# Patient Record
Sex: Female | Born: 1975 | Hispanic: Yes | Smoking: Never smoker
Health system: Southern US, Community
[De-identification: ages and names within clinical notes are randomized; demographics above are authoritative.]

## PROBLEM LIST (undated history)

## (undated) DIAGNOSIS — K219 Gastro-esophageal reflux disease without esophagitis: Secondary | ICD-10-CM

## (undated) DIAGNOSIS — J189 Pneumonia, unspecified organism: Secondary | ICD-10-CM

## (undated) DIAGNOSIS — G4730 Sleep apnea, unspecified: Secondary | ICD-10-CM

## (undated) DIAGNOSIS — J45909 Unspecified asthma, uncomplicated: Secondary | ICD-10-CM

## (undated) DIAGNOSIS — N289 Disorder of kidney and ureter, unspecified: Secondary | ICD-10-CM

## (undated) DIAGNOSIS — S060XAA Concussion with loss of consciousness status unknown, initial encounter: Secondary | ICD-10-CM

## (undated) DIAGNOSIS — Z8489 Family history of other specified conditions: Secondary | ICD-10-CM

## (undated) DIAGNOSIS — S060X9A Concussion with loss of consciousness of unspecified duration, initial encounter: Secondary | ICD-10-CM

## (undated) DIAGNOSIS — F32A Depression, unspecified: Secondary | ICD-10-CM

## (undated) DIAGNOSIS — Z992 Dependence on renal dialysis: Secondary | ICD-10-CM

## (undated) DIAGNOSIS — J449 Chronic obstructive pulmonary disease, unspecified: Secondary | ICD-10-CM

## (undated) DIAGNOSIS — R002 Palpitations: Secondary | ICD-10-CM

## (undated) DIAGNOSIS — T8859XA Other complications of anesthesia, initial encounter: Secondary | ICD-10-CM

## (undated) DIAGNOSIS — I10 Essential (primary) hypertension: Secondary | ICD-10-CM

## (undated) DIAGNOSIS — R519 Headache, unspecified: Secondary | ICD-10-CM

## (undated) DIAGNOSIS — E119 Type 2 diabetes mellitus without complications: Secondary | ICD-10-CM

## (undated) DIAGNOSIS — D649 Anemia, unspecified: Secondary | ICD-10-CM

## (undated) HISTORY — PX: CHOLECYSTECTOMY: SHX55

---

## 2012-01-15 DIAGNOSIS — G43909 Migraine, unspecified, not intractable, without status migrainosus: Secondary | ICD-10-CM | POA: Insufficient documentation

## 2012-02-15 DIAGNOSIS — N200 Calculus of kidney: Secondary | ICD-10-CM | POA: Diagnosis present

## 2012-02-16 DIAGNOSIS — G4730 Sleep apnea, unspecified: Secondary | ICD-10-CM | POA: Insufficient documentation

## 2012-12-26 DIAGNOSIS — N390 Urinary tract infection, site not specified: Secondary | ICD-10-CM | POA: Insufficient documentation

## 2016-02-16 DIAGNOSIS — F331 Major depressive disorder, recurrent, moderate: Secondary | ICD-10-CM | POA: Insufficient documentation

## 2016-09-25 DIAGNOSIS — T7411XA Adult physical abuse, confirmed, initial encounter: Secondary | ICD-10-CM | POA: Insufficient documentation

## 2017-03-15 DIAGNOSIS — E1122 Type 2 diabetes mellitus with diabetic chronic kidney disease: Secondary | ICD-10-CM

## 2017-03-15 DIAGNOSIS — E781 Pure hyperglyceridemia: Secondary | ICD-10-CM | POA: Insufficient documentation

## 2019-12-08 ENCOUNTER — Other Ambulatory Visit: Payer: Self-pay

## 2019-12-08 ENCOUNTER — Emergency Department: Payer: Self-pay

## 2019-12-08 DIAGNOSIS — E1165 Type 2 diabetes mellitus with hyperglycemia: Secondary | ICD-10-CM | POA: Diagnosis present

## 2019-12-08 DIAGNOSIS — G44209 Tension-type headache, unspecified, not intractable: Secondary | ICD-10-CM | POA: Diagnosis present

## 2019-12-08 DIAGNOSIS — R600 Localized edema: Secondary | ICD-10-CM | POA: Diagnosis present

## 2019-12-08 DIAGNOSIS — E1122 Type 2 diabetes mellitus with diabetic chronic kidney disease: Secondary | ICD-10-CM | POA: Diagnosis present

## 2019-12-08 DIAGNOSIS — N179 Acute kidney failure, unspecified: Principal | ICD-10-CM | POA: Diagnosis present

## 2019-12-08 DIAGNOSIS — E872 Acidosis: Secondary | ICD-10-CM | POA: Diagnosis not present

## 2019-12-08 DIAGNOSIS — N1832 Chronic kidney disease, stage 3b: Secondary | ICD-10-CM | POA: Diagnosis present

## 2019-12-08 DIAGNOSIS — Z87442 Personal history of urinary calculi: Secondary | ICD-10-CM

## 2019-12-08 DIAGNOSIS — Z794 Long term (current) use of insulin: Secondary | ICD-10-CM

## 2019-12-08 DIAGNOSIS — E875 Hyperkalemia: Secondary | ICD-10-CM | POA: Diagnosis not present

## 2019-12-08 DIAGNOSIS — J45909 Unspecified asthma, uncomplicated: Secondary | ICD-10-CM | POA: Diagnosis present

## 2019-12-08 DIAGNOSIS — K219 Gastro-esophageal reflux disease without esophagitis: Secondary | ICD-10-CM | POA: Diagnosis present

## 2019-12-08 DIAGNOSIS — Z8249 Family history of ischemic heart disease and other diseases of the circulatory system: Secondary | ICD-10-CM

## 2019-12-08 DIAGNOSIS — Z9114 Patient's other noncompliance with medication regimen: Secondary | ICD-10-CM

## 2019-12-08 DIAGNOSIS — Z79899 Other long term (current) drug therapy: Secondary | ICD-10-CM

## 2019-12-08 DIAGNOSIS — Z9049 Acquired absence of other specified parts of digestive tract: Secondary | ICD-10-CM

## 2019-12-08 DIAGNOSIS — Z888 Allergy status to other drugs, medicaments and biological substances status: Secondary | ICD-10-CM

## 2019-12-08 DIAGNOSIS — I129 Hypertensive chronic kidney disease with stage 1 through stage 4 chronic kidney disease, or unspecified chronic kidney disease: Secondary | ICD-10-CM | POA: Diagnosis present

## 2019-12-08 DIAGNOSIS — Z841 Family history of disorders of kidney and ureter: Secondary | ICD-10-CM

## 2019-12-08 DIAGNOSIS — Z20822 Contact with and (suspected) exposure to covid-19: Secondary | ICD-10-CM | POA: Diagnosis present

## 2019-12-08 DIAGNOSIS — I160 Hypertensive urgency: Secondary | ICD-10-CM | POA: Diagnosis present

## 2019-12-08 DIAGNOSIS — Z6841 Body Mass Index (BMI) 40.0 and over, adult: Secondary | ICD-10-CM

## 2019-12-08 LAB — COMPREHENSIVE METABOLIC PANEL
ALT: 10 U/L (ref 0–44)
AST: 13 U/L — ABNORMAL LOW (ref 15–41)
Albumin: 2.5 g/dL — ABNORMAL LOW (ref 3.5–5.0)
Alkaline Phosphatase: 102 U/L (ref 38–126)
Anion gap: 6 (ref 5–15)
BUN: 22 mg/dL — ABNORMAL HIGH (ref 6–20)
CO2: 17 mmol/L — ABNORMAL LOW (ref 22–32)
Calcium: 7.7 mg/dL — ABNORMAL LOW (ref 8.9–10.3)
Chloride: 111 mmol/L (ref 98–111)
Creatinine, Ser: 3.26 mg/dL — ABNORMAL HIGH (ref 0.44–1.00)
GFR calc Af Amer: 19 mL/min — ABNORMAL LOW (ref >60)
GFR calc non Af Amer: 16 mL/min — ABNORMAL LOW (ref >60)
Glucose, Bld: 251 mg/dL — ABNORMAL HIGH (ref 70–99)
Potassium: 5 mmol/L (ref 3.5–5.1)
Sodium: 134 mmol/L — ABNORMAL LOW (ref 135–145)
Total Bilirubin: 0.7 mg/dL (ref 0.3–1.2)
Total Protein: 5.5 g/dL — ABNORMAL LOW (ref 6.5–8.1)

## 2019-12-08 LAB — CBC
HCT: 35.1 % — ABNORMAL LOW (ref 36.0–46.0)
Hemoglobin: 11.5 g/dL — ABNORMAL LOW (ref 12.0–15.0)
MCH: 28.3 pg (ref 26.0–34.0)
MCHC: 32.8 g/dL (ref 30.0–36.0)
MCV: 86.2 fL (ref 80.0–100.0)
Platelets: 320 10*3/uL (ref 150–400)
RBC: 4.07 MIL/uL (ref 3.87–5.11)
RDW: 12.9 % (ref 11.5–15.5)
WBC: 10.2 10*3/uL (ref 4.0–10.5)
nRBC: 0 % (ref 0.0–0.2)

## 2019-12-08 LAB — TROPONIN I (HIGH SENSITIVITY): Troponin I (High Sensitivity): 8 ng/L (ref <18)

## 2019-12-08 MED ORDER — ACETAMINOPHEN 325 MG PO TABS
ORAL_TABLET | ORAL | Status: AC
  Filled 2019-12-08: qty 2

## 2019-12-08 MED ORDER — ACETAMINOPHEN 325 MG PO TABS
650.0000 mg | ORAL_TABLET | Freq: Once | ORAL | Status: AC
  Administered 2019-12-08: 650 mg via ORAL

## 2019-12-08 NOTE — ED Triage Notes (Signed)
Pt states that she has been out of her medicine for the past 2-3 week, headache and vomiting, reports that the vomiting has gotten worse in the past 4 days due to her father dying

## 2019-12-09 ENCOUNTER — Inpatient Hospital Stay
Admission: EM | Admit: 2019-12-09 | Discharge: 2019-12-13 | DRG: 683 | Disposition: A | Discharge status: Home Health | Payer: Self-pay | Attending: Internal Medicine | Admitting: Internal Medicine

## 2019-12-09 ENCOUNTER — Encounter: Payer: Self-pay | Admitting: Emergency Medicine

## 2019-12-09 DIAGNOSIS — R079 Chest pain, unspecified: Secondary | ICD-10-CM | POA: Diagnosis present

## 2019-12-09 DIAGNOSIS — E66813 Obesity, class 3: Secondary | ICD-10-CM | POA: Diagnosis present

## 2019-12-09 DIAGNOSIS — N200 Calculus of kidney: Secondary | ICD-10-CM | POA: Diagnosis present

## 2019-12-09 DIAGNOSIS — I160 Hypertensive urgency: Secondary | ICD-10-CM | POA: Diagnosis present

## 2019-12-09 DIAGNOSIS — N179 Acute kidney failure, unspecified: Secondary | ICD-10-CM

## 2019-12-09 DIAGNOSIS — E1169 Type 2 diabetes mellitus with other specified complication: Secondary | ICD-10-CM

## 2019-12-09 DIAGNOSIS — E1122 Type 2 diabetes mellitus with diabetic chronic kidney disease: Secondary | ICD-10-CM

## 2019-12-09 DIAGNOSIS — R1110 Vomiting, unspecified: Secondary | ICD-10-CM

## 2019-12-09 HISTORY — DX: Unspecified asthma, uncomplicated: J45.909

## 2019-12-09 HISTORY — DX: Essential (primary) hypertension: I10

## 2019-12-09 HISTORY — DX: Type 2 diabetes mellitus without complications: E11.9

## 2019-12-09 LAB — SARS CORONAVIRUS 2 BY RT PCR (HOSPITAL ORDER, PERFORMED IN ~~LOC~~ HOSPITAL LAB): SARS Coronavirus 2: NEGATIVE

## 2019-12-09 LAB — GLUCOSE, CAPILLARY: Glucose-Capillary: 181 mg/dL — ABNORMAL HIGH (ref 70–99)

## 2019-12-09 LAB — TROPONIN I (HIGH SENSITIVITY): Troponin I (High Sensitivity): 7 ng/L (ref <18)

## 2019-12-09 MED ORDER — INSULIN ASPART 100 UNIT/ML ~~LOC~~ SOLN
0.0000 [IU] | Freq: Three times a day (TID) | SUBCUTANEOUS | Status: DC
  Administered 2019-12-10 (×3): 4 [IU] via SUBCUTANEOUS
  Administered 2019-12-11: 11 [IU] via SUBCUTANEOUS
  Administered 2019-12-11 (×2): 7 [IU] via SUBCUTANEOUS
  Administered 2019-12-12: 4 [IU] via SUBCUTANEOUS
  Administered 2019-12-12: 11 [IU] via SUBCUTANEOUS
  Administered 2019-12-12: 7 [IU] via SUBCUTANEOUS
  Filled 2019-12-09 (×9): qty 1

## 2019-12-09 MED ORDER — HYDRALAZINE HCL 20 MG/ML IJ SOLN
10.0000 mg | Freq: Four times a day (QID) | INTRAMUSCULAR | Status: DC | PRN
  Administered 2019-12-09 – 2019-12-11 (×4): 10 mg via INTRAVENOUS
  Filled 2019-12-09 (×3): qty 0.5
  Filled 2019-12-09: qty 1
  Filled 2019-12-09 (×2): qty 0.5

## 2019-12-09 MED ORDER — SODIUM CHLORIDE 0.9% FLUSH: 3.0000 mL | INTRAVENOUS | Status: DC | PRN

## 2019-12-09 MED ORDER — ONDANSETRON HCL 4 MG/2ML IJ SOLN
4.0000 mg | Freq: Four times a day (QID) | INTRAMUSCULAR | Status: DC | PRN
  Administered 2019-12-10 (×2): 4 mg via INTRAVENOUS
  Filled 2019-12-09 (×2): qty 2

## 2019-12-09 MED ORDER — SODIUM CHLORIDE 0.9 % IV SOLN: 250.0000 mL | INTRAVENOUS | Status: DC | PRN

## 2019-12-09 MED ORDER — LORAZEPAM 2 MG/ML IJ SOLN
1.0000 mg | Freq: Once | INTRAMUSCULAR | Status: AC
  Administered 2019-12-09: 1 mg via INTRAVENOUS
  Filled 2019-12-09: qty 1

## 2019-12-09 MED ORDER — ACETAMINOPHEN 500 MG PO TABS
1000.0000 mg | ORAL_TABLET | ORAL | Status: DC | PRN
  Administered 2019-12-09 – 2019-12-11 (×7): 1000 mg via ORAL
  Filled 2019-12-09 (×8): qty 2

## 2019-12-09 MED ORDER — LACTATED RINGERS IV BOLUS
1000.0000 mL | Freq: Once | INTRAVENOUS | Status: AC
  Administered 2019-12-09: 1000 mL via INTRAVENOUS

## 2019-12-09 MED ORDER — SODIUM CHLORIDE 0.9% FLUSH
3.0000 mL | Freq: Two times a day (BID) | INTRAVENOUS | Status: DC
  Administered 2019-12-10 – 2019-12-12 (×5): 3 mL via INTRAVENOUS

## 2019-12-09 MED ORDER — AMLODIPINE BESYLATE 10 MG PO TABS
10.0000 mg | ORAL_TABLET | Freq: Every day | ORAL | Status: DC
  Administered 2019-12-09 – 2019-12-13 (×5): 10 mg via ORAL
  Filled 2019-12-09: qty 1
  Filled 2019-12-09: qty 2
  Filled 2019-12-09 (×3): qty 1

## 2019-12-09 MED ORDER — ALBUTEROL SULFATE (2.5 MG/3ML) 0.083% IN NEBU
2.5000 mg | INHALATION_SOLUTION | Freq: Once | RESPIRATORY_TRACT | Status: AC
  Administered 2019-12-09: 2.5 mg via RESPIRATORY_TRACT
  Filled 2019-12-09: qty 3

## 2019-12-09 MED ORDER — ONDANSETRON HCL 4 MG PO TABS: 4.0000 mg | ORAL_TABLET | Freq: Four times a day (QID) | ORAL | Status: DC | PRN

## 2019-12-09 MED ORDER — ENOXAPARIN SODIUM 30 MG/0.3ML ~~LOC~~ SOLN
30.0000 mg | SUBCUTANEOUS | Status: DC
  Filled 2019-12-09 (×2): qty 0.3

## 2019-12-09 MED ORDER — PANTOPRAZOLE SODIUM 40 MG IV SOLR
40.0000 mg | INTRAVENOUS | Status: DC
  Administered 2019-12-09 – 2019-12-12 (×4): 40 mg via INTRAVENOUS
  Filled 2019-12-09 (×4): qty 40

## 2019-12-09 MED ORDER — SODIUM CHLORIDE 0.45 % IV SOLN: INTRAVENOUS | Status: DC

## 2019-12-09 NOTE — ED Notes (Signed)
Patient given apple juice

## 2019-12-09 NOTE — Plan of Care (Signed)

## 2019-12-09 NOTE — H&P (Signed)
History and Physical    [name] OZH:[id number] DOB: December 12, 1975 DOA: 12/09/2019  PCP: Patient, No Pcp Per   Patient coming from: Home  I have personally briefly reviewed patient's old medical records in [place]  Chief Complaint: Headache/chest pain/nausea/vomiting  HPI: [name] is a 44 y.o. female with medical history significant for diabetes mellitus with chronic kidney disease, morbid obesity, diabetes mellitus, hypertension and asthma who presents to the emergency room for evaluation of headache and vomiting which she has had according to her for months.  She describes the headache as an intense pain over the temporal and frontal part of her skull and radiating to the eyes which she rates a 6 x 10 in intensity at its worst. She denies having a history of migraine headaches and states that the pain has improved since she came into the emergency room..  She has not had her vision checked.  Patient admits to a lot of stress at home and has not been taking any of her medications.  She also complains of burning sensation in her chest associated with nausea and multiple episodes of vomiting.  She denies having any abdominal pain or any changes in her bowel habits. She has no fever, no chills, no shortness of breath, no cough, no urinary symptoms. Upon arrival to the ER she was noted to have significantly elevated blood pressure 168/107. Labs reveal a serum creatinine of 3.26 compared to baseline of 1.9 in January 2021, serum glucose of 251, bicarbonate level of 17. CT scan of the head done without contrast shows no acute findings Twelve-lead EKG read by me shows normal sinus rhythm, low voltage QRS and left axis deviation Chest x-ray read by me shows no infiltrate or effusion.    ED Course: Patient is a 44 year old Hispanic female who presents to the emergency room with multiple complaints but will be admitted to the hospitalist service for acute kidney injury.  Patient at  baseline has a serum creatinine of 1.9 but today on admission it is 3.26. She will be admitted to the hospital for further evaluation.  Review of Systems: As per HPI otherwise 10 point review of systems negative.    Past Medical History:  Diagnosis Date  . Asthma   . Diabetes ([place])   . Hypertension     History reviewed. No pertinent surgical history.   has no history on file for tobacco use, alcohol use, and drug use.  Allergies  Allergen Reactions  . Cholesterol Rash    No family history on file.   Prior to Admission medications   Not on File    Physical Exam: Vitals:   12/08/19 2213 12/09/19 0359 12/09/19 0626 12/09/19 0949  BP: (!) 184/122 (!) 209/103 113/73 (!) 165/106  Pulse: 86 77 64 85  Resp: 18 18 18 16   Temp: 98.6 F (37 C) 98.6 F (37 C) 98.5 F (36.9 C)   TempSrc: Oral Oral Oral   SpO2: 100% 100% 100% 100%  Weight:      Height:         Vitals:   12/08/19 2213 12/09/19 0359 12/09/19 0626 12/09/19 0949  BP: (!) 184/122 (!) 209/103 113/73 (!) 165/106  Pulse: 86 77 64 85  Resp: 18 18 18 16   Temp: 98.6 F (37 C) 98.6 F (37 C) 98.5 F (36.9 C)   TempSrc: Oral Oral Oral   SpO2: 100% 100% 100% 100%  Weight:      Height:  Constitutional: NAD, alert and oriented x 3.  Morbidly obese  Eyes: PERRL, lids and conjunctivae normal ENMT: Mucous membranes are moist.  Neck: normal, supple, no masses, no thyromegaly Respiratory: clear to auscultation bilaterally, no wheezing, no crackles. Normal respiratory effort. No accessory muscle use.  Cardiovascular: Regular rate and rhythm, no murmurs / rubs / gallops. No extremity edema. 2+ pedal pulses. No carotid bruits.  Abdomen: no tenderness, no masses palpated. No hepatosplenomegaly. Bowel sounds positive. Central adiposity Musculoskeletal: no clubbing / cyanosis. No joint deformity upper and lower extremities.  Skin: no rashes, lesions, ulcers.  Neurologic: No gross focal neurologic  deficit. Psychiatric: Normal mood and affect.   Labs on Admission: I have personally reviewed following labs and imaging studies  CBC: Recent Labs  Lab 12/08/19 1905  WBC 10.2  HGB 11.5*  HCT 35.1*  MCV 86.2  PLT 130   Basic Metabolic Panel: Recent Labs  Lab 12/08/19 1905  NA 134*  K 5.0  CL 111  CO2 17*  GLUCOSE 251*  BUN 22*  CREATININE 3.26*  CALCIUM 7.7*   GFR: Estimated Creatinine Clearance: 28.8 mL/min (A) (by C-G formula based on SCr of 3.26 mg/dL (H)). Liver Function Tests: Recent Labs  Lab 12/08/19 1905  AST 13*  ALT 10  ALKPHOS 102  BILITOT 0.7  PROT 5.5*  ALBUMIN 2.5*   No results for input(s): LIPASE, AMYLASE in the last 168 hours. No results for input(s): AMMONIA in the last 168 hours. Coagulation Profile: No results for input(s): INR, PROTIME in the last 168 hours. Cardiac Enzymes: No results for input(s): CKTOTAL, CKMB, CKMBINDEX, TROPONINI in the last 168 hours. BNP (last 3 results) No results for input(s): PROBNP in the last 8760 hours. HbA1C: No results for input(s): HGBA1C in the last 72 hours. CBG: No results for input(s): GLUCAP in the last 168 hours. Lipid Profile: No results for input(s): CHOL, HDL, LDLCALC, TRIG, CHOLHDL, LDLDIRECT in the last 72 hours. Thyroid Function Tests: No results for input(s): TSH, T4TOTAL, FREET4, T3FREE, THYROIDAB in the last 72 hours. Anemia Panel: No results for input(s): VITAMINB12, FOLATE, FERRITIN, TIBC, IRON, RETICCTPCT in the last 72 hours. Urine analysis: No results found for: COLORURINE, APPEARANCEUR, LABSPEC, [place], GLUCOSEU, HGBUR, BILIRUBINUR, KETONESUR, PROTEINUR, UROBILINOGEN, NITRITE, LEUKOCYTESUR  Radiological Exams on Admission: DG Chest 2 View  Result Date: 12/08/2019 CLINICAL DATA:  44 year old female with hypertension and headaches. EXAM: CHEST - 2 VIEW COMPARISON:  None. FINDINGS: No focal consolidation, pleural effusion, or pneumothorax. The cardiac silhouette is within limits.  No acute osseous pathology. IMPRESSION: No active cardiopulmonary disease. Electronically Signed   By: Anner Crete M.D.   On: 12/08/2019 23:53   CT Head Wo Contrast  Result Date: 12/08/2019 CLINICAL DATA:  44 year old female with dizziness. EXAM: CT HEAD WITHOUT CONTRAST TECHNIQUE: Contiguous axial images were obtained from the base of the skull through the vertex without intravenous contrast. COMPARISON:  None. FINDINGS: Brain: The ventricles and sulci appropriate size for patient's age. The gray-white matter discrimination is preserved. There is no acute intracranial hemorrhage. No mass effect or midline shift. No extra-axial fluid collection. Vascular: No hyperdense vessel or unexpected calcification. Skull: Normal. Negative for fracture or focal lesion. Sinuses/Orbits: No acute finding. Other: None IMPRESSION: Unremarkable noncontrast CT of the brain. Electronically Signed   By: Anner Crete M.D.   On: 12/08/2019 23:38    EKG: Independently reviewed.  Normal sinus rhythm Low voltage QRS Left axis deviation  Assessment/Plan Principal Problem:   AKI (acute kidney injury) ([place]) Active  Problems:   Obesity, Class III, BMI 40-49.9 (morbid obesity) ([place])   Diabetes ([place])   Hypertensive urgency   Chest pain     Acute kidney injury Patient with a history of diabetes mellitus and chronic kidney disease, baseline serum creatinine of 1.9 presents to the ER for evaluation of nausea, vomiting and chest pain and is noted to have a serum creatinine of 3.26 Worsening renal function appears to be prerenal and may be secondary to volume depletion We will hydrate patient Repeat renal parameters in a.m.   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 Patient has a baseline  serum creatinine of 1.9 but has been noncompliant with medication as well as her diet Place patient on a consistent carbohydrate diet Glycemic control with sliding scale insulin   Morbid obesity (BMI 56) Complicates overall prognosis and care   Chest pain Most likely atypical and could be related to GERD Obtain serial cardiac enzymes to rule out an acute coronary syndrome Place patient on IV Protonix Start patient on a clear liquid diet and advance as tolerated  DVT prophylaxis:  Lovenox Code Status: Full Family Communication: Greater than 50% of time was spent discussing plan of care with patient at the bedside.  All questions and concerns have been addressed.  She verbalizes understanding and agrees with the plan. Disposition Plan: Back to previous home environment Consults called: None    Glennon Kopko MD [facility]     12/09/2019, 10:24 AM

## 2019-12-09 NOTE — ED Notes (Signed)
MD notified about continued HTN, no new orders at this time.

## 2019-12-09 NOTE — ED Notes (Signed)
Pt up to check-in desk asking about medicine. This Probation officer explained that she would have to wait to go back to a treatment room and for a doctors order.

## 2019-12-09 NOTE — ED Provider Notes (Signed)
[facility] Emergency Department Provider Note   ____________________________________________   First MD Initiated Contact with Patient 12/09/19 0700     (approximate)  I have reviewed the triage vital signs and the nursing notes.   HISTORY  Chief Complaint Headache, Emesis, and Hypertension    HPI [name] is a 44 y.o. female with past medical history of hypertension, diabetes, and asthma who presents to the ED complaining of headache and vomiting.  Patient reports she has been dealing with an intermittent headache for the past couple of weeks which has been severe yesterday into today.  She describes it as diffuse and dull, not exacerbated or alleviated by anything in particular.  It has been gradually worsening in severity and she denies any vision changes, speech changes, numbness, or weakness.  She has not had any fevers or neck stiffness.  She does state that she developed sharp pain in the left side of her chest over the past 3 to 4 days with some increasing difficulty breathing.  She has been using her inhaler but this is not alleviated her symptoms and she denies any associated cough.  She has been nauseous with multiple episodes of vomiting, but denies abdominal pain or diarrhea.  She has not had any dysuria, hematuria, vaginal bleeding, or vaginal discharge.        Past Medical History:  Diagnosis Date  . Asthma   . Diabetes ([place])   . Hypertension     Patient Active Problem List   Diagnosis Date Noted  . AKI (acute kidney injury) ([place]) 12/09/2019    History reviewed. No pertinent surgical history.  Prior to Admission medications   Not on File    Allergies Cholesterol  No family history on file.  Social History Social History   Tobacco Use  . Smoking status: Not on file  Substance Use Topics  . Alcohol use: Not on file  . Drug use: Not on file    Review of Systems  Constitutional: No fever/chills Eyes: No visual  changes. ENT: No sore throat. Cardiovascular: Positive for chest pain. Respiratory: Positive for shortness of breath. Gastrointestinal: No abdominal pain.  Positive for nausea and vomiting.  No diarrhea.  No constipation. Genitourinary: Negative for dysuria. Musculoskeletal: Negative for back pain. Skin: Negative for rash. Neurological: Negative for headaches, focal weakness or numbness.  ____________________________________________   PHYSICAL EXAM:  VITAL SIGNS: ED Triage Vitals  Enc Vitals Group     BP 12/08/19 1846 (!) 168/107     Pulse Rate 12/08/19 1846 88     Resp 12/08/19 1846 18     Temp 12/08/19 1846 98.7 F (37.1 C)     Temp Source 12/08/19 1846 Oral     SpO2 12/08/19 1846 99 %     Weight 12/08/19 1900 (!) 298 lb (135.2 kg)     Height 12/08/19 1900 5\' 1"  (1.549 m)     Head Circumference --      Peak Flow --      Pain Score 12/08/19 1900 9     Pain Loc --      Pain Edu? --      Excl. in [place]? --     Constitutional: Alert and oriented. Eyes: Conjunctivae are normal. Head: Atraumatic. Nose: No congestion/rhinnorhea. Mouth/Throat: Mucous membranes are moist. Neck: Normal ROM Cardiovascular: Normal rate, regular rhythm. Grossly normal heart sounds. Respiratory: Normal respiratory effort.  No retractions. Lungs CTAB. Gastrointestinal: Soft and nontender. No distention. Genitourinary: deferred Musculoskeletal: No lower extremity  tenderness, 1+ pitting edema to bilateral lower extremities. Neurologic:  Normal speech and language. No gross focal neurologic deficits are appreciated. Skin:  Skin is warm, dry and intact. No rash noted. Psychiatric: Mood and affect are normal. Speech and behavior are normal.  ____________________________________________   LABS (all labs ordered are listed, but only abnormal results are displayed)  Labs Reviewed  CBC - Abnormal; Notable for the following components:      Result Value   Hemoglobin 11.5 (*)    HCT 35.1 (*)    All  other components within normal limits  COMPREHENSIVE METABOLIC PANEL - Abnormal; Notable for the following components:   Sodium 134 (*)    CO2 17 (*)    Glucose, Bld 251 (*)    BUN 22 (*)    Creatinine, Ser 3.26 (*)    Calcium 7.7 (*)    Total Protein 5.5 (*)    Albumin 2.5 (*)    AST 13 (*)    GFR calc non Af Amer 16 (*)    GFR calc Af Amer 19 (*)    All other components within normal limits  SARS CORONAVIRUS 2 BY RT PCR ([facility])  URINALYSIS, COMPLETE (UACMP) WITH MICROSCOPIC  POC URINE PREG, ED  TROPONIN I (HIGH SENSITIVITY)   ____________________________________________  EKG  ED ECG REPORT I, Blake Divine, the attending physician, personally viewed and interpreted this ECG.   Date: 12/09/2019  EKG Time: 18:47  Rate: 80  Rhythm: normal sinus rhythm  Axis: LAD  Intervals:none  ST&T Change: None   PROCEDURES  Procedure(s) performed (including Critical Care):  Procedures   ____________________________________________   INITIAL IMPRESSION / ASSESSMENT AND PLAN / ED COURSE       44 year old female with possible history of hypertension, diabetes, and asthma who presents to the ED complaining of a couple weeks of intermittent headache that has become more severe recently along with 3 to 4 days of chest pain, shortness of breath, and vomiting.  Headache is gradual in onset and CT head is negative for acute process, I do not suspect SAH or meningitis.  Chest pain seems unlikely to be related to ACS, EKG without arrhythmia or acute ischemic changes, troponin negative.  Chest x-ray is also unremarkable, no audible wheezing to suggest significant asthma exacerbation.  She has not had any fevers and denies any sick contacts, however she is not vaccinated against COVID-19 and we will need to test for this.  Lab work is remarkable for elevated creatinine and mild acidosis with unclear baseline renal function.  We will attempt to  obtain records from [facility], hydrate with IV fluids for now.  She is mildly hyperglycemic but no increase in anion gap to suggest DKA.  Patient hydrated with IV fluids, point-of-care urine pregnancy is negative and UA pending.  We will able to obtain records from [facility] that shows creatinine of 1.9 in January.  Patient's current AKI is likely multifactorial due to dehydration from vomiting, noncompliance with her antihypertensives, and poor diabetic control.  She was hydrated with IV fluids and case discussed with hospitalist for admission.      ____________________________________________   FINAL CLINICAL IMPRESSION(S) / ED DIAGNOSES  Final diagnoses:  AKI (acute kidney injury) [facility])     ED Discharge Orders    None       Note:  This document was prepared using Dragon voice recognition software and may include unintentional dictation errors.  Blake Divine, MD 12/09/19 [phone]

## 2019-12-09 NOTE — ED Notes (Signed)
MD notified of pts continued HTN despite medications, awaiting new orders, will continue to monitor.

## 2019-12-10 ENCOUNTER — Inpatient Hospital Stay: Payer: Self-pay

## 2019-12-10 ENCOUNTER — Encounter: Payer: Self-pay | Admitting: Internal Medicine

## 2019-12-10 ENCOUNTER — Other Ambulatory Visit: Payer: Self-pay

## 2019-12-10 DIAGNOSIS — E872 Acidosis: Secondary | ICD-10-CM

## 2019-12-10 DIAGNOSIS — N179 Acute kidney failure, unspecified: Principal | ICD-10-CM

## 2019-12-10 LAB — BASIC METABOLIC PANEL
Anion gap: 7 (ref 5–15)
BUN: 17 mg/dL (ref 6–20)
CO2: 16 mmol/L — ABNORMAL LOW (ref 22–32)
Calcium: 7.9 mg/dL — ABNORMAL LOW (ref 8.9–10.3)
Chloride: 113 mmol/L — ABNORMAL HIGH (ref 98–111)
Creatinine, Ser: 3.03 mg/dL — ABNORMAL HIGH (ref 0.44–1.00)
GFR calc Af Amer: 21 mL/min — ABNORMAL LOW (ref >60)
GFR calc non Af Amer: 18 mL/min — ABNORMAL LOW (ref >60)
Glucose, Bld: 172 mg/dL — ABNORMAL HIGH (ref 70–99)
Potassium: 5 mmol/L (ref 3.5–5.1)
Sodium: 136 mmol/L (ref 135–145)

## 2019-12-10 LAB — GLUCOSE, CAPILLARY
Glucose-Capillary: 168 mg/dL — ABNORMAL HIGH (ref 70–99)
Glucose-Capillary: 166 mg/dL — ABNORMAL HIGH (ref 70–99)
Glucose-Capillary: 181 mg/dL — ABNORMAL HIGH (ref 70–99)
Glucose-Capillary: 190 mg/dL — ABNORMAL HIGH (ref 70–99)
Glucose-Capillary: 285 mg/dL — ABNORMAL HIGH (ref 70–99)

## 2019-12-10 LAB — CBC
HCT: 35 % — ABNORMAL LOW (ref 36.0–46.0)
Hemoglobin: 11.4 g/dL — ABNORMAL LOW (ref 12.0–15.0)
MCH: 28.4 pg (ref 26.0–34.0)
MCHC: 32.6 g/dL (ref 30.0–36.0)
MCV: 87.3 fL (ref 80.0–100.0)
Platelets: 362 10*3/uL (ref 150–400)
RBC: 4.01 MIL/uL (ref 3.87–5.11)
RDW: 13 % (ref 11.5–15.5)
WBC: 9.1 10*3/uL (ref 4.0–10.5)
nRBC: 0 % (ref 0.0–0.2)

## 2019-12-10 LAB — HEMOGLOBIN A1C
Hgb A1c MFr Bld: 10.1 % — ABNORMAL HIGH (ref 4.8–5.6)
Mean Plasma Glucose: 243 mg/dL

## 2019-12-10 LAB — TROPONIN I (HIGH SENSITIVITY): Troponin I (High Sensitivity): 7 ng/L (ref <18)

## 2019-12-10 LAB — HIV ANTIBODY (ROUTINE TESTING W REFLEX): HIV Screen 4th Generation wRfx: NONREACTIVE

## 2019-12-10 MED ORDER — DEXAMETHASONE SODIUM PHOSPHATE 10 MG/ML IJ SOLN
6.0000 mg | Freq: Once | INTRAMUSCULAR | Status: AC
  Administered 2019-12-10: 6 mg via INTRAVENOUS
  Filled 2019-12-10: qty 0.6

## 2019-12-10 MED ORDER — ENOXAPARIN SODIUM 40 MG/0.4ML ~~LOC~~ SOLN
40.0000 mg | Freq: Two times a day (BID) | SUBCUTANEOUS | Status: DC
  Administered 2019-12-10 – 2019-12-12 (×5): 40 mg via SUBCUTANEOUS
  Filled 2019-12-10 (×5): qty 0.4

## 2019-12-10 MED ORDER — MORPHINE SULFATE (PF) 2 MG/ML IV SOLN
1.0000 mg | INTRAVENOUS | Status: DC | PRN
  Administered 2019-12-10 (×2): 1 mg via INTRAVENOUS
  Filled 2019-12-10 (×2): qty 1

## 2019-12-10 MED ORDER — OXYCODONE HCL 5 MG PO TABS
5.0000 mg | ORAL_TABLET | Freq: Four times a day (QID) | ORAL | Status: DC | PRN
  Administered 2019-12-11 – 2019-12-13 (×4): 5 mg via ORAL
  Filled 2019-12-10 (×4): qty 1

## 2019-12-10 MED ORDER — MAGNESIUM SULFATE 2 GM/50ML IV SOLN
2.0000 g | Freq: Once | INTRAVENOUS | Status: DC
  Filled 2019-12-10: qty 50

## 2019-12-10 MED ORDER — LORAZEPAM 2 MG/ML IJ SOLN: 1.0000 mg | Freq: Once | INTRAMUSCULAR | Status: AC

## 2019-12-10 MED ORDER — LIVING WELL WITH DIABETES BOOK
Freq: Once | Status: AC
  Filled 2019-12-10: qty 1

## 2019-12-10 MED ORDER — TOPIRAMATE 25 MG PO TABS
25.0000 mg | ORAL_TABLET | Freq: Every day | ORAL | Status: DC
  Administered 2019-12-10: 25 mg via ORAL
  Filled 2019-12-10: qty 1

## 2019-12-10 MED ORDER — SODIUM CHLORIDE 0.9 % IV SOLN
2.0000 g | Freq: Once | INTRAVENOUS | Status: AC
  Administered 2019-12-10: 2 g via INTRAVENOUS
  Filled 2019-12-10: qty 4

## 2019-12-10 MED ORDER — MORPHINE SULFATE (PF) 2 MG/ML IV SOLN
2.0000 mg | INTRAVENOUS | Status: DC | PRN
  Administered 2019-12-10: 2 mg via INTRAVENOUS
  Filled 2019-12-10: qty 1

## 2019-12-10 MED ORDER — HYDRALAZINE HCL 25 MG PO TABS
25.0000 mg | ORAL_TABLET | Freq: Three times a day (TID) | ORAL | Status: DC
  Administered 2019-12-10 – 2019-12-13 (×9): 25 mg via ORAL
  Filled 2019-12-10 (×9): qty 1

## 2019-12-10 MED ORDER — LORAZEPAM 2 MG/ML IJ SOLN
INTRAMUSCULAR | Status: AC
  Administered 2019-12-11: 1 mg via INTRAVENOUS
  Filled 2019-12-10: qty 1

## 2019-12-10 NOTE — Progress Notes (Signed)
PROGRESS NOTE    [name]  TKZ:[id number] DOB: [birth year] DOA: 12/09/2019 PCP: Patient, No Pcp Per    Brief Narrative:  [name] is a 44 y.o. female with medical history significant for diabetes mellitus with chronic kidney disease, morbid obesity, diabetes mellitus, hypertension and asthma who presents to the emergency room for evaluation of headache and vomiting which she has had according to her for months.  She describes the headache as an intense pain over the temporal and frontal part of her skull and radiating to the eyes which she rates a 6 x 10 in intensity at its worst. She denies having a history of migraine headaches and states that the pain has improved since she came into the emergency room..  She has not had her vision checked.  Patient admits to a lot of stress at home and has not been taking any of her medications.  She also complains of burning sensation in her chest associated with nausea and multiple episodes of vomiting.  She denies having any abdominal pain or any changes in her bowel habits. She has no fever, no chills, no shortness of breath, no cough, no urinary symptoms. Upon arrival to the ER she was noted to have significantly elevated blood pressure 168/107. Labs reveal a serum creatinine of 3.26 compared to baseline of 1.9 in January 2021, serum glucose of 251, bicarbonate level of 17. CT scan of the head done without contrast shows no acute findings   Consultants:   neurology  Procedures:   Antimicrobials:       Subjective: C/o HA.. states its like migraine. Given topomax pt vomited . No abd pain or nausea  Objective: Vitals:   12/10/19 0041 12/10/19 0631 12/10/19 0639 12/10/19 0818  BP: (!) 153/92 (!) 188/104 (!) 191/118 (!) 137/73  Pulse: 94 88 87 98  Resp: 18 18  18   Temp: 98.2 F (36.8 C) 98.2 F (36.8 C)  98.5 F (36.9 C)  TempSrc: Oral Oral  Oral  SpO2: 99% 100% 100% 100%  Weight:      Height:        Intake/Output Summary  (Last 24 hours) at 12/10/2019 1429 Last data filed at 12/10/2019 1010 Gross per 24 hour  Intake 1723.61 ml  Output 1150 ml  Net 573.61 ml   Filed Weights   12/08/19 1900 12/09/19 2141  Weight: (!) 135.2 kg (!) 140 kg    Examination:  General exam: Appears calm , holding forehead. nad Respiratory system: Clear to auscultation. Respiratory effort normal. Cardiovascular system: S1 & S2 heard, RRR. No JVD, murmurs, rubs, gallops or clicks.  Gastrointestinal system: Abdomen is nondistended, soft and nontender.  Normal bowel sounds heard. Central nervous system: Alert and oriented. Grossly intact Extremities: no edema Skin: Warm dry  Psychiatry: Judgement and insight appear normal. Mood & affect appropriate.     Data Reviewed: I have personally reviewed following labs and imaging studies  CBC: Recent Labs  Lab 12/08/19 1905 12/10/19 0505  WBC 10.2 9.1  HGB 11.5* 11.4*  HCT 35.1* 35.0*  MCV 86.2 87.3  PLT 320 573   Basic Metabolic Panel: Recent Labs  Lab 12/08/19 1905 12/10/19 0505  NA 134* 136  K 5.0 5.0  CL 111 113*  CO2 17* 16*  GLUCOSE 251* 172*  BUN 22* 17  CREATININE 3.26* 3.03*  CALCIUM 7.7* 7.9*   GFR: Estimated Creatinine Clearance: 31.7 mL/min (A) (by C-G formula based on SCr of 3.03 mg/dL (H)). Liver Function Tests: Recent Labs  Lab 12/08/19 1905  AST 13*  ALT 10  ALKPHOS 102  BILITOT 0.7  PROT 5.5*  ALBUMIN 2.5*   No results for input(s): LIPASE, AMYLASE in the last 168 hours. No results for input(s): AMMONIA in the last 168 hours. Coagulation Profile: No results for input(s): INR, PROTIME in the last 168 hours. Cardiac Enzymes: No results for input(s): CKTOTAL, CKMB, CKMBINDEX, TROPONINI in the last 168 hours. BNP (last 3 results) No results for input(s): PROBNP in the last 8760 hours. HbA1C: Recent Labs    12/09/19 1153  HGBA1C 10.1*   CBG: Recent Labs  Lab 12/09/19 2012 12/09/19 2203 12/10/19 0816 12/10/19 1158  GLUCAP 181*  168* 166* 181*   Lipid Profile: No results for input(s): CHOL, HDL, LDLCALC, TRIG, CHOLHDL, LDLDIRECT in the last 72 hours. Thyroid Function Tests: No results for input(s): TSH, T4TOTAL, FREET4, T3FREE, THYROIDAB in the last 72 hours. Anemia Panel: No results for input(s): VITAMINB12, FOLATE, FERRITIN, TIBC, IRON, RETICCTPCT in the last 72 hours. Sepsis Labs: No results for input(s): PROCALCITON, LATICACIDVEN in the last 168 hours.  Recent Results (from the past 240 hour(s))  SARS Coronavirus 2 by RT PCR (hospital order, performed in [facility] hospital lab) Nasopharyngeal Nasopharyngeal Swab     Status: None   Collection Time: 12/09/19  7:54 AM   Specimen: Nasopharyngeal Swab  Result Value Ref Range Status   SARS Coronavirus 2 NEGATIVE NEGATIVE Final    Comment: (NOTE) SARS-CoV-2 target nucleic acids are NOT DETECTED.  The SARS-CoV-2 RNA is generally detectable in upper and lower respiratory specimens during the acute phase of infection. The lowest concentration of SARS-CoV-2 viral copies this assay can detect is 250 copies / mL. A negative result does not preclude SARS-CoV-2 infection and should not be used as the sole basis for treatment or other patient management decisions.  A negative result may occur with improper specimen collection / handling, submission of specimen other than nasopharyngeal swab, presence of viral mutation(s) within the areas targeted by this assay, and inadequate number of viral copies (<250 copies / mL). A negative result must be combined with clinical observations, patient history, and epidemiological information.  Fact Sheet for Patients:   StrictlyIdeas.no  Fact Sheet for Healthcare Providers: BankingDealers.co.za  This test is not yet approved or  cleared by the [place] FDA and has been authorized for detection and/or diagnosis of SARS-CoV-2 by FDA under an Emergency Use Authorization (EUA).   This EUA will remain in effect (meaning this test can be used) for the duration of the COVID-19 declaration under Section 564(b)(1) of the Act, 21 U.S.C. section 360bbb-3(b)(1), unless the authorization is terminated or revoked sooner.  Performed at [facility], [address], [place], [place] [address]          Radiology Studies: DG Chest 2 View  Result Date: 12/08/2019 CLINICAL DATA:  44 year old female with hypertension and headaches. EXAM: CHEST - 2 VIEW COMPARISON:  None. FINDINGS: No focal consolidation, pleural effusion, or pneumothorax. The cardiac silhouette is within limits. No acute osseous pathology. IMPRESSION: No active cardiopulmonary disease. Electronically Signed   By: Anner Crete M.D.   On: 12/08/2019 23:53   CT Head Wo Contrast  Result Date: 12/08/2019 CLINICAL DATA:  44 year old female with dizziness. EXAM: CT HEAD WITHOUT CONTRAST TECHNIQUE: Contiguous axial images were obtained from the base of the skull through the vertex without intravenous contrast. COMPARISON:  None. FINDINGS: Brain: The ventricles and sulci appropriate size for patient's age. The gray-white matter discrimination  is preserved. There is no acute intracranial hemorrhage. No mass effect or midline shift. No extra-axial fluid collection. Vascular: No hyperdense vessel or unexpected calcification. Skull: Normal. Negative for fracture or focal lesion. Sinuses/Orbits: No acute finding. Other: None IMPRESSION: Unremarkable noncontrast CT of the brain. Electronically Signed   By: Anner Crete M.D.   On: 12/08/2019 23:38   US RENAL  Result Date: 12/10/2019 CLINICAL DATA:  Acute renal insufficiency. EXAM: RENAL / URINARY TRACT ULTRASOUND COMPLETE COMPARISON:  None. FINDINGS: Right Kidney: Renal measurements: 10.4 x 4.8 x 5.6 cm = volume: 145.9 mL . Normal renal cortical thickness and echogenicity without focal lesions or hydronephrosis. Left Kidney: Renal measurements: 11.7 x 5.1 x 5.3 cm  = volume: 166.5 mL. Normal renal cortical thickness and echogenicity without focal lesions or hydronephrosis. Bladder: Appears normal for degree of bladder distention. Other: The exam is somewhat limited by body habitus. IMPRESSION: 1. Unremarkable renal ultrasound examination. No renal lesions or hydronephrosis. 2. Poorly distended bladder but no abnormality. Electronically Signed   By: Marijo Sanes M.D.   On: 12/10/2019 12:24        Scheduled Meds: . amLODipine  10 mg Oral Daily  . dexamethasone (DECADRON) injection  6 mg Intravenous Once  . enoxaparin (LOVENOX) injection  40 mg Subcutaneous Q12H  . insulin aspart  0-20 Units Subcutaneous TID WC  . living well with diabetes book   Does not apply Once  . pantoprazole (PROTONIX) IV  40 mg Intravenous Q24H  . sodium chloride flush  3 mL Intravenous Q12H  . topiramate  25 mg Oral Daily   Continuous Infusions: . sodium chloride 150 mL/hr at 12/10/19 0956  . sodium chloride    . magnesium sulfate bolus IVPB      Assessment & Plan:   Principal Problem:   AKI (acute kidney injury) ([place]) Active Problems:   Obesity, Class III, BMI 40-49.9 (morbid obesity) ([place])   Diabetes ([place])   Hypertensive urgency   Chest pain   Acute/CKI- on admission with creatinine 3.26 Patient with a history of diabetes mellitus and chronic kidney disease, baseline serum creatinine of 1.9  Worsening renal function appears to be prerenal and may be secondary to volume depletion Creatinine minimally improved. Will increase IV fluid-rate 250 mils per hour Check renal ultrasound Avoid nephrotoxic meds   Hypertensive urgency Secondary to medication noncompliance and a component of pain from her headache on IV hydralazine for systolic blood pressure greater than 130mmHg Continue amlodipine 10 mg daily  If BP still elevated will start p.o. hydralazine 25 3 times daily but this may be an issue for someone who is noncompliant    Diabetes mellitus with  complications of chronic kidney disease stage III Patient has a baseline serum creatinine of 1.9 but has been noncompliant with medication as well as her diet Carb controlled diet R ISS Check fingersticks   Morbid obesity (BMI 56) Complicates overall prognosis and care   Chest pain Most likely atypical and could be related to GERD Serial troponins were negative Try IV Protonix   HA-likely tension rather than migraine Neurologist input was appreciated Started on low-dose Decadron and 2 g of magnesium sulfate If symptoms worsen and does not improve will consider MRI of brain and MR venogram No neurologic deficits Under a lot of stress at home.   Vomiting- 2/2 severe HA v.s. hypertensive urgency v.s. combo of both Will obtain abd xr to make sure nothing GI related. Pain control for HA Control Bp  DVT prophylaxis:  Lovenox Code Status: Full Family Communication:  None at bedside  disposition Plan: Back to previous home environmentStatus is: Inpatient  Remains inpatient appropriate because:Ongoing active pain requiring inpatient pain management   Dispo: The patient is from: Home              Anticipated d/c is to: Home              Anticipated d/c date is: 1 day              Patient currently is not medically stable to d/c.  Needs pain management and IV steroids.  Has ongoing severe pain            LOS: 1 day   Time spent: 35 minutes with more than 50% on Grace, MD [facility] Pager [phone]  If 7PM-7AM, please contact night-coverage [email or web address] Password [facility] 12/10/2019, 2:29 PM

## 2019-12-10 NOTE — Progress Notes (Signed)
Patients BP 185/111 . Given PRN hydralazine , Morphine for headache and zofran for n/v . Notified Dr. Kurtis Bushman , awaiting response at this tiem

## 2019-12-10 NOTE — Progress Notes (Signed)
   12/10/19 1658  Vitals  Temp 98.6 F (37 C)  BP (!) 168/92  MAP (mmHg) 112  BP Location Left Arm  BP Method Automatic  Patient Position (if appropriate) Lying  Pulse Rate 101  Pulse Rate Source Monitor  Resp 18  MEWS COLOR  MEWS Score Color Green  Oxygen Therapy  SpO2 99 %  O2 Device Room Air  MEWS Score  MEWS Temp 0  MEWS Systolic 0  MEWS Pulse 1  MEWS RR 0  MEWS LOC 0  MEWS Score 1

## 2019-12-10 NOTE — Plan of Care (Signed)

## 2019-12-10 NOTE — Progress Notes (Signed)
   12/10/19 1621  Vitals  Temp 98.4 F (36.9 C)  BP (!) 185/111  MAP (mmHg) 132  BP Location Left Arm  BP Method Automatic  Patient Position (if appropriate) Lying  Pulse Rate 97  Pulse Rate Source Monitor  Resp 20  MEWS COLOR  MEWS Score Color Green  Oxygen Therapy  SpO2 100 %  MEWS Score  MEWS Temp 0  MEWS Systolic 0  MEWS Pulse 0  MEWS RR 0  MEWS LOC 0  MEWS Score 0
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
## 2019-12-10 NOTE — Progress Notes (Signed)
Ch visited with Pt and Pt's daughter Lenor Coffin in response to OR for prayer. When Ch asked if Pt was in pain, Julianna reported that there is more emotional pain than physical pain, because Pt's father passed away last week, and Pt was not able to attend funeral. Pt shared being upset about her dad not being taken care of by siblings, and he died of medical negligence. Ch prayed with them. Ch spoke at length with Julianna afterwards, when Pt was taken to get X-ray done. Ch provided comfort and support. Ch will follow-up with visit tomorrow.

## 2019-12-10 NOTE — Progress Notes (Signed)
Provider visits with patient. Encourages [place] to take patient's c/o pain more seriously. Provider communicates concerns of the potential for more serious pathologies as it relates to patient c/o headache pain and neuro eval. I communicated to provider my concerns of manipulative behavior by patient since being admitting to our unit, when patient expresses concerns of sadness and anxiety due to her father's recent passing. Discussed with provider the patient's continued c/o anxiety and persistence in requesting IV pain med either for sleep and/or anxiety and/or requesting strong iv pain med for moderate pain score of 5-6 out of 10 headache pain.   Orders placed for MRI and MR and pain medications added and modified and 1x dose of ativan ordered.

## 2019-12-10 NOTE — Consult Note (Signed)
Reason for Consult: headache Requesting Physician: Dr. Kurtis Bushman   CC: headache    HPI: [name] is an 44 y.o. female with medical history significant for diabetes mellitus with chronic kidney disease, morbid obesity,  hypertension and asthma who presents to the emergency room for evaluation of headache and vomiting which she has had according to her for months.  She describes the headache as an intense pain over the temporal and frontal part of her skull and radiating to the eyes which she rates a 6 x 10 in intensity at its worst. She denies having a history of migraine headaches and states that the pain has improved since she came into the emergency room. She also complains of burning sensation in her chest associated with nausea and multiple episodes of vomiting.  She denies having any abdominal pain or any changes in her bowel habits.  CT scan of the head done without contrast shows no acute findings   Past Medical History:  Diagnosis Date  . Asthma   . Diabetes ([place])   . Hypertension     Past Surgical History:  Procedure Laterality Date  . CESAREAN SECTION N/A   . CHOLECYSTECTOMY      Family History  Problem Relation Age of Onset  . Heart attack Father        died at ~77 years of age    Social History:  reports that she has never smoked. She has never used smokeless tobacco. She reports that she does not drink alcohol. No history on file for drug use.  Allergies  Allergen Reactions  . Cholesterol Rash    Cholesterol medicine    Medications:  Prior to Admission:  Medications Prior to Admission  Medication Sig Dispense Refill Last Dose  . amLODipine (NORVASC) 5 MG tablet Take 5 mg by mouth daily.     Marland Kitchen LEVEMIR FLEXTOUCH 100 UNIT/ML FlexPen Inject 7 Units into the skin at bedtime.   12/08/2019 at Unknown time  . benzonatate (TESSALON) 100 MG capsule Take 100 mg by mouth 3 (three) times daily.   prn at prn  . cetirizine (ZYRTEC) 10 MG tablet Take 10 mg by mouth daily.    prn at prn  . NOVOLIN R 100 UNIT/ML injection Inject 8 Units into the skin with breakfast, with lunch, and with evening meal.   12/07/2019 at 1800  . PROAIR HFA 108 (90 Base) MCG/ACT inhaler Inhale 1-2 puffs into the lungs every 6 (six) hours as needed for wheezing or shortness of breath.   prn at prn    ROS: History obtained from the patient  General ROS: negative for - chills, fatigue, fever, night sweats, weight gain or weight loss Psychological ROS: negative for - behavioral disorder, hallucinations, memory difficulties, mood swings or suicidal ideation Ophthalmic ROS: negative for - blurry vision, double vision, eye pain or loss of vision ENT ROS: negative for - epistaxis, nasal discharge, oral lesions, sore throat, tinnitus or vertigo Allergy and Immunology ROS: negative for - hives or itchy/watery eyes Hematological and Lymphatic ROS: negative for - bleeding problems, bruising or swollen lymph nodes Endocrine ROS: negative for - galactorrhea, hair pattern changes, polydipsia/polyuria or temperature intolerance Respiratory ROS: negative for - cough, hemoptysis, shortness of breath or wheezing Cardiovascular ROS: negative for - chest pain, dyspnea on exertion, edema or irregular heartbeat Gastrointestinal ROS: negative for - abdominal pain, diarrhea, hematemesis, nausea/vomiting or stool incontinence Genito-Urinary ROS: negative for - dysuria, hematuria, incontinence or urinary frequency/urgency Musculoskeletal ROS: negative for - joint swelling  or muscular weakness Neurological ROS: as noted in HPI Dermatological ROS: negative for rash and skin lesion changes  Physical Examination: Blood pressure (!) 137/73, pulse 98, temperature 98.5 F (36.9 C), temperature source Oral, resp. rate 18, height 5\' 1"  (1.549 m), weight (!) 140 kg, last menstrual period 12/08/2019, SpO2 100 %.   Neurological Examination   Mental Status: Alert, oriented, thought content appropriate.  Speech fluent  without evidence of aphasia.  Able to follow 3 step commands without difficulty. Cranial Nerves: II: Discs flat bilaterally; Visual fields grossly normal, pupils equal, round, reactive to light and accommodation III,IV, VI: ptosis not present, extra-ocular motions intact bilaterally V,VII: smile symmetric, facial light touch sensation normal bilaterally VIII: hearing normal bilaterally IX,X: gag reflex present XI: bilateral shoulder shrug XII: midline tongue extension Motor: Right : Upper extremity   5/5    Left:     Upper extremity   5/5  Lower extremity   5/5     Lower extremity   5/5 Tone and bulk:normal tone throughout; no atrophy noted Sensory: Pinprick and light touch intact throughout, bilaterally Deep Tendon Reflexes: 2+ and symmetric throughout Plantars: Right: downgoing   Left: downgoing Cerebellar: normal finger-to-nose Gait: not tested       Laboratory Studies:   Basic Metabolic Panel: Recent Labs  Lab 12/08/19 1905 12/10/19 0505  NA 134* 136  K 5.0 5.0  CL 111 113*  CO2 17* 16*  GLUCOSE 251* 172*  BUN 22* 17  CREATININE 3.26* 3.03*  CALCIUM 7.7* 7.9*    Liver Function Tests: Recent Labs  Lab 12/08/19 1905  AST 13*  ALT 10  ALKPHOS 102  BILITOT 0.7  PROT 5.5*  ALBUMIN 2.5*   No results for input(s): LIPASE, AMYLASE in the last 168 hours. No results for input(s): AMMONIA in the last 168 hours.  CBC: Recent Labs  Lab 12/08/19 1905 12/10/19 0505  WBC 10.2 9.1  HGB 11.5* 11.4*  HCT 35.1* 35.0*  MCV 86.2 87.3  PLT 320 362    Cardiac Enzymes: No results for input(s): CKTOTAL, CKMB, CKMBINDEX, TROPONINI in the last 168 hours.  BNP: Invalid input(s): POCBNP  CBG: Recent Labs  Lab 12/09/19 2012 12/09/19 2203 12/10/19 0816 12/10/19 1158  GLUCAP 181* 168* 166* 181*    Microbiology: Results for orders placed or performed during the hospital encounter of 12/09/19  SARS Coronavirus 2 by RT PCR (hospital order, performed in [facility]  hospital lab) Nasopharyngeal Nasopharyngeal Swab     Status: None   Collection Time: 12/09/19  7:54 AM   Specimen: Nasopharyngeal Swab  Result Value Ref Range Status   SARS Coronavirus 2 NEGATIVE NEGATIVE Final    Comment: (NOTE) SARS-CoV-2 target nucleic acids are NOT DETECTED.  The SARS-CoV-2 RNA is generally detectable in upper and lower respiratory specimens during the acute phase of infection. The lowest concentration of SARS-CoV-2 viral copies this assay can detect is 250 copies / mL. A negative result does not preclude SARS-CoV-2 infection and should not be used as the sole basis for treatment or other patient management decisions.  A negative result may occur with improper specimen collection / handling, submission of specimen other than nasopharyngeal swab, presence of viral mutation(s) within the areas targeted by this assay, and inadequate number of viral copies (<250 copies / mL). A negative result must be combined with clinical observations, patient history, and epidemiological information.  Fact Sheet for Patients:   StrictlyIdeas.no  Fact Sheet for Healthcare Providers: BankingDealers.co.za  This test is not  yet approved or  cleared by the [place] and has been authorized for detection and/or diagnosis of SARS-CoV-2 by FDA under an Emergency Use Authorization (EUA).  This EUA will remain in effect (meaning this test can be used) for the duration of the COVID-19 declaration under Section 564(b)(1) of the Act, 21 U.S.C. section 360bbb-3(b)(1), unless the authorization is terminated or revoked sooner.  Performed at [facility], [address] [place], [place] [address]     Coagulation Studies: No results for input(s): LABPROT, INR in the last 72 hours.  Urinalysis: No results for input(s): COLORURINE, LABSPEC, PHURINE, GLUCOSEU, HGBUR, BILIRUBINUR, KETONESUR, PROTEINUR, UROBILINOGEN, NITRITE,  LEUKOCYTESUR in the last 168 hours.  Invalid input(s): APPERANCEUR  Lipid Panel:  No results found for: CHOL, TRIG, HDL, CHOLHDL, VLDL, LDLCALC  HgbA1C:  Lab Results  Component Value Date   HGBA1C 10.1 (H) 12/09/2019    Urine Drug Screen:  No results found for: LABOPIA, COCAINSCRNUR, LABBENZ, AMPHETMU, THCU, LABBARB  Alcohol Level: No results for input(s): ETH in the last 168 hours.  Other results: EKG: normal EKG, normal sinus rhythm, unchanged from previous tracings.  Imaging: DG Chest 2 View  Result Date: 12/08/2019 CLINICAL DATA:  44 year old female with hypertension and headaches. EXAM: CHEST - 2 VIEW COMPARISON:  None. FINDINGS: No focal consolidation, pleural effusion, or pneumothorax. The cardiac silhouette is within limits. No acute osseous pathology. IMPRESSION: No active cardiopulmonary disease. Electronically Signed   By: Anner Crete M.D.   On: 12/08/2019 23:53   CT Head Wo Contrast  Result Date: 12/08/2019 CLINICAL DATA:  44 year old female with dizziness. EXAM: CT HEAD WITHOUT CONTRAST TECHNIQUE: Contiguous axial images were obtained from the base of the skull through the vertex without intravenous contrast. COMPARISON:  None. FINDINGS: Brain: The ventricles and sulci appropriate size for patient's age. The gray-white matter discrimination is preserved. There is no acute intracranial hemorrhage. No mass effect or midline shift. No extra-axial fluid collection. Vascular: No hyperdense vessel or unexpected calcification. Skull: Normal. Negative for fracture or focal lesion. Sinuses/Orbits: No acute finding. Other: None IMPRESSION: Unremarkable noncontrast CT of the brain. Electronically Signed   By: Anner Crete M.D.   On: 12/08/2019 23:38   US RENAL  Result Date: 12/10/2019 CLINICAL DATA:  Acute renal insufficiency. EXAM: RENAL / URINARY TRACT ULTRASOUND COMPLETE COMPARISON:  None. FINDINGS: Right Kidney: Renal measurements: 10.4 x 4.8 x 5.6 cm = volume: 145.9 mL .  Normal renal cortical thickness and echogenicity without focal lesions or hydronephrosis. Left Kidney: Renal measurements: 11.7 x 5.1 x 5.3 cm = volume: 166.5 mL. Normal renal cortical thickness and echogenicity without focal lesions or hydronephrosis. Bladder: Appears normal for degree of bladder distention. Other: The exam is somewhat limited by body habitus. IMPRESSION: 1. Unremarkable renal ultrasound examination. No renal lesions or hydronephrosis. 2. Poorly distended bladder but no abnormality. Electronically Signed   By: Marijo Sanes M.D.   On: 12/10/2019 12:24     Assessment/Plan:  44 y.o. female with medical history significant for diabetes mellitus with chronic kidney disease, morbid obesity,  hypertension and asthma who presents to the emergency room for evaluation of headache and vomiting which she has had according to her for months.  She describes the headache as an intense pain over the temporal and frontal part of her skull and radiating to the eyes which she rates a 6 x 10 in intensity at its worst. She denies having a history of migraine headaches and states that the pain has improved  since she came into the emergency room. She also complains of burning sensation in her chest associated with nausea and multiple episodes of vomiting.  She denies having any abdominal pain or any changes in her   - Current headache is  diffused pressure like bilateral temporal lobe - Headache is more so tension type of headache rather then migraine - glucose is 172.  Has chronic DM.   - Will give smaller dose of decadron then I usually do due to DM - decadron 6mg  IV now and 2 g Mg sulfate - If worsened and doesn't improve will consider MRI brain and MR venogram - no clear focality on examination  - pt does state increased stress  12/10/2019, 1:22 PM

## 2019-12-10 NOTE — Progress Notes (Signed)
Inpatient Diabetes Program Recommendations  AACE/ADA: New Consensus Statement on Inpatient Glycemic Control (2015)  Target Ranges:  Prepandial:   less than 140 mg/dL      Peak postprandial:   less than 180 mg/dL (1-2 hours)      Critically ill patients:  140 - 180 mg/dL   Lab Results  Component Value Date   GLUCAP 181 (H) 12/10/2019   HGBA1C 10.1 (H) 12/09/2019    Review of Glycemic Control Results for [name], [name] (MRN [id number]) as of 12/10/2019 12:48  Ref. Range 12/10/2019 11:58  Glucose-Capillary Latest Ref Range: 70 - 99 mg/dL 181 (H)   Diabetes history: DM2 Outpatient Diabetes medications: Levemir 7 units qd + Novoling R 8 units tid meal coverage Current orders for Inpatient glycemic control: Novolog resistant correction tid   Inpatient Diabetes Program Recommendations:   Attempted to speak with patient but limited due to just received Morphine for discomfort. Patient states briefly that she has insulin but patient unable to answer how often she is taking as prescribed. A1c 10.1 (average blood glucose 243 over the past 2-3 months)  Patient requests to followup tomorrow.Ordered Living Well With Diabetes book.  Thank you, Nani Gasser. Nayellie Sanseverino, RN, MSN, CDE  Diabetes Coordinator Inpatient Glycemic Control Team Team Pager [phone] (8am-5pm) 12/10/2019 1:00 PM

## 2019-12-10 NOTE — Progress Notes (Signed)
Patient appears to be exhibiting drug-seeking behavior. Patient walks to the door of her room and points to her head. Patient reports c/o of 5-6 h/a pain, but appears to be avoiding direct eye-contact.  Patient asks when can she get something for pain, I communicated to patient that I would bring something back as soon as II can. While administering tylenol, patient asks "when can I get my morphine." I communicated to the patient that it depends on how much the tylenol will help her pain. Then proceeded to explain to patient the potential causes of headache and her tx plan.  Patient said thank you for the explanation.  Patient then proceeds to request something for sleep and communicates that this as why she wants the morphine because it will help her sleep. I believe that a psychiatric consult is order for this patient. Will request sleep aid and something else for pain, besides morphine for patient.

## 2019-12-11 ENCOUNTER — Inpatient Hospital Stay: Payer: Self-pay

## 2019-12-11 DIAGNOSIS — E875 Hyperkalemia: Secondary | ICD-10-CM

## 2019-12-11 LAB — BASIC METABOLIC PANEL
Anion gap: 7 (ref 5–15)
BUN: 19 mg/dL (ref 6–20)
CO2: 16 mmol/L — ABNORMAL LOW (ref 22–32)
Calcium: 8.2 mg/dL — ABNORMAL LOW (ref 8.9–10.3)
Chloride: 109 mmol/L (ref 98–111)
Creatinine, Ser: 3.1 mg/dL — ABNORMAL HIGH (ref 0.44–1.00)
GFR calc Af Amer: 20 mL/min — ABNORMAL LOW (ref >60)
GFR calc non Af Amer: 17 mL/min — ABNORMAL LOW (ref >60)
Glucose, Bld: 277 mg/dL — ABNORMAL HIGH (ref 70–99)
Potassium: 5.5 mmol/L — ABNORMAL HIGH (ref 3.5–5.1)
Sodium: 132 mmol/L — ABNORMAL LOW (ref 135–145)

## 2019-12-11 LAB — GLUCOSE, CAPILLARY
Glucose-Capillary: 229 mg/dL — ABNORMAL HIGH (ref 70–99)
Glucose-Capillary: 260 mg/dL — ABNORMAL HIGH (ref 70–99)
Glucose-Capillary: 220 mg/dL — ABNORMAL HIGH (ref 70–99)
Glucose-Capillary: 248 mg/dL — ABNORMAL HIGH (ref 70–99)

## 2019-12-11 LAB — SEDIMENTATION RATE: Sed Rate: 63 mm/hr — ABNORMAL HIGH (ref 0–20)

## 2019-12-11 LAB — POTASSIUM: Potassium: 5 mmol/L (ref 3.5–5.1)

## 2019-12-11 MED ORDER — SODIUM BICARBONATE 650 MG PO TABS
650.0000 mg | ORAL_TABLET | Freq: Two times a day (BID) | ORAL | Status: DC
  Administered 2019-12-11 – 2019-12-13 (×4): 650 mg via ORAL
  Filled 2019-12-11 (×6): qty 1

## 2019-12-11 MED ORDER — GADOBUTROL 1 MMOL/ML IV SOLN
10.0000 mL | Freq: Once | INTRAVENOUS | Status: AC | PRN
  Administered 2019-12-11: 10 mL via INTRAVENOUS

## 2019-12-11 NOTE — Progress Notes (Signed)
Subjective: Headache improved. S/p MRI and MRV   Past Medical History:  Diagnosis Date  . Asthma   . Diabetes ([place])   . Hypertension     Past Surgical History:  Procedure Laterality Date  . CESAREAN SECTION N/A   . CHOLECYSTECTOMY      Family History  Problem Relation Age of Onset  . Heart attack Father        died at ~45 years of age    Social History:  reports that she has never smoked. She has never used smokeless tobacco. She reports that she does not drink alcohol. No history on file for drug use.  Allergies  Allergen Reactions  . Cholesterol Rash    Cholesterol medicine    Medications:  Prior to Admission:  Medications Prior to Admission  Medication Sig Dispense Refill Last Dose  . amLODipine (NORVASC) 5 MG tablet Take 5 mg by mouth daily.     Marland Kitchen LEVEMIR FLEXTOUCH 100 UNIT/ML FlexPen Inject 7 Units into the skin at bedtime.   12/08/2019 at Unknown time  . benzonatate (TESSALON) 100 MG capsule Take 100 mg by mouth 3 (three) times daily.   prn at prn  . cetirizine (ZYRTEC) 10 MG tablet Take 10 mg by mouth daily.   prn at prn  . NOVOLIN R 100 UNIT/ML injection Inject 8 Units into the skin with breakfast, with lunch, and with evening meal.   12/07/2019 at 1800  . PROAIR HFA 108 (90 Base) MCG/ACT inhaler Inhale 1-2 puffs into the lungs every 6 (six) hours as needed for wheezing or shortness of breath.   prn at prn     Physical Examination: Blood pressure (!) 177/105, pulse 95, temperature 98 F (36.7 C), temperature source Oral, resp. rate 18, height 5\' 1"  (1.549 m), weight (!) 140 kg, last menstrual period 12/08/2019, SpO2 99 %.   Neurological Examination   Mental Status: Alert, oriented, thought content appropriate.  Speech fluent without evidence of aphasia.  Able to follow 3 step commands without difficulty. Cranial Nerves: II: Discs flat bilaterally; Visual fields grossly normal, pupils equal, round, reactive to light and accommodation III,IV, VI: ptosis not  present, extra-ocular motions intact bilaterally V,VII: smile symmetric, facial light touch sensation normal bilaterally VIII: hearing normal bilaterally IX,X: gag reflex present XI: bilateral shoulder shrug XII: midline tongue extension Motor: Right : Upper extremity   5/5    Left:     Upper extremity   5/5  Lower extremity   5/5     Lower extremity   5/5 Tone and bulk:normal tone throughout; no atrophy noted Sensory: Pinprick and light touch intact throughout, bilaterally Deep Tendon Reflexes: 1+ and symmetric throughout Plantars: Right: downgoing   Left: downgoing Cerebellar: normal finger-to-nose Gait: not tested       Laboratory Studies:   Basic Metabolic Panel: Recent Labs  Lab 12/08/19 1905 12/10/19 0505 12/11/19 0513  NA 134* 136 132*  K 5.0 5.0 5.5*  CL 111 113* 109  CO2 17* 16* 16*  GLUCOSE 251* 172* 277*  BUN 22* 17 19  CREATININE 3.26* 3.03* 3.10*  CALCIUM 7.7* 7.9* 8.2*    Liver Function Tests: Recent Labs  Lab 12/08/19 1905  AST 13*  ALT 10  ALKPHOS 102  BILITOT 0.7  PROT 5.5*  ALBUMIN 2.5*   No results for input(s): LIPASE, AMYLASE in the last 168 hours. No results for input(s): AMMONIA in the last 168 hours.  CBC: Recent Labs  Lab 12/08/19 1905 12/10/19 0505  WBC  10.2 9.1  HGB 11.5* 11.4*  HCT 35.1* 35.0*  MCV 86.2 87.3  PLT 320 362    Cardiac Enzymes: No results for input(s): CKTOTAL, CKMB, CKMBINDEX, TROPONINI in the last 168 hours.  BNP: Invalid input(s): POCBNP  CBG: Recent Labs  Lab 12/10/19 0816 12/10/19 1158 12/10/19 1632 12/10/19 2124 12/11/19 0734  GLUCAP 166* 181* 190* 285* 229*    Microbiology: Results for orders placed or performed during the hospital encounter of 12/09/19  SARS Coronavirus 2 by RT PCR (hospital order, performed in [facility] hospital lab) Nasopharyngeal Nasopharyngeal Swab     Status: None   Collection Time: 12/09/19  7:54 AM   Specimen: Nasopharyngeal Swab  Result Value Ref Range  Status   SARS Coronavirus 2 NEGATIVE NEGATIVE Final    Comment: (NOTE) SARS-CoV-2 target nucleic acids are NOT DETECTED.  The SARS-CoV-2 RNA is generally detectable in upper and lower respiratory specimens during the acute phase of infection. The lowest concentration of SARS-CoV-2 viral copies this assay can detect is 250 copies / mL. A negative result does not preclude SARS-CoV-2 infection and should not be used as the sole basis for treatment or other patient management decisions.  A negative result may occur with improper specimen collection / handling, submission of specimen other than nasopharyngeal swab, presence of viral mutation(s) within the areas targeted by this assay, and inadequate number of viral copies (<250 copies / mL). A negative result must be combined with clinical observations, patient history, and epidemiological information.  Fact Sheet for Patients:   StrictlyIdeas.no  Fact Sheet for Healthcare Providers: BankingDealers.co.za  This test is not yet approved or  cleared by the [place] FDA and has been authorized for detection and/or diagnosis of SARS-CoV-2 by FDA under an Emergency Use Authorization (EUA).  This EUA will remain in effect (meaning this test can be used) for the duration of the COVID-19 declaration under Section 564(b)(1) of the Act, 21 U.S.C. section 360bbb-3(b)(1), unless the authorization is terminated or revoked sooner.  Performed at [facility], [address] [place], [place] [address]     Coagulation Studies: No results for input(s): LABPROT, INR in the last 72 hours.  Urinalysis: No results for input(s): COLORURINE, LABSPEC, PHURINE, GLUCOSEU, HGBUR, BILIRUBINUR, KETONESUR, PROTEINUR, UROBILINOGEN, NITRITE, LEUKOCYTESUR in the last 168 hours.  Invalid input(s): APPERANCEUR  Lipid Panel:  No results found for: CHOL, TRIG, HDL, CHOLHDL, VLDL, LDLCALC  HgbA1C:  Lab  Results  Component Value Date   HGBA1C 10.1 (H) 12/09/2019    Urine Drug Screen:  No results found for: LABOPIA, COCAINSCRNUR, LABBENZ, AMPHETMU, THCU, LABBARB  Alcohol Level: No results for input(s): ETH in the last 168 hours.  Other results: EKG: normal EKG, normal sinus rhythm, unchanged from previous tracings.  Imaging: DG Abd 1 View  Result Date: 12/10/2019 CLINICAL DATA:  Nausea and vomiting EXAM: ABDOMEN - 1 VIEW COMPARISON:  None. FINDINGS: Bowel gas pattern is unremarkable. No significant stool burden. No acute osseous abnormality. Cholecystectomy clips. IMPRESSION: Negative. Electronically Signed   By: Macy Mis M.D.   On: 12/10/2019 17:11   MR BRAIN WO CONTRAST  Result Date: 12/11/2019 CLINICAL DATA:  Initial evaluation for acute headache and vomiting. EXAM: MRI HEAD WITHOUT CONTRAST MRV HEAD WITHOUT AND WITH CONTRAST TECHNIQUE: Multiplanar, multiecho pulse sequences of the brain and surrounding structures were obtained without and with intravenous contrast. Angiographic images of the intracranial venous structures were obtained using MRV technique without intravenous contrast. CONTRAST:  19mL GADAVIST GADOBUTROL 1 MMOL/ML IV  SOLN COMPARISON:  Prior head CT from 12/08/2019. FINDINGS: MRI HEAD FINDINGS: Brain: Cerebral volume within normal limits for patient age. Mild scattered T2/FLAIR hyperintensity noted within the periventricular white matter, nonspecific, but felt to be within normal limits for age. No abnormal foci of restricted diffusion to suggest acute or subacute ischemia. Gray-white matter differentiation well maintained. No encephalomalacia to suggest chronic infarction. No foci of susceptibility artifact to suggest acute or chronic intracranial hemorrhage. No mass lesion, midline shift or mass effect. No hydrocephalus. No extra-axial fluid collection. Major dural sinuses are grossly patent. Pituitary gland and suprasellar region are normal. Midline structures intact and  normal. Vascular: Major intracranial vascular flow voids well maintained and normal in appearance. Skull and upper cervical spine: Craniocervical junction normal. Visualized upper cervical spine within normal limits. Bone marrow signal intensity normal. No scalp soft tissue abnormality. Sinuses/Orbits: Globes and orbital soft tissues within normal limits. Mild scattered mucosal thickening noted within the ethmoidal air cells and maxillary sinuses. Paranasal sinuses are otherwise clear. Trace bilateral mastoid effusions noted, of doubtful significance. Inner ear structures grossly normal. Other: None. MRV HEAD FINDINGS: Normal flow related signal and enhancement seen throughout the superior sagittal sinus to the level of the torcula. Torcula itself is patent. Right transverse and sigmoid sinuses are patent as is the visualized proximal right internal jugular vein. Left transverse sinus diffusely hypoplastic but grossly patent as well. Left sigmoid sinus patent as is the visualized proximal left internal jugular vein. Straight sinus and vein of Galen are patent. Internal cerebral veins grossly patent as well. No findings to suggest dural sinus thrombosis. IMPRESSION: 1. Negative brain MRI. No acute intracranial abnormality identified. 2. Negative intracranial MRV. No evidence for dural sinus thrombosis. Electronically Signed   By: Jeannine Boga M.D.   On: 12/11/2019 02:25   US RENAL  Result Date: 12/10/2019 CLINICAL DATA:  Acute renal insufficiency. EXAM: RENAL / URINARY TRACT ULTRASOUND COMPLETE COMPARISON:  None. FINDINGS: Right Kidney: Renal measurements: 10.4 x 4.8 x 5.6 cm = volume: 145.9 mL . Normal renal cortical thickness and echogenicity without focal lesions or hydronephrosis. Left Kidney: Renal measurements: 11.7 x 5.1 x 5.3 cm = volume: 166.5 mL. Normal renal cortical thickness and echogenicity without focal lesions or hydronephrosis. Bladder: Appears normal for degree of bladder distention.  Other: The exam is somewhat limited by body habitus. IMPRESSION: 1. Unremarkable renal ultrasound examination. No renal lesions or hydronephrosis. 2. Poorly distended bladder but no abnormality. Electronically Signed   By: Marijo Sanes M.D.   On: 12/10/2019 12:24   MR MRV HEAD W WO CONTRAST  Result Date: 12/11/2019 CLINICAL DATA:  Initial evaluation for acute headache and vomiting. EXAM: MRI HEAD WITHOUT CONTRAST MRV HEAD WITHOUT AND WITH CONTRAST TECHNIQUE: Multiplanar, multiecho pulse sequences of the brain and surrounding structures were obtained without and with intravenous contrast. Angiographic images of the intracranial venous structures were obtained using MRV technique without intravenous contrast. CONTRAST:  91mL GADAVIST GADOBUTROL 1 MMOL/ML IV SOLN COMPARISON:  Prior head CT from 12/08/2019. FINDINGS: MRI HEAD FINDINGS: Brain: Cerebral volume within normal limits for patient age. Mild scattered T2/FLAIR hyperintensity noted within the periventricular white matter, nonspecific, but felt to be within normal limits for age. No abnormal foci of restricted diffusion to suggest acute or subacute ischemia. Gray-white matter differentiation well maintained. No encephalomalacia to suggest chronic infarction. No foci of susceptibility artifact to suggest acute or chronic intracranial hemorrhage. No mass lesion, midline shift or mass effect. No hydrocephalus. No extra-axial fluid collection. Major dural  sinuses are grossly patent. Pituitary gland and suprasellar region are normal. Midline structures intact and normal. Vascular: Major intracranial vascular flow voids well maintained and normal in appearance. Skull and upper cervical spine: Craniocervical junction normal. Visualized upper cervical spine within normal limits. Bone marrow signal intensity normal. No scalp soft tissue abnormality. Sinuses/Orbits: Globes and orbital soft tissues within normal limits. Mild scattered mucosal thickening noted within  the ethmoidal air cells and maxillary sinuses. Paranasal sinuses are otherwise clear. Trace bilateral mastoid effusions noted, of doubtful significance. Inner ear structures grossly normal. Other: None. MRV HEAD FINDINGS: Normal flow related signal and enhancement seen throughout the superior sagittal sinus to the level of the torcula. Torcula itself is patent. Right transverse and sigmoid sinuses are patent as is the visualized proximal right internal jugular vein. Left transverse sinus diffusely hypoplastic but grossly patent as well. Left sigmoid sinus patent as is the visualized proximal left internal jugular vein. Straight sinus and vein of Galen are patent. Internal cerebral veins grossly patent as well. No findings to suggest dural sinus thrombosis. IMPRESSION: 1. Negative brain MRI. No acute intracranial abnormality identified. 2. Negative intracranial MRV. No evidence for dural sinus thrombosis. Electronically Signed   By: Jeannine Boga M.D.   On: 12/11/2019 02:25     Assessment/Plan:  44 y.o. female with medical history significant for diabetes mellitus with chronic kidney disease, morbid obesity,  hypertension and asthma who presents to the emergency room for evaluation of headache and vomiting which she has had according to her for months.  She describes the headache as an intense pain over the temporal and frontal part of her skull and radiating to the eyes which she rates a 6 x 10 in intensity at its worst. She denies having a history of migraine headaches and states that the pain has improved since she came into the emergency room. She also complains of burning sensation in her chest associated with nausea and multiple episodes of vomiting.  She denies having any abdominal pain or any changes in her   - Current headache has improved.  - s/p MRI and MRV without any acute abnormalities.  - This is likely tension type of headache - likely d/c planning from Neurological stand point.      12/11/2019, 10:56 AM

## 2019-12-11 NOTE — Progress Notes (Addendum)
    BRIEF OVERNIGHT PROGRESS REPORT   SUBJECTIVE: Patient c/o headache rating 8/10 that is progressively increased in frequency, intensity and severity. The headache is intermittent, moderate to severe in intensity, located predominantly in her bitemporal region and bilateral supraorbital. She describes pain as throbbing, pressure and dull aching. There is associated vision disturbance which she describes as blurry vision. Denies associated altered sensorium, speech abnormality,focal motor or sensory deficits, nausea or vomiting. She verbalized concerns that staff is not taking her headache seriously and under treating her headache.  OBJECTIVE: On bedside assessment, she was afebrile with blood pressure 184/114 mm Hg and pulse rate 96 beats/min. There were no focal neurological deficits; she was alert and oriented x4 but c/o worsening headache.   ASSESSMENT: 44 y.o female with PMH of DM, HTN, CKD.Obesit and Asthma admitted with  AKI, Hypertensive urgency and chest pain now with acute onset bitemporal headaches associated with blurry vision. Concerns for pseudotumor cerebri given vision involvement, temporal arteritis and migraine or hypertensive headaches in the setting of persistent elevated BP.  PLAN: -CT head negative for acute intracranial abnormality -Obtain MRI/MRV Brain for further evaluation will give Lorazepam for claustrophobia -Check ESR  -PRN IV and po pain management -Blood pressure control -Neurology following     Rufina Falco, BSN, MSN, DNP, CCRN,FNP-C  [facility] Nurse Practitioner  [facility]

## 2019-12-11 NOTE — Progress Notes (Signed)
Pt BP 192/104, RN rechecked BP. BP 174/105. PRN Hydralazine given and scheduled amlodipine. BP will be rechecked. Pt requesting regular diet tray, states she is tired of all liquids. Pt also states she hasn't had N/V in 2 days, but pt chart says otherwise. RN notified Dr. Jimmye Norman about BP and diet request.

## 2019-12-11 NOTE — Plan of Care (Signed)

## 2019-12-11 NOTE — Progress Notes (Addendum)
PROGRESS NOTE    [name]  TFT:[id number] DOB: 01-21-1976 DOA: 12/09/2019 PCP: Patient, No Pcp Per   Assessment & Plan:   Principal Problem:   AKI (acute kidney injury) ([place]) Active Problems:   Obesity, Class III, BMI 40-49.9 (morbid obesity) ([place])   Diabetes ([place])   Hypertensive urgency   Chest pain   AKI on CKDIIIb: baseline Cr 1.9. Cr is trending up today and if continues to trend will consult nephro. Avoid nephrotoxic meds. Unremarkable renal US. Possibly secondary to home NSAIDs use.   Hyperkalemia: repeat K WNL. No intervention. Likely secondary to AKI on CKD. Will continue to monitor   Metabolic acidosis: will start bicarb   Hypertensive urgency: secondary to medication noncompliance. Continue on amlodipine. IV hydralazine prn   DM2:  poorly controlled. Continue on SSI w/ accuchecks. Carb modified diet  Morbid obesity: BMI 56.3. Would benefit from significant weight loss.  Complicates overall prognosis and care  Chest pain: most likely atypical and could be related to GERD. Serial troponins were negative. Continue on PPI   Headache: likely tension. S/p low-dose decadron and 2 g of magnesium sulfate. Previously took NSAIDs at home for headaches. If symptoms worsen and does not improve will consider MRI of brain and MR venogram. No neurologic deficits. Significant stress at home    Vomiting: secondary severe HA v.s. hypertensive urgency. XR abd shows no acute findings. Zofran prn    DVT prophylaxis: lovenox Code Status: full  Family Communication:  Disposition Plan: likely d/c back home   Status is: Inpatient  Remains inpatient appropriate because:Ongoing diagnostic testing needed not appropriate for outpatient work up   Dispo: The patient is from: Home              Anticipated d/c is to: Home              Anticipated d/c date is: 2 days              Patient currently is not medically stable to d/c.      Consultants:   neuro   Procedures:      Antimicrobials:   Subjective: Pt c/o headache  Objective: Vitals:   12/11/19 0403 12/11/19 0756 12/11/19 0836 12/11/19 1100  BP: (!) 159/89 (!) 192/104 (!) 177/105 (!) 158/89  Pulse: 100 98 95   Resp:  18    Temp:  98 F (36.7 C)    TempSrc:  Oral    SpO2:  97% 99%   Weight:      Height:        Intake/Output Summary (Last 24 hours) at 12/11/2019 1550 Last data filed at 12/11/2019 1358 Gross per 24 hour  Intake 480 ml  Output --  Net 480 ml   Filed Weights   12/08/19 1900 12/09/19 2141  Weight: (!) 135.2 kg (!) 140 kg    Examination:  General exam: Appears calm but uncomfortable. Morbidly obese  Respiratory system: diminished breath sounds b/l otherwise cleawr. No rales, rhonchi  Cardiovascular system: S1 & S2 +. No  murmurs, rubs, gallops or clicks Gastrointestinal system: Abdomen is obese, soft and nontender.Normal bowel sounds heard. Central nervous system: Alert and oriented. Moves all 4 extremities  Psychiatry: Judgement and insight appear normal. Flat mood and affect.     Data Reviewed: I have personally reviewed following labs and imaging studies  CBC: Recent Labs  Lab 12/08/19 1905 12/10/19 0505  WBC 10.2 9.1  HGB 11.5* 11.4*  HCT 35.1* 35.0*  MCV 86.2 87.3  PLT 320 481   Basic Metabolic Panel: Recent Labs  Lab 12/08/19 1905 12/10/19 0505 12/11/19 0513 12/11/19 1127  NA 134* 136 132*  --   K 5.0 5.0 5.5* 5.0  CL 111 113* 109  --   CO2 17* 16* 16*  --   GLUCOSE 251* 172* 277*  --   BUN 22* 17 19  --   CREATININE 3.26* 3.03* 3.10*  --   CALCIUM 7.7* 7.9* 8.2*  --    GFR: Estimated Creatinine Clearance: 31 mL/min (A) (by C-G formula based on SCr of 3.1 mg/dL (H)). Liver Function Tests: Recent Labs  Lab 12/08/19 1905  AST 13*  ALT 10  ALKPHOS 102  BILITOT 0.7  PROT 5.5*  ALBUMIN 2.5*   No results for input(s): LIPASE, AMYLASE in the last 168 hours. No results for input(s): AMMONIA in the last 168 hours. Coagulation  Profile: No results for input(s): INR, PROTIME in the last 168 hours. Cardiac Enzymes: No results for input(s): CKTOTAL, CKMB, CKMBINDEX, TROPONINI in the last 168 hours. BNP (last 3 results) No results for input(s): PROBNP in the last 8760 hours. HbA1C: Recent Labs    12/09/19 1153  HGBA1C 10.1*   CBG: Recent Labs  Lab 12/10/19 1158 12/10/19 1632 12/10/19 2124 12/11/19 0734 12/11/19 1141  GLUCAP 181* 190* 285* 229* 260*   Lipid Profile: No results for input(s): CHOL, HDL, LDLCALC, TRIG, CHOLHDL, LDLDIRECT in the last 72 hours. Thyroid Function Tests: No results for input(s): TSH, T4TOTAL, FREET4, T3FREE, THYROIDAB in the last 72 hours. Anemia Panel: No results for input(s): VITAMINB12, FOLATE, FERRITIN, TIBC, IRON, RETICCTPCT in the last 72 hours. Sepsis Labs: No results for input(s): PROCALCITON, LATICACIDVEN in the last 168 hours.  Recent Results (from the past 240 hour(s))  SARS Coronavirus 2 by RT PCR (hospital order, performed in [facility] hospital lab) Nasopharyngeal Nasopharyngeal Swab     Status: None   Collection Time: 12/09/19  7:54 AM   Specimen: Nasopharyngeal Swab  Result Value Ref Range Status   SARS Coronavirus 2 NEGATIVE NEGATIVE Final    Comment: (NOTE) SARS-CoV-2 target nucleic acids are NOT DETECTED.  The SARS-CoV-2 RNA is generally detectable in upper and lower respiratory specimens during the acute phase of infection. The lowest concentration of SARS-CoV-2 viral copies this assay can detect is 250 copies / mL. A negative result does not preclude SARS-CoV-2 infection and should not be used as the sole basis for treatment or other patient management decisions.  A negative result may occur with improper specimen collection / handling, submission of specimen other than nasopharyngeal swab, presence of viral mutation(s) within the areas targeted by this assay, and inadequate number of viral copies (<250 copies / mL). A negative result must be  combined with clinical observations, patient history, and epidemiological information.  Fact Sheet for Patients:   StrictlyIdeas.no  Fact Sheet for Healthcare Providers: BankingDealers.co.za  This test is not yet approved or  cleared by the [place] FDA and has been authorized for detection and/or diagnosis of SARS-CoV-2 by FDA under an Emergency Use Authorization (EUA).  This EUA will remain in effect (meaning this test can be used) for the duration of the COVID-19 declaration under Section 564(b)(1) of the Act, 21 U.S.C. section 360bbb-3(b)(1), unless the authorization is terminated or revoked sooner.  Performed at [facility], [address] [place], [place] [address]          Radiology Studies: DG Abd 1 View  Result Date: 12/10/2019 CLINICAL DATA:  Nausea and vomiting EXAM: ABDOMEN - 1 VIEW COMPARISON:  None. FINDINGS: Bowel gas pattern is unremarkable. No significant stool burden. No acute osseous abnormality. Cholecystectomy clips. IMPRESSION: Negative. Electronically Signed   By: Macy Mis M.D.   On: 12/10/2019 17:11   MR BRAIN WO CONTRAST  Result Date: 12/11/2019 CLINICAL DATA:  Initial evaluation for acute headache and vomiting. EXAM: MRI HEAD WITHOUT CONTRAST MRV HEAD WITHOUT AND WITH CONTRAST TECHNIQUE: Multiplanar, multiecho pulse sequences of the brain and surrounding structures were obtained without and with intravenous contrast. Angiographic images of the intracranial venous structures were obtained using MRV technique without intravenous contrast. CONTRAST:  45mL GADAVIST GADOBUTROL 1 MMOL/ML IV SOLN COMPARISON:  Prior head CT from 12/08/2019. FINDINGS: MRI HEAD FINDINGS: Brain: Cerebral volume within normal limits for patient age. Mild scattered T2/FLAIR hyperintensity noted within the periventricular white matter, nonspecific, but felt to be within normal limits for age. No abnormal foci of  restricted diffusion to suggest acute or subacute ischemia. Gray-white matter differentiation well maintained. No encephalomalacia to suggest chronic infarction. No foci of susceptibility artifact to suggest acute or chronic intracranial hemorrhage. No mass lesion, midline shift or mass effect. No hydrocephalus. No extra-axial fluid collection. Major dural sinuses are grossly patent. Pituitary gland and suprasellar region are normal. Midline structures intact and normal. Vascular: Major intracranial vascular flow voids well maintained and normal in appearance. Skull and upper cervical spine: Craniocervical junction normal. Visualized upper cervical spine within normal limits. Bone marrow signal intensity normal. No scalp soft tissue abnormality. Sinuses/Orbits: Globes and orbital soft tissues within normal limits. Mild scattered mucosal thickening noted within the ethmoidal air cells and maxillary sinuses. Paranasal sinuses are otherwise clear. Trace bilateral mastoid effusions noted, of doubtful significance. Inner ear structures grossly normal. Other: None. MRV HEAD FINDINGS: Normal flow related signal and enhancement seen throughout the superior sagittal sinus to the level of the torcula. Torcula itself is patent. Right transverse and sigmoid sinuses are patent as is the visualized proximal right internal jugular vein. Left transverse sinus diffusely hypoplastic but grossly patent as well. Left sigmoid sinus patent as is the visualized proximal left internal jugular vein. Straight sinus and vein of Galen are patent. Internal cerebral veins grossly patent as well. No findings to suggest dural sinus thrombosis. IMPRESSION: 1. Negative brain MRI. No acute intracranial abnormality identified. 2. Negative intracranial MRV. No evidence for dural sinus thrombosis. Electronically Signed   By: Jeannine Boga M.D.   On: 12/11/2019 02:25   US RENAL  Result Date: 12/10/2019 CLINICAL DATA:  Acute renal  insufficiency. EXAM: RENAL / URINARY TRACT ULTRASOUND COMPLETE COMPARISON:  None. FINDINGS: Right Kidney: Renal measurements: 10.4 x 4.8 x 5.6 cm = volume: 145.9 mL . Normal renal cortical thickness and echogenicity without focal lesions or hydronephrosis. Left Kidney: Renal measurements: 11.7 x 5.1 x 5.3 cm = volume: 166.5 mL. Normal renal cortical thickness and echogenicity without focal lesions or hydronephrosis. Bladder: Appears normal for degree of bladder distention. Other: The exam is somewhat limited by body habitus. IMPRESSION: 1. Unremarkable renal ultrasound examination. No renal lesions or hydronephrosis. 2. Poorly distended bladder but no abnormality. Electronically Signed   By: Marijo Sanes M.D.   On: 12/10/2019 12:24   MR MRV HEAD W WO CONTRAST  Result Date: 12/11/2019 CLINICAL DATA:  Initial evaluation for acute headache and vomiting. EXAM: MRI HEAD WITHOUT CONTRAST MRV HEAD WITHOUT AND WITH CONTRAST TECHNIQUE: Multiplanar, multiecho pulse sequences of the brain and surrounding structures were obtained without and with intravenous contrast.  Angiographic images of the intracranial venous structures were obtained using MRV technique without intravenous contrast. CONTRAST:  34mL GADAVIST GADOBUTROL 1 MMOL/ML IV SOLN COMPARISON:  Prior head CT from 12/08/2019. FINDINGS: MRI HEAD FINDINGS: Brain: Cerebral volume within normal limits for patient age. Mild scattered T2/FLAIR hyperintensity noted within the periventricular white matter, nonspecific, but felt to be within normal limits for age. No abnormal foci of restricted diffusion to suggest acute or subacute ischemia. Gray-white matter differentiation well maintained. No encephalomalacia to suggest chronic infarction. No foci of susceptibility artifact to suggest acute or chronic intracranial hemorrhage. No mass lesion, midline shift or mass effect. No hydrocephalus. No extra-axial fluid collection. Major dural sinuses are grossly patent. Pituitary  gland and suprasellar region are normal. Midline structures intact and normal. Vascular: Major intracranial vascular flow voids well maintained and normal in appearance. Skull and upper cervical spine: Craniocervical junction normal. Visualized upper cervical spine within normal limits. Bone marrow signal intensity normal. No scalp soft tissue abnormality. Sinuses/Orbits: Globes and orbital soft tissues within normal limits. Mild scattered mucosal thickening noted within the ethmoidal air cells and maxillary sinuses. Paranasal sinuses are otherwise clear. Trace bilateral mastoid effusions noted, of doubtful significance. Inner ear structures grossly normal. Other: None. MRV HEAD FINDINGS: Normal flow related signal and enhancement seen throughout the superior sagittal sinus to the level of the torcula. Torcula itself is patent. Right transverse and sigmoid sinuses are patent as is the visualized proximal right internal jugular vein. Left transverse sinus diffusely hypoplastic but grossly patent as well. Left sigmoid sinus patent as is the visualized proximal left internal jugular vein. Straight sinus and vein of Galen are patent. Internal cerebral veins grossly patent as well. No findings to suggest dural sinus thrombosis. IMPRESSION: 1. Negative brain MRI. No acute intracranial abnormality identified. 2. Negative intracranial MRV. No evidence for dural sinus thrombosis. Electronically Signed   By: Jeannine Boga M.D.   On: 12/11/2019 02:25        Scheduled Meds:  amLODipine  10 mg Oral Daily   enoxaparin (LOVENOX) injection  40 mg Subcutaneous Q12H   hydrALAZINE  25 mg Oral Q8H   insulin aspart  0-20 Units Subcutaneous TID WC   pantoprazole (PROTONIX) IV  40 mg Intravenous Q24H   sodium chloride flush  3 mL Intravenous Q12H   Continuous Infusions:  sodium chloride 150 mL/hr at 12/11/19 0532   sodium chloride       LOS: 2 days    Time spent: 33 mins     Wyvonnia Dusky,  MD [facility] Pager [phone]  If 7PM-7AM, please contact night-coverage [email or web address] 12/11/2019, 3:50 PM

## 2019-12-11 NOTE — TOC Progression Note (Signed)
Transition of Care (TOC) - Progression Note    Patient Details  Name: Amiel Chavez MRN: [id number] Date of Birth: 03/17/1976  Transition of Care (TOC) CM/SW Contact  Deliliah J Gregory, RN Phone Number: 12/11/2019, 1:25 PM  Clinical Narrative:    Met with the patient to discuss DC plan and needs, she has no PCP, I [facility] and explained she needs to call the number to set up appointment application, I sent emailed referral to [facility], I set her up with [facility] to get her medications and explained to here where they are located as well as the paper work needed to cont getting her medications, she stated understanding and that she has no additional needs   Expected Discharge Plan: Home w Home Health Services Barriers to Discharge: Continued Medical Work up  Expected Discharge Plan and Services Expected Discharge Plan: Home w Home Health Services   Discharge Planning Services: CM Consult, Medication Assistance   Living arrangements for the past 2 months: Single Family Home                                       Social Determinants of Health (SDOH) Interventions    Readmission Risk Interventions No flowsheet data found.  

## 2019-12-12 LAB — CBC
HCT: 32.3 % — ABNORMAL LOW (ref 36.0–46.0)
Hemoglobin: 10.6 g/dL — ABNORMAL LOW (ref 12.0–15.0)
MCH: 28.5 pg (ref 26.0–34.0)
MCHC: 32.8 g/dL (ref 30.0–36.0)
MCV: 86.8 fL (ref 80.0–100.0)
Platelets: 353 10*3/uL (ref 150–400)
RBC: 3.72 MIL/uL — ABNORMAL LOW (ref 3.87–5.11)
RDW: 13.2 % (ref 11.5–15.5)
WBC: 12.7 10*3/uL — ABNORMAL HIGH (ref 4.0–10.5)
nRBC: 0 % (ref 0.0–0.2)

## 2019-12-12 LAB — BASIC METABOLIC PANEL
Anion gap: 7 (ref 5–15)
BUN: 24 mg/dL — ABNORMAL HIGH (ref 6–20)
CO2: 16 mmol/L — ABNORMAL LOW (ref 22–32)
Calcium: 7.6 mg/dL — ABNORMAL LOW (ref 8.9–10.3)
Chloride: 111 mmol/L (ref 98–111)
Creatinine, Ser: 3.59 mg/dL — ABNORMAL HIGH (ref 0.44–1.00)
GFR calc Af Amer: 17 mL/min — ABNORMAL LOW (ref >60)
GFR calc non Af Amer: 15 mL/min — ABNORMAL LOW (ref >60)
Glucose, Bld: 241 mg/dL — ABNORMAL HIGH (ref 70–99)
Potassium: 4.6 mmol/L (ref 3.5–5.1)
Sodium: 134 mmol/L — ABNORMAL LOW (ref 135–145)

## 2019-12-12 LAB — URINALYSIS, ROUTINE W REFLEX MICROSCOPIC
Bacteria, UA: NONE SEEN
Bilirubin Urine: NEGATIVE
Glucose, UA: >=500 mg/dL — AB
Ketones, ur: NEGATIVE mg/dL
Leukocytes,Ua: NEGATIVE
Nitrite: NEGATIVE
Protein, ur: >=300 mg/dL — AB
Specific Gravity, Urine: 1.008 (ref 1.005–1.030)
pH: 7 (ref 5.0–8.0)

## 2019-12-12 LAB — GLUCOSE, CAPILLARY
Glucose-Capillary: 272 mg/dL — ABNORMAL HIGH (ref 70–99)
Glucose-Capillary: 211 mg/dL — ABNORMAL HIGH (ref 70–99)
Glucose-Capillary: 172 mg/dL — ABNORMAL HIGH (ref 70–99)
Glucose-Capillary: 330 mg/dL — ABNORMAL HIGH (ref 70–99)

## 2019-12-12 MED ORDER — ENOXAPARIN SODIUM 40 MG/0.4ML ~~LOC~~ SOLN
40.0000 mg | SUBCUTANEOUS | Status: DC
  Administered 2019-12-13: 40 mg via SUBCUTANEOUS
  Filled 2019-12-12: qty 0.4

## 2019-12-12 MED ORDER — INSULIN ASPART 100 UNIT/ML ~~LOC~~ SOLN
0.0000 [IU] | Freq: Three times a day (TID) | SUBCUTANEOUS | Status: DC
  Administered 2019-12-13 (×2): 7 [IU] via SUBCUTANEOUS
  Filled 2019-12-12 (×2): qty 1

## 2019-12-12 MED ORDER — INSULIN DETEMIR 100 UNIT/ML ~~LOC~~ SOLN: 10.0000 [IU] | Freq: Every day | SUBCUTANEOUS | Status: DC

## 2019-12-12 MED ORDER — INSULIN DETEMIR 100 UNIT/ML ~~LOC~~ SOLN
5.0000 [IU] | Freq: Every day | SUBCUTANEOUS | Status: DC
  Administered 2019-12-12: 5 [IU] via SUBCUTANEOUS
  Filled 2019-12-12 (×2): qty 0.05

## 2019-12-12 MED ORDER — PANTOPRAZOLE SODIUM 40 MG PO TBEC
40.0000 mg | DELAYED_RELEASE_TABLET | Freq: Every day | ORAL | Status: DC
  Administered 2019-12-13: 40 mg via ORAL
  Filled 2019-12-12: qty 1

## 2019-12-12 MED ORDER — METOPROLOL TARTRATE 25 MG PO TABS
12.5000 mg | ORAL_TABLET | Freq: Two times a day (BID) | ORAL | Status: DC
  Administered 2019-12-12 – 2019-12-13 (×2): 12.5 mg via ORAL
  Filled 2019-12-12 (×2): qty 1

## 2019-12-12 NOTE — Progress Notes (Signed)
PROGRESS NOTE    [name]  ONG:[id number] DOB: [birth year] DOA: 12/09/2019 PCP: Patient, No Pcp Per   Assessment & Plan:   Principal Problem:   AKI (acute kidney injury) ([place]) Active Problems:   Obesity, Class III, BMI 40-49.9 (morbid obesity) ([place])   Diabetes ([place])   Hypertensive urgency   Chest pain   AKI on CKDIIIb: baseline Cr 1.9. Cr continues to trend up. Nephro consulted. Avoid nephrotoxic meds. Unremarkable renal US. Possibly secondary to home NSAIDs use.   Hyperkalemia: resolved  Metabolic acidosis: will continue on bicarb. Unchanged from day prior  Hypertensive urgency: secondary to medication noncompliance. Continue on amlodipine. IV hydralazine prn   DM2:  poorly controlled. Continue on SSI w/ accuchecks. Carb modified diet  Morbid obesity: BMI 56.3. Would benefit from significant weight loss.  Complicates overall prognosis and care  Leukocytosis: likely reactive. Will continue to monitor   Chest pain: most likely atypical and could be related to GERD. Serial troponins were negative. Continue on pantoprazole   Headache: likely tension. S/p low-dose decadron and 2 g of magnesium sulfate. Previously took NSAIDs at home for headaches. No neurologic deficits. Significant stress at home. Improving   Vomiting: secondary severe HA v.s. hypertensive urgency. XR abd shows no acute findings. Zofran prn   Morbid obesity: BMI 58.3. Complicates overall care & prognosis. Would benefit significantly from weight loss   DVT prophylaxis: lovenox Code Status: full  Family Communication:  Disposition Plan: likely d/c back home   Status is: Inpatient  Remains inpatient appropriate because:Ongoing diagnostic testing needed not appropriate for outpatient work up, Cr is trending up    Dispo: The patient is from: Home              Anticipated d/c is to: Home              Anticipated d/c date is: 2 days              Patient currently is not medically stable to  d/c.      Consultants:   Neuro  nephro   Procedures:    Antimicrobials:   Subjective: Pt c/o malaise  Objective: Vitals:   12/11/19 1100 12/11/19 1552 12/11/19 2029 12/12/19 0031  BP: (!) 158/89 (!) 150/91 (!) 170/92 (!) 150/88  Pulse:  89 96 96  Resp:  16  12  Temp:  98.4 F (36.9 C) 99 F (37.2 C) 98.5 F (36.9 C)  TempSrc:  Oral Oral Oral  SpO2:  100% 99% 98%  Weight:      Height:        Intake/Output Summary (Last 24 hours) at 12/12/2019 0837 Last data filed at 12/11/2019 1358 Gross per 24 hour  Intake 240 ml  Output --  Net 240 ml   Filed Weights   12/08/19 1900 12/09/19 2141  Weight: (!) 135.2 kg (!) 140 kg    Examination:  General exam: Appears calm but uncomfortable. Morbidly obese  Respiratory system: decreased breath sounds b/l otherwise clear. No wheezes Cardiovascular system: S1 & S2 heard. No  murmurs, rubs, gallops or clicks Gastrointestinal system: Abdomen is obese, soft and nontender. Hypoactive bowel sounds heard. Central nervous system: Alert and oriented. Moves all 4 extremities  Psychiatry: Judgement and insight appear normal. Flat mood and affect.     Data Reviewed: I have personally reviewed following labs and imaging studies  CBC: Recent Labs  Lab 12/08/19 1905 12/10/19 0505 12/12/19 0509  WBC 10.2 9.1 12.7*  HGB 11.5* 11.4* 10.6*  HCT 35.1* 35.0* 32.3*  MCV 86.2 87.3 86.8  PLT 320 362 144   Basic Metabolic Panel: Recent Labs  Lab 12/08/19 1905 12/10/19 0505 12/11/19 0513 12/11/19 1127 12/12/19 0509  NA 134* 136 132*  --  134*  K 5.0 5.0 5.5* 5.0 4.6  CL 111 113* 109  --  111  CO2 17* 16* 16*  --  16*  GLUCOSE 251* 172* 277*  --  241*  BUN 22* 17 19  --  24*  CREATININE 3.26* 3.03* 3.10*  --  3.59*  CALCIUM 7.7* 7.9* 8.2*  --  7.6*   GFR: Estimated Creatinine Clearance: 26.7 mL/min (A) (by C-G formula based on SCr of 3.59 mg/dL (H)). Liver Function Tests: Recent Labs  Lab 12/08/19 1905  AST 13*   ALT 10  ALKPHOS 102  BILITOT 0.7  PROT 5.5*  ALBUMIN 2.5*   No results for input(s): LIPASE, AMYLASE in the last 168 hours. No results for input(s): AMMONIA in the last 168 hours. Coagulation Profile: No results for input(s): INR, PROTIME in the last 168 hours. Cardiac Enzymes: No results for input(s): CKTOTAL, CKMB, CKMBINDEX, TROPONINI in the last 168 hours. BNP (last 3 results) No results for input(s): PROBNP in the last 8760 hours. HbA1C: Recent Labs    12/09/19 1153  HGBA1C 10.1*   CBG: Recent Labs  Lab 12/10/19 2124 12/11/19 0734 12/11/19 1141 12/11/19 1628 12/11/19 2012  GLUCAP 285* 229* 260* 220* 248*   Lipid Profile: No results for input(s): CHOL, HDL, LDLCALC, TRIG, CHOLHDL, LDLDIRECT in the last 72 hours. Thyroid Function Tests: No results for input(s): TSH, T4TOTAL, FREET4, T3FREE, THYROIDAB in the last 72 hours. Anemia Panel: No results for input(s): VITAMINB12, FOLATE, FERRITIN, TIBC, IRON, RETICCTPCT in the last 72 hours. Sepsis Labs: No results for input(s): PROCALCITON, LATICACIDVEN in the last 168 hours.  Recent Results (from the past 240 hour(s))  SARS Coronavirus 2 by RT PCR (hospital order, performed in [facility] hospital lab) Nasopharyngeal Nasopharyngeal Swab     Status: None   Collection Time: 12/09/19  7:54 AM   Specimen: Nasopharyngeal Swab  Result Value Ref Range Status   SARS Coronavirus 2 NEGATIVE NEGATIVE Final    Comment: (NOTE) SARS-CoV-2 target nucleic acids are NOT DETECTED.  The SARS-CoV-2 RNA is generally detectable in upper and lower respiratory specimens during the acute phase of infection. The lowest concentration of SARS-CoV-2 viral copies this assay can detect is 250 copies / mL. A negative result does not preclude SARS-CoV-2 infection and should not be used as the sole basis for treatment or other patient management decisions.  A negative result may occur with improper specimen collection / handling, submission of  specimen other than nasopharyngeal swab, presence of viral mutation(s) within the areas targeted by this assay, and inadequate number of viral copies (<250 copies / mL). A negative result must be combined with clinical observations, patient history, and epidemiological information.  Fact Sheet for Patients:   StrictlyIdeas.no  Fact Sheet for Healthcare Providers: BankingDealers.co.za  This test is not yet approved or  cleared by the [place] FDA and has been authorized for detection and/or diagnosis of SARS-CoV-2 by FDA under an Emergency Use Authorization (EUA).  This EUA will remain in effect (meaning this test can be used) for the duration of the COVID-19 declaration under Section 564(b)(1) of the Act, 21 U.S.C. section 360bbb-3(b)(1), unless the authorization is terminated or revoked sooner.  Performed at [facility], [address], [place], [place] [address]  Radiology Studies: DG Abd 1 View  Result Date: 12/10/2019 CLINICAL DATA:  Nausea and vomiting EXAM: ABDOMEN - 1 VIEW COMPARISON:  None. FINDINGS: Bowel gas pattern is unremarkable. No significant stool burden. No acute osseous abnormality. Cholecystectomy clips. IMPRESSION: Negative. Electronically Signed   By: Macy Mis M.D.   On: 12/10/2019 17:11   MR BRAIN WO CONTRAST  Result Date: 12/11/2019 CLINICAL DATA:  Initial evaluation for acute headache and vomiting. EXAM: MRI HEAD WITHOUT CONTRAST MRV HEAD WITHOUT AND WITH CONTRAST TECHNIQUE: Multiplanar, multiecho pulse sequences of the brain and surrounding structures were obtained without and with intravenous contrast. Angiographic images of the intracranial venous structures were obtained using MRV technique without intravenous contrast. CONTRAST:  101mL GADAVIST GADOBUTROL 1 MMOL/ML IV SOLN COMPARISON:  Prior head CT from 12/08/2019. FINDINGS: MRI HEAD FINDINGS: Brain: Cerebral volume within  normal limits for patient age. Mild scattered T2/FLAIR hyperintensity noted within the periventricular white matter, nonspecific, but felt to be within normal limits for age. No abnormal foci of restricted diffusion to suggest acute or subacute ischemia. Gray-white matter differentiation well maintained. No encephalomalacia to suggest chronic infarction. No foci of susceptibility artifact to suggest acute or chronic intracranial hemorrhage. No mass lesion, midline shift or mass effect. No hydrocephalus. No extra-axial fluid collection. Major dural sinuses are grossly patent. Pituitary gland and suprasellar region are normal. Midline structures intact and normal. Vascular: Major intracranial vascular flow voids well maintained and normal in appearance. Skull and upper cervical spine: Craniocervical junction normal. Visualized upper cervical spine within normal limits. Bone marrow signal intensity normal. No scalp soft tissue abnormality. Sinuses/Orbits: Globes and orbital soft tissues within normal limits. Mild scattered mucosal thickening noted within the ethmoidal air cells and maxillary sinuses. Paranasal sinuses are otherwise clear. Trace bilateral mastoid effusions noted, of doubtful significance. Inner ear structures grossly normal. Other: None. MRV HEAD FINDINGS: Normal flow related signal and enhancement seen throughout the superior sagittal sinus to the level of the torcula. Torcula itself is patent. Right transverse and sigmoid sinuses are patent as is the visualized proximal right internal jugular vein. Left transverse sinus diffusely hypoplastic but grossly patent as well. Left sigmoid sinus patent as is the visualized proximal left internal jugular vein. Straight sinus and vein of Galen are patent. Internal cerebral veins grossly patent as well. No findings to suggest dural sinus thrombosis. IMPRESSION: 1. Negative brain MRI. No acute intracranial abnormality identified. 2. Negative intracranial MRV. No  evidence for dural sinus thrombosis. Electronically Signed   By: Jeannine Boga M.D.   On: 12/11/2019 02:25   US RENAL  Result Date: 12/10/2019 CLINICAL DATA:  Acute renal insufficiency. EXAM: RENAL / URINARY TRACT ULTRASOUND COMPLETE COMPARISON:  None. FINDINGS: Right Kidney: Renal measurements: 10.4 x 4.8 x 5.6 cm = volume: 145.9 mL . Normal renal cortical thickness and echogenicity without focal lesions or hydronephrosis. Left Kidney: Renal measurements: 11.7 x 5.1 x 5.3 cm = volume: 166.5 mL. Normal renal cortical thickness and echogenicity without focal lesions or hydronephrosis. Bladder: Appears normal for degree of bladder distention. Other: The exam is somewhat limited by body habitus. IMPRESSION: 1. Unremarkable renal ultrasound examination. No renal lesions or hydronephrosis. 2. Poorly distended bladder but no abnormality. Electronically Signed   By: Marijo Sanes M.D.   On: 12/10/2019 12:24   MR MRV HEAD W WO CONTRAST  Result Date: 12/11/2019 CLINICAL DATA:  Initial evaluation for acute headache and vomiting. EXAM: MRI HEAD WITHOUT CONTRAST MRV HEAD WITHOUT AND WITH CONTRAST TECHNIQUE: Multiplanar, multiecho pulse sequences  of the brain and surrounding structures were obtained without and with intravenous contrast. Angiographic images of the intracranial venous structures were obtained using MRV technique without intravenous contrast. CONTRAST:  51mL GADAVIST GADOBUTROL 1 MMOL/ML IV SOLN COMPARISON:  Prior head CT from 12/08/2019. FINDINGS: MRI HEAD FINDINGS: Brain: Cerebral volume within normal limits for patient age. Mild scattered T2/FLAIR hyperintensity noted within the periventricular white matter, nonspecific, but felt to be within normal limits for age. No abnormal foci of restricted diffusion to suggest acute or subacute ischemia. Gray-white matter differentiation well maintained. No encephalomalacia to suggest chronic infarction. No foci of susceptibility artifact to suggest acute  or chronic intracranial hemorrhage. No mass lesion, midline shift or mass effect. No hydrocephalus. No extra-axial fluid collection. Major dural sinuses are grossly patent. Pituitary gland and suprasellar region are normal. Midline structures intact and normal. Vascular: Major intracranial vascular flow voids well maintained and normal in appearance. Skull and upper cervical spine: Craniocervical junction normal. Visualized upper cervical spine within normal limits. Bone marrow signal intensity normal. No scalp soft tissue abnormality. Sinuses/Orbits: Globes and orbital soft tissues within normal limits. Mild scattered mucosal thickening noted within the ethmoidal air cells and maxillary sinuses. Paranasal sinuses are otherwise clear. Trace bilateral mastoid effusions noted, of doubtful significance. Inner ear structures grossly normal. Other: None. MRV HEAD FINDINGS: Normal flow related signal and enhancement seen throughout the superior sagittal sinus to the level of the torcula. Torcula itself is patent. Right transverse and sigmoid sinuses are patent as is the visualized proximal right internal jugular vein. Left transverse sinus diffusely hypoplastic but grossly patent as well. Left sigmoid sinus patent as is the visualized proximal left internal jugular vein. Straight sinus and vein of Galen are patent. Internal cerebral veins grossly patent as well. No findings to suggest dural sinus thrombosis. IMPRESSION: 1. Negative brain MRI. No acute intracranial abnormality identified. 2. Negative intracranial MRV. No evidence for dural sinus thrombosis. Electronically Signed   By: Jeannine Boga M.D.   On: 12/11/2019 02:25        Scheduled Meds: . amLODipine  10 mg Oral Daily  . enoxaparin (LOVENOX) injection  40 mg Subcutaneous Q12H  . hydrALAZINE  25 mg Oral Q8H  . insulin aspart  0-20 Units Subcutaneous TID WC  . pantoprazole (PROTONIX) IV  40 mg Intravenous Q24H  . sodium bicarbonate  650 mg Oral  BID  . sodium chloride flush  3 mL Intravenous Q12H   Continuous Infusions: . sodium chloride 150 mL/hr at 12/12/19 0329  . sodium chloride       LOS: 3 days    Time spent: 30 mins     Wyvonnia Dusky, MD [facility] Pager [phone]  If 7PM-7AM, please contact night-coverage [email or web address] 12/12/2019, 8:37 AM
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
## 2019-12-12 NOTE — Progress Notes (Signed)
Ch visited with Pt alongside Chaplain. Vinnie Level. Pt looked a lot better today than yesterday. Pt was on the phone with daughter when chaplains walked in. Pt spoke hopefully about Life and moving forward in life. Pt requested prayer for the healing of the body, and for love and kindness in family relationships. Chs prayed with Pt. Pt was grateful for the visit.
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
## 2019-12-12 NOTE — Progress Notes (Signed)
Patient has not voided at this time. Will tell night shift about Ua

## 2019-12-12 NOTE — Progress Notes (Signed)
Anticoagulation monitoring(Lovenox):  43yo  F ordered Lovenox 40 mg Q12h  Filed Weights   12/08/19 1900 12/09/19 2141  Weight: (!) 135.2 kg (298 lb) (!) 140 kg (308 lb 10.3 oz)   BMI 58.2   Lab Results  Component Value Date   CREATININE 3.59 (H) 12/12/2019   CREATININE 3.10 (H) 12/11/2019   CREATININE 3.03 (H) 12/10/2019   Estimated Creatinine Clearance: 26.7 mL/min (A) (by C-G formula based on SCr of 3.59 mg/dL (H)). Hemoglobin & Hematocrit     Component Value Date/Time   HGB 10.6 (L) 12/12/2019 0509   HCT 32.3 (L) 12/12/2019 8833     Per Protocol for Patient with estCrcl < 30 ml/min and BMI > 40, will transition to Lovenox 40 mg Q24h.     Chinita Greenland PharmD Clinical Pharmacist 12/12/2019

## 2019-12-12 NOTE — Progress Notes (Signed)
Pt blood sugar 330. Sharion Settler notified.

## 2019-12-12 NOTE — Plan of Care (Signed)

## 2019-12-13 DIAGNOSIS — E1169 Type 2 diabetes mellitus with other specified complication: Secondary | ICD-10-CM

## 2019-12-13 DIAGNOSIS — I160 Hypertensive urgency: Secondary | ICD-10-CM

## 2019-12-13 LAB — PROTEIN / CREATININE RATIO, URINE
Creatinine, Urine: 36 mg/dL
Protein Creatinine Ratio: 12.97 mg/mg{Cre} — ABNORMAL HIGH (ref 0.00–0.15)
Total Protein, Urine: 467 mg/dL

## 2019-12-13 LAB — CBC
HCT: 32.5 % — ABNORMAL LOW (ref 36.0–46.0)
Hemoglobin: 11 g/dL — ABNORMAL LOW (ref 12.0–15.0)
MCH: 29.7 pg (ref 26.0–34.0)
MCHC: 33.8 g/dL (ref 30.0–36.0)
MCV: 87.8 fL (ref 80.0–100.0)
Platelets: 344 10*3/uL (ref 150–400)
RBC: 3.7 MIL/uL — ABNORMAL LOW (ref 3.87–5.11)
RDW: 13.2 % (ref 11.5–15.5)
WBC: 9.3 10*3/uL (ref 4.0–10.5)
nRBC: 0 % (ref 0.0–0.2)

## 2019-12-13 LAB — BASIC METABOLIC PANEL
Anion gap: 6 (ref 5–15)
BUN: 27 mg/dL — ABNORMAL HIGH (ref 6–20)
CO2: 18 mmol/L — ABNORMAL LOW (ref 22–32)
Calcium: 7.6 mg/dL — ABNORMAL LOW (ref 8.9–10.3)
Chloride: 112 mmol/L — ABNORMAL HIGH (ref 98–111)
Creatinine, Ser: 3.56 mg/dL — ABNORMAL HIGH (ref 0.44–1.00)
GFR calc Af Amer: 17 mL/min — ABNORMAL LOW (ref >60)
GFR calc non Af Amer: 15 mL/min — ABNORMAL LOW (ref >60)
Glucose, Bld: 228 mg/dL — ABNORMAL HIGH (ref 70–99)
Potassium: 4.6 mmol/L (ref 3.5–5.1)
Sodium: 136 mmol/L (ref 135–145)

## 2019-12-13 LAB — GLUCOSE, CAPILLARY: Glucose-Capillary: 230 mg/dL — ABNORMAL HIGH (ref 70–99)

## 2019-12-13 MED ORDER — METOPROLOL TARTRATE 25 MG PO TABS: 12.5000 mg | ORAL_TABLET | Freq: Two times a day (BID) | ORAL | 0 refills | Status: DC

## 2019-12-13 MED ORDER — ALPRAZOLAM 0.5 MG PO TABS: 0.5000 mg | ORAL_TABLET | Freq: Three times a day (TID) | ORAL | Status: DC | PRN

## 2019-12-13 MED ORDER — NOVOLIN R 100 UNIT/ML IJ SOLN: 8.0000 [IU] | Freq: Three times a day (TID) | INTRAMUSCULAR | 11 refills | Status: DC

## 2019-12-13 MED ORDER — HYDRALAZINE HCL 50 MG PO TABS: 50.0000 mg | ORAL_TABLET | Freq: Three times a day (TID) | ORAL | 0 refills | Status: DC

## 2019-12-13 MED ORDER — HYDRALAZINE HCL 50 MG PO TABS
50.0000 mg | ORAL_TABLET | Freq: Three times a day (TID) | ORAL | Status: DC
  Administered 2019-12-13: 50 mg via ORAL
  Filled 2019-12-13: qty 1

## 2019-12-13 MED ORDER — AMLODIPINE BESYLATE 10 MG PO TABS: 10.0000 mg | ORAL_TABLET | Freq: Every day | ORAL | 0 refills | Status: DC

## 2019-12-13 MED ORDER — LEVEMIR FLEXTOUCH 100 UNIT/ML ~~LOC~~ SOPN: 7.0000 [IU] | PEN_INJECTOR | Freq: Every day | SUBCUTANEOUS | 11 refills | Status: DC

## 2019-12-13 MED ORDER — INSULIN PEN NEEDLE 30G X 8 MM MISC: 1.0000 | 0 refills | Status: AC | PRN

## 2019-12-13 MED ORDER — "INSULIN SYRINGE-NEEDLE U-100 29G X 1"" 0.3 ML MISC": 0 refills | Status: DC

## 2019-12-13 NOTE — Progress Notes (Signed)
Patient IV removed from RFA. Discharge instructions given to patient, as well as follow up appointments, Transported to car with daughter.

## 2019-12-13 NOTE — Progress Notes (Signed)
Subjective: Headache nearly   Past Medical History:  Diagnosis Date  . Asthma   . Diabetes ([place])   . Hypertension     Past Surgical History:  Procedure Laterality Date  . CESAREAN SECTION N/A   . CHOLECYSTECTOMY      Family History  Problem Relation Age of Onset  . Heart attack Father        died at ~45 years of age    Social History:  reports that she has never smoked. She has never used smokeless tobacco. She reports that she does not drink alcohol. No history on file for drug use.  Allergies  Allergen Reactions  . Cholesterol Rash    Cholesterol medicine    Medications:  Prior to Admission:  Medications Prior to Admission  Medication Sig Dispense Refill Last Dose  . amLODipine (NORVASC) 5 MG tablet Take 5 mg by mouth daily.     Marland Kitchen LEVEMIR FLEXTOUCH 100 UNIT/ML FlexPen Inject 7 Units into the skin at bedtime.   12/08/2019 at Unknown time  . benzonatate (TESSALON) 100 MG capsule Take 100 mg by mouth 3 (three) times daily.   prn at prn  . cetirizine (ZYRTEC) 10 MG tablet Take 10 mg by mouth daily.   prn at prn  . NOVOLIN R 100 UNIT/ML injection Inject 8 Units into the skin with breakfast, with lunch, and with evening meal.   12/07/2019 at 1800  . PROAIR HFA 108 (90 Base) MCG/ACT inhaler Inhale 1-2 puffs into the lungs every 6 (six) hours as needed for wheezing or shortness of breath.   prn at prn     Physical Examination: Blood pressure (!) 158/67, pulse 86, temperature 98.2 F (36.8 C), temperature source Oral, resp. rate 16, height 5\' 1"  (1.549 m), weight (!) 140 kg, last menstrual period 12/08/2019, SpO2 100 %.   Neurological Examination   Mental Status: Alert, oriented, thought content appropriate.  Speech fluent without evidence of aphasia.  Able to follow 3 step commands without difficulty. Cranial Nerves: II: Discs flat bilaterally; Visual fields grossly normal, pupils equal, round, reactive to light and accommodation III,IV, VI: ptosis not present,  extra-ocular motions intact bilaterally V,VII: smile symmetric, facial light touch sensation normal bilaterally VIII: hearing normal bilaterally IX,X: gag reflex present XI: bilateral shoulder shrug XII: midline tongue extension Motor: Right : Upper extremity   5/5    Left:     Upper extremity   5/5  Lower extremity   5/5     Lower extremity   5/5 Tone and bulk:normal tone throughout; no atrophy noted Sensory: Pinprick and light touch intact throughout, bilaterally Deep Tendon Reflexes: 1+ and symmetric throughout Plantars: Right: downgoing   Left: downgoing Cerebellar: normal finger-to-nose Gait: not tested       Laboratory Studies:   Basic Metabolic Panel: Recent Labs  Lab 12/08/19 1905 12/08/19 1905 12/10/19 0505 12/10/19 0505 12/11/19 0513 12/11/19 1127 12/12/19 0509 12/13/19 0514  NA 134*  --  136  --  132*  --  134* 136  K 5.0   < > 5.0  --  5.5* 5.0 4.6 4.6  CL 111  --  113*  --  109  --  111 112*  CO2 17*  --  16*  --  16*  --  16* 18*  GLUCOSE 251*  --  172*  --  277*  --  241* 228*  BUN 22*  --  17  --  19  --  24* 27*  CREATININE 3.26*  --  3.03*  --  3.10*  --  3.59* 3.56*  CALCIUM 7.7*   < > 7.9*   < > 8.2*  --  7.6* 7.6*   < > = values in this interval not displayed.    Liver Function Tests: Recent Labs  Lab 12/08/19 1905  AST 13*  ALT 10  ALKPHOS 102  BILITOT 0.7  PROT 5.5*  ALBUMIN 2.5*   No results for input(s): LIPASE, AMYLASE in the last 168 hours. No results for input(s): AMMONIA in the last 168 hours.  CBC: Recent Labs  Lab 12/08/19 1905 12/10/19 0505 12/12/19 0509 12/13/19 0514  WBC 10.2 9.1 12.7* 9.3  HGB 11.5* 11.4* 10.6* 11.0*  HCT 35.1* 35.0* 32.3* 32.5*  MCV 86.2 87.3 86.8 87.8  PLT 320 362 353 344    Cardiac Enzymes: No results for input(s): CKTOTAL, CKMB, CKMBINDEX, TROPONINI in the last 168 hours.  BNP: Invalid input(s): POCBNP  CBG: Recent Labs  Lab 12/11/19 2012 12/12/19 0905 12/12/19 1139  12/12/19 1628 12/12/19 2053  GLUCAP 248* 272* 211* 172* 330*    Microbiology: Results for orders placed or performed during the hospital encounter of 12/09/19  SARS Coronavirus 2 by RT PCR (hospital order, performed in [facility] hospital lab) Nasopharyngeal Nasopharyngeal Swab     Status: None   Collection Time: 12/09/19  7:54 AM   Specimen: Nasopharyngeal Swab  Result Value Ref Range Status   SARS Coronavirus 2 NEGATIVE NEGATIVE Final    Comment: (NOTE) SARS-CoV-2 target nucleic acids are NOT DETECTED.  The SARS-CoV-2 RNA is generally detectable in upper and lower respiratory specimens during the acute phase of infection. The lowest concentration of SARS-CoV-2 viral copies this assay can detect is 250 copies / mL. A negative result does not preclude SARS-CoV-2 infection and should not be used as the sole basis for treatment or other patient management decisions.  A negative result may occur with improper specimen collection / handling, submission of specimen other than nasopharyngeal swab, presence of viral mutation(s) within the areas targeted by this assay, and inadequate number of viral copies (<250 copies / mL). A negative result must be combined with clinical observations, patient history, and epidemiological information.  Fact Sheet for Patients:   StrictlyIdeas.no  Fact Sheet for Healthcare Providers: BankingDealers.co.za  This test is not yet approved or  cleared by the [place] FDA and has been authorized for detection and/or diagnosis of SARS-CoV-2 by FDA under an Emergency Use Authorization (EUA).  This EUA will remain in effect (meaning this test can be used) for the duration of the COVID-19 declaration under Section 564(b)(1) of the Act, 21 U.S.C. section 360bbb-3(b)(1), unless the authorization is terminated or revoked sooner.  Performed at [facility], [address] [place], [place]  [address]     Coagulation Studies: No results for input(s): LABPROT, INR in the last 72 hours.  Urinalysis:  Recent Labs  Lab 12/12/19 2254  COLORURINE STRAW*  LABSPEC 1.008  PHURINE 7.0  GLUCOSEU >=500*  HGBUR LARGE*  BILIRUBINUR NEGATIVE  KETONESUR NEGATIVE  PROTEINUR >=300*  NITRITE NEGATIVE  LEUKOCYTESUR NEGATIVE    Lipid Panel:  No results found for: CHOL, TRIG, HDL, CHOLHDL, VLDL, LDLCALC  HgbA1C:  Lab Results  Component Value Date   HGBA1C 10.1 (H) 12/09/2019    Urine Drug Screen:  No results found for: LABOPIA, COCAINSCRNUR, LABBENZ, AMPHETMU, THCU, LABBARB  Alcohol Level: No results for input(s): ETH in the last 168 hours.  Other results: EKG: normal EKG, normal sinus  rhythm, unchanged from previous tracings.  Imaging: No results found.   Assessment/Plan:  44 y.o. female with medical history significant for diabetes mellitus with chronic kidney disease, morbid obesity,  hypertension and asthma who presents to the emergency room for evaluation of headache and vomiting which she has had according to her for months.  She describes the headache as an intense pain over the temporal and frontal part of her skull and radiating to the eyes which she rates a 6 x 10 in intensity at its worst. She denies having a history of migraine headaches and states that the pain has improved since she came into the emergency room. She also complains of burning sensation in her chest associated with nausea and multiple episodes of vomiting.  She denies having any abdominal pain or any changes in her   - Current headache has improved and nearly resolved  - s/p MRI and MRV without any acute abnormalities.  - This is likely tension type of headache - likely d/c planning from Neurological stand point. Awaiting AKI improvement and is on hydration  - call if any questions.     12/13/2019, 11:10 AM

## 2019-12-13 NOTE — Discharge Summary (Signed)
Physician Discharge Summary  Orvetta Danielski XIP:[id number] DOB: 11/01/1975 DOA: 12/09/2019  PCP: Patient, No Pcp Per  Admit date: 12/09/2019 Discharge date: 12/13/2019  Admitted From: home  Disposition:  home  Recommendations for Outpatient Follow-up:  1. Follow up with PCP in 1-2 weeks 2. F/u nephro in 2 weeks  Home Health: no  Equipment/Devices:  Discharge Condition: stable  CODE STATUS: full  Diet recommendation: Heart Healthy / Carb Modified   Brief/Interim Summary: HPI was taken from Dr. Francine Graven: Bernise Sylvain is a 44 y.o. female with medical history significant for diabetes mellitus with chronic kidney disease, morbid obesity, diabetes mellitus, hypertension and asthma who presents to the emergency room for evaluation of headache and vomiting which she has had according to her for months.  She describes the headache as an intense pain over the temporal and frontal part of her skull and radiating to the eyes which she rates a 6 x 10 in intensity at its worst. She denies having a history of migraine headaches and states that the pain has improved since she came into the emergency room..  She has not had her vision checked.  Patient admits to a lot of stress at home and has not been taking any of her medications.  She also complains of burning sensation in her chest associated with nausea and multiple episodes of vomiting.  She denies having any abdominal pain or any changes in her bowel habits. She has no fever, no chills, no shortness of breath, no cough, no urinary symptoms. Upon arrival to the ER she was noted to have significantly elevated blood pressure 168/107. Labs reveal a serum creatinine of 3.26 compared to baseline of 1.9 in January 2021, serum glucose of 251, bicarbonate level of 17. CT scan of the head done without contrast shows no acute findings Twelve-lead EKG read by me shows normal sinus rhythm, low voltage QRS and left axis deviation Chest x-ray read by me shows no  infiltrate or effusion.    ED Course: Patient is a 44 year old Hispanic female who presents to the emergency room with multiple complaints but will be admitted to the hospitalist service for acute kidney injury.  Patient at baseline has a serum creatinine of 1.9 but today on admission it is 3.26. She will be admitted to the hospital for further evaluation.  Hospital Course from Dr. Lenise Herald 7/28-7/30/21: Pt was found to have AKI on CKD likely secondary to NSAID use, uncontrolled DM2 & HTN. Pt's Cr was trending down slightly on the day of d/c. Pt will f/u outpatient w/ nephro. Pt verbalized her understanding. Furthermore, pt was sent refills of her medications, BP & anti-DM meds to medication management as pt does not have health insurance. Pt [facility] also [facility].   Discharge Diagnoses:  Principal Problem:   AKI (acute kidney injury) ([place]) Active Problems:   Obesity, Class III, BMI 40-49.9 (morbid obesity) ([place])   Diabetes ([place])   Hypertensive urgency   Chest pain  AKI on CKDIV: as per nephro. Baseline Cr 1.9. Cr is trending down slightly today. Avoid nephrotoxic meds. Unremarkable renal US. Possibly secondary to home NSAIDs use. Nephro following and recs apprec   Hyperkalemia: resolved  Metabolic acidosis: improving so will continue on bicarb.   Hypertensive urgency: secondary to medication noncompliance. Continue on amlodipine, hydralazine & metoprolol. Increased hydralazine to 50mg  Q8H  DM2:  poorly controlled. Continue on SSI w/ accuchecks. Carb modified diet  Morbid obesity: BMI 56.3. Would benefit  from significant weight loss.  Complicates overall prognosis and care  Leukocytosis: resolved   Chest pain: most likely atypical and could be related to GERD. Serial troponins were negative. Continue on pantoprazole   Headache: likely tension. S/p low-dose decadron and 2 g of magnesium sulfate. Previously took NSAIDs at home for  headaches. No neurologic deficits. Significant stress at home. Resolved  Vomiting: resolved  Discharge Instructions  Discharge Instructions    Diet - low sodium heart healthy   Complete by: As directed    Diet Carb Modified   Complete by: As directed    Discharge instructions   Complete by: As directed    F/u PCP in 1 week. F/u nephro, Dr. Lemmie Evens. Singh in 2 weeks   Increase activity slowly   Complete by: As directed      Allergies as of 12/13/2019      Reactions   Cholesterol Rash   Cholesterol medicine      Medication List    TAKE these medications   amLODipine 10 MG tablet Commonly known as: NORVASC Take 1 tablet (10 mg total) by mouth daily. Start taking on: December 14, 2019 What changed:   medication strength  how much to take   benzonatate 100 MG capsule Commonly known as: TESSALON Take 100 mg by mouth 3 (three) times daily. Notes to patient: Not given during hospital stay    cetirizine 10 MG tablet Commonly known as: ZYRTEC Take 10 mg by mouth daily. Notes to patient: Not given during hospital stay    hydrALAZINE 50 MG tablet Commonly known as: APRESOLINE Take 1 tablet (50 mg total) by mouth every 8 (eight) hours.   Insulin Pen Needle 30G X 8 MM Misc Commonly known as: NOVOFINE Inject 10 each into the skin as needed. Notes to patient: May take as needed    Insulin Syringe-Needle U-100 29G X 1" 0.3 ML Misc 8 units of novolin TID w/ meals.   Levemir FlexTouch 100 UNIT/ML FlexPen Generic drug: insulin detemir Inject 7 Units into the skin at bedtime.   metoprolol tartrate 25 MG tablet Commonly known as: LOPRESSOR Take 0.5 tablets (12.5 mg total) by mouth 2 (two) times daily.   NovoLIN R 100 units/mL injection Generic drug: insulin regular Inject 0.08 mLs (8 Units total) into the skin with breakfast, with lunch, and with evening meal.   ProAir HFA 108 (90 Base) MCG/ACT inhaler Generic drug: albuterol Inhale 1-2 puffs into the lungs every 6 (six)  hours as needed for wheezing or shortness of breath. Notes to patient: Not given during hospital stay        Allergies  Allergen Reactions  . Cholesterol Rash    Cholesterol medicine    Consultations:  nephro   Procedures/Studies: DG Chest 2 View  Result Date: 12/08/2019 CLINICAL DATA:  45 year old female with hypertension and headaches. EXAM: CHEST - 2 VIEW COMPARISON:  None. FINDINGS: No focal consolidation, pleural effusion, or pneumothorax. The cardiac silhouette is within limits. No acute osseous pathology. IMPRESSION: No active cardiopulmonary disease. Electronically Signed   By: Anner Crete M.D.   On: 12/08/2019 23:53   DG Abd 1 View  Result Date: 12/10/2019 CLINICAL DATA:  Nausea and vomiting EXAM: ABDOMEN - 1 VIEW COMPARISON:  None. FINDINGS: Bowel gas pattern is unremarkable. No significant stool burden. No acute osseous abnormality. Cholecystectomy clips. IMPRESSION: Negative. Electronically Signed   By: Macy Mis M.D.   On: 12/10/2019 17:11   CT Head Wo Contrast  Result Date: 12/08/2019 CLINICAL DATA:  44 year old female with dizziness. EXAM: CT HEAD WITHOUT CONTRAST TECHNIQUE: Contiguous axial images were obtained from the base of the skull through the vertex without intravenous contrast. COMPARISON:  None. FINDINGS: Brain: The ventricles and sulci appropriate size for patient's age. The gray-white matter discrimination is preserved. There is no acute intracranial hemorrhage. No mass effect or midline shift. No extra-axial fluid collection. Vascular: No hyperdense vessel or unexpected calcification. Skull: Normal. Negative for fracture or focal lesion. Sinuses/Orbits: No acute finding. Other: None IMPRESSION: Unremarkable noncontrast CT of the brain. Electronically Signed   By: Anner Crete M.D.   On: 12/08/2019 23:38   MR BRAIN WO CONTRAST  Result Date: 12/11/2019 CLINICAL DATA:  Initial evaluation for acute headache and vomiting. EXAM: MRI HEAD WITHOUT  CONTRAST MRV HEAD WITHOUT AND WITH CONTRAST TECHNIQUE: Multiplanar, multiecho pulse sequences of the brain and surrounding structures were obtained without and with intravenous contrast. Angiographic images of the intracranial venous structures were obtained using MRV technique without intravenous contrast. CONTRAST:  30mL GADAVIST GADOBUTROL 1 MMOL/ML IV SOLN COMPARISON:  Prior head CT from 12/08/2019. FINDINGS: MRI HEAD FINDINGS: Brain: Cerebral volume within normal limits for patient age. Mild scattered T2/FLAIR hyperintensity noted within the periventricular white matter, nonspecific, but felt to be within normal limits for age. No abnormal foci of restricted diffusion to suggest acute or subacute ischemia. Gray-white matter differentiation well maintained. No encephalomalacia to suggest chronic infarction. No foci of susceptibility artifact to suggest acute or chronic intracranial hemorrhage. No mass lesion, midline shift or mass effect. No hydrocephalus. No extra-axial fluid collection. Major dural sinuses are grossly patent. Pituitary gland and suprasellar region are normal. Midline structures intact and normal. Vascular: Major intracranial vascular flow voids well maintained and normal in appearance. Skull and upper cervical spine: Craniocervical junction normal. Visualized upper cervical spine within normal limits. Bone marrow signal intensity normal. No scalp soft tissue abnormality. Sinuses/Orbits: Globes and orbital soft tissues within normal limits. Mild scattered mucosal thickening noted within the ethmoidal air cells and maxillary sinuses. Paranasal sinuses are otherwise clear. Trace bilateral mastoid effusions noted, of doubtful significance. Inner ear structures grossly normal. Other: None. MRV HEAD FINDINGS: Normal flow related signal and enhancement seen throughout the superior sagittal sinus to the level of the torcula. Torcula itself is patent. Right transverse and sigmoid sinuses are patent as  is the visualized proximal right internal jugular vein. Left transverse sinus diffusely hypoplastic but grossly patent as well. Left sigmoid sinus patent as is the visualized proximal left internal jugular vein. Straight sinus and vein of Galen are patent. Internal cerebral veins grossly patent as well. No findings to suggest dural sinus thrombosis. IMPRESSION: 1. Negative brain MRI. No acute intracranial abnormality identified. 2. Negative intracranial MRV. No evidence for dural sinus thrombosis. Electronically Signed   By: Jeannine Boga M.D.   On: 12/11/2019 02:25   US RENAL  Result Date: 12/10/2019 CLINICAL DATA:  Acute renal insufficiency. EXAM: RENAL / URINARY TRACT ULTRASOUND COMPLETE COMPARISON:  None. FINDINGS: Right Kidney: Renal measurements: 10.4 x 4.8 x 5.6 cm = volume: 145.9 mL . Normal renal cortical thickness and echogenicity without focal lesions or hydronephrosis. Left Kidney: Renal measurements: 11.7 x 5.1 x 5.3 cm = volume: 166.5 mL. Normal renal cortical thickness and echogenicity without focal lesions or hydronephrosis. Bladder: Appears normal for degree of bladder distention. Other: The exam is somewhat limited by body habitus. IMPRESSION: 1. Unremarkable renal ultrasound examination. No renal lesions or hydronephrosis. 2. Poorly distended bladder but no abnormality. Electronically Signed  By: Marijo Sanes M.D.   On: 12/10/2019 12:24   MR MRV HEAD W WO CONTRAST  Result Date: 12/11/2019 CLINICAL DATA:  Initial evaluation for acute headache and vomiting. EXAM: MRI HEAD WITHOUT CONTRAST MRV HEAD WITHOUT AND WITH CONTRAST TECHNIQUE: Multiplanar, multiecho pulse sequences of the brain and surrounding structures were obtained without and with intravenous contrast. Angiographic images of the intracranial venous structures were obtained using MRV technique without intravenous contrast. CONTRAST:  68mL GADAVIST GADOBUTROL 1 MMOL/ML IV SOLN COMPARISON:  Prior head CT from 12/08/2019.  FINDINGS: MRI HEAD FINDINGS: Brain: Cerebral volume within normal limits for patient age. Mild scattered T2/FLAIR hyperintensity noted within the periventricular white matter, nonspecific, but felt to be within normal limits for age. No abnormal foci of restricted diffusion to suggest acute or subacute ischemia. Gray-white matter differentiation well maintained. No encephalomalacia to suggest chronic infarction. No foci of susceptibility artifact to suggest acute or chronic intracranial hemorrhage. No mass lesion, midline shift or mass effect. No hydrocephalus. No extra-axial fluid collection. Major dural sinuses are grossly patent. Pituitary gland and suprasellar region are normal. Midline structures intact and normal. Vascular: Major intracranial vascular flow voids well maintained and normal in appearance. Skull and upper cervical spine: Craniocervical junction normal. Visualized upper cervical spine within normal limits. Bone marrow signal intensity normal. No scalp soft tissue abnormality. Sinuses/Orbits: Globes and orbital soft tissues within normal limits. Mild scattered mucosal thickening noted within the ethmoidal air cells and maxillary sinuses. Paranasal sinuses are otherwise clear. Trace bilateral mastoid effusions noted, of doubtful significance. Inner ear structures grossly normal. Other: None. MRV HEAD FINDINGS: Normal flow related signal and enhancement seen throughout the superior sagittal sinus to the level of the torcula. Torcula itself is patent. Right transverse and sigmoid sinuses are patent as is the visualized proximal right internal jugular vein. Left transverse sinus diffusely hypoplastic but grossly patent as well. Left sigmoid sinus patent as is the visualized proximal left internal jugular vein. Straight sinus and vein of Galen are patent. Internal cerebral veins grossly patent as well. No findings to suggest dural sinus thrombosis. IMPRESSION: 1. Negative brain MRI. No acute  intracranial abnormality identified. 2. Negative intracranial MRV. No evidence for dural sinus thrombosis. Electronically Signed   By: Jeannine Boga M.D.   On: 12/11/2019 02:25       Subjective: Pt c/o anxiety    Discharge Exam: Vitals:   12/13/19 0822 12/13/19 1424  BP: (!) 158/67 (!) 150/70  Pulse: 86 85  Resp: 16 15  Temp: 98.2 F (36.8 C) 98 F (36.7 C)  SpO2: 100% 100%   Vitals:   12/12/19 2122 12/12/19 2353 12/13/19 0822 12/13/19 1424  BP: (!) 162/96 (!) 171/89 (!) 158/67 (!) 150/70  Pulse: 97 84 86 85  Resp:  17 16 15   Temp:  97.8 F (36.6 C) 98.2 F (36.8 C) 98 F (36.7 C)  TempSrc:  Oral Oral Oral  SpO2:  100% 100% 100%  Weight:      Height:        General: Pt is alert, awake, not in acute distress Cardiovascular:  S1/S2 +, no rubs, no gallops Respiratory: decreased breath sounds b/l, no wheezing, no rhonchi Abdominal: Soft, NT, obese, bowel sounds + Extremities: no cyanosis    The results of significant diagnostics from this hospitalization (including imaging, microbiology, ancillary and laboratory) are listed below for reference.     Microbiology: Recent Results (from the past 240 hour(s))  SARS Coronavirus 2 by RT PCR (hospital order, performed  in [place] lab) Nasopharyngeal Nasopharyngeal Swab     Status: None   Collection Time: 12/09/19  7:54 AM   Specimen: Nasopharyngeal Swab  Result Value Ref Range Status   SARS Coronavirus 2 NEGATIVE NEGATIVE Final    Comment: (NOTE) SARS-CoV-2 target nucleic acids are NOT DETECTED.  The SARS-CoV-2 RNA is generally detectable in upper and lower respiratory specimens during the acute phase of infection. The lowest concentration of SARS-CoV-2 viral copies this assay can detect is 250 copies / mL. A negative result does not preclude SARS-CoV-2 infection and should not be used as the sole basis for treatment or other patient management decisions.  A negative result may occur with improper  specimen collection / handling, submission of specimen other than nasopharyngeal swab, presence of viral mutation(s) within the areas targeted by this assay, and inadequate number of viral copies (<250 copies / mL). A negative result must be combined with clinical observations, patient history, and epidemiological information.  Fact Sheet for Patients:   StrictlyIdeas.no  Fact Sheet for Healthcare Providers: BankingDealers.co.za  This test is not yet approved or  cleared by the [place] FDA and has been authorized for detection and/or diagnosis of SARS-CoV-2 by FDA under an Emergency Use Authorization (EUA).  This EUA will remain in effect (meaning this test can be used) for the duration of the COVID-19 declaration under Section 564(b)(1) of the Act, 21 U.S.C. section 360bbb-3(b)(1), unless the authorization is terminated or revoked sooner.  Performed at [facility], [address] [place],  [address]      Labs: BNP (last 3 results) No results for input(s): BNP in the last 8760 hours. Basic Metabolic Panel: Recent Labs  Lab 12/08/19 1905 12/08/19 1905 12/10/19 0505 12/11/19 0513 12/11/19 1127 12/12/19 0509 12/13/19 0514  NA 134*  --  136 132*  --  134* 136  K 5.0   < > 5.0 5.5* 5.0 4.6 4.6  CL 111  --  113* 109  --  111 112*  CO2 17*  --  16* 16*  --  16* 18*  GLUCOSE 251*  --  172* 277*  --  241* 228*  BUN 22*  --  17 19  --  24* 27*  CREATININE 3.26*  --  3.03* 3.10*  --  3.59* 3.56*  CALCIUM 7.7*  --  7.9* 8.2*  --  7.6* 7.6*   < > = values in this interval not displayed.   Liver Function Tests: Recent Labs  Lab 12/08/19 1905  AST 13*  ALT 10  ALKPHOS 102  BILITOT 0.7  PROT 5.5*  ALBUMIN 2.5*   No results for input(s): LIPASE, AMYLASE in the last 168 hours. No results for input(s): AMMONIA in the last 168 hours. CBC: Recent Labs  Lab 12/08/19 1905 12/10/19 0505 12/12/19 0509  12/13/19 0514  WBC 10.2 9.1 12.7* 9.3  HGB 11.5* 11.4* 10.6* 11.0*  HCT 35.1* 35.0* 32.3* 32.5*  MCV 86.2 87.3 86.8 87.8  PLT 320 362 353 344   Cardiac Enzymes: No results for input(s): CKTOTAL, CKMB, CKMBINDEX, TROPONINI in the last 168 hours. BNP: Invalid input(s): POCBNP CBG: Recent Labs  Lab 12/12/19 0905 12/12/19 1139 12/12/19 1628 12/12/19 2053 12/13/19 1130  GLUCAP 272* 211* 172* 330* 230*   D-Dimer No results for input(s): DDIMER in the last 72 hours. Hgb A1c No results for input(s): HGBA1C in the last 72 hours. Lipid Profile No results for input(s): CHOL, HDL, LDLCALC, TRIG, CHOLHDL, LDLDIRECT in the last  72 hours. Thyroid function studies No results for input(s): TSH, T4TOTAL, T3FREE, THYROIDAB in the last 72 hours.  Invalid input(s): FREET3 Anemia work up No results for input(s): VITAMINB12, FOLATE, FERRITIN, TIBC, IRON, RETICCTPCT in the last 72 hours. Urinalysis    Component Value Date/Time   COLORURINE STRAW (A) 12/12/2019 2254   APPEARANCEUR CLEAR (A) 12/12/2019 2254   LABSPEC 1.008 12/12/2019 2254   PHURINE 7.0 12/12/2019 2254   GLUCOSEU >=500 (A) 12/12/2019 2254   HGBUR LARGE (A) 12/12/2019 2254   BILIRUBINUR NEGATIVE 12/12/2019 2254   KETONESUR NEGATIVE 12/12/2019 2254   PROTEINUR >=300 (A) 12/12/2019 2254   NITRITE NEGATIVE 12/12/2019 2254   LEUKOCYTESUR NEGATIVE 12/12/2019 2254   Sepsis Labs Invalid input(s): PROCALCITONIN,  WBC,  LACTICIDVEN Microbiology Recent Results (from the past 240 hour(s))  SARS Coronavirus 2 by RT PCR (hospital order, performed in [place] hospital lab) Nasopharyngeal Nasopharyngeal Swab     Status: None   Collection Time: 12/09/19  7:54 AM   Specimen: Nasopharyngeal Swab  Result Value Ref Range Status   SARS Coronavirus 2 NEGATIVE NEGATIVE Final    Comment: (NOTE) SARS-CoV-2 target nucleic acids are NOT DETECTED.  The SARS-CoV-2 RNA is generally detectable in upper and lower respiratory specimens during  the acute phase of infection. The lowest concentration of SARS-CoV-2 viral copies this assay can detect is 250 copies / mL. A negative result does not preclude SARS-CoV-2 infection and should not be used as the sole basis for treatment or other patient management decisions.  A negative result may occur with improper specimen collection / handling, submission of specimen other than nasopharyngeal swab, presence of viral mutation(s) within the areas targeted by this assay, and inadequate number of viral copies (<250 copies / mL). A negative result must be combined with clinical observations, patient history, and epidemiological information.  Fact Sheet for Patients:   StrictlyIdeas.no  Fact Sheet for Healthcare Providers: BankingDealers.co.za  This test is not yet approved or  cleared by the [place] FDA and has been authorized for detection and/or diagnosis of SARS-CoV-2 by FDA under an Emergency Use Authorization (EUA).  This EUA will remain in effect (meaning this test can be used) for the duration of the COVID-19 declaration under Section 564(b)(1) of the Act, 21 U.S.C. section 360bbb-3(b)(1), unless the authorization is terminated or revoked sooner.  Performed at [facility], [address] [place], [place] [address]      Time coordinating discharge: Over 30 minutes  SIGNED:   Wyvonnia Dusky, MD  [facility] 12/13/2019, 3:27 PM Pager   If 7PM-7AM, please contact night-coverage [email or web address]

## 2019-12-13 NOTE — Plan of Care (Signed)
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
## 2019-12-14 LAB — ANA W/REFLEX IF POSITIVE: Anti Nuclear Antibody (ANA): NEGATIVE

## 2019-12-14 LAB — GLUCOSE, CAPILLARY: Glucose-Capillary: 225 mg/dL — ABNORMAL HIGH (ref 70–99)

## 2020-02-10 DIAGNOSIS — E559 Vitamin D deficiency, unspecified: Secondary | ICD-10-CM | POA: Insufficient documentation

## 2020-02-17 DIAGNOSIS — E8809 Other disorders of plasma-protein metabolism, not elsewhere classified: Secondary | ICD-10-CM | POA: Insufficient documentation

## 2020-02-17 DIAGNOSIS — N185 Chronic kidney disease, stage 5: Secondary | ICD-10-CM | POA: Insufficient documentation

## 2020-02-27 DIAGNOSIS — E872 Acidosis, unspecified: Secondary | ICD-10-CM | POA: Insufficient documentation

## 2020-03-26 ENCOUNTER — Telehealth: Payer: Self-pay | Admitting: Pharmacist

## 2020-03-26 NOTE — Telephone Encounter (Signed)
Patient failed to provide requested 2021 financial documentation. No additional medication assistance will be provided by [facility] without the required proof of income documentation. Patient notified by letter Debra Cheek Administrative Assistant [facility] 

## 2020-07-07 DIAGNOSIS — I10 Essential (primary) hypertension: Secondary | ICD-10-CM | POA: Insufficient documentation

## 2020-07-07 DIAGNOSIS — D638 Anemia in other chronic diseases classified elsewhere: Secondary | ICD-10-CM | POA: Insufficient documentation

## 2020-08-23 ENCOUNTER — Inpatient Hospital Stay
Admission: EM | Admit: 2020-08-23 | Discharge: 2020-09-02 | DRG: 674 | Disposition: A | Discharge status: Home / Self Care | Payer: Self-pay | Attending: Internal Medicine | Admitting: Internal Medicine

## 2020-08-23 ENCOUNTER — Other Ambulatory Visit: Payer: Self-pay

## 2020-08-23 ENCOUNTER — Emergency Department: Payer: Self-pay

## 2020-08-23 DIAGNOSIS — N2581 Secondary hyperparathyroidism of renal origin: Secondary | ICD-10-CM | POA: Diagnosis present

## 2020-08-23 DIAGNOSIS — K5900 Constipation, unspecified: Secondary | ICD-10-CM | POA: Diagnosis present

## 2020-08-23 DIAGNOSIS — Z28310 Unvaccinated for covid-19: Secondary | ICD-10-CM

## 2020-08-23 DIAGNOSIS — Z87891 Personal history of nicotine dependence: Secondary | ICD-10-CM

## 2020-08-23 DIAGNOSIS — I120 Hypertensive chronic kidney disease with stage 5 chronic kidney disease or end stage renal disease: Secondary | ICD-10-CM | POA: Diagnosis present

## 2020-08-23 DIAGNOSIS — Z9119 Patient's noncompliance with other medical treatment and regimen: Secondary | ICD-10-CM

## 2020-08-23 DIAGNOSIS — E119 Type 2 diabetes mellitus without complications: Secondary | ICD-10-CM

## 2020-08-23 DIAGNOSIS — E66813 Obesity, class 3: Secondary | ICD-10-CM | POA: Diagnosis present

## 2020-08-23 DIAGNOSIS — E8881 Metabolic syndrome: Secondary | ICD-10-CM | POA: Diagnosis present

## 2020-08-23 DIAGNOSIS — Z9114 Patient's other noncompliance with medication regimen: Secondary | ICD-10-CM

## 2020-08-23 DIAGNOSIS — I160 Hypertensive urgency: Secondary | ICD-10-CM | POA: Diagnosis present

## 2020-08-23 DIAGNOSIS — N200 Calculus of kidney: Secondary | ICD-10-CM | POA: Diagnosis present

## 2020-08-23 DIAGNOSIS — Z992 Dependence on renal dialysis: Secondary | ICD-10-CM | POA: Diagnosis present

## 2020-08-23 DIAGNOSIS — N186 End stage renal disease: Secondary | ICD-10-CM | POA: Diagnosis present

## 2020-08-23 DIAGNOSIS — Z20822 Contact with and (suspected) exposure to covid-19: Secondary | ICD-10-CM | POA: Diagnosis present

## 2020-08-23 DIAGNOSIS — Z794 Long term (current) use of insulin: Secondary | ICD-10-CM

## 2020-08-23 DIAGNOSIS — J45909 Unspecified asthma, uncomplicated: Secondary | ICD-10-CM | POA: Diagnosis present

## 2020-08-23 DIAGNOSIS — E872 Acidosis: Secondary | ICD-10-CM | POA: Diagnosis present

## 2020-08-23 DIAGNOSIS — D631 Anemia in chronic kidney disease: Secondary | ICD-10-CM | POA: Diagnosis present

## 2020-08-23 DIAGNOSIS — Z6841 Body Mass Index (BMI) 40.0 and over, adult: Secondary | ICD-10-CM

## 2020-08-23 DIAGNOSIS — E875 Hyperkalemia: Secondary | ICD-10-CM | POA: Diagnosis present

## 2020-08-23 DIAGNOSIS — Z79899 Other long term (current) drug therapy: Secondary | ICD-10-CM

## 2020-08-23 DIAGNOSIS — N183 Chronic kidney disease, stage 3 unspecified: Secondary | ICD-10-CM | POA: Diagnosis present

## 2020-08-23 DIAGNOSIS — E1122 Type 2 diabetes mellitus with diabetic chronic kidney disease: Secondary | ICD-10-CM

## 2020-08-23 DIAGNOSIS — Z8249 Family history of ischemic heart disease and other diseases of the circulatory system: Secondary | ICD-10-CM

## 2020-08-23 DIAGNOSIS — Z888 Allergy status to other drugs, medicaments and biological substances status: Secondary | ICD-10-CM

## 2020-08-23 DIAGNOSIS — N179 Acute kidney failure, unspecified: Principal | ICD-10-CM | POA: Diagnosis present

## 2020-08-23 DIAGNOSIS — Z87440 Personal history of urinary (tract) infections: Secondary | ICD-10-CM

## 2020-08-23 HISTORY — DX: Disorder of kidney and ureter, unspecified: N28.9

## 2020-08-23 LAB — CBC
HCT: 25 % — ABNORMAL LOW (ref 36.0–46.0)
Hemoglobin: 8.3 g/dL — ABNORMAL LOW (ref 12.0–15.0)
MCH: 28.4 pg (ref 26.0–34.0)
MCHC: 33.2 g/dL (ref 30.0–36.0)
MCV: 85.6 fL (ref 80.0–100.0)
Platelets: 257 10*3/uL (ref 150–400)
RBC: 2.92 MIL/uL — ABNORMAL LOW (ref 3.87–5.11)
RDW: 13.6 % (ref 11.5–15.5)
WBC: 9.7 10*3/uL (ref 4.0–10.5)
nRBC: 0 % (ref 0.0–0.2)

## 2020-08-23 LAB — BASIC METABOLIC PANEL
Anion gap: 8 (ref 5–15)
BUN: 54 mg/dL — ABNORMAL HIGH (ref 6–20)
CO2: 20 mmol/L — ABNORMAL LOW (ref 22–32)
Calcium: 6.1 mg/dL — CL (ref 8.9–10.3)
Chloride: 108 mmol/L (ref 98–111)
Creatinine, Ser: 8.52 mg/dL — ABNORMAL HIGH (ref 0.44–1.00)
GFR, Estimated: 5 mL/min — ABNORMAL LOW (ref >60)
Glucose, Bld: 152 mg/dL — ABNORMAL HIGH (ref 70–99)
Potassium: 5.4 mmol/L — ABNORMAL HIGH (ref 3.5–5.1)
Sodium: 136 mmol/L (ref 135–145)

## 2020-08-23 LAB — HEPATIC FUNCTION PANEL
ALT: 10 U/L (ref 0–44)
AST: 14 U/L — ABNORMAL LOW (ref 15–41)
Albumin: 2.7 g/dL — ABNORMAL LOW (ref 3.5–5.0)
Alkaline Phosphatase: 100 U/L (ref 38–126)
Bilirubin, Direct: <0.1 mg/dL (ref 0.0–0.2)
Total Bilirubin: 0.3 mg/dL (ref 0.3–1.2)
Total Protein: 6 g/dL — ABNORMAL LOW (ref 6.5–8.1)

## 2020-08-23 LAB — GLUCOSE, CAPILLARY: Glucose-Capillary: 121 mg/dL — ABNORMAL HIGH (ref 70–99)

## 2020-08-23 LAB — MAGNESIUM: Magnesium: 1.8 mg/dL (ref 1.7–2.4)

## 2020-08-23 LAB — TROPONIN I (HIGH SENSITIVITY)
Troponin I (High Sensitivity): 7 ng/L (ref <18)
Troponin I (High Sensitivity): 8 ng/L (ref <18)

## 2020-08-23 MED ORDER — INSULIN DETEMIR 100 UNIT/ML ~~LOC~~ SOLN
7.0000 [IU] | Freq: Every day | SUBCUTANEOUS | Status: DC
  Filled 2020-08-23: qty 0.07

## 2020-08-23 MED ORDER — CALCIUM GLUCONATE-NACL 1-0.675 GM/50ML-% IV SOLN
1.0000 g | Freq: Once | INTRAVENOUS | Status: AC
  Administered 2020-08-23: 1000 mg via INTRAVENOUS
  Filled 2020-08-23: qty 50

## 2020-08-23 MED ORDER — INSULIN ASPART 100 UNIT/ML ~~LOC~~ SOLN: 0.0000 [IU] | Freq: Every day | SUBCUTANEOUS | Status: DC

## 2020-08-23 MED ORDER — ONDANSETRON HCL 4 MG/2ML IJ SOLN
4.0000 mg | Freq: Four times a day (QID) | INTRAMUSCULAR | Status: DC | PRN
  Administered 2020-08-25 – 2020-08-27 (×2): 4 mg via INTRAVENOUS
  Filled 2020-08-23 (×3): qty 2

## 2020-08-23 MED ORDER — ONDANSETRON HCL 4 MG PO TABS
4.0000 mg | ORAL_TABLET | Freq: Four times a day (QID) | ORAL | Status: DC | PRN
  Administered 2020-09-02: 4 mg via ORAL
  Filled 2020-08-23: qty 1

## 2020-08-23 MED ORDER — ACETAMINOPHEN 325 MG PO TABS
650.0000 mg | ORAL_TABLET | Freq: Four times a day (QID) | ORAL | Status: AC | PRN
  Administered 2020-08-24 – 2020-08-26 (×3): 650 mg via ORAL
  Filled 2020-08-23 (×3): qty 2

## 2020-08-23 MED ORDER — AMLODIPINE BESYLATE 10 MG PO TABS
10.0000 mg | ORAL_TABLET | Freq: Every day | ORAL | Status: DC
  Administered 2020-08-24 – 2020-09-02 (×10): 10 mg via ORAL
  Filled 2020-08-23 (×10): qty 1

## 2020-08-23 MED ORDER — ACETAMINOPHEN 650 MG RE SUPP: 650.0000 mg | Freq: Four times a day (QID) | RECTAL | Status: AC | PRN

## 2020-08-23 MED ORDER — SERTRALINE HCL 50 MG PO TABS
50.0000 mg | ORAL_TABLET | Freq: Every day | ORAL | Status: DC
  Administered 2020-08-24 – 2020-09-02 (×10): 50 mg via ORAL
  Filled 2020-08-23 (×10): qty 1

## 2020-08-23 MED ORDER — INSULIN ASPART 100 UNIT/ML ~~LOC~~ SOLN
0.0000 [IU] | Freq: Three times a day (TID) | SUBCUTANEOUS | Status: DC
  Administered 2020-08-25 – 2020-08-31 (×9): 1 [IU] via SUBCUTANEOUS
  Administered 2020-09-01: 2 [IU] via SUBCUTANEOUS
  Administered 2020-09-02: 1 [IU] via SUBCUTANEOUS
  Filled 2020-08-23 (×10): qty 1

## 2020-08-23 MED ORDER — HEPARIN SODIUM (PORCINE) 5000 UNIT/ML IJ SOLN
5000.0000 [IU] | Freq: Three times a day (TID) | INTRAMUSCULAR | Status: DC
  Administered 2020-08-23 – 2020-09-02 (×29): 5000 [IU] via SUBCUTANEOUS
  Filled 2020-08-23 (×29): qty 1

## 2020-08-23 MED ORDER — SODIUM POLYSTYRENE SULFONATE 15 GM/60ML PO SUSP
30.0000 g | Freq: Once | ORAL | Status: AC
  Administered 2020-08-23: 30 g via ORAL
  Filled 2020-08-23: qty 120

## 2020-08-23 MED ORDER — SODIUM CHLORIDE 0.9 % IV BOLUS
1000.0000 mL | Freq: Once | INTRAVENOUS | Status: AC
  Administered 2020-08-23: 1000 mL via INTRAVENOUS

## 2020-08-23 MED ORDER — CALCIUM CARBONATE ANTACID 500 MG PO CHEW
1.0000 | CHEWABLE_TABLET | Freq: Three times a day (TID) | ORAL | Status: DC
  Administered 2020-08-24 – 2020-09-02 (×23): 200 mg via ORAL
  Filled 2020-08-23 (×24): qty 1

## 2020-08-23 MED ORDER — IPRATROPIUM-ALBUTEROL 0.5-2.5 (3) MG/3ML IN SOLN: 3.0000 mL | RESPIRATORY_TRACT | Status: DC | PRN

## 2020-08-23 MED ORDER — METOPROLOL SUCCINATE ER 25 MG PO TB24
25.0000 mg | ORAL_TABLET | Freq: Every day | ORAL | Status: DC
  Administered 2020-08-24 – 2020-08-25 (×2): 25 mg via ORAL
  Filled 2020-08-23 (×3): qty 1

## 2020-08-23 MED ORDER — LABETALOL HCL 5 MG/ML IV SOLN
10.0000 mg | INTRAVENOUS | Status: AC | PRN
  Administered 2020-08-25 – 2020-08-27 (×3): 10 mg via INTRAVENOUS
  Filled 2020-08-23 (×4): qty 4

## 2020-08-23 NOTE — ED Provider Notes (Signed)
[facility] Emergency Department Provider Note  ____________________________________________  Time seen: Approximately 7:38 PM  I have reviewed the triage vital signs and the nursing notes.   HISTORY  Chief Complaint Chest Pain    HPI Early Shrake is a 45 y.o. female who presents the emergency department for generalized malaise, weakness, shortness of breath.  Patient states that she has had several days of increasing symptoms.  Patient states that with her shortness of breath she was using her inhaler which did not make any improvement in her symptoms.  She does have a history of asthma but has not had any wheezing.  No chest pressure or pain.  Patient states that she has a history of CHF, has had no peripheral edema.  She does have a history of diabetes and chronic kidney disease as well.  Patient denies any increased thirst or urination.  No dysuria, polyuria, hematuria.  No abdominal pain.  No nausea vomiting, diarrhea or constipation.  Patient denies any fevers or chills         Past Medical History:  Diagnosis Date  . Asthma   . Diabetes ([place])   . Hypertension   . Renal disorder     Patient Active Problem List   Diagnosis Date Noted  . ESRD (end stage renal disease) ([place]) 08/23/2020  . AKI (acute kidney injury) ([place]) 12/09/2019  . Obesity, Class III, BMI 40-49.9 (morbid obesity) ([place]) 12/09/2019  . Diabetes ([place])   . Hypertensive urgency   . Chest pain     Past Surgical History:  Procedure Laterality Date  . CESAREAN SECTION N/A   . CHOLECYSTECTOMY      Prior to Admission medications   Medication Sig Start Date End Date Taking? Authorizing Provider  amLODipine (NORVASC) 10 MG tablet Take 1 tablet (10 mg total) by mouth daily. 12/14/19  Yes Wyvonnia Dusky, MD  calcium carbonate (TUMS - DOSED IN MG ELEMENTAL CALCIUM) 500 MG chewable tablet Chew 1 tablet by mouth 3 (three) times daily with meals.   Yes [provider]   cholecalciferol (VITAMIN D) 25 MCG (1000 UNIT) tablet Take 2,000 Units by mouth daily.   Yes [provider]  FEROSUL 325 (65 Fe) MG tablet Take 325 mg by mouth daily.   Yes [provider]  furosemide (LASIX) 40 MG tablet Take 80 mg by mouth 2 (two) times daily.   Yes [provider]  Insulin Syringe-Needle U-100 29G X 1" 0.3 ML MISC 8 units of novolin TID w/ meals. 12/13/19  Yes Wyvonnia Dusky, MD  LEVEMIR FLEXTOUCH 100 UNIT/ML FlexPen Inject 7 Units into the skin at bedtime. 12/13/19  Yes Wyvonnia Dusky, MD  metoprolol succinate (TOPROL-XL) 25 MG 24 hr tablet Take 25 mg by mouth daily.   Yes [provider]  NOVOLIN R 100 UNIT/ML injection Inject 0.08 mLs (8 Units total) into the skin with breakfast, with lunch, and with evening meal. 12/13/19  Yes Wyvonnia Dusky, MD  sertraline (ZOLOFT) 50 MG tablet Take 50 mg by mouth daily.   Yes [provider]  sodium bicarbonate 650 MG tablet Take 3 tablets by mouth 3 (three) times daily.   Yes [provider]    Allergies Cholesterol  Family History  Problem Relation Age of Onset  . Heart attack Father        died at ~78 years of age    Social History Social History   Tobacco Use  . Smoking status: Never Smoker  .  Smokeless tobacco: Never Used  Vaping Use  . Vaping Use: Never used  Substance Use Topics  . Alcohol use: Never     Review of Systems  Constitutional: No fever/chills.  Positive for weakness and generalized malaise Eyes: No visual changes. No discharge ENT: No upper respiratory complaints. Cardiovascular: no chest pain. Respiratory: no cough.  Positive SOB. Gastrointestinal: No abdominal pain.  No nausea, no vomiting.  No diarrhea.  No constipation. Genitourinary: Negative for dysuria. No hematuria Musculoskeletal: Negative for musculoskeletal pain. Skin: Negative for rash, abrasions, lacerations, ecchymosis. Neurological: Negative for headaches, focal  weakness or numbness.  10 System ROS otherwise negative.  ____________________________________________   PHYSICAL EXAM:  VITAL SIGNS: ED Triage Vitals  Enc Vitals Group     BP 08/23/20 1754 (!) 183/78     Pulse Rate 08/23/20 1754 80     Resp 08/23/20 1754 (!) 22     Temp 08/23/20 1754 98.1 F (36.7 C)     Temp Source 08/23/20 1754 Oral     SpO2 08/23/20 1754 97 %     Weight 08/23/20 1755 282 lb (127.9 kg)     Height 08/23/20 1755 '5\' 1"'$  (1.549 m)     Head Circumference --      Peak Flow --      Pain Score 08/23/20 1755 6     Pain Loc --      Pain Edu? --      Excl. in [place]? --      Constitutional: Alert and oriented.  Mildly ill appearing but in no acute distress. Eyes: Conjunctivae are normal. PERRL. EOMI. Head: Atraumatic. ENT:      Ears:       Nose: No congestion/rhinnorhea.      Mouth/Throat: Mucous membranes are moist.  Neck: No stridor.  Neck is supple full range of motion Hematological/Lymphatic/Immunilogical: No cervical lymphadenopathy. Cardiovascular: Normal rate, regular rhythm. Normal S1 and S2.  Good peripheral circulation. Respiratory: Normal respiratory effort without tachypnea or retractions. Lungs CTAB. Good air entry to the bases with no decreased or absent breath sounds. Gastrointestinal: Bowel sounds 4 quadrants. Soft and nontender to palpation. No guarding or rigidity. No palpable masses. No distention. No CVA tenderness. Musculoskeletal: Full range of motion to all extremities. No gross deformities appreciated. Neurologic:  Normal speech and language. No gross focal neurologic deficits are appreciated.  Skin:  Skin is warm, dry and intact. No rash noted. Psychiatric: Mood and affect are normal. Speech and behavior are normal. Patient exhibits appropriate insight and judgement.   ____________________________________________   LABS (all labs ordered are listed, but only abnormal results are displayed)  Labs Reviewed  BASIC METABOLIC PANEL -  Abnormal; Notable for the following components:      Result Value   Potassium 5.4 (*)    CO2 20 (*)    Glucose, Bld 152 (*)    BUN 54 (*)    Creatinine, Ser 8.52 (*)    Calcium 6.1 (*)    GFR, Estimated 5 (*)    All other components within normal limits  CBC - Abnormal; Notable for the following components:   RBC 2.92 (*)    Hemoglobin 8.3 (*)    HCT 25.0 (*)    All other components within normal limits  HEPATIC FUNCTION PANEL - Abnormal; Notable for the following components:   Total Protein 6.0 (*)    Albumin 2.7 (*)    AST 14 (*)    All other components within normal limits  GLUCOSE, CAPILLARY -  Abnormal; Notable for the following components:   Glucose-Capillary 121 (*)    All other components within normal limits  SARS CORONAVIRUS 2 (TAT 6-24 HRS)  MAGNESIUM  CALCIUM, IONIZED  BASIC METABOLIC PANEL  CBC  HEMOGLOBIN A1C  TROPONIN I (HIGH SENSITIVITY)  TROPONIN I (HIGH SENSITIVITY)   ____________________________________________  EKG   ____________________________________________  RADIOLOGY I personally viewed and evaluated these images as part of my medical decision making, as well as reviewing the written report by the radiologist.  ED Provider Interpretation: No acute findings on chest x-ray  DG Chest 2 View  Result Date: 08/23/2020 CLINICAL DATA:  Chest pain for the past 2 days with shortness of breath. History of asthma. EXAM: CHEST - 2 VIEW COMPARISON:  12/08/2019 FINDINGS: Borderline enlarged cardiac silhouette with a minimal increase in size. Tortuous aorta. Stable linear scarring at the left lung base. Otherwise, clear lungs. Thoracic spine degenerative changes. IMPRESSION: No acute abnormality. Electronically Signed   By: Claudie Revering M.D.   On: 08/23/2020 18:51    ____________________________________________    PROCEDURES  Procedure(s) performed:    Procedures    Medications  amLODipine (NORVASC) tablet 10 mg (has no administration in time  range)  metoprolol succinate (TOPROL-XL) 24 hr tablet 25 mg (has no administration in time range)  calcium carbonate (TUMS - dosed in mg elemental calcium) chewable tablet 200 mg of elemental calcium (has no administration in time range)  acetaminophen (TYLENOL) tablet 650 mg (has no administration in time range)    Or  acetaminophen (TYLENOL) suppository 650 mg (has no administration in time range)  ondansetron (ZOFRAN) tablet 4 mg (has no administration in time range)    Or  ondansetron (ZOFRAN) injection 4 mg (has no administration in time range)  heparin injection 5,000 Units (has no administration in time range)  insulin aspart (novoLOG) injection 0-5 Units (has no administration in time range)  insulin aspart (novoLOG) injection 0-6 Units (has no administration in time range)  labetalol (NORMODYNE) injection 10 mg (has no administration in time range)  sodium chloride 0.9 % bolus 1,000 mL (1,000 mLs Intravenous New Bag/Given 08/23/20 2010)  calcium gluconate 1 g/ 50 mL sodium chloride IVPB (1,000 mg Intravenous New Bag/Given 08/23/20 2013)  sodium polystyrene (KAYEXALATE) 15 GM/60ML suspension 30 g (30 g Oral Given 08/23/20 2013)     ____________________________________________   INITIAL IMPRESSION / ASSESSMENT AND PLAN / ED COURSE  Pertinent labs & imaging results that were available during my care of the patient were reviewed by me and considered in my medical decision making (see chart for details).  Review of the [place] CSRS was performed in accordance of the [place] prior to dispensing any controlled drugs.           Patient's diagnosis is consistent with acute kidney injury, hypercalcemia.  Patient presents emergency department complaining of generalized malaise, shortness of breath.  Patient with reassuring exam.  No adventitious lung sounds on auscultation.  No increased work of breathing.  Patient has had no fevers or chills, nasal congestion, sore throat.  No abdominal pain,  nausea vomiting, diarrhea or constipation.  Patient with a history of CHF but no increased peripheral edema from baseline.  Initial labs revealed a creatinine of 8.5.  She does have a history of chronic kidney disease.  Potassium 5.4, calcium 6.1.  Calcium will be replenished at this time.  Fluids started.  Nephrology recommends Kayexalate for the potassium and they will trend the patient's creatinine.  If this does not  significantly improve they will consider dialysis but dialysis not warranted emergently at this time..  At this time will discuss admission with hospitalist service.       ____________________________________________  FINAL CLINICAL IMPRESSION(S) / ED DIAGNOSES  Final diagnoses:  AKI (acute kidney injury) ([place])  Hypocalcemia      NEW MEDICATIONS STARTED DURING THIS VISIT:  ED Discharge Orders    None          This chart was dictated using voice recognition software/Dragon. Despite best efforts to proofread, errors can occur which can change the meaning. Any change was purely unintentional.    Brynda Peon 08/23/20 2133    Nance Pear, MD 08/23/20 2217

## 2020-08-23 NOTE — ED Notes (Signed)
Patient returned from CXR. Patient in stretcher in room. Patient is [facility] with rest and walking.  Patient on heart monitor with cycling BP.

## 2020-08-23 NOTE — ED Notes (Signed)
Critical calcium received: 6.1. J Cuthriell, PA informed.

## 2020-08-23 NOTE — Progress Notes (Signed)
[facility]  PROGRESS NOTE   Subjective:   Chart reviewed from [facility].  Full consult to follow in a.m. Spoke to the emergency room PA.  Labs reviewed.  This is a 45 year old female with history of diabetic kidney disease hypertension and has advanced chronic kidney disease. Patient was recently seen by [facility] nephrology and was advised that she is close to end-stage renal disease and needs dialysis intervention.  She was also referred to vascular surgery.  Objective:  Vital signs in last 24 hours:  Temp:  [98.1 F (36.7 C)-98.2 F (36.8 C)] 98.2 F (36.8 C) (04/10 2030) Pulse Rate:  [73-80] 73 (04/10 2030) Resp:  [17-22] 18 (04/10 2030) BP: (172-183)/(78-99) 172/84 (04/10 2030) SpO2:  [96 %-100 %] 96 % (04/10 2030) Weight:  [127.9 kg] 127.9 kg (04/10 1755)  Weight change:  Filed Weights   08/23/20 1755  Weight: 127.9 kg    Intake/Output: No intake/output data recorded.    Basic Metabolic Panel: Recent Labs  Lab 08/23/20 1759 08/23/20 1956  NA 136  --   K 5.4*  --   CL 108  --   CO2 20*  --   GLUCOSE 152*  --   BUN 54*  --   CREATININE 8.52*  --   CALCIUM 6.1*  --   MG  --  1.8    Liver Function Tests: Recent Labs  Lab 08/23/20 1956  AST 14*  ALT 10  ALKPHOS 100  BILITOT 0.3  PROT 6.0*  ALBUMIN 2.7*   No results for input(s): LIPASE, AMYLASE in the last 168 hours. No results for input(s): AMMONIA in the last 168 hours.  CBC: Recent Labs  Lab 08/23/20 1759  WBC 9.7  HGB 8.3*  HCT 25.0*  MCV 85.6  PLT 257    Cardiac Enzymes: No results for input(s): CKTOTAL, CKMB, CKMBINDEX, TROPONINI in the last 168 hours.  BNP: Invalid input(s): POCBNP  CBG: No results for input(s): GLUCAP in the last 168 hours.  Microbiology: Results for orders placed or performed during the hospital encounter of 12/09/19  SARS Coronavirus 2 by RT PCR (hospital order, performed in [facility] hospital lab) Nasopharyngeal  Nasopharyngeal Swab     Status: None   Collection Time: 12/09/19  7:54 AM   Specimen: Nasopharyngeal Swab  Result Value Ref Range Status   SARS Coronavirus 2 NEGATIVE NEGATIVE Final    Comment: (NOTE) SARS-CoV-2 target nucleic acids are NOT DETECTED.  The SARS-CoV-2 RNA is generally detectable in upper and lower respiratory specimens during the acute phase of infection. The lowest concentration of SARS-CoV-2 viral copies this assay can detect is 250 copies / mL. A negative result does not preclude SARS-CoV-2 infection and should not be used as the sole basis for treatment or other patient management decisions.  A negative result may occur with improper specimen collection / handling, submission of specimen other than nasopharyngeal swab, presence of viral mutation(s) within the areas targeted by this assay, and inadequate number of viral copies (<250 copies / mL). A negative result must be combined with clinical observations, patient history, and epidemiological information.  Fact Sheet for Patients:   StrictlyIdeas.no  Fact Sheet for Healthcare Providers: BankingDealers.co.za  This test is not yet approved or  cleared by the [place] FDA and has been authorized for detection and/or diagnosis of SARS-CoV-2 by FDA under an Emergency Use Authorization (EUA).  This EUA will remain in effect (meaning this test can be used) for the duration of the  COVID-19 declaration under Section 564(b)(1) of the Act, 21 U.S.C. section 360bbb-3(b)(1), unless the authorization is terminated or revoked sooner.  Performed at [facility], [address] [place], [place] [address]     Coagulation Studies: No results for input(s): LABPROT, INR in the last 72 hours.  Urinalysis: No results for input(s): COLORURINE, LABSPEC, PHURINE, GLUCOSEU, HGBUR, BILIRUBINUR, KETONESUR, PROTEINUR, UROBILINOGEN, NITRITE, LEUKOCYTESUR in the last 72  hours.  Invalid input(s): APPERANCEUR    Imaging: DG Chest 2 View  Result Date: 08/23/2020 CLINICAL DATA:  Chest pain for the past 2 days with shortness of breath. History of asthma. EXAM: CHEST - 2 VIEW COMPARISON:  12/08/2019 FINDINGS: Borderline enlarged cardiac silhouette with a minimal increase in size. Tortuous aorta. Stable linear scarring at the left lung base. Otherwise, clear lungs. Thoracic spine degenerative changes. IMPRESSION: No acute abnormality. Electronically Signed   By: Claudie Revering M.D.   On: 08/23/2020 18:51     Medications:   . calcium gluconate 1,000 mg (08/23/20 2013)   . [START ON 08/24/2020] amLODipine  10 mg Oral Daily  . [START ON 08/24/2020] calcium carbonate  1 tablet Oral TID WC  . heparin  5,000 Units Subcutaneous Q8H  . insulin aspart  0-5 Units Subcutaneous QHS  . [START ON 08/24/2020] insulin aspart  0-6 Units Subcutaneous TID WC  . insulin detemir  7 Units Subcutaneous QHS  . [START ON 08/24/2020] metoprolol succinate  25 mg Oral Daily    Assessment/ Plan:     Principal Problem:   AKI (acute kidney injury) ([place]) Active Problems:   Obesity, Class III, BMI 40-49.9 (morbid obesity) ([place])   Diabetes (HCC)   Hypertensive urgency   ESRD (end stage renal disease) ([place])   45 year old female with history of acute kidney injury on the top of chronic kidney disease. She also has history of diabetes and hypertension.  Patient has CKD stage stage V advanced towards ESRD with hyperkalemia and also hypocalcemia. I agree with supplementing calcium and also 30 g of Kayexalate for hyperkalemia. Will reevaluate the patient in the morning and will speak to the patient and the family and make arrangements for possible initiation of dialysis.    LOS: 0 Lyla Son, MD [facility] 4/10/20228:56 PM

## 2020-08-23 NOTE — H&P (Addendum)
History and Physical   [name] [id number] DOB: [birth year] DOA: 08/23/2020  PCP: Patient, No Pcp Per (Inactive)  Outpatient Specialists: Dr. Bo Merino, nephrology Patient coming from: home   I have personally briefly reviewed patient's old medical records in [place].  Chief Concern: shortness of breath   HPI: [name] is a 45 y.o. female with medical history significant for hypertension, previously insulin-dependent diabetes mellitus, CKD 4, morbid obesity, metabolic acidosis secondary to poor renal function, history of recurrent urinary tract infection, presents to the emergency department for chief concerns of shortness of breath.  She developed shortness of breath over the last 4 days.  She endorsed that she is compliant with her medication except for her insulin.  She states that she does take her new dose of furosemide 80 mg twice daily.  She also endorses chest tenderness.  She reports that she drinks approximately 4-5 bottles of 16.9 ounce water per day.  She reports that the shortness of breath is present at rest and worse with ambulation.  She denies fever, vomiting, or diarrhea.  She states she still makes urine.  She does endorse that in the last few days since she has had her increased dose of fluid medications she is urinating more than normal.  She reports to the ED provider that she has stopped taking her insulin because she believed that the insulin contributed to her renal deterioration.  EDP specifically asked patient if she sees a kidney doctor and she said no.  She states that the only medication she takes is a blood pressure medicine and that she does not have any previous kidney problems.  Social history: She lives at home with her 2 daughters and son.  She denies tobacco, EtOH, recreational drug use  Vaccination: Patient is not vaccinated for COVID-19, she does not believe this will help her  ROS: Constitutional: no weight change, no  fever ENT/Mouth: no sore throat, no rhinorrhea Eyes: no eye pain, no vision changes Cardiovascular: no chest pain, + dyspnea,  no edema, no palpitations Respiratory: no cough, no sputum, no wheezing Gastrointestinal: + nausea, no vomiting, no diarrhea, no constipation Genitourinary: no urinary incontinence, no dysuria, no hematuria Musculoskeletal: no arthralgias, no myalgias Skin: no skin lesions, no pruritus, Neuro: + weakness, no loss of consciousness, no syncope Psych: no anxiety, no depression, + decrease appetite Heme/Lymph: no bruising, no bleeding  ED Course: Discussed with ED provider, patient requiring hospitalization due to acute kidney injury.  Vitals in the emergency department was remarkable for temperature of 98.1, respiration rate of 22, heart rate of 73, blood pressure 178/97, SPO2 of 99% on room air.  Labs in the emergency department was remarkable for sodium level of 136, potassium 5.4, chloride 108, bicarb 20, BUN 54, serum creatinine of 8.52, nonfasting blood glucose 154, WBC 9.7, hemoglobin 8.3, platelets 257.  Serum calcium level of 6.1  Troponin was initially 7 and the second value was 8.  Covid test is pending  Assessment/Plan  Principal Problem:   AKI (acute kidney injury) ([place]) Active Problems:   Obesity, Class III, BMI 40-49.9 (morbid obesity) ([place])   Diabetes ([place])   Hypertensive urgency   ESRD (end stage renal disease) ([place])   AKI on CKD4 -suspect secondary to recent increase in furosemide -Nephrology, Dr. Joylene John has been consulted and recommends calcium gluconate, fluid replacement, Kayexalate -Status post normal saline 1 L bolus per ED provider -BMP in the a.m.  Hypocalcemia-suspect secondary to CKD 4 and now likely end-stage renal disease -  Ionized calcium ordered -Corrected calcium level is 7.1 -Status post 1 dose of calcium gluconate per ED provider -Patient was told to take Tums 3 times a day, she states that she has not been doing  this -I have resumed her Tums medication  Anemia of chronic disease-decreased from baseline of 10.6-11.5 -No evidence of bleeding at this time, she denies black stool, diarrhea, bright red blood per rectum -She states she has stopped taking NSAIDs since her nephrologist told her many months ago  Hyperkalemia-secondary to acute kidney injury, treat as above  Hypertensive urgency-query medication noncompliance  History of IDDM-asked her glucose is 154 and nonfasting, this is likely because she is now end-stage renal disease -Holding home insulin -Insulin sliding scale with at bedtime coverage, for end-stage renal disease  Obesity-follow-up with PCP for diet and exercise guided weight loss  Metabolic syndrome  Covid test pending  Chart reviewed.   DVT prophylaxis: Heparin 5000 units every 8 hours subcutaneous Code Status: Full code Diet: Renal with fluid restriction Family Communication: No, per patient request Disposition Plan: Pending clinical course Consults called: Nephrology Admission status: Observation, progressive cardiac, with telemetry  Past Medical History:  Diagnosis Date  . Asthma   . Diabetes ([place])   . Hypertension   . Renal disorder    Past Surgical History:  Procedure Laterality Date  . CESAREAN SECTION N/A   . CHOLECYSTECTOMY     Social History:  reports that she has never smoked. She has never used smokeless tobacco. She reports that she does not drink alcohol. No history on file for drug use.  Allergies  Allergen Reactions  . Cholesterol Rash    Cholesterol medicine   Family History  Problem Relation Age of Onset  . Heart attack Father        died at ~4 years of age   Family history: Family history reviewed and not pertinent  Prior to Admission medications   Medication Sig Start Date End Date Taking? Authorizing Provider  amLODipine (NORVASC) 10 MG tablet Take 1 tablet (10 mg total) by mouth daily. 12/14/19  Yes Wyvonnia Dusky, MD   calcium carbonate (TUMS - DOSED IN MG ELEMENTAL CALCIUM) 500 MG chewable tablet Chew 1 tablet by mouth 3 (three) times daily with meals.   Yes [provider]  cholecalciferol (VITAMIN D) 25 MCG (1000 UNIT) tablet Take 2,000 Units by mouth daily.   Yes [provider]  FEROSUL 325 (65 Fe) MG tablet Take 325 mg by mouth daily.   Yes [provider]  furosemide (LASIX) 40 MG tablet Take 80 mg by mouth 2 (two) times daily.   Yes [provider]  Insulin Syringe-Needle U-100 29G X 1" 0.3 ML MISC 8 units of novolin TID w/ meals. 12/13/19  Yes Wyvonnia Dusky, MD  LEVEMIR FLEXTOUCH 100 UNIT/ML FlexPen Inject 7 Units into the skin at bedtime. 12/13/19  Yes Wyvonnia Dusky, MD  metoprolol succinate (TOPROL-XL) 25 MG 24 hr tablet Take 25 mg by mouth daily.   Yes [provider]  NOVOLIN R 100 UNIT/ML injection Inject 0.08 mLs (8 Units total) into the skin with breakfast, with lunch, and with evening meal. 12/13/19  Yes Wyvonnia Dusky, MD  sertraline (ZOLOFT) 50 MG tablet Take 50 mg by mouth daily.   Yes [provider]  sodium bicarbonate 650 MG tablet Take 3 tablets by mouth 3 (three) times daily.   Yes [provider]   Physical Exam: Vitals:   08/23/20 1754 08/23/20  1755 08/23/20 1832 08/23/20 1900  BP: (!) 183/78  (!) 177/99 (!) 178/97  Pulse: 80  75 76  Resp: (!) '22  18 17  '$ Temp: 98.1 F (36.7 C)     TempSrc: Oral     SpO2: 97%  100% 98%  Weight:  127.9 kg    Height:  '5\' 1"'$  (1.549 m)     Constitutional: appears older than chronological age, NAD, calm, comfortable Eyes: PERRL, lids and conjunctivae normal ENMT: Mucous membranes are moist. Posterior pharynx clear of any exudate or lesions. Age-appropriate dentition. Hearing appropriate Neck: normal, supple, no masses, no thyromegaly Respiratory: clear to auscultation bilaterally, no wheezing, no crackles. Normal respiratory effort. No accessory muscle use.   Cardiovascular: Regular rate and rhythm, no murmurs / rubs / gallops. No extremity edema. 2+ pedal pulses. No carotid bruits.  Chest tenderness with palpation  abdomen: Morbidly obese abdomen, no tenderness, no masses palpated, no hepatosplenomegaly. Bowel sounds positive.  Musculoskeletal: no clubbing / cyanosis. No joint deformity upper and lower extremities. Good ROM, no contractures, no atrophy. Normal muscle tone.  Skin: no rashes, lesions, ulcers. No induration.  Striae present Neurologic: Sensation intact. Strength 5/5 in all 4.  Psychiatric: Normal judgment and insight. Alert and oriented x 3. Normal mood.   EKG: independently reviewed, showing normal sinus rhythm, rate of 75, QTc 486  Chest x-ray on Admission: I personally reviewed and I agree with radiologist reading as below.  DG Chest 2 View  Result Date: 08/23/2020 CLINICAL DATA:  Chest pain for the past 2 days with shortness of breath. History of asthma. EXAM: CHEST - 2 VIEW COMPARISON:  12/08/2019 FINDINGS: Borderline enlarged cardiac silhouette with a minimal increase in size. Tortuous aorta. Stable linear scarring at the left lung base. Otherwise, clear lungs. Thoracic spine degenerative changes. IMPRESSION: No acute abnormality. Electronically Signed   By: Claudie Revering M.D.   On: 08/23/2020 18:51   Labs on Admission: I have personally reviewed following labs  CBC: Recent Labs  Lab 08/23/20 1759  WBC 9.7  HGB 8.3*  HCT 25.0*  MCV 85.6  PLT 99991111   Basic Metabolic Panel: Recent Labs  Lab 08/23/20 1759  NA 136  K 5.4*  CL 108  CO2 20*  GLUCOSE 152*  BUN 54*  CREATININE 8.52*  CALCIUM 6.1*   GFR: Estimated Creatinine Clearance: 10.5 mL/min (A) (by C-G formula based on SCr of 8.52 mg/dL (H)).  Urine analysis:    Component Value Date/Time   COLORURINE STRAW (A) 12/12/2019 2254   APPEARANCEUR CLEAR (A) 12/12/2019 2254   LABSPEC 1.008 12/12/2019 2254   PHURINE 7.0 12/12/2019 2254   GLUCOSEU >=500 (A)  12/12/2019 2254   HGBUR LARGE (A) 12/12/2019 2254   BILIRUBINUR NEGATIVE 12/12/2019 2254   KETONESUR NEGATIVE 12/12/2019 2254   PROTEINUR >=300 (A) 12/12/2019 2254   NITRITE NEGATIVE 12/12/2019 2254   LEUKOCYTESUR NEGATIVE 12/12/2019 2254   Mateusz Neilan N Ellwood Steidle D.O. [facility]  If 7PM-7AM, please contact overnight-coverage provider If 7AM-7PM, please contact day coverage provider [email or web address]  08/23/2020, 8:16 PM

## 2020-08-23 NOTE — ED Triage Notes (Signed)
Pt states that she has been having chest pain for 2 days with [facility] and pt has been using her inhaler- pt states she has also had some dizziness and feeling like she is going to pass out- pt has a hx of CHF- pt denies any swelling

## 2020-08-23 NOTE — Progress Notes (Addendum)
Pt was admitted in the floor with no signs of distress. Pt alert x 4. VSS except BP of 173/94. Pt was educated about safety and ascom within pt reach. Pt requested something for sleep and help with SOB. NP Randol Kern made aware. Will continue to monitor.  Update 2309: NP states will place order. Will continue to monitor.  Update 0614: Lab called for a critical lab calcium of 6.3. NP Randol Kern and incoming shift made aware. Will continue to monitor.  Update [id number]: Talked to NP Randol Kern but no new order was place. Will continue to monitor.

## 2020-08-24 ENCOUNTER — Other Ambulatory Visit (INDEPENDENT_AMBULATORY_CARE_PROVIDER_SITE_OTHER): Payer: Self-pay | Admitting: Vascular Surgery

## 2020-08-24 ENCOUNTER — Inpatient Hospital Stay
Admission: EM | Disposition: A | Discharge status: Home / Self Care | Payer: Self-pay | Source: Home / Self Care | Attending: Internal Medicine

## 2020-08-24 DIAGNOSIS — N183 Chronic kidney disease, stage 3 unspecified: Secondary | ICD-10-CM | POA: Diagnosis present

## 2020-08-24 DIAGNOSIS — N179 Acute kidney failure, unspecified: Principal | ICD-10-CM

## 2020-08-24 DIAGNOSIS — N185 Chronic kidney disease, stage 5: Secondary | ICD-10-CM

## 2020-08-24 HISTORY — PX: DIALYSIS/PERMA CATHETER INSERTION: CATH118288

## 2020-08-24 LAB — HEMOGLOBIN A1C
Hgb A1c MFr Bld: 5.8 % — ABNORMAL HIGH (ref 4.8–5.6)
Mean Plasma Glucose: 119.76 mg/dL

## 2020-08-24 LAB — COMPREHENSIVE METABOLIC PANEL
ALT: 9 U/L (ref 0–44)
AST: 14 U/L — ABNORMAL LOW (ref 15–41)
Albumin: 2.4 g/dL — ABNORMAL LOW (ref 3.5–5.0)
Alkaline Phosphatase: 82 U/L (ref 38–126)
Anion gap: 9 (ref 5–15)
BUN: 56 mg/dL — ABNORMAL HIGH (ref 6–20)
CO2: 18 mmol/L — ABNORMAL LOW (ref 22–32)
Calcium: 6.3 mg/dL — CL (ref 8.9–10.3)
Chloride: 111 mmol/L (ref 98–111)
Creatinine, Ser: 8.46 mg/dL — ABNORMAL HIGH (ref 0.44–1.00)
GFR, Estimated: 5 mL/min — ABNORMAL LOW (ref >60)
Glucose, Bld: 97 mg/dL (ref 70–99)
Potassium: 4.9 mmol/L (ref 3.5–5.1)
Sodium: 138 mmol/L (ref 135–145)
Total Bilirubin: 0.5 mg/dL (ref 0.3–1.2)
Total Protein: 5.5 g/dL — ABNORMAL LOW (ref 6.5–8.1)

## 2020-08-24 LAB — CBC
HCT: 25.3 % — ABNORMAL LOW (ref 36.0–46.0)
Hemoglobin: 8.3 g/dL — ABNORMAL LOW (ref 12.0–15.0)
MCH: 28 pg (ref 26.0–34.0)
MCHC: 32.8 g/dL (ref 30.0–36.0)
MCV: 85.5 fL (ref 80.0–100.0)
Platelets: 241 10*3/uL (ref 150–400)
RBC: 2.96 MIL/uL — ABNORMAL LOW (ref 3.87–5.11)
RDW: 13.7 % (ref 11.5–15.5)
WBC: 8.8 10*3/uL (ref 4.0–10.5)
nRBC: 0 % (ref 0.0–0.2)

## 2020-08-24 LAB — GLUCOSE, CAPILLARY
Glucose-Capillary: 110 mg/dL — ABNORMAL HIGH (ref 70–99)
Glucose-Capillary: 135 mg/dL — ABNORMAL HIGH (ref 70–99)
Glucose-Capillary: 147 mg/dL — ABNORMAL HIGH (ref 70–99)
Glucose-Capillary: 156 mg/dL — ABNORMAL HIGH (ref 70–99)

## 2020-08-24 LAB — SARS CORONAVIRUS 2 (TAT 6-24 HRS): SARS Coronavirus 2: NEGATIVE

## 2020-08-24 SURGERY — DIALYSIS/PERMA CATHETER INSERTION
Anesthesia: Moderate Sedation

## 2020-08-24 MED ORDER — MIDAZOLAM HCL 2 MG/ML PO SYRP: 8.0000 mg | ORAL_SOLUTION | Freq: Once | ORAL | Status: DC | PRN

## 2020-08-24 MED ORDER — FAMOTIDINE 20 MG PO TABS: 40.0000 mg | ORAL_TABLET | Freq: Once | ORAL | Status: DC | PRN

## 2020-08-24 MED ORDER — MIDAZOLAM HCL 2 MG/2ML IJ SOLN
INTRAMUSCULAR | Status: DC | PRN
  Administered 2020-08-24: 2 mg via INTRAVENOUS
  Administered 2020-08-24: 1 mg via INTRAVENOUS

## 2020-08-24 MED ORDER — METHYLPREDNISOLONE SODIUM SUCC 125 MG IJ SOLR: 125.0000 mg | Freq: Once | INTRAMUSCULAR | Status: DC | PRN

## 2020-08-24 MED ORDER — CHLORHEXIDINE GLUCONATE CLOTH 2 % EX PADS
6.0000 | MEDICATED_PAD | Freq: Every day | CUTANEOUS | Status: DC
  Administered 2020-08-25 – 2020-08-31 (×8): 6 via TOPICAL

## 2020-08-24 MED ORDER — MIDAZOLAM HCL 5 MG/5ML IJ SOLN
INTRAMUSCULAR | Status: AC
  Filled 2020-08-24: qty 5

## 2020-08-24 MED ORDER — ONDANSETRON HCL 4 MG/2ML IJ SOLN: 4.0000 mg | Freq: Four times a day (QID) | INTRAMUSCULAR | Status: DC | PRN

## 2020-08-24 MED ORDER — FENTANYL CITRATE (PF) 100 MCG/2ML IJ SOLN
INTRAMUSCULAR | Status: DC | PRN
  Administered 2020-08-24 (×2): 25 ug via INTRAVENOUS

## 2020-08-24 MED ORDER — HEPARIN SODIUM (PORCINE) 10000 UNIT/ML IJ SOLN
INTRAMUSCULAR | Status: AC
  Filled 2020-08-24: qty 1

## 2020-08-24 MED ORDER — FENTANYL CITRATE (PF) 100 MCG/2ML IJ SOLN
INTRAMUSCULAR | Status: AC
  Filled 2020-08-24: qty 2

## 2020-08-24 MED ORDER — HYDROMORPHONE HCL 1 MG/ML IJ SOLN
1.0000 mg | Freq: Once | INTRAMUSCULAR | Status: AC | PRN
Start: 2020-08-24 — End: 2020-08-24
  Administered 2020-08-24: 1 mg via INTRAVENOUS
  Filled 2020-08-24: qty 1

## 2020-08-24 MED ORDER — CEFAZOLIN SODIUM-DEXTROSE 1-4 GM/50ML-% IV SOLN
INTRAVENOUS | Status: AC
  Filled 2020-08-24: qty 50

## 2020-08-24 MED ORDER — DIPHENHYDRAMINE HCL 50 MG/ML IJ SOLN: 50.0000 mg | Freq: Once | INTRAMUSCULAR | Status: DC | PRN

## 2020-08-24 MED ORDER — CEFAZOLIN SODIUM-DEXTROSE 2-4 GM/100ML-% IV SOLN: 2.0000 g | Freq: Once | INTRAVENOUS | Status: DC

## 2020-08-24 MED ORDER — SODIUM CHLORIDE 0.9 % IV SOLN: INTRAVENOUS | Status: DC

## 2020-08-24 SURGICAL SUPPLY — 7 items
CATH CANNON HEMO 15FR 23CM (HEMODIALYSIS SUPPLIES) ×2 IMPLANT
COVER PROBE U/S 5X48 (MISCELLANEOUS) ×2 IMPLANT
DERMABOND ADVANCED (GAUZE/BANDAGES/DRESSINGS) ×1
DERMABOND ADVANCED .7 DNX12 (GAUZE/BANDAGES/DRESSINGS) ×1 IMPLANT
PACK ANGIOGRAPHY (CUSTOM PROCEDURE TRAY) ×2 IMPLANT
SUT MNCRL AB 4-0 PS2 18 (SUTURE) ×2 IMPLANT
SUT PROLENE 0 CT 1 30 (SUTURE) ×2 IMPLANT

## 2020-08-24 NOTE — Progress Notes (Signed)
Patient arrived to the unit from special procedures, vitals taken, & tele box [id number] applied and verified with 2nd RN Danae Chen.

## 2020-08-24 NOTE — Consult Note (Signed)
[facility] CONSULT NOTE    Date: 08/24/2020                  Patient Name:  [name]  MRN: [id number]  DOB: [birth year]  Age / Sex: 45 y.o., female         PCP: Patient, No Pcp Per (Inactive)                 Service Requesting Consult:  Hospitalist                 Reason for Consult:  Chronic kidney disease stage V            History of Present Illness: Patient is a 45 y.o. female with a PMHx of diabetes mellitus type 2, hypertension, chronic kidney disease stage V, morbid obesity, metabolic acidosis, anemia of chronic kidney disease, secondary hyperparathyroidism, hyperkalemia who was admitted to [facility] on 08/23/2020 for evaluation of shortness of breath and severe renal dysfunction.  She is followed by [facility] nephrology regularly.  Currently patient is living with her daughter in [place].  Her most recent creatinine as an outpatient was 7.73 with an EGFR of 6.  While presenting here her creatinine was up to 8.46 with an EGFR of 5.  Based upon notes from [facility] nephrology it appears that the patient was likely going to require renal placement therapy in the relative near future.  She currently maintains a fair appetite and denies dysgeusia.   Medications: Outpatient medications: Medications Prior to Admission  Medication Sig Dispense Refill Last Dose  . amLODipine (NORVASC) 10 MG tablet Take 1 tablet (10 mg total) by mouth daily. 30 tablet 0 08/23/2020 at 0800  . calcium carbonate (TUMS - DOSED IN MG ELEMENTAL CALCIUM) 500 MG chewable tablet Chew 1 tablet by mouth 3 (three) times daily with meals.   08/23/2020 at 1430  . cholecalciferol (VITAMIN D) 25 MCG (1000 UNIT) tablet Take 2,000 Units by mouth daily.     . FEROSUL 325 (65 Fe) MG tablet Take 325 mg by mouth daily.     . furosemide (LASIX) 40 MG tablet Take 80 mg by mouth 2 (two) times daily.   08/23/2020 at 1430  . Insulin Syringe-Needle U-100 29G X 1" 0.3 ML MISC 8 units of novolin TID w/ meals. 1  each 0   . LEVEMIR FLEXTOUCH 100 UNIT/ML FlexPen Inject 7 Units into the skin at bedtime. 15 mL 11 08/22/2020 at 2100  . metoprolol succinate (TOPROL-XL) 25 MG 24 hr tablet Take 25 mg by mouth daily.   08/23/2020 at 0800  . NOVOLIN R 100 UNIT/ML injection Inject 0.08 mLs (8 Units total) into the skin with breakfast, with lunch, and with evening meal. 10 mL 11 As directed at As directed  . sertraline (ZOLOFT) 50 MG tablet Take 50 mg by mouth daily.   08/23/2020 at 0800  . sodium bicarbonate 650 MG tablet Take 3 tablets by mouth 3 (three) times daily.   08/23/2020 at 1430    Current medications: Current Facility-Administered Medications  Medication Dose Route Frequency Provider Last Rate Last Admin  . acetaminophen (TYLENOL) tablet 650 mg  650 mg Oral Q6H PRN Cox, Amy N, DO   650 mg at 08/24/20 0522   Or  . acetaminophen (TYLENOL) suppository 650 mg  650 mg Rectal Q6H PRN Cox, Amy N, DO      . amLODipine (NORVASC) tablet 10 mg  10 mg Oral Daily Cox, Amy N, DO  10 mg at 08/24/20 1040  . calcium carbonate (TUMS - dosed in mg elemental calcium) chewable tablet 200 mg of elemental calcium  1 tablet Oral TID WC Cox, Amy N, DO   200 mg of elemental calcium at 08/24/20 1311  . heparin injection 5,000 Units  5,000 Units Subcutaneous Q8H Cox, Amy N, DO   5,000 Units at 08/24/20 1311  . insulin aspart (novoLOG) injection 0-5 Units  0-5 Units Subcutaneous QHS Cox, Amy N, DO      . insulin aspart (novoLOG) injection 0-6 Units  0-6 Units Subcutaneous TID WC Cox, Amy N, DO      . ipratropium-albuterol (DUONEB) 0.5-2.5 (3) MG/3ML nebulizer solution 3 mL  3 mL Nebulization Q4H PRN Sharion Settler, NP      . labetalol (NORMODYNE) injection 10 mg  10 mg Intravenous Q4H PRN Cox, Amy N, DO      . metoprolol succinate (TOPROL-XL) 24 hr tablet 25 mg  25 mg Oral Daily Cox, Amy N, DO   25 mg at 08/24/20 1041  . ondansetron (ZOFRAN) tablet 4 mg  4 mg Oral Q6H PRN Cox, Amy N, DO       Or  . ondansetron (ZOFRAN) injection 4  mg  4 mg Intravenous Q6H PRN Cox, Amy N, DO      . sertraline (ZOLOFT) tablet 50 mg  50 mg Oral Daily Sharion Settler, NP   50 mg at 08/24/20 1040      Allergies: Allergies  Allergen Reactions  . Cholesterol Rash    Cholesterol medicine      Past Medical History: Past Medical History:  Diagnosis Date  . Asthma   . Diabetes ([place])   . Hypertension   . Renal disorder      Past Surgical History: Past Surgical History:  Procedure Laterality Date  . CESAREAN SECTION N/A   . CHOLECYSTECTOMY       Family History: Family History  Problem Relation Age of Onset  . Heart attack Father        died at ~36 years of age     Social History: Social History   Socioeconomic History  . Marital status: Married    Spouse name: Not on file  . Number of children: Not on file  . Years of education: Not on file  . Highest education level: Not on file  Occupational History  . Not on file  Tobacco Use  . Smoking status: Never Smoker  . Smokeless tobacco: Never Used  Vaping Use  . Vaping Use: Never used  Substance and Sexual Activity  . Alcohol use: Never  . Drug use: Not on file  . Sexual activity: Not on file  Other Topics Concern  . Not on file  Social History Narrative  . Not on file   Social Determinants of Health   Financial Resource Strain: Not on file  Food Insecurity: Not on file  Transportation Needs: Not on file  Physical Activity: Not on file  Stress: Not on file  Social Connections: Not on file  Intimate Partner Violence: Not on file     Review of Systems: As per HPI  Vital Signs: Blood pressure (!) 162/99, pulse 70, temperature 98.2 F (36.8 C), temperature source Axillary, resp. rate 16, height _0  (1.549 m), weight 131.4 kg, SpO2 100 %.  Weight trends: Filed Weights   08/23/20 1755 08/24/20 0519  Weight: 127.9 kg 131.4 kg    Physical Exam: Physical Exam: General:  No acute distress  Head:  Normocephalic, atraumatic. Moist oral mucosal  membranes  Eyes:  Anicteric  Neck:  Supple  Lungs:   Clear to auscultation, normal effort  Heart:  S1S2 no rubs  Abdomen:   Soft, nontender, bowel sounds present  Extremities:  1+ peripheral edema.  Neurologic:  Awake, alert, following commands  Skin:  No lesions  Access:  No hemodialysis access    Lab results: Basic Metabolic Panel: Recent Labs  Lab 08/23/20 1759 08/23/20 1956 08/24/20 0508  NA 136  --  138  K 5.4*  --  4.9  CL 108  --  111  CO2 20*  --  18*  GLUCOSE 152*  --  97  BUN 54*  --  56*  CREATININE 8.52*  --  8.46*  CALCIUM 6.1*  --  6.3*  MG  --  1.8  --     Liver Function Tests: Recent Labs  Lab 08/23/20 1956 08/24/20 0508  AST 14* 14*  ALT 10 9  ALKPHOS 100 82  BILITOT 0.3 0.5  PROT 6.0* 5.5*  ALBUMIN 2.7* 2.4*   No results for input(s): LIPASE, AMYLASE in the last 168 hours. No results for input(s): AMMONIA in the last 168 hours.  CBC: Recent Labs  Lab 08/23/20 1759 08/24/20 0508  WBC 9.7 8.8  HGB 8.3* 8.3*  HCT 25.0* 25.3*  MCV 85.6 85.5  PLT 257 241    Cardiac Enzymes: No results for input(s): CKTOTAL, CKMB, CKMBINDEX, TROPONINI in the last 168 hours.  BNP: Invalid input(s): POCBNP  CBG: Recent Labs  Lab 08/23/20 2119 08/24/20 0832 08/24/20 1218  GLUCAP 121* 110* 135*    Microbiology: Results for orders placed or performed during the hospital encounter of 08/23/20  SARS CORONAVIRUS 2 (TAT 6-24 HRS) Nasopharyngeal Nasopharyngeal Swab     Status: None   Collection Time: 08/23/20  7:56 PM   Specimen: Nasopharyngeal Swab  Result Value Ref Range Status   SARS Coronavirus 2 NEGATIVE NEGATIVE Final    Comment: (NOTE) SARS-CoV-2 target nucleic acids are NOT DETECTED.  The SARS-CoV-2 RNA is generally detectable in upper and lower respiratory specimens during the acute phase of infection. Negative results do not preclude SARS-CoV-2 infection, do not rule out co-infections with other pathogens, and should not be used as  the sole basis for treatment or other patient management decisions. Negative results must be combined with clinical observations, patient history, and epidemiological information. The expected result is Negative.  Fact Sheet for Patients: SugarRoll.be  Fact Sheet for Healthcare Providers: https://www.woods-mathews.com/  This test is not yet approved or cleared by the [place] FDA and  has been authorized for detection and/or diagnosis of SARS-CoV-2 by FDA under an Emergency Use Authorization (EUA). This EUA will remain  in effect (meaning this test can be used) for the duration of the COVID-19 declaration under Se ction 564(b)(1) of the Act, 21 U.S.C. section 360bbb-3(b)(1), unless the authorization is terminated or revoked sooner.  Performed at [facility], [place] [address] [place], [place] [address]     Coagulation Studies: No results for input(s): LABPROT, INR in the last 72 hours.  Urinalysis: No results for input(s): COLORURINE, LABSPEC, PHURINE, GLUCOSEU, HGBUR, BILIRUBINUR, KETONESUR, PROTEINUR, UROBILINOGEN, NITRITE, LEUKOCYTESUR in the last 72 hours.  Invalid input(s): APPERANCEUR    Imaging: DG Chest 2 View  Result Date: 08/23/2020 CLINICAL DATA:  Chest pain for the past 2 days with shortness of breath. History of asthma. EXAM: CHEST - 2 VIEW COMPARISON:  12/08/2019 FINDINGS: Borderline enlarged cardiac  silhouette with a minimal increase in size. Tortuous aorta. Stable linear scarring at the left lung base. Otherwise, clear lungs. Thoracic spine degenerative changes. IMPRESSION: No acute abnormality. Electronically Signed   By: Claudie Revering M.D.   On: 08/23/2020 18:51      Assessment & Plan: Pt is a 45 y.o. female with a PMHx of diabetes mellitus type 2, hypertension, chronic kidney disease stage V, morbid obesity, metabolic acidosis, anemia of chronic kidney disease, secondary hyperparathyroidism, hyperkalemia  who was admitted to [facility] on 08/23/2020 for evaluation of shortness of breath and severe renal dysfunction.  1.  ESRD not yet on dialysis/diabetes mellitus type 2 with chronic kidney disease..  Patient has advanced CKD secondary to diabetes mellitus type 2.  Most recent outpatient GFR was 6 and renal function further deteriorated now with an EGFR of 5.  We talked at length about renal replacement therapy.  Patient willing to move forward with renal placement therapy in the form of hemodialysis.  We will consult with vascular surgery for placement of permacath tomorrow followed by her first dialysis session thereafter.  2.  Anemia of chronic kidney disease.  Hemoglobin 8.3.  We will likely begin the patient on Epogen with dialysis treatments.  3.  Secondary hyperparathyroidism.  We will continue to monitor phosphorus and calcium.  Calcium noted to be low at 6.3.  However Albumin also low at 2.4.  4.  Hypertension.  Maintain the patient on metoprolol and amlodipine.  Consider adding ARB once she is started on dialysis.  5.  Hyperkalemia.  Serum potassium now down to 4.9.  She was administered 1 dose of Kayexalate.

## 2020-08-24 NOTE — Progress Notes (Signed)
[facility] NAME: [name]    MR#:  [id number]  DATE OF BIRTH:  1975/05/23  SUBJECTIVE:  patient appears to be a poor historian. No family at bedside. Came in with increasing shortness of breath. There is been issues with noncompliance with meds and diet at home. Overall feeling better today. Eating well.   REVIEW OF SYSTEMS:   Review of Systems  Constitutional: Negative for chills, fever and weight loss.  HENT: Negative for ear discharge, ear pain and nosebleeds.   Eyes: Negative for blurred vision, pain and discharge.  Respiratory: Positive for shortness of breath. Negative for sputum production, wheezing and stridor.   Cardiovascular: Negative for chest pain, palpitations, orthopnea and PND.  Gastrointestinal: Negative for abdominal pain, diarrhea, nausea and vomiting.  Genitourinary: Negative for frequency and urgency.  Musculoskeletal: Negative for back pain and joint pain.  Neurological: Negative for sensory change, speech change, focal weakness and weakness.  Psychiatric/Behavioral: Negative for depression and hallucinations. The patient is not nervous/anxious.    Tolerating Diet: Tolerating PT:   DRUG ALLERGIES:   Allergies  Allergen Reactions  . Cholesterol Rash    Cholesterol medicine    VITALS:  Blood pressure (!) 162/99, pulse 70, temperature 98.2 F (36.8 C), temperature source Axillary, resp. rate 16, height '5\' 1"'$  (1.549 m), weight 131.4 kg, SpO2 100 %.  PHYSICAL EXAMINATION:   Physical Exam  GENERAL:  45 y.o.-year-old patient lying in the bed with no acute distress.  MORBIDLY OBESE!! LUNGS: decreased breath sounds bilaterally, no wheezing, rales, rhonchi. No use of accessory muscles of respiration.  CARDIOVASCULAR: S1, S2 normal. No murmurs, rubs, or gallops.  ABDOMEN: Soft, nontender, nondistended. Bowel sounds present. No organomegaly or mass. abd obesity EXTREMITIES: No cyanosis, clubbing or  edema b/l.    NEUROLOGIC: nonfocal   PSYCHIATRIC:  patient is alert and oriented x 3.  SKIN: No obvious rash, lesion, or ulcer.   LABORATORY PANEL:  CBC Recent Labs  Lab 08/24/20 0508  WBC 8.8  HGB 8.3*  HCT 25.3*  PLT 241    Chemistries  Recent Labs  Lab 08/23/20 1956 08/24/20 0508  NA  --  138  K  --  4.9  CL  --  111  CO2  --  18*  GLUCOSE  --  97  BUN  --  56*  CREATININE  --  8.46*  CALCIUM  --  6.3*  MG 1.8  --   AST 14* 14*  ALT 10 9  ALKPHOS 100 82  BILITOT 0.3 0.5   Cardiac Enzymes No results for input(s): TROPONINI in the last 168 hours. RADIOLOGY:  DG Chest 2 View  Result Date: 08/23/2020 CLINICAL DATA:  Chest pain for the past 2 days with shortness of breath. History of asthma. EXAM: CHEST - 2 VIEW COMPARISON:  12/08/2019 FINDINGS: Borderline enlarged cardiac silhouette with a minimal increase in size. Tortuous aorta. Stable linear scarring at the left lung base. Otherwise, clear lungs. Thoracic spine degenerative changes. IMPRESSION: No acute abnormality. Electronically Signed   By: Claudie Revering M.D.   On: 08/23/2020 18:51   ASSESSMENT AND PLAN:  [name] a 45 y.o.femalewith medical history significant forhypertension, previously insulin-dependent diabetes mellitus, CKD 4, morbid obesity, metabolic acidosis secondary to poor renal function, history of recurrent urinary tract infection, presents to the emergency department for chief concerns of shortness of breath.  AKI on CKD4--progressed to ESRD -- multiple risk factors with diabetes, morbid obesity, hypertension --  seen by nephrology Dr. Zollie Scale. Patient will be started on hemodialysis.  --vascular consult for HD access  hypocalcemia-suspect secondary to CKD 4 and now likely end-stage renal disease -Corrected calcium level is 7.1 -Status post 1 dose of calcium gluconate per ED provider -Patient was told to take Tums 3 times a day, she states that she has not been doing this -  resumedTums  Anemia of chronic disease-decreased from baseline of 10.6-11.5 -No evidence of bleeding at this time, she denies black stool, diarrhea, bright red blood per rectum  Hyperkalemia-secondary to acute kidney injury, treat as above  Hypertensive urgency-query medication noncompliance -- continue amlodipine and metoprolol  Type II diabetes, insulin-dependent with CKD stage V  -- patient has been noncompliant with meds and diet -- continue sliding scale and Lantus  obesity-follow-up with PCP for diet and exercise guided weight loss  Metabolic syndrome  Covid test negative. DC isolation  Chart reviewed.   DVT prophylaxis: Heparin 5000 units every 8 hours subcutaneous Code Status: Full code Diet: Renal with fluid restriction Family Communication:  tried calling daughter Yulina--no response Disposition Plan: Pending clinical course Consults called: Nephrology  Level of care: Progressive Cardiac Status is: Inpatient  Remains inpatient appropriate because:Inpatient level of care appropriate due to severity of illness   Dispo: The patient is from: Home              Anticipated d/c is to: Home              Patient currently is not medically stable to d/c.   Difficult to place patient No   Patient to be started on dialysis during this hospitalization        TOTAL TIME TAKING CARE OF THIS PATIENT: *30* minutes.  >50% time spent on counselling and coordination of care  Note: This dictation was prepared with Dragon dictation along with smaller phrase technology. Any transcriptional errors that result from this process are unintentional.  Fritzi Mandes M.D    [facility]   CC: Primary care physician; Patient, No Pcp Per (Inactive)Patient ID: [name], female   DOB: 07/15/1975, 45 y.o.   MRN: [id number]

## 2020-08-24 NOTE — Plan of Care (Signed)
  Problem: Education: Goal: Knowledge of General Education information will improve Description: Including pain rating scale, medication(s)/side effects and non-pharmacologic comfort measures Outcome: Progressing   Problem: Clinical Measurements: Goal: Respiratory complications will improve Outcome: Progressing   Problem: Elimination: Goal: Will not experience complications related to bowel motility Outcome: Progressing   Problem: Elimination: Goal: Will not experience complications related to urinary retention Outcome: Progressing

## 2020-08-24 NOTE — Op Note (Signed)
OPERATIVE NOTE    PRE-OPERATIVE DIAGNOSIS: 1. ESRD   POST-OPERATIVE DIAGNOSIS: same as above  PROCEDURE: 1. Ultrasound guidance for vascular access to the right internal jugular vein 2. Fluoroscopic guidance for placement of catheter 3. Placement of a 23 cm tip to cuff tunneled hemodialysis catheter via the right internal jugular vein  SURGEON: [name], MD  ANESTHESIA:  Local with Moderate conscious sedation for approximately 37 minutes using 3 mg of Versed and 50 mcg of Fentanyl  ESTIMATED BLOOD LOSS: 10 cc  FLUORO TIME: less than one minute  CONTRAST: none  FINDING(S): 1.  Patent right internal jugular vein  SPECIMEN(S):  None  INDICATIONS:   [name] is a 45 y.o.female who presents with renal failure.  The patient needs long term dialysis access for their ESRD, and a Permcath is necessary.  Risks and benefits are discussed and informed consent is obtained.    DESCRIPTION: After obtaining full informed written consent, the patient was brought back to the vascular suited. The patient's right neck and chest were sterilely prepped and draped in a sterile surgical field was created. Moderate conscious sedation was administered during a face to face encounter with the patient throughout the procedure with my supervision of the RN administering medicines and monitoring the patient's vital signs, pulse oximetry, telemetry and mental status throughout from the start of the procedure until the patient was taken to the recovery room.  The right internal jugular vein was visualized with ultrasound and found to be patent. It was then accessed under direct ultrasound guidance and a permanent image was recorded. A wire was placed. After skin nick and dilatation, the peel-away sheath was placed over the wire. I then turned my attention to an area under the clavicle. Approximately 1-2 fingerbreadths below the clavicle a small counterincision was created and tunneled from the subclavicular  incision to the access site. Using fluoroscopic guidance, a 23 centimeter tip to cuff tunneled hemodialysis catheter was selected, and tunneled from the subclavicular incision to the access site. It was then placed through the peel-away sheath and the peel-away sheath was removed. Using fluoroscopic guidance the catheter tips were parked in the right atrium. The appropriate distal connectors were placed. It withdrew blood well and flushed easily with heparinized saline and a concentrated heparin solution was then placed. It was secured to the chest wall with 2 Prolene sutures. The access incision was closed single 4-0 Monocryl. A 4-0 Monocryl pursestring suture was placed around the exit site. Sterile dressings were placed. The patient tolerated the procedure well and was taken to the recovery room in stable condition.  COMPLICATIONS: None  CONDITION: Stable  [name], MD 08/24/2020 5:35 PM   This note was created with Dragon Medical transcription system. Any errors in dictation are purely unintentional.

## 2020-08-24 NOTE — Consult Note (Signed)
[facility] Vascular Consult Note  MRN : [id number]  [name] is a 45 y.o. (08-30-1975) female who presents with chief complaint of  Chief Complaint  Patient presents with  . Chest Pain   History of Present Illness: [name] is a 45 y.o. female with medical history significant for hypertension, previously insulin-dependent diabetes mellitus, CKD 4, morbid obesity, metabolic acidosis secondary to poor renal function, history of recurrent urinary tract infection, presents to the emergency department for chief concerns of shortness of breath.  She developed shortness of breath over the last 4 days.  She endorsed that she is compliant with her medication except for her insulin.  She states that she does take her new dose of furosemide 80 mg twice daily.  She also endorses chest tenderness.  She reports that she drinks approximately 4-5 bottles of 16.9 ounce water per day.  She reports that the shortness of breath is present at rest and worse with ambulation.  She denies fever, vomiting, or diarrhea.  She states she still makes urine.  She does endorse that in the last few days since she has had her increased dose of fluid medications she is urinating more than normal.  She reports to the ED provider that she has stopped taking her insulin because she believed that the insulin contributed to her renal deterioration.  EDP specifically asked patient if she sees a kidney doctor and she said no.  She states that the only medication she takes is a blood pressure medicine and that she does not have any previous kidney problems.  Vascular surgery was consulted by NP breeze for placement of a PermCath.  Current Facility-Administered Medications  Medication Dose Route Frequency Provider Last Rate Last Admin  . acetaminophen (TYLENOL) tablet 650 mg  650 mg Oral Q6H PRN Cox, Amy N, DO   650 mg at 08/24/20 0522   Or  . acetaminophen (TYLENOL) suppository 650 mg  650  mg Rectal Q6H PRN Cox, Amy N, DO      . amLODipine (NORVASC) tablet 10 mg  10 mg Oral Daily Cox, Amy N, DO   10 mg at 08/24/20 1040  . calcium carbonate (TUMS - dosed in mg elemental calcium) chewable tablet 200 mg of elemental calcium  1 tablet Oral TID WC Cox, Amy N, DO   200 mg of elemental calcium at 08/24/20 1311  . heparin injection 5,000 Units  5,000 Units Subcutaneous Q8H Cox, Amy N, DO   5,000 Units at 08/24/20 1311  . insulin aspart (novoLOG) injection 0-5 Units  0-5 Units Subcutaneous QHS Cox, Amy N, DO      . insulin aspart (novoLOG) injection 0-6 Units  0-6 Units Subcutaneous TID WC Cox, Amy N, DO      . ipratropium-albuterol (DUONEB) 0.5-2.5 (3) MG/3ML nebulizer solution 3 mL  3 mL Nebulization Q4H PRN Sharion Settler, NP      . labetalol (NORMODYNE) injection 10 mg  10 mg Intravenous Q4H PRN Cox, Amy N, DO      . metoprolol succinate (TOPROL-XL) 24 hr tablet 25 mg  25 mg Oral Daily Cox, Amy N, DO   25 mg at 08/24/20 1041  . ondansetron (ZOFRAN) tablet 4 mg  4 mg Oral Q6H PRN Cox, Amy N, DO       Or  . ondansetron (ZOFRAN) injection 4 mg  4 mg Intravenous Q6H PRN Cox, Amy N, DO      . sertraline (ZOLOFT) tablet 50 mg  50 mg  Oral Daily Sharion Settler, NP   50 mg at 08/24/20 1040   Past Medical History:  Diagnosis Date  . Asthma   . Diabetes ([place])   . Hypertension   . Renal disorder    Past Surgical History:  Procedure Laterality Date  . CESAREAN SECTION N/A   . CHOLECYSTECTOMY     Social History Social History   Tobacco Use  . Smoking status: Never Smoker  . Smokeless tobacco: Never Used  Vaping Use  . Vaping Use: Never used  Substance Use Topics  . Alcohol use: Never   Family History Family History  Problem Relation Age of Onset  . Heart attack Father        died at ~1 years of age  Denies family history of peripheral artery disease, venous disease or renal disease.  Allergies  Allergen Reactions  . Cholesterol Rash    Cholesterol medicine   REVIEW  OF SYSTEMS (Negative unless checked)  Constitutional: '[]'$ Weight loss  '[]'$ Fever  '[]'$ Chills Cardiac: '[]'$ Chest pain   '[]'$ Chest pressure   '[]'$ Palpitations   '[]'$ Shortness of breath when laying flat   '[]'$ Shortness of breath at rest   '[x]'$ Shortness of breath with exertion. Vascular:  '[]'$ Pain in legs with walking   '[]'$ Pain in legs at rest   '[]'$ Pain in legs when laying flat   '[]'$ Claudication   '[]'$ Pain in feet when walking  '[]'$ Pain in feet at rest  '[]'$ Pain in feet when laying flat   '[]'$ History of DVT   '[]'$ Phlebitis   '[x]'$ Swelling in legs   '[]'$ Varicose veins   '[]'$ Non-healing ulcers Pulmonary:   '[]'$ Uses home oxygen   '[]'$ Productive cough   '[]'$ Hemoptysis   '[]'$ Wheeze  '[]'$ COPD   '[]'$ Asthma Neurologic:  '[]'$ Dizziness  '[]'$ Blackouts   '[]'$ Seizures   '[]'$ History of stroke   '[]'$ History of TIA  '[]'$ Aphasia   '[]'$ Temporary blindness   '[]'$ Dysphagia   '[]'$ Weakness or numbness in arms   '[]'$ Weakness or numbness in legs Musculoskeletal:  '[]'$ Arthritis   '[]'$ Joint swelling   '[]'$ Joint pain   '[]'$ Low back pain Hematologic:  '[]'$ Easy bruising  '[]'$ Easy bleeding   '[]'$ Hypercoagulable state   '[]'$ Anemic  '[]'$ Hepatitis Gastrointestinal:  '[]'$ Blood in stool   '[]'$ Vomiting blood  '[]'$ Gastroesophageal reflux/heartburn   '[]'$ Difficulty swallowing. Genitourinary:  '[x]'$ Chronic kidney disease   '[]'$ Difficult urination  '[]'$ Frequent urination  '[]'$ Burning with urination   '[]'$ Blood in urine Skin:  '[]'$ Rashes   '[]'$ Ulcers   '[]'$ Wounds Psychological:  '[]'$ History of anxiety   '[]'$  History of major depression.  Physical Examination  Vitals:   08/24/20 0830 08/24/20 0835 08/24/20 1215 08/24/20 1216  BP:  136/79  (!) 162/99  Pulse: 73 70  70  Resp: 17   16  Temp: 98.5 F (36.9 C)  98.2 F (36.8 C)   TempSrc: Oral  Axillary   SpO2: 100%   100%  Weight:      Height:       Body mass index is 54.72 kg/m. Gen:  WD/WN, NAD Head: [place]/AT, No temporalis wasting. Prominent temp pulse not noted. Ear/Nose/Throat: Hearing grossly intact, nares w/o erythema or drainage, oropharynx w/o Erythema/Exudate Eyes: Sclera non-icteric,  conjunctiva clear Neck: Trachea midline.  No JVD.  Pulmonary:  Good air movement, respirations not labored, equal bilaterally.  Cardiac: RRR, normal S1, S2. Vascular:  Vessel Right Left  Radial Palpable Palpable  Ulnar Palpable Palpable  Brachial Palpable Palpable  Carotid Palpable, without bruit Palpable, without bruit  Aorta Not palpable N/A  Femoral Palpable Palpable  Popliteal Palpable Palpable  PT Palpable Palpable  DP Palpable  Palpable   Gastrointestinal: soft, non-tender/non-distended. No guarding/reflex.  Musculoskeletal: M/S 5/5 throughout.  Extremities without ischemic changes.  No deformity or atrophy. No edema. Neurologic: Sensation grossly intact in extremities.  Symmetrical.  Speech is fluent. Motor exam as listed above. Psychiatric: Judgment intact, Mood & affect appropriate for pt's clinical situation. Dermatologic: No rashes or ulcers noted.  No cellulitis or open wounds. Lymph : No Cervical, Axillary, or Inguinal lymphadenopathy.  CBC Lab Results  Component Value Date   WBC 8.8 08/24/2020   HGB 8.3 (L) 08/24/2020   HCT 25.3 (L) 08/24/2020   MCV 85.5 08/24/2020   PLT 241 08/24/2020   BMET    Component Value Date/Time   NA 138 08/24/2020 0508   K 4.9 08/24/2020 0508   CL 111 08/24/2020 0508   CO2 18 (L) 08/24/2020 0508   GLUCOSE 97 08/24/2020 0508   BUN 56 (H) 08/24/2020 0508   CREATININE 8.46 (H) 08/24/2020 0508   CALCIUM 6.3 (LL) 08/24/2020 0508   GFRNONAA 5 (L) 08/24/2020 0508   GFRAA 17 (L) 12/13/2019 0514   Estimated Creatinine Clearance: 10.8 mL/min (A) (by C-G formula based on SCr of 8.46 mg/dL (H)).  COAG No results found for: INR, PROTIME  Radiology DG Chest 2 View  Result Date: 08/23/2020 CLINICAL DATA:  Chest pain for the past 2 days with shortness of breath. History of asthma. EXAM: CHEST - 2 VIEW COMPARISON:  12/08/2019 FINDINGS: Borderline enlarged cardiac silhouette with a minimal increase in size. Tortuous aorta. Stable linear  scarring at the left lung base. Otherwise, clear lungs. Thoracic spine degenerative changes. IMPRESSION: No acute abnormality. Electronically Signed   By: Claudie Revering M.D.   On: 08/23/2020 18:51   Assessment/Plan [name] is a 45 y.o. female with medical history significant for hypertension, previously insulin-dependent diabetes mellitus, CKD 4, morbid obesity, metabolic acidosis secondary to poor renal function, history of recurrent urinary tract infection, presents to the emergency department for chief concerns of shortness of breath found to be in acute on chronic kidney failure.  1.  Acute on chronic kidney failure:  Patient presents with acute on chronic renal failure.  Nephrology would like to initiate dialysis at this time however she does not have an adequate access.  Recommend placing a PermCath to allow the patient to dialyze in the inpatient outpatient setting.  Procedure, risks and benefits were explained to the patient.  All questions were answered.  Patient wishes to proceed.  2.  Diabetes Uncontrolled. Encouraged good control as its slows the progression of atherosclerotic disease  3.  Obesity: Encourage a healthy diet and exercise.  Discussed with Dr. Mayme Genta, PA-C  08/24/2020 1:39 PM  This note was created with Dragon medical transcription system.  Any error is purely unintentional.

## 2020-08-25 ENCOUNTER — Encounter: Payer: Self-pay | Admitting: Vascular Surgery

## 2020-08-25 LAB — BASIC METABOLIC PANEL
Anion gap: 10 (ref 5–15)
BUN: 58 mg/dL — ABNORMAL HIGH (ref 6–20)
CO2: 18 mmol/L — ABNORMAL LOW (ref 22–32)
Calcium: 6.8 mg/dL — ABNORMAL LOW (ref 8.9–10.3)
Chloride: 108 mmol/L (ref 98–111)
Creatinine, Ser: 8.62 mg/dL — ABNORMAL HIGH (ref 0.44–1.00)
GFR, Estimated: 5 mL/min — ABNORMAL LOW (ref >60)
Glucose, Bld: 158 mg/dL — ABNORMAL HIGH (ref 70–99)
Potassium: 6.1 mmol/L — ABNORMAL HIGH (ref 3.5–5.1)
Sodium: 136 mmol/L (ref 135–145)

## 2020-08-25 LAB — GLUCOSE, CAPILLARY
Glucose-Capillary: 113 mg/dL — ABNORMAL HIGH (ref 70–99)
Glucose-Capillary: 99 mg/dL (ref 70–99)
Glucose-Capillary: 165 mg/dL — ABNORMAL HIGH (ref 70–99)
Glucose-Capillary: 195 mg/dL — ABNORMAL HIGH (ref 70–99)

## 2020-08-25 LAB — HEPATITIS B CORE ANTIBODY, TOTAL: Hep B Core Total Ab: NONREACTIVE

## 2020-08-25 LAB — HEPATITIS B SURFACE ANTIGEN: Hepatitis B Surface Ag: NONREACTIVE

## 2020-08-25 LAB — CALCIUM, IONIZED: Calcium, Ionized, Serum: 3.4 mg/dL — ABNORMAL LOW (ref 4.5–5.6)

## 2020-08-25 MED ORDER — HEPARIN SODIUM (PORCINE) 1000 UNIT/ML DIALYSIS: 1000.0000 [IU] | INTRAMUSCULAR | Status: DC | PRN

## 2020-08-25 MED ORDER — PROCHLORPERAZINE EDISYLATE 10 MG/2ML IJ SOLN
10.0000 mg | Freq: Once | INTRAMUSCULAR | Status: AC
  Administered 2020-08-25: 10 mg via INTRAVENOUS
  Filled 2020-08-25: qty 2

## 2020-08-25 MED ORDER — LIDOCAINE HCL (PF) 1 % IJ SOLN
5.0000 mL | INTRAMUSCULAR | Status: DC | PRN
  Filled 2020-08-25: qty 5

## 2020-08-25 MED ORDER — PATIROMER SORBITEX CALCIUM 8.4 G PO PACK
8.4000 g | PACK | Freq: Every day | ORAL | Status: DC
  Filled 2020-08-25: qty 1

## 2020-08-25 MED ORDER — OXYCODONE HCL 5 MG PO TABS
5.0000 mg | ORAL_TABLET | Freq: Four times a day (QID) | ORAL | Status: DC | PRN
  Administered 2020-08-25 – 2020-08-26 (×7): 5 mg via ORAL
  Filled 2020-08-25 (×7): qty 1

## 2020-08-25 MED ORDER — PATIROMER SORBITEX CALCIUM 8.4 G PO PACK
8.4000 g | PACK | Freq: Two times a day (BID) | ORAL | Status: DC
  Administered 2020-08-25: 8.4 g via ORAL
  Filled 2020-08-25 (×2): qty 1

## 2020-08-25 MED ORDER — SODIUM CHLORIDE 0.9 % IV SOLN: 100.0000 mL | INTRAVENOUS | Status: DC | PRN

## 2020-08-25 MED ORDER — LIDOCAINE-PRILOCAINE 2.5-2.5 % EX CREA
1.0000 "application " | TOPICAL_CREAM | CUTANEOUS | Status: DC | PRN
  Filled 2020-08-25: qty 5

## 2020-08-25 MED ORDER — ALTEPLASE 2 MG IJ SOLR: 2.0000 mg | Freq: Once | INTRAMUSCULAR | Status: DC | PRN

## 2020-08-25 MED ORDER — PENTAFLUOROPROP-TETRAFLUOROETH EX AERO
1.0000 "application " | INHALATION_SPRAY | CUTANEOUS | Status: DC | PRN
  Filled 2020-08-25: qty 30

## 2020-08-25 NOTE — TOC Initial Note (Signed)
Transition of Care [facility]) - Initial/Assessment Note    Patient Details  Name: [name] MRN: [id number] Date of Birth: 16-Jul-1975  Transition of Care [facility]) CM/SW Contact:    Beverly Sessions, RN Phone Number: 08/25/2020, 3:35 PM  Clinical Narrative:                  Patient admitted from home with AKI Patient states that she lives at home with her adult daughter and 2 sons Daughter provides transportation to appointments, and will be providing transportation to outpatient HD  Patient will be new outpatient HD set up this admission.  El[facility]a Bicker HD liaison is aware of admission.   Patient states that her PCP is [facility] and she is current there.  At this time patient plans to remain at [facility] Patient receives her medications from [facility] in [place], and [facility].  Denies issues obtaining medications.   Patient independent with mobility.  Patient states that she currently has a restraining order against her ex husband.  Patient states she does not know where he is currently at.  Patient states that she feels safe at her daughters home.    Patient states that she is currently working with a Chief Executive Officer in order to obtain her citizenship.  Expected Discharge Plan: Home/Self Care Barriers to Discharge: Continued Medical Work up   Patient Goals and CMS Choice        Expected Discharge Plan and Services Expected Discharge Plan: Home/Self Care   Discharge Planning Services: CM Consult   Living arrangements for the past 2 months: Single Family Home                                      Prior Living Arrangements/Services Living arrangements for the past 2 months: Single Family Home Lives with:: Adult Children Patient language and need for interpreter reviewed:: Yes Do you feel safe going back to the place where you live?: Yes      Need for Family Participation in Patient Care: Yes (Comment) Care giver support  system in place?: Yes (comment)   Criminal Activity/Legal Involvement Pertinent to Current Situation/Hospitalization: No - Comment as needed  Activities of Daily Living Home Assistive Devices/Equipment: Blood pressure cuff,CBG Meter ADL Screening (condition at time of admission) Patient's cognitive ability adequate to safely complete daily activities?: Yes Is the patient deaf or have difficulty hearing?: No Does the patient have difficulty seeing, even when wearing glasses/contacts?: No Does the patient have difficulty concentrating, remembering, or making decisions?: No Patient able to express need for assistance with ADLs?: Yes Does the patient have difficulty dressing or bathing?: No Independently performs ADLs?: Yes (appropriate for developmental age) Does the patient have difficulty walking or climbing stairs?: Yes Weakness of Legs: None Weakness of Arms/Hands: None  Permission Sought/Granted                  Emotional Assessment Appearance:: Appears stated age Attitude/Demeanor/Rapport: Engaged Affect (typically observed): Accepting Orientation: : Oriented to Self,Oriented to Place,Oriented to  Time,Oriented to Situation Alcohol / Substance Use: Not Applicable Psych Involvement: No (comment)  Admission diagnosis:  Hypocalcemia [E83.51] AKI (acute kidney injury) ([place]) [N17.9] Acute renal failure superimposed on stage 3 chronic kidney disease, unspecified acute renal failure type, unspecified whether stage 3a or 3b CKD ([place]) [N17.9, N18.30] Patient Active Problem List   Diagnosis Date Noted  . Acute renal failure  superimposed on stage 3 chronic kidney disease, unspecified acute renal failure type, unspecified whether stage 3a or 3b CKD ([place]) 08/24/2020  . ESRD (end stage renal disease) ([place]) 08/23/2020  . AKI (acute kidney injury) ([place]) 12/09/2019  . Obesity, Class III, BMI 40-49.9 (morbid obesity) ([place]) 12/09/2019  . Diabetes ([place])   . Hypertensive urgency   . Chest  pain    PCP:  Patient, No Pcp Per (Inactive) Pharmacy:   [facility] [place] [address] [place] [address] Phone: [phone] Fax: [phone]  [facility] [place], [place] [facility] [place] [address] [place] [address] Phone: [phone] Fax: [phone]     Social Determinants of Health (SDOH) Interventions    Readmission Risk Interventions No flowsheet data found.

## 2020-08-25 NOTE — Progress Notes (Signed)
Patient complained of nausea and vomited twice this am. Notified NP  Sharion Settler about pain medication and a stronger nausea medication because the zofran did not stop the nausea. One time order of compazine was ordered. Administered med to patient along with tylenol for low grade temp of 99.2.  Will continue to monitor. [name]

## 2020-08-25 NOTE — Progress Notes (Signed)
[facility] [place] NAME: [name]    MR#:  [id number]  DATE OF BIRTH:  06-Apr-1976  SUBJECTIVE:  patient appears to be a poor historian. No family at bedside. Came in with increasing shortness of breath. There is been issues with noncompliance with meds and diet at home. Overall feeling better today. Eating well.   REVIEW OF SYSTEMS:   Review of Systems  Constitutional: Negative for chills, fever and weight loss.  HENT: Negative for ear discharge, ear pain and nosebleeds.   Eyes: Negative for blurred vision, pain and discharge.  Respiratory: Negative for sputum production, wheezing and stridor.   Cardiovascular: Negative for chest pain, palpitations, orthopnea and PND.  Gastrointestinal: Negative for abdominal pain, diarrhea, nausea and vomiting.  Genitourinary: Negative for frequency and urgency.  Musculoskeletal: Negative for back pain and joint pain.  Neurological: Negative for sensory change, speech change, focal weakness and weakness.  Psychiatric/Behavioral: Negative for depression and hallucinations. The patient is not nervous/anxious.    Tolerating Diet:yes Tolerating PT: self ambulatory  DRUG ALLERGIES:   Allergies  Allergen Reactions  . Cholesterol Rash    Cholesterol medicine    VITALS:  Blood pressure (!) 157/88, pulse 80, temperature 99.2 F (37.3 C), temperature source Oral, resp. rate 16, height '5\' 1"'$  (1.549 m), weight 131.4 kg, SpO2 97 %.  PHYSICAL EXAMINATION:   Physical Exam  GENERAL:  45 y.o.-year-old patient lying in the bed with no acute distress.  MORBIDLY OBESE!! LUNGS: decreased breath sounds bilaterally, no wheezing, rales, rhonchi. No use of accessory muscles of respiration.  CARDIOVASCULAR: S1, S2 normal. No murmurs, rubs, or gallops.  ABDOMEN: Soft, nontender, nondistended. Bowel sounds present. No organomegaly or mass. abd obesity EXTREMITIES: No cyanosis, clubbing or edema b/l.     NEUROLOGIC: nonfocal   PSYCHIATRIC:  patient is alert and oriented x 3.  SKIN: No obvious rash, lesion, or ulcer.   LABORATORY PANEL:  CBC Recent Labs  Lab 08/24/20 0508  WBC 8.8  HGB 8.3*  HCT 25.3*  PLT 241    Chemistries  Recent Labs  Lab 08/23/20 1956 08/24/20 0508 08/25/20 0530  NA  --  138 136  K  --  4.9 6.1*  CL  --  111 108  CO2  --  18* 18*  GLUCOSE  --  97 158*  BUN  --  56* 58*  CREATININE  --  8.46* 8.62*  CALCIUM  --  6.3* 6.8*  MG 1.8  --   --   AST 14* 14*  --   ALT 10 9  --   ALKPHOS 100 82  --   BILITOT 0.3 0.5  --    Cardiac Enzymes No results for input(s): TROPONINI in the last 168 hours. RADIOLOGY:  DG Chest 2 View  Result Date: 08/23/2020 CLINICAL DATA:  Chest pain for the past 2 days with shortness of breath. History of asthma. EXAM: CHEST - 2 VIEW COMPARISON:  12/08/2019 FINDINGS: Borderline enlarged cardiac silhouette with a minimal increase in size. Tortuous aorta. Stable linear scarring at the left lung base. Otherwise, clear lungs. Thoracic spine degenerative changes. IMPRESSION: No acute abnormality. Electronically Signed   By: Claudie Revering M.D.   On: 08/23/2020 18:51   PERIPHERAL VASCULAR CATHETERIZATION  Result Date: 08/24/2020 See op note  ASSESSMENT AND PLAN:  [name] a 45 y.o.femalewith medical history significant forhypertension, previously insulin-dependent diabetes mellitus, CKD 4, morbid obesity, metabolic acidosis secondary to poor renal function, history  of recurrent urinary tract infection, presents to the emergency department for chief concerns of shortness of breath.  AKI on CKD4--progressed to ESRD -- multiple risk factors with diabetes, morbid obesity, hypertension -- seen by nephrology Dr. Zollie Scale. Patient will be started on hemodialysis.  --vascular consult for HD access-- patient has tunnel catheter --4/12-- started on dialysis  Hypocalcemia-suspect secondary to CKD 4 and now likely end-stage renal  disease - resumedTums  Anemia of chronic disease-decreased from baseline of 10.6-11.5 -No evidence of bleeding at this time, she denies black stool, diarrhea, bright red blood per rectum -- hemoglobin stable at 8.3  Hyperkalemia-secondary to acute kidney injury, treat as above -- per RN patient received treatment for elevated K by Dr. Holley Raring today  Hypertensive urgency-query medication noncompliance -- continue amlodipine and metoprolol  Type II diabetes, insulin-dependent with CKD stage V  -- patient has been noncompliant with meds and diet -- continue sliding scale and Lantus  Obesity-follow-up with PCP for diet and exercise guided weight loss  Metabolic syndrome     DVT prophylaxis: Heparin 5000 units every 8 hours subcutaneous Code Status: Full code Diet: Renal with fluid restriction Family Communication: daughter [name] on 4/11 Disposition Plan: Pending clinical course Consults called: Nephrology  Level of care: Med-Surg Status is: Inpatient  Remains inpatient appropriate because:Inpatient level of care appropriate due to severity of illness   Dispo: The patient is from: Home              Anticipated d/c is to: Home              Patient currently is not medically stable to d/c.   Difficult to place patient No   Patient to be started on dialysis during this hospitalization        TOTAL TIME TAKING CARE OF THIS PATIENT: *25* minutes.  >50% time spent on counselling and coordination of care  Note: This dictation was prepared with Dragon dictation along with smaller phrase technology. Any transcriptional errors that result from this process are unintentional.  Fritzi Mandes M.D    [facility]   CC: Primary care physician; Patient, No Pcp Per (Inactive)Patient ID: [name], female   DOB: 12-Nov-1975, 45 y.o.   MRN: [id number]

## 2020-08-25 NOTE — Care Management (Signed)
Amanda Morris dialysis liaison notified of admission.    

## 2020-08-25 NOTE — Progress Notes (Signed)
Met with patient today, could not get patient it interact due to sleepiness. Could not identify which clinic patient wanted to get set up at. Was able to verify that patient does not have insurance, however patient was too sleepy to answer questions. Referral not sent, will meet with patient again on Thursday. Please contact me with any dialysis placement concerns.  Elvera Bicker Dialysis Coordinator [phone]

## 2020-08-25 NOTE — Progress Notes (Signed)
Notified Dr. Holley Raring about potassium being 6.1. See new orders.

## 2020-08-25 NOTE — Progress Notes (Addendum)
[facility], [place] 08/25/20  Subjective:   LOS: 1 [name] is a 45 y.o. female with past medical history of diabetes type 2, hypertension, CKD5, morbid obesity, anemia and metabolic acidosis. She presented to the ED with complaints of shortness of breath and was also found to have severe renal dysfunction. She is a patient at [facility] nephrology.   Patient is seen resting in bed  Alert and oriented States she is tired today Denies nausea and shortness of breath   Objective:  Vital signs in last 24 hours:  Temp:  [97.6 F (36.4 C)-99.2 F (37.3 C)] 98.9 F (37.2 C) (04/12 1350) Pulse Rate:  [68-115] 84 (04/12 1350) Resp:  [11-24] 18 (04/12 1350) BP: (146-192)/(81-110) 168/84 (04/12 1350) SpO2:  [94 %-100 %] 98 % (04/12 1350)  Weight change:  Filed Weights   08/23/20 1755 08/24/20 0519  Weight: 127.9 kg 131.4 kg    Intake/Output:    Intake/Output Summary (Last 24 hours) at 08/25/2020 1445 Last data filed at 08/25/2020 1300 Gross per 24 hour  Intake 356 ml  Output 68 ml  Net 288 ml     Physical Exam: General: No acute distress  HEENT Normocephelic, moist oral membranes  Pulm/lungs Clear bilaterally, normal breathing effort  CVS/Heart S1S2 present, no rubs or gallops  Abdomen:  Soft, non tender  Extremities: 1+ peripheral edema  Neurologic: Alert and oriented  Skin: No lesions or rashes  Access: Rt IJ permcath       Basic Metabolic Panel:  Recent Labs  Lab 08/23/20 1759 08/23/20 1956 08/24/20 0508 08/25/20 0530  NA 136  --  138 136  K 5.4*  --  4.9 6.1*  CL 108  --  111 108  CO2 20*  --  18* 18*  GLUCOSE 152*  --  97 158*  BUN 54*  --  56* 58*  CREATININE 8.52*  --  8.46* 8.62*  CALCIUM 6.1*  --  6.3* 6.8*  MG  --  1.8  --   --      CBC: Recent Labs  Lab 08/23/20 1759 08/24/20 0508  WBC 9.7 8.8  HGB 8.3* 8.3*  HCT 25.0* 25.3*  MCV 85.6 85.5  PLT 257 241      Lab Results  Component Value Date    HEPBSAG NON REACTIVE 08/25/2020      Microbiology:  Recent Results (from the past 240 hour(s))  SARS CORONAVIRUS 2 (TAT 6-24 HRS) Nasopharyngeal Nasopharyngeal Swab     Status: None   Collection Time: 08/23/20  7:56 PM   Specimen: Nasopharyngeal Swab  Result Value Ref Range Status   SARS Coronavirus 2 NEGATIVE NEGATIVE Final    Comment: (NOTE) SARS-CoV-2 target nucleic acids are NOT DETECTED.  The SARS-CoV-2 RNA is generally detectable in upper and lower respiratory specimens during the acute phase of infection. Negative results do not preclude SARS-CoV-2 infection, do not rule out co-infections with other pathogens, and should not be used as the sole basis for treatment or other patient management decisions. Negative results must be combined with clinical observations, patient history, and epidemiological information. The expected result is Negative.  Fact Sheet for Patients: SugarRoll.be  Fact Sheet for Healthcare Providers: https://www.woods-mathews.com/  This test is not yet approved or cleared by the [place] FDA and  has been authorized for detection and/or diagnosis of SARS-CoV-2 by FDA under an Emergency Use Authorization (EUA). This EUA will remain  in effect (meaning this test can be used) for the duration  of the COVID-19 declaration under Se ction 564(b)(1) of the Act, 21 U.S.C. section 360bbb-3(b)(1), unless the authorization is terminated or revoked sooner.  Performed at [facility], [place] [address] [place], [place] [address]     Coagulation Studies: No results for input(s): LABPROT, INR in the last 72 hours.  Urinalysis: No results for input(s): COLORURINE, LABSPEC, PHURINE, GLUCOSEU, HGBUR, BILIRUBINUR, KETONESUR, PROTEINUR, UROBILINOGEN, NITRITE, LEUKOCYTESUR in the last 72 hours.  Invalid input(s): APPERANCEUR    Imaging: DG Chest 2 View  Result Date: 08/23/2020 CLINICAL DATA:  Chest pain for  the past 2 days with shortness of breath. History of asthma. EXAM: CHEST - 2 VIEW COMPARISON:  12/08/2019 FINDINGS: Borderline enlarged cardiac silhouette with a minimal increase in size. Tortuous aorta. Stable linear scarring at the left lung base. Otherwise, clear lungs. Thoracic spine degenerative changes. IMPRESSION: No acute abnormality. Electronically Signed   By: Claudie Revering M.D.   On: 08/23/2020 18:51   PERIPHERAL VASCULAR CATHETERIZATION  Result Date: 08/24/2020 See op note    Medications:    . amLODipine  10 mg Oral Daily  . calcium carbonate  1 tablet Oral TID WC  . Chlorhexidine Gluconate Cloth  6 each Topical Daily  . heparin  5,000 Units Subcutaneous Q8H  . insulin aspart  0-5 Units Subcutaneous QHS  . insulin aspart  0-6 Units Subcutaneous TID WC  . metoprolol succinate  25 mg Oral Daily  . patiromer  8.4 g Oral BID  . sertraline  50 mg Oral Daily   acetaminophen **OR** acetaminophen, ipratropium-albuterol, labetalol, ondansetron **OR** ondansetron (ZOFRAN) IV, oxyCODONE  Assessment/ Plan:  45 y.o. female with  was admitted on 08/23/2020 for  Principal Problem:   AKI (acute kidney injury) ([place]) Active Problems:   Obesity, Class III, BMI 40-49.9 (morbid obesity) ([place])   Diabetes ([place])   Hypertensive urgency   ESRD (end stage renal disease) ([place])   Acute renal failure superimposed on stage 3 chronic kidney disease, unspecified acute renal failure type, unspecified whether stage 3a or 3b CKD (HCC)  Hypocalcemia [E83.51] AKI (acute kidney injury) ([place]) [N17.9] Acute renal failure superimposed on stage 3 chronic kidney disease, unspecified acute renal failure type, unspecified whether stage 3a or 3b CKD ([place]) [N17.9, N18.30]  #. ESRD on dialysis Progressive renal dysfunction  Vascular placed Rt IJ permcath yesterday Received initial treatment today Tolerated fair Developed shortness of breath and nausea and vomiting during last 30 min. Maintained O2 sats of  95%-97%. Placed on 2L O2 for comfort. Vomited treated by care nurse. Will dialyze tomorrow D/c planning with Dialysis liaison in progress   #. Anemia of CKD  Lab Results  Component Value Date   HGB 8.3 (L) 08/24/2020   EPO 4000 units with dialysis Avoid blood products, if possible  #. Secondary hyperparathyroidism of renal origin N 25.81   No results found for: PTH No results found for: PHOS  Calcium Carbonate given with meals Monitor calcium and phos level during this admission   #. Diabetes type 2 with CKD Hgb A1c MFr Bld (%)  Date Value  08/23/2020 5.8 (H)  Glucose stable at this time  # Hyperkalemia Current level 6.1 [place] given Will correct some with dialysis   LOS: [place] 4/12/20222:45 PM  [place], [place]

## 2020-08-26 DIAGNOSIS — N186 End stage renal disease: Secondary | ICD-10-CM

## 2020-08-26 DIAGNOSIS — E1169 Type 2 diabetes mellitus with other specified complication: Secondary | ICD-10-CM

## 2020-08-26 LAB — BASIC METABOLIC PANEL
Anion gap: 10 (ref 5–15)
BUN: 47 mg/dL — ABNORMAL HIGH (ref 6–20)
CO2: 22 mmol/L (ref 22–32)
Calcium: 7 mg/dL — ABNORMAL LOW (ref 8.9–10.3)
Chloride: 105 mmol/L (ref 98–111)
Creatinine, Ser: 7.82 mg/dL — ABNORMAL HIGH (ref 0.44–1.00)
GFR, Estimated: 6 mL/min — ABNORMAL LOW (ref >60)
Glucose, Bld: 96 mg/dL (ref 70–99)
Potassium: 4.6 mmol/L (ref 3.5–5.1)
Sodium: 137 mmol/L (ref 135–145)

## 2020-08-26 LAB — HEPATITIS B E ANTIGEN: Hep B E Ag: NEGATIVE

## 2020-08-26 LAB — GLUCOSE, CAPILLARY
Glucose-Capillary: 92 mg/dL (ref 70–99)
Glucose-Capillary: 121 mg/dL — ABNORMAL HIGH (ref 70–99)
Glucose-Capillary: 101 mg/dL — ABNORMAL HIGH (ref 70–99)
Glucose-Capillary: 101 mg/dL — ABNORMAL HIGH (ref 70–99)

## 2020-08-26 LAB — HEPATITIS B SURFACE ANTIBODY, QUANTITATIVE
Hep B S AB Quant (Post): 4.8 m[IU]/mL — ABNORMAL LOW (ref >9.9)
Hep B S AB Quant (Post): <3.1 m[IU]/mL — ABNORMAL LOW (ref >9.9)

## 2020-08-26 MED ORDER — HEPARIN SODIUM (PORCINE) 1000 UNIT/ML IJ SOLN
4600.0000 [IU] | Freq: Once | INTRAMUSCULAR | Status: AC
  Administered 2020-08-26: 4600 [IU]

## 2020-08-26 MED ORDER — OXYCODONE HCL 5 MG PO TABS
5.0000 mg | ORAL_TABLET | ORAL | Status: DC | PRN
  Administered 2020-08-27 – 2020-09-01 (×21): 5 mg via ORAL
  Filled 2020-08-26 (×24): qty 1

## 2020-08-26 NOTE — Progress Notes (Signed)
[facility] PROGRESS NOTE   [name] [id number] DOB: 12-15-1975 DOA: 08/23/2020  PCP: Patient, No Pcp Per (Inactive)  Brief History/Interval Summary: 45 y.o.femalewith medical history significant forhypertension, previously insulin-dependent diabetes mellitus, CKD 4, morbid obesity, metabolic acidosis secondary to poor renal function, history of recurrent urinary tract infection, presents to the emergency department for chief concerns of shortness of breath.  Noted to have worsening of her renal function.  Was seen by nephrology and was started on hemodialysis.  Consultants: Nephrology  Procedures: Dialysis catheter placement.  Hemodialysis  Antibiotics: Anti-infectives (From admission, onward)   Start     Dose/Rate Route Frequency Ordered Stop   08/25/20 0000  ceFAZolin (ANCEF) IVPB 2g/100 mL premix  Status:  Discontinued       Note to Pharmacy: To be given in specials   2 g 200 mL/hr over 30 Minutes Intravenous  Once 08/24/20 1444 08/24/20 1850   08/24/20 1436  ceFAZolin (ANCEF) 1-4 GM/50ML-% IVPB       Note to Pharmacy: Maynor, Erin   : cabinet override      08/24/20 1436 08/25/20 0244      Subjective/Interval History: Patient states that she is feeling well.  Denies any chest pain.  Shortness of breath is improved.  Denies any vomiting but has been nauseated.  Feels fatigued.     Assessment/Plan:  AKI on CKD4--progressed to ESRD Patient with progression of her kidney disease to ESRD.  Nephrology is following.  Patient underwent dialysis access placement.  Started on dialysis on 4/12.  Management per nephrology.  Will need to be established with outpatient dialysis center as well prior to discharge.  Hypocalcemia Possibly secondary to CKD.  Management per nephrology.    Anemia of chronic disease Denies overt bleeding.  Baseline hemoglobin is around 10-11.  Noted to be low around 8.3.  Likely due to progression of kidney disease.    Hyperkalemia Resolved  Accelerated hypertension Blood pressure was poorly controlled.  Improved after she was dialyzed yesterday.  Noted to be on amlodipine and metoprolol currently.  Will not be too aggressive considering that she is now on hemodialysis.    Type II diabetes, insulin-dependent with CKD stage V  Patient with history of noncompliance.  Currently on Lantus and SSI.  HbA1c was 5.8 surprisingly.  Morbid obesity Estimated body mass index is 54.72 kg/m as calculated from the following:   Height as of this encounter: '5\' 1"'$  (1.549 m).   Weight as of this encounter: 131.4 kg.    DVT Prophylaxis: Subcutaneous heparin Code Status: Full code Family Communication: Discussed with the patient Disposition Plan:   Hopefully return home when improved  Status is: Inpatient  Remains inpatient appropriate because:IV treatments appropriate due to intensity of illness or inability to take PO and Inpatient level of care appropriate due to severity of illness   Dispo: The patient is from: Home              Anticipated d/c is to: Home              Patient currently is not medically stable to d/c.   Difficult to place patient No     Medications:  Scheduled: . amLODipine  10 mg Oral Daily  . calcium carbonate  1 tablet Oral TID WC  . Chlorhexidine Gluconate Cloth  6 each Topical Daily  . heparin  5,000 Units Subcutaneous Q8H  . insulin aspart  0-5 Units Subcutaneous QHS  . insulin aspart  0-6 Units Subcutaneous TID  WC  . metoprolol succinate  25 mg Oral Daily  . sertraline  50 mg Oral Daily   Continuous:  [id number] **OR** acetaminophen, ipratropium-albuterol, labetalol, ondansetron **OR** ondansetron (ZOFRAN) IV, oxyCODONE   Objective:  Vital Signs  Vitals:   08/25/20 2323 08/26/20 0418 08/26/20 0742 08/26/20 1147  BP: (!) 148/86 (!) 153/86 (!) 151/86 (!) 164/90  Pulse: 77 71 72 72  Resp: '12 20 16   '$ Temp: 98.6 F (37 C) 98.2 F (36.8 C) 98.4 F (36.9  C) 98.5 F (36.9 C)  TempSrc: Oral Oral Oral Oral  SpO2: 99% 99% 99% 99%  Weight:      Height:        Intake/Output Summary (Last 24 hours) at 08/26/2020 1208 Last data filed at 08/26/2020 1015 Gross per 24 hour  Intake 600 ml  Output 700 ml  Net -100 ml   Filed Weights   08/23/20 1755 08/24/20 0519  Weight: 127.9 kg 131.4 kg    General appearance: Awake alert.  In no distress Resp: Normal effort.  Diminished air entry at the bases with few crackles.  No wheezing or rhonchi. Cardio: S1-S2 is normal regular.  No S3-S4.  No rubs murmurs or bruit GI: Abdomen is soft.  Nontender nondistended.  Bowel sounds are present normal.  No masses organomegaly Extremities: Edema noted bilateral lower extremities. Neurologic: Alert and oriented x3.  No focal neurological deficits.    Lab Results:  Data Reviewed: I have personally reviewed following labs and imaging studies  CBC: Recent Labs  Lab 08/23/20 1759 08/24/20 0508  WBC 9.7 8.8  HGB 8.3* 8.3*  HCT 25.0* 25.3*  MCV 85.6 85.5  PLT 257 [id number]    Basic Metabolic Panel: Recent Labs  Lab 08/23/20 1759 08/23/20 1956 08/24/20 0508 08/25/20 0530 08/26/20 0406  NA 136  --  138 136 137  K 5.4*  --  4.9 6.1* 4.6  CL 108  --  111 108 105  CO2 20*  --  18* 18* 22  GLUCOSE 152*  --  97 158* 96  BUN 54*  --  56* 58* 47*  CREATININE 8.52*  --  8.46* 8.62* 7.82*  CALCIUM 6.1*  --  6.3* 6.8* 7.0*  MG  --  1.8  --   --   --     GFR: Estimated Creatinine Clearance: 11.6 mL/min (A) (by C-G formula based on SCr of 7.82 mg/dL (H)).  Liver Function Tests: Recent Labs  Lab 08/23/20 1956 08/24/20 0508  AST 14* 14*  ALT 10 9  ALKPHOS 100 82  BILITOT 0.3 0.5  PROT 6.0* 5.5*  ALBUMIN 2.7* 2.4*     HbA1C: Recent Labs    08/23/20 2202  HGBA1C 5.8*    CBG: Recent Labs  Lab 08/25/20 0750 08/25/20 1152 08/25/20 1641 08/25/20 2137 08/26/20 0749  GLUCAP 113* 99 165* 195* 92     Recent Results (from the past 240  hour(s))  SARS CORONAVIRUS 2 (TAT 6-24 HRS) Nasopharyngeal Nasopharyngeal Swab     Status: None   Collection Time: 08/23/20  7:56 PM   Specimen: Nasopharyngeal Swab  Result Value Ref Range Status   SARS Coronavirus 2 NEGATIVE NEGATIVE Final    Comment: (NOTE) SARS-CoV-2 target nucleic acids are NOT DETECTED.  The SARS-CoV-2 RNA is generally detectable in upper and lower respiratory specimens during the acute phase of infection. Negative results do not preclude SARS-CoV-2 infection, do not rule out co-infections with other pathogens, and should not be used as the sole  basis for treatment or other patient management decisions. Negative results must be combined with clinical observations, patient history, and epidemiological information. The expected result is Negative.  Fact Sheet for Patients: SugarRoll.be  Fact Sheet for Healthcare Providers: https://www.woods-mathews.com/  This test is not yet approved or cleared by the [place] FDA and  has been authorized for detection and/or diagnosis of SARS-CoV-2 by FDA under an Emergency Use Authorization (EUA). This EUA will remain  in effect (meaning this test can be used) for the duration of the COVID-19 declaration under Se ction 564(b)(1) of the Act, 21 U.S.C. section 360bbb-3(b)(1), unless the authorization is terminated or revoked sooner.  Performed at [facility], [place] [address] [place], [place] [address]       Radiology Studies: PERIPHERAL VASCULAR CATHETERIZATION  Result Date: 08/24/2020 See op note      LOS: 2 days   [facility] Pager on [email or web address]  08/26/2020, 12:08 PM

## 2020-08-26 NOTE — Progress Notes (Signed)
[facility], [place] 08/26/20  Subjective:   LOS: 2 [name] is a 45 y.o. female with past medical history of diabetes type 2, hypertension, CKD5, morbid obesity, anemia and metabolic acidosis. She presented to the ED with complaints of shortness of breath and was also found to have severe renal dysfunction. She is a patient at [facility] nephrology.   Patient is seen resting in bed  Alert and oriented States she feels better today but still tired Soreness at permcath site Able to eat meals but poor appetite Denies shortness of breath Denies further nausea and vomiting  Objective:  Vital signs in last 24 hours:  Temp:  [98.2 F (36.8 C)-99.2 F (37.3 C)] 98.5 F (36.9 C) (04/13 1147) Pulse Rate:  [71-80] 72 (04/13 1147) Resp:  [12-20] 16 (04/13 0742) BP: (148-181)/(86-95) 164/90 (04/13 1147) SpO2:  [97 %-99 %] 99 % (04/13 1147)  Weight change:  Filed Weights   08/23/20 1755 08/24/20 0519  Weight: 127.9 kg 131.4 kg    Intake/Output:    Intake/Output Summary (Last 24 hours) at 08/26/2020 1517 Last data filed at 08/26/2020 1015 Gross per 24 hour  Intake 480 ml  Output 700 ml  Net -220 ml     Physical Exam: General: No acute distress  HEENT Normocephelic, moist oral membranes  Pulm/lungs Clear bilaterally, normal breathing effort  CVS/Heart S1S2 present, no rubs or gallops  Abdomen:  Soft, non tender  Extremities: 1+ peripheral edema  Neurologic: Alert and oriented  Skin: No lesions or rashes  Access: Rt IJ permcath       Basic Metabolic Panel:  Recent Labs  Lab 08/23/20 1759 08/23/20 1956 08/24/20 0508 08/25/20 0530 08/26/20 0406  NA 136  --  138 136 137  K 5.4*  --  4.9 6.1* 4.6  CL 108  --  111 108 105  CO2 20*  --  18* 18* 22  GLUCOSE 152*  --  97 158* 96  BUN 54*  --  56* 58* 47*  CREATININE 8.52*  --  8.46* 8.62* 7.82*  CALCIUM 6.1*  --  6.3* 6.8* 7.0*  MG  --  1.8  --   --   --      CBC: Recent Labs  Lab  08/23/20 1759 08/24/20 0508  WBC 9.7 8.8  HGB 8.3* 8.3*  HCT 25.0* 25.3*  MCV 85.6 85.5  PLT 257 241      Lab Results  Component Value Date   HEPBSAG NON REACTIVE 08/25/2020      Microbiology:  Recent Results (from the past 240 hour(s))  SARS CORONAVIRUS 2 (TAT 6-24 HRS) Nasopharyngeal Nasopharyngeal Swab     Status: None   Collection Time: 08/23/20  7:56 PM   Specimen: Nasopharyngeal Swab  Result Value Ref Range Status   SARS Coronavirus 2 NEGATIVE NEGATIVE Final    Comment: (NOTE) SARS-CoV-2 target nucleic acids are NOT DETECTED.  The SARS-CoV-2 RNA is generally detectable in upper and lower respiratory specimens during the acute phase of infection. Negative results do not preclude SARS-CoV-2 infection, do not rule out co-infections with other pathogens, and should not be used as the sole basis for treatment or other patient management decisions. Negative results must be combined with clinical observations, patient history, and epidemiological information. The expected result is Negative.  Fact Sheet for Patients: SugarRoll.be  Fact Sheet for Healthcare Providers: https://www.woods-mathews.com/  This test is not yet approved or cleared by the [place] FDA and  has been authorized for  detection and/or diagnosis of SARS-CoV-2 by FDA under an Emergency Use Authorization (EUA). This EUA will remain  in effect (meaning this test can be used) for the duration of the COVID-19 declaration under Se ction 564(b)(1) of the Act, 21 U.S.C. section 360bbb-3(b)(1), unless the authorization is terminated or revoked sooner.  Performed at [facility], [place] [address] [place], [place] [address]     Coagulation Studies: No results for input(s): LABPROT, INR in the last 72 hours.  Urinalysis: No results for input(s): COLORURINE, LABSPEC, PHURINE, GLUCOSEU, HGBUR, BILIRUBINUR, KETONESUR, PROTEINUR, UROBILINOGEN, NITRITE,  LEUKOCYTESUR in the last 72 hours.  Invalid input(s): APPERANCEUR    Imaging: PERIPHERAL VASCULAR CATHETERIZATION  Result Date: 08/24/2020 See op note    Medications:    . amLODipine  10 mg Oral Daily  . calcium carbonate  1 tablet Oral TID WC  . Chlorhexidine Gluconate Cloth  6 each Topical Daily  . heparin  5,000 Units Subcutaneous Q8H  . insulin aspart  0-5 Units Subcutaneous QHS  . insulin aspart  0-6 Units Subcutaneous TID WC  . metoprolol succinate  25 mg Oral Daily  . sertraline  50 mg Oral Daily   acetaminophen **OR** acetaminophen, ipratropium-albuterol, labetalol, ondansetron **OR** ondansetron (ZOFRAN) IV, oxyCODONE  Assessment/ Plan:  45 y.o. female with  was admitted on 08/23/2020 for  Principal Problem:   AKI (acute kidney injury) ([place]) Active Problems:   Obesity, Class III, BMI 40-49.9 (morbid obesity) ([place])   Diabetes ([place])   Hypertensive urgency   ESRD (end stage renal disease) ([place])   Acute renal failure superimposed on stage 3 chronic kidney disease, unspecified acute renal failure type, unspecified whether stage 3a or 3b CKD (HCC)  Hypocalcemia [E83.51] AKI (acute kidney injury) ([place]) [N17.9] Acute renal failure superimposed on stage 3 chronic kidney disease, unspecified acute renal failure type, unspecified whether stage 3a or 3b CKD ([place]) [N17.9, N18.30]  #. ESRD on dialysis Progressive renal dysfunction  Vascular placed Rt IJ permcath 08/24/20 Discussed with patient that events during first treatment are common and should subside over the next few treatments Received initial treatment yesterday Scheduled for second treatment today D/c planning with Dialysis liaison in progress   #. Anemia of CKD  Lab Results  Component Value Date   HGB 8.3 (L) 08/24/2020   EPO with dialysis Avoid blood products in new dialysis pateints, if possible  #. Secondary hyperparathyroidism of renal origin N 25.81   No results found for: PTH No results found  for: PHOS  Calcium Carbonate with meals Monitor calcium and phos level during this admission   #. Diabetes type 2 with CKD Hgb A1c MFr Bld (%)  Date Value  08/23/2020 5.8 (H)  Glucose stable at this time Primary team managing SSi  # Hyperkalemia Current level 4.6    LOS: [place] 4/13/20223:17 [place], [place]

## 2020-08-27 LAB — GLUCOSE, CAPILLARY
Glucose-Capillary: 94 mg/dL (ref 70–99)
Glucose-Capillary: 96 mg/dL (ref 70–99)
Glucose-Capillary: 153 mg/dL — ABNORMAL HIGH (ref 70–99)
Glucose-Capillary: 117 mg/dL — ABNORMAL HIGH (ref 70–99)

## 2020-08-27 LAB — BASIC METABOLIC PANEL
Anion gap: 9 (ref 5–15)
BUN: 31 mg/dL — ABNORMAL HIGH (ref 6–20)
CO2: 25 mmol/L (ref 22–32)
Calcium: 7.4 mg/dL — ABNORMAL LOW (ref 8.9–10.3)
Chloride: 104 mmol/L (ref 98–111)
Creatinine, Ser: 6.05 mg/dL — ABNORMAL HIGH (ref 0.44–1.00)
GFR, Estimated: 8 mL/min — ABNORMAL LOW (ref >60)
Glucose, Bld: 93 mg/dL (ref 70–99)
Potassium: 4.1 mmol/L (ref 3.5–5.1)
Sodium: 138 mmol/L (ref 135–145)

## 2020-08-27 LAB — CBC
HCT: 24.8 % — ABNORMAL LOW (ref 36.0–46.0)
Hemoglobin: 8.3 g/dL — ABNORMAL LOW (ref 12.0–15.0)
MCH: 28.5 pg (ref 26.0–34.0)
MCHC: 33.5 g/dL (ref 30.0–36.0)
MCV: 85.2 fL (ref 80.0–100.0)
Platelets: 231 10*3/uL (ref 150–400)
RBC: 2.91 MIL/uL — ABNORMAL LOW (ref 3.87–5.11)
RDW: 13.5 % (ref 11.5–15.5)
WBC: 8.3 10*3/uL (ref 4.0–10.5)
nRBC: 0 % (ref 0.0–0.2)

## 2020-08-27 MED ORDER — EPOETIN ALFA 10000 UNIT/ML IJ SOLN
4000.0000 [IU] | INTRAMUSCULAR | Status: DC
  Administered 2020-08-29: 4000 [IU] via INTRAVENOUS

## 2020-08-27 MED ORDER — METOPROLOL SUCCINATE ER 50 MG PO TB24
50.0000 mg | ORAL_TABLET | Freq: Every day | ORAL | Status: DC
  Administered 2020-08-28 – 2020-09-02 (×6): 50 mg via ORAL
  Filled 2020-08-27 (×2): qty 1
  Filled 2020-08-27: qty 2
  Filled 2020-08-27 (×4): qty 1

## 2020-08-27 MED ORDER — POLYETHYLENE GLYCOL 3350 17 G PO PACK
17.0000 g | PACK | Freq: Every day | ORAL | Status: DC
  Administered 2020-08-27: 17 g via ORAL
  Filled 2020-08-27: qty 1

## 2020-08-27 MED ORDER — METOPROLOL SUCCINATE ER 25 MG PO TB24
25.0000 mg | ORAL_TABLET | Freq: Once | ORAL | Status: AC
  Administered 2020-08-27: 25 mg via ORAL
  Filled 2020-08-27 (×2): qty 1

## 2020-08-27 NOTE — Progress Notes (Signed)
[facility] PROGRESS NOTE   [name] [id number] DOB: 09/12/1975 DOA: 08/23/2020  PCP: Patient, No Pcp Per (Inactive)  Brief History/Interval Summary: 45 y.o.femalewith medical history significant forhypertension, previously insulin-dependent diabetes mellitus, CKD 4, morbid obesity, metabolic acidosis secondary to poor renal function, history of recurrent urinary tract infection, presents to the emergency department for chief concerns of shortness of breath.  Noted to have worsening of her renal function.  Was seen by nephrology and was started on hemodialysis.  Consultants: Nephrology  Procedures: Dialysis catheter placement.  Hemodialysis  Antibiotics: Anti-infectives (From admission, onward)   Start     Dose/Rate Route Frequency Ordered Stop   08/25/20 0000  ceFAZolin (ANCEF) IVPB 2g/100 mL premix  Status:  Discontinued       Note to Pharmacy: To be given in specials   2 g 200 mL/hr over 30 Minutes Intravenous  Once 08/24/20 1444 08/24/20 1850   08/24/20 1436  ceFAZolin (ANCEF) 1-4 GM/50ML-% IVPB       Note to Pharmacy: Maynor, Erin   : cabinet override      08/24/20 1436 08/25/20 0244      Subjective/Interval History: Patient states that she is feeling better.  Less short of breath.  No nausea vomiting.  Eating her breakfast.      Assessment/Plan:  AKI on CKD4--progressed to ESRD Patient with progression of her kidney disease to ESRD.  Nephrology is following.  Patient underwent dialysis access placement.  Started on dialysis on 4/12.  She was dialyzed on 4/13.  Plan is to dialyze again today.   Management per nephrology.  Will need to be established with outpatient dialysis center as well prior to discharge.  Hypocalcemia Possibly secondary to CKD.  Management per nephrology.  Looks like she was started on calcium carbonate.  Corrected calcium is 8.6.  Anemia of chronic disease Denies overt bleeding.  Baseline hemoglobin is around 10-11.  Noted  to be low around 8.3.  Likely due to progression of kidney disease.  Hemoglobin is stable.  Hyperkalemia Resolved  Accelerated hypertension Blood pressure was poorly controlled. Noted to be on amlodipine and metoprolol currently.  Blood pressure was significantly elevated yesterday evening.  Could go up on the dose of her metoprolol.   Will not be too aggressive considering that she is now on hemodialysis.    Type II diabetes, insulin-dependent with CKD stage V  Patient with history of noncompliance.  Currently on SSI.  HbA1c was 5.8 surprisingly. CBGs noted to be borderline low.  Currently off of Lantus.  Just on SSI.  We will discontinue the at bedtime coverage.  Morbid obesity Estimated body mass index is 54.72 kg/m as calculated from the following:   Height as of this encounter: '5\' 1"'$  (1.549 m).   Weight as of this encounter: 131.4 kg.    DVT Prophylaxis: Subcutaneous heparin Code Status: Full code Family Communication: Discussed with the patient Disposition Plan:   Hopefully return home when outpatient dialysis had been established.  Status is: Inpatient  Remains inpatient appropriate because:IV treatments appropriate due to intensity of illness or inability to take PO and Inpatient level of care appropriate due to severity of illness   Dispo: The patient is from: Home              Anticipated d/c is to: Home              Patient currently is not medically stable to d/c.   Difficult to place patient No  Medications:  Scheduled: . amLODipine  10 mg Oral Daily  . calcium carbonate  1 tablet Oral TID WC  . Chlorhexidine Gluconate Cloth  6 each Topical Daily  . heparin  5,000 Units Subcutaneous Q8H  . insulin aspart  0-5 Units Subcutaneous QHS  . insulin aspart  0-6 Units Subcutaneous TID WC  . metoprolol succinate  25 mg Oral Daily  . sertraline  50 mg Oral Daily   Continuous:  [id number], labetalol, ondansetron **OR** ondansetron (ZOFRAN)  IV, oxyCODONE   Objective:  Vital Signs  Vitals:   08/27/20 0903 08/27/20 0915 08/27/20 0930 08/27/20 0945  BP: (!) 152/86 (!) 144/81 (!) 160/85 (!) 170/88  Pulse: 71 73 79 76  Resp: '18 17 17 15  '$ Temp: 99.1 F (37.3 C)     TempSrc: Oral     SpO2:      Weight:      Height:        Intake/Output Summary (Last 24 hours) at 08/27/2020 1049 Last data filed at 08/27/2020 0816 Gross per 24 hour  Intake --  Output 450 ml  Net -450 ml   Filed Weights   08/23/20 1755 08/24/20 0519  Weight: 127.9 kg 131.4 kg    General appearance: Awake alert.  In no distress Resp: Clear to auscultation bilaterally.  Normal effort Cardio: S1-S2 is normal regular.  No S3-S4.  No rubs murmurs or bruit GI: Abdomen is soft.  Nontender nondistended.  Bowel sounds are present normal.  No masses organomegaly Extremities: Edema bilateral lower extremities. Neurologic: Alert and oriented x3.  No focal neurological deficits.     Lab Results:  Data Reviewed: I have personally reviewed following labs and imaging studies  CBC: Recent Labs  Lab 08/23/20 1759 08/24/20 0508 08/27/20 0432  WBC 9.7 8.8 8.3  HGB 8.3* 8.3* 8.3*  HCT 25.0* 25.3* 24.8*  MCV 85.6 85.5 85.2  PLT 257 241 [id number]    Basic Metabolic Panel: Recent Labs  Lab 08/23/20 1759 08/23/20 1956 08/24/20 0508 08/25/20 0530 08/26/20 0406 08/27/20 0432  NA 136  --  138 136 137 138  K 5.4*  --  4.9 6.1* 4.6 4.1  CL 108  --  111 108 105 104  CO2 20*  --  18* 18* 22 25  GLUCOSE 152*  --  97 158* 96 93  BUN 54*  --  56* 58* 47* 31*  CREATININE 8.52*  --  8.46* 8.62* 7.82* 6.05*  CALCIUM 6.1*  --  6.3* 6.8* 7.0* 7.4*  MG  --  1.8  --   --   --   --     GFR: Estimated Creatinine Clearance: 15.1 mL/min (A) (by C-G formula based on SCr of 6.05 mg/dL (H)).  Liver Function Tests: Recent Labs  Lab 08/23/20 1956 08/24/20 0508  AST 14* 14*  ALT 10 9  ALKPHOS 100 82  BILITOT 0.3 0.5  PROT 6.0* 5.5*  ALBUMIN 2.7* 2.4*      HbA1C: No results for input(s): HGBA1C in the last 72 hours.  CBG: Recent Labs  Lab 08/26/20 0749 08/26/20 1209 08/26/20 1855 08/26/20 1946 08/27/20 0758  GLUCAP 92 121* 101* 101* 94     Recent Results (from the past 240 hour(s))  SARS CORONAVIRUS 2 (TAT 6-24 HRS) Nasopharyngeal Nasopharyngeal Swab     Status: None   Collection Time: 08/23/20  7:56 PM   Specimen: Nasopharyngeal Swab  Result Value Ref Range Status   SARS Coronavirus 2 NEGATIVE NEGATIVE Final  Comment: (NOTE) SARS-CoV-2 target nucleic acids are NOT DETECTED.  The SARS-CoV-2 RNA is generally detectable in upper and lower respiratory specimens during the acute phase of infection. Negative results do not preclude SARS-CoV-2 infection, do not rule out co-infections with other pathogens, and should not be used as the sole basis for treatment or other patient management decisions. Negative results must be combined with clinical observations, patient history, and epidemiological information. The expected result is Negative.  Fact Sheet for Patients: SugarRoll.be  Fact Sheet for Healthcare Providers: https://www.woods-mathews.com/  This test is not yet approved or cleared by the [place] FDA and  has been authorized for detection and/or diagnosis of SARS-CoV-2 by FDA under an Emergency Use Authorization (EUA). This EUA will remain  in effect (meaning this test can be used) for the duration of the COVID-19 declaration under Se ction 564(b)(1) of the Act, 21 U.S.C. section 360bbb-3(b)(1), unless the authorization is terminated or revoked sooner.  Performed at [facility], [place] [address], [place], [place] [address]       Radiology Studies: No results found.     LOS: 3 days   Scottlynn Lindell [employer] on [email or web address]  08/27/2020, 10:49 AM

## 2020-08-27 NOTE — Progress Notes (Signed)
Met with patient today, education provided. Spoke about insurance, patient stated that she thought [place] had started the process of getting Emergency Medicaid, she stated that she has some papers and will get her daughter to bring them tomorrow. Referral being sent to [facility], waiting on clinic acceptance and insurance verification. Please contact me with dialysis concerns.  Elvera Bicker Dialysis Coordinator  [phone]

## 2020-08-27 NOTE — TOC Progression Note (Signed)
Transition of Care [facility]) - Progression Note    Patient Details  Name: [name] MRN: [id number] Date of Birth: 02-May-1976  Transition of Care [facility]) CM/SW Contact  Beverly Sessions, RN Phone Number: 08/27/2020, 4:28 PM  Clinical Narrative:    Elvera Bicker HD coordinator still working on outpatient HD Patient requesting walker for discharge Referral made to [facility] with [employer]   Expected Discharge Plan: Home/Self Care Barriers to Discharge: Continued Medical Work up  Expected Discharge Plan and Services Expected Discharge Plan: Home/Self Care   Discharge Planning Services: CM Consult   Living arrangements for the past 2 months: Single Family Home                                       Social Determinants of Health (SDOH) Interventions    Readmission Risk Interventions No flowsheet data found.

## 2020-08-27 NOTE — Progress Notes (Signed)
Blood lines reversed due to high AP RN aware

## 2020-08-27 NOTE — Progress Notes (Signed)
[facility], [place] 08/27/20  Subjective:   LOS: 3 [name] is a 45 y.o. female with past medical history of diabetes type 2, hypertension, CKD5, morbid obesity, anemia and metabolic acidosis. She presented to the ED with complaints of shortness of breath and was also found to have severe renal dysfunction. She is a patient at [facility] nephrology.   Patient is seen resting in bed  Alert and oriented Tolerating meals Continues to complain of fatigue Denies nausea and diarrhea Patient seen later during dialysis   HEMODIALYSIS FLOWSHEET:  Blood Flow Rate (mL/min): 100 mL/min Arterial Pressure (mmHg): -80 mmHg Venous Pressure (mmHg): 70 mmHg Transmembrane Pressure (mmHg): 30 mmHg Ultrafiltration Rate (mL/min): 70 mL/min Dialysate Flow Rate (mL/min): 500 ml/min Conductivity: Machine : 14 Conductivity: Machine : 14 Dialysis Fluid Bolus: Normal Saline Bolus Amount (mL): 250 mL   Objective:  Vital signs in last 24 hours:  Temp:  [98.4 F (36.9 C)-99.1 F (37.3 C)] 99.1 F (37.3 C) (04/14 0903) Pulse Rate:  [70-79] 73 (04/14 1215) Resp:  [0-20] 17 (04/14 1215) BP: (138-191)/(80-97) 172/90 (04/14 1215) SpO2:  [95 %-98 %] 97 % (04/14 0506)  Weight change:  Filed Weights   08/23/20 1755 08/24/20 0519  Weight: 127.9 kg 131.4 kg    Intake/Output:    Intake/Output Summary (Last 24 hours) at 08/27/2020 1252 Last data filed at 08/27/2020 1215 Gross per 24 hour  Intake --  Output 450 ml  Net -450 ml     Physical Exam: General: No acute distress, fatigue  HEENT Normocephelic, moist oral membranes  Pulm/lungs Clear bilaterally, normal breathing effort  CVS/Heart S1S2 present, no rubs or gallops  Abdomen:  Soft, non tender  Extremities: 1+ peripheral edema  Neurologic: Alert and oriented  Skin: No lesions or rashes  Access: Rt IJ permcath       Basic Metabolic Panel:  Recent Labs  Lab 08/23/20 1759 08/23/20 1956 08/24/20 0508  08/25/20 0530 08/26/20 0406 08/27/20 0432  NA 136  --  138 136 137 138  K 5.4*  --  4.9 6.1* 4.6 4.1  CL 108  --  111 108 105 104  CO2 20*  --  18* 18* 22 25  GLUCOSE 152*  --  97 158* 96 93  BUN 54*  --  56* 58* 47* 31*  CREATININE 8.52*  --  8.46* 8.62* 7.82* 6.05*  CALCIUM 6.1*  --  6.3* 6.8* 7.0* 7.4*  MG  --  1.8  --   --   --   --      CBC: Recent Labs  Lab 08/23/20 1759 08/24/20 0508 08/27/20 0432  WBC 9.7 8.8 8.3  HGB 8.3* 8.3* 8.3*  HCT 25.0* 25.3* 24.8*  MCV 85.6 85.5 85.2  PLT 257 241 231      Lab Results  Component Value Date   HEPBSAG NON REACTIVE 08/25/2020      Microbiology:  Recent Results (from the past 240 hour(s))  SARS CORONAVIRUS 2 (TAT 6-24 HRS) Nasopharyngeal Nasopharyngeal Swab     Status: None   Collection Time: 08/23/20  7:56 PM   Specimen: Nasopharyngeal Swab  Result Value Ref Range Status   SARS Coronavirus 2 NEGATIVE NEGATIVE Final    Comment: (NOTE) SARS-CoV-2 target nucleic acids are NOT DETECTED.  The SARS-CoV-2 RNA is generally detectable in upper and lower respiratory specimens during the acute phase of infection. Negative results do not preclude SARS-CoV-2 infection, do not rule out co-infections with other pathogens, and should not  be used as the sole basis for treatment or other patient management decisions. Negative results must be combined with clinical observations, patient history, and epidemiological information. The expected result is Negative.  Fact Sheet for Patients: SugarRoll.be  Fact Sheet for Healthcare Providers: https://www.woods-mathews.com/  This test is not yet approved or cleared by the [place] FDA and  has been authorized for detection and/or diagnosis of SARS-CoV-2 by FDA under an Emergency Use Authorization (EUA). This EUA will remain  in effect (meaning this test can be used) for the duration of the COVID-19 declaration under Se ction 564(b)(1) of  the Act, 21 U.S.C. section 360bbb-3(b)(1), unless the authorization is terminated or revoked sooner.  Performed at [facility], [place] [address], [place], [place] [address]     Coagulation Studies: No results for input(s): LABPROT, INR in the last 72 hours.  Urinalysis: No results for input(s): COLORURINE, LABSPEC, PHURINE, GLUCOSEU, HGBUR, BILIRUBINUR, KETONESUR, PROTEINUR, UROBILINOGEN, NITRITE, LEUKOCYTESUR in the last 72 hours.  Invalid input(s): APPERANCEUR    Imaging: No results found.   Medications:    . amLODipine  10 mg Oral Daily  . calcium carbonate  1 tablet Oral TID WC  . Chlorhexidine Gluconate Cloth  6 each Topical Daily  . heparin  5,000 Units Subcutaneous Q8H  . insulin aspart  0-6 Units Subcutaneous TID WC  . metoprolol succinate  25 mg Oral Once  . [START ON 08/28/2020] metoprolol succinate  50 mg Oral Daily  . sertraline  50 mg Oral Daily   ipratropium-albuterol, labetalol, ondansetron **OR** ondansetron (ZOFRAN) IV, oxyCODONE  Assessment/ Plan:  45 y.o. female with past medical history of diabetes type 2, hypertension, CKD5, morbid obesity, anemia and metabolic acidosis was admitted on 08/23/2020 for  Principal Problem:   AKI (acute kidney injury) ([place]) Active Problems:   Obesity, Class III, BMI 40-49.9 (morbid obesity) ([place])   Diabetes ([place])   Hypertensive urgency   ESRD (end stage renal disease) ([place])   Acute renal failure superimposed on stage 3 chronic kidney disease, unspecified acute renal failure type, unspecified whether stage 3a or 3b CKD ([facility])  Hypocalcemia [E83.51] AKI (acute kidney injury) ([place]) [N17.9] Acute renal failure superimposed on stage 3 chronic kidney disease, unspecified acute renal failure type, unspecified whether stage 3a or 3b CKD ([place]) [N17.9, N18.30]  #. ESRD on dialysis Progressive renal dysfunction  Rt IJ permcath placed 08/24/20 Received second treatment yesterday Tolerated well Receiving third treatment  today Next treatment will be in chair to prepare for outpatient center  #. Anemia of CKD  Lab Results  Component Value Date   HGB 8.3 (L) 08/27/2020   EPO 4000 units with dialysis Avoid blood products in new dialysis pateints, if possible  #. Secondary hyperparathyroidism of renal origin N 25.81   No results found for: PTH No results found for: PHOS  Calcium Carbonate with meals Monitor calcium and phos level during this admission   #. Diabetes type 2 with CKD Hgb A1c MFr Bld (%)  Date Value  08/23/2020 5.8 (H)  Glucose managed during inpatient stay Primary team managing SSi  # Hyperkalemia Current level 4.1    LOS: [place] 4/14/202212:52 [place], [place]

## 2020-08-28 LAB — GLUCOSE, CAPILLARY
Glucose-Capillary: 99 mg/dL (ref 70–99)
Glucose-Capillary: 176 mg/dL — ABNORMAL HIGH (ref 70–99)
Glucose-Capillary: 137 mg/dL — ABNORMAL HIGH (ref 70–99)
Glucose-Capillary: 144 mg/dL — ABNORMAL HIGH (ref 70–99)

## 2020-08-28 MED ORDER — POLYETHYLENE GLYCOL 3350 17 G PO PACK
17.0000 g | PACK | Freq: Two times a day (BID) | ORAL | Status: DC
  Administered 2020-08-28 – 2020-09-02 (×9): 17 g via ORAL
  Filled 2020-08-28 (×9): qty 1

## 2020-08-28 NOTE — Progress Notes (Signed)
[facility] PROGRESS NOTE   [name] [id number] DOB: 1976-03-13 DOA: 08/23/2020  PCP: Patient, No Pcp Per (Inactive)  Brief History/Interval Summary: 45 y.o.femalewith medical history significant forhypertension, previously insulin-dependent diabetes mellitus, CKD 4, morbid obesity, metabolic acidosis secondary to poor renal function, history of recurrent urinary tract infection, presents to the emergency department for chief concerns of shortness of breath.  Noted to have worsening of her renal function.  Was seen by nephrology and was started on hemodialysis.  Consultants: Nephrology  Procedures: Dialysis catheter placement.  Hemodialysis  Antibiotics: Anti-infectives (From admission, onward)   Start     Dose/Rate Route Frequency Ordered Stop   08/25/20 0000  ceFAZolin (ANCEF) IVPB 2g/100 mL premix  Status:  Discontinued       Note to Pharmacy: To be given in specials   2 g 200 mL/hr over 30 Minutes Intravenous  Once 08/24/20 1444 08/24/20 1850   08/24/20 1436  ceFAZolin (ANCEF) 1-4 GM/50ML-% IVPB       Note to Pharmacy: Maynor, Erin   : cabinet override      08/24/20 1436 08/25/20 0244      Subjective/Interval History: Patient still has not had a bowel movement.  We will increase the dose of laxative.  She denies any other complaints at this time.  Shortness of breath is improving.  No nausea or vomiting.  Has been ambulating.    Assessment/Plan:  AKI on CKD4--progressed to ESRD Patient with progression of her kidney disease to ESRD.  Nephrology is following.  Patient underwent dialysis access placement.  Started on dialysis on 4/12.  Dialyzed again on 4/13 and 4/14.  Management per nephrology.  Will need to be established with outpatient dialysis center as well prior to discharge.  Constipation Increased dose of laxative.  TSH.  Hypocalcemia Possibly secondary to CKD.  Management per nephrology.  Looks like she was started on calcium carbonate.   Corrected calcium is 8.6.  Anemia of chronic disease Baseline hemoglobin is around 10-11.  Noted to be low around 8.3.  Likely due to progression of kidney disease.  Hemoglobin is stable.  No overt bleeding noted.  Hyperkalemia Resolved  Renal hypertension Blood pressure was poorly controlled. Noted to be on amlodipine and metoprolol.  Dose of metoprolol was increased due to poorly controlled blood pressure.  Will not be too aggressive since she is on hemodialysis.  Blood pressure noted to be better today.    Type II diabetes, insulin-dependent with CKD stage V  Patient with history of noncompliance.  Currently on SSI.  HbA1c was 5.8 surprisingly. Due to low CBGs she has been off of Lantus.  Low requirements likely due to progression of kidney disease  Morbid obesity Estimated body mass index is 54.72 kg/m as calculated from the following:   Height as of this encounter: '5\' 1"'$  (1.549 m).   Weight as of this encounter: 131.4 kg.    DVT Prophylaxis: Subcutaneous heparin Code Status: Full code Family Communication: Discussed with the patient Disposition Plan:   Hopefully return home when outpatient dialysis had been established.  Status is: Inpatient  Remains inpatient appropriate because:IV treatments appropriate due to intensity of illness or inability to take PO and Inpatient level of care appropriate due to severity of illness   Dispo: The patient is from: Home              Anticipated d/c is to: Home              Patient currently is  not medically stable to d/c.   Difficult to place patient No     Medications:  Scheduled: . amLODipine  10 mg Oral Daily  . calcium carbonate  1 tablet Oral TID WC  . Chlorhexidine Gluconate Cloth  6 each Topical Daily  . [START ON 08/29/2020] epoetin (EPOGEN/PROCRIT) injection  4,000 Units Intravenous Q T,Th,Sa-HD  . heparin  5,000 Units Subcutaneous Q8H  . insulin aspart  0-6 Units Subcutaneous TID WC  . metoprolol succinate  50 mg  Oral Daily  . polyethylene glycol  17 g Oral BID  . sertraline  50 mg Oral Daily   Continuous:  [id number], ondansetron **OR** ondansetron (ZOFRAN) IV, oxyCODONE   Objective:  Vital Signs  Vitals:   08/27/20 1945 08/28/20 0133 08/28/20 0437 08/28/20 0750  BP: (!) 156/84 131/70 (!) 149/89 (!) 151/86  Pulse: 72 74 73 79  Resp: '16 16 16 16  '$ Temp: (!) 97.5 F (36.4 C) 98.4 F (36.9 C) 99.2 F (37.3 C) 99.1 F (37.3 C)  TempSrc:   Oral Oral  SpO2: 97% 95% 94% 97%  Weight:      Height:        Intake/Output Summary (Last 24 hours) at 08/28/2020 1019 Last data filed at 08/28/2020 0900 Gross per 24 hour  Intake 360 ml  Output 550 ml  Net -190 ml   Filed Weights   08/23/20 1755 08/24/20 0519  Weight: 127.9 kg 131.4 kg    General appearance: Awake alert.  In no distress Resp: Clear to auscultation bilaterally.  Normal effort Cardio: S1-S2 is normal regular.  No S3-S4.  No rubs murmurs or bruit GI: Abdomen is soft.  Nontender nondistended.  Bowel sounds are present normal.  No masses organomegaly Extremities: Improving edema bilateral lower extremities. Neurologic: Alert and oriented x3.  No focal neurological deficits.      Lab Results:  Data Reviewed: I have personally reviewed following labs and imaging studies  CBC: Recent Labs  Lab 08/23/20 1759 08/24/20 0508 08/27/20 0432  WBC 9.7 8.8 8.3  HGB 8.3* 8.3* 8.3*  HCT 25.0* 25.3* 24.8*  MCV 85.6 85.5 85.2  PLT 257 241 [id number]    Basic Metabolic Panel: Recent Labs  Lab 08/23/20 1759 08/23/20 1956 08/24/20 0508 08/25/20 0530 08/26/20 0406 08/27/20 0432  NA 136  --  138 136 137 138  K 5.4*  --  4.9 6.1* 4.6 4.1  CL 108  --  111 108 105 104  CO2 20*  --  18* 18* 22 25  GLUCOSE 152*  --  97 158* 96 93  BUN 54*  --  56* 58* 47* 31*  CREATININE 8.52*  --  8.46* 8.62* 7.82* 6.05*  CALCIUM 6.1*  --  6.3* 6.8* 7.0* 7.4*  MG  --  1.8  --   --   --   --     GFR: Estimated Creatinine  Clearance: 15.1 mL/min (A) (by C-G formula based on SCr of 6.05 mg/dL (H)).  Liver Function Tests: Recent Labs  Lab 08/23/20 1956 08/24/20 0508  AST 14* 14*  ALT 10 9  ALKPHOS 100 82  BILITOT 0.3 0.5  PROT 6.0* 5.5*  ALBUMIN 2.7* 2.4*     CBG: Recent Labs  Lab 08/27/20 0758 08/27/20 1301 08/27/20 1545 08/27/20 2143 08/28/20 0749  GLUCAP 94 96 153* 117* 99     Recent Results (from the past 240 hour(s))  SARS CORONAVIRUS 2 (TAT 6-24 HRS) Nasopharyngeal Nasopharyngeal Swab     Status: None  Collection Time: 08/23/20  7:56 PM   Specimen: Nasopharyngeal Swab  Result Value Ref Range Status   SARS Coronavirus 2 NEGATIVE NEGATIVE Final    Comment: (NOTE) SARS-CoV-2 target nucleic acids are NOT DETECTED.  The SARS-CoV-2 RNA is generally detectable in upper and lower respiratory specimens during the acute phase of infection. Negative results do not preclude SARS-CoV-2 infection, do not rule out co-infections with other pathogens, and should not be used as the sole basis for treatment or other patient management decisions. Negative results must be combined with clinical observations, patient history, and epidemiological information. The expected result is Negative.  Fact Sheet for Patients: SugarRoll.be  Fact Sheet for Healthcare Providers: https://www.woods-mathews.com/  This test is not yet approved or cleared by the [place] FDA and  has been authorized for detection and/or diagnosis of SARS-CoV-2 by FDA under an Emergency Use Authorization (EUA). This EUA will remain  in effect (meaning this test can be used) for the duration of the COVID-19 declaration under Se ction 564(b)(1) of the Act, 21 U.S.C. section 360bbb-3(b)(1), unless the authorization is terminated or revoked sooner.  Performed at [facility], [place] [address], [place], [place] [address]       Radiology Studies: No results found.     LOS: 4  days   [name] [employer] on [email or web address]  08/28/2020, 10:19 AM

## 2020-08-28 NOTE — Progress Notes (Signed)
   08/27/20 0903 08/27/20 0915 08/27/20 0930  Vitals  Temp 99.1 F (37.3 C)  --   --   Temp Source Oral  --   --   BP (!) 152/86 (!) 144/81 (!) 160/85  MAP (mmHg) 105 99 107  BP Location Left Wrist  --   --   BP Method Automatic  --   --   Patient Position (if appropriate) Lying  --   --   Pulse Rate 71 73 79  Pulse Rate Source Monitor  --   --   ECG Heart Rate 72 74 79  Resp '18 17 17  '$ During Hemodialysis Assessment  Blood Flow Rate (mL/min) 300 mL/min 300 mL/min 300 mL/min  Arterial Pressure (mmHg) -110 mmHg -110 mmHg -110 mmHg  Venous Pressure (mmHg) 160 mmHg 160 mmHg 160 mmHg  Transmembrane Pressure (mmHg) 60 mmHg 60 mmHg 60 mmHg  Ultrafiltration Rate (mL/min) 170 mL/min 170 mL/min 170 mL/min  Dialysate Flow Rate (mL/min) 500 ml/min 500 ml/min 500 ml/min  Conductivity: Machine  '14 14 14  '$ HD Safety Checks Performed Yes Yes Yes  Dialysis Fluid Bolus Normal Saline  --   --   Bolus Amount (mL) 250 mL  --   --   Intra-Hemodialysis Comments Tx initiated Progressing as prescribed Progressing as prescribed

## 2020-08-28 NOTE — Progress Notes (Signed)
   08/26/20 1715 08/26/20 1730 08/26/20 1745  Vitals  BP (!) 179/92 (!) 179/91 (!) 173/91  MAP (mmHg) 117 116 115  Pulse Rate 73 78 74  ECG Heart Rate (!) 56 (!) 54 (!) 56  Resp '15 16 17  '$ During Hemodialysis Assessment  Blood Flow Rate (mL/min) 250 mL/min 250 mL/min 250 mL/min  Arterial Pressure (mmHg) -70 mmHg -70 mmHg -70 mmHg  Venous Pressure (mmHg) 110 mmHg 110 mmHg 110 mmHg  Transmembrane Pressure (mmHg) 60 mmHg 60 mmHg 60 mmHg  Ultrafiltration Rate (mL/min) 180 mL/min 180 mL/min 180 mL/min  Dialysate Flow Rate (mL/min) 500 ml/min 500 ml/min 500 ml/min  Conductivity: Machine  13.5 13.5 13.5  HD Safety Checks Performed Yes Yes Yes  Dialysis Fluid Bolus Normal Saline Normal Saline Normal Saline  Bolus Amount (mL)  --   --   --   Intra-Hemodialysis Comments Progressing as prescribed Progressing as prescribed Progressing as prescribed    08/26/20 1800 08/26/20 1815 08/26/20 1816  Vitals  BP (!) 168/89 (!) 173/95  --   MAP (mmHg) 112 118  --   Pulse Rate 77 77  --   ECG Heart Rate (!) 55 61  --   Resp 17 18  --   During Hemodialysis Assessment  Blood Flow Rate (mL/min) 250 mL/min 250 mL/min 200 mL/min  Arterial Pressure (mmHg) -70 mmHg -70 mmHg  --   Venous Pressure (mmHg) 110 mmHg 110 mmHg  --   Transmembrane Pressure (mmHg) 60 mmHg 60 mmHg  --   Ultrafiltration Rate (mL/min) 180 mL/min 180 mL/min  --   Dialysate Flow Rate (mL/min) 500 ml/min 500 ml/min  --   Conductivity: Machine  13.5 13.5  --   HD Safety Checks Performed Yes Yes  --   Dialysis Fluid Bolus Normal Saline Normal Saline Normal Saline  Bolus Amount (mL)  --   --  300 mL  Intra-Hemodialysis Comments Progressing as prescribed Progressing as prescribed Tx completed

## 2020-08-28 NOTE — Progress Notes (Signed)
   08/26/20 1545 08/26/20 1600 08/26/20 1615  Vitals  BP (!) 170/94 (!) 179/95 (!) 162/93  MAP (mmHg) 116 118 110  Pulse Rate  --  73 74  ECG Heart Rate (!) 39 (!) 49 (!) 56  Resp '20 19 19  '$ During Hemodialysis Assessment  Blood Flow Rate (mL/min) 250 mL/min 250 mL/min 250 mL/min  Arterial Pressure (mmHg) -70 mmHg -70 mmHg -70 mmHg  Venous Pressure (mmHg) 110 mmHg 110 mmHg 110 mmHg  Transmembrane Pressure (mmHg) 60 mmHg 60 mmHg 60 mmHg  Ultrafiltration Rate (mL/min) 180 mL/min 180 mL/min 180 mL/min  Dialysate Flow Rate (mL/min) 500 ml/min 500 ml/min 500 ml/min  Conductivity: Machine  13.5 13.5 13.5  HD Safety Checks Performed Yes Yes Yes  Dialysis Fluid Bolus Normal Saline Normal Saline Normal Saline  Bolus Amount (mL) 200 mL 0 mL  --   Intra-Hemodialysis Comments Tx initiated Progressing as prescribed Progressing as prescribed    08/26/20 1630 08/26/20 1645 08/26/20 1700  Vitals  BP (!) 170/94 (!) 165/91 (!) 160/87  MAP (mmHg) 114 114 106  Pulse Rate 73 73 74  ECG Heart Rate (!) 42 73 (!) 50  Resp '17 17 18  '$ During Hemodialysis Assessment  Blood Flow Rate (mL/min) 250 mL/min 250 mL/min 250 mL/min  Arterial Pressure (mmHg) -70 mmHg -70 mmHg -70 mmHg  Venous Pressure (mmHg) 110 mmHg 110 mmHg 110 mmHg  Transmembrane Pressure (mmHg) 60 mmHg 60 mmHg 60 mmHg  Ultrafiltration Rate (mL/min) 180 mL/min 180 mL/min 180 mL/min  Dialysate Flow Rate (mL/min) 500 ml/min 500 ml/min 500 ml/min  Conductivity: Machine  13.5 13.5 13.5  HD Safety Checks Performed Yes Yes Yes  Dialysis Fluid Bolus Normal Saline Normal Saline Normal Saline  Bolus Amount (mL)  --   --   --   Intra-Hemodialysis Comments Progressing as prescribed Progressing as prescribed Progressing as prescribed

## 2020-08-28 NOTE — Progress Notes (Signed)
   08/27/20 0945 08/27/20 1000 08/27/20 1015  Vitals  BP (!) 170/88 138/84 (!) 161/92  MAP (mmHg) 111 102 112  Pulse Rate 76 75 77  ECG Heart Rate 75 77 78  Resp '15 17 13  '$ During Hemodialysis Assessment  Blood Flow Rate (mL/min) 300 mL/min 300 mL/min 300 mL/min  Arterial Pressure (mmHg) -110 mmHg -110 mmHg -110 mmHg  Venous Pressure (mmHg) 160 mmHg 130 mmHg 130 mmHg  Transmembrane Pressure (mmHg) 60 mmHg 60 mmHg 60 mmHg  Ultrafiltration Rate (mL/min) 170 mL/min 170 mL/min 170 mL/min  Dialysate Flow Rate (mL/min) 500 ml/min 500 ml/min 500 ml/min  Conductivity: Machine  '14 14 14  '$ HD Safety Checks Performed Yes Yes Yes  Intra-Hemodialysis Comments Progressing as prescribed Progressing as prescribed Progressing as prescribed    08/27/20 1030  Vitals  BP (!) 157/80  MAP (mmHg) 103  Pulse Rate 72  ECG Heart Rate 76  Resp 16  During Hemodialysis Assessment  Blood Flow Rate (mL/min) 300 mL/min  Arterial Pressure (mmHg) -110 mmHg  Venous Pressure (mmHg) 130 mmHg  Transmembrane Pressure (mmHg) 60 mmHg  Ultrafiltration Rate (mL/min) 170 mL/min  Dialysate Flow Rate (mL/min) 500 ml/min  Conductivity: Machine  14  HD Safety Checks Performed Yes  Intra-Hemodialysis Comments Progressing as prescribed

## 2020-08-28 NOTE — Progress Notes (Signed)
   08/27/20 1045 08/27/20 1100 08/27/20 1115  Vitals  Temp  --   --   --   Temp Source  --   --   --   BP (!) 155/91 (!) 152/92 (!) 174/93  MAP (mmHg) 111 112 114  BP Location  --   --   --   BP Method  --   --   --   Patient Position (if appropriate)  --   --   --   Pulse Rate 77 76 75  Pulse Rate Source  --   --   --   ECG Heart Rate 78 77 75  Resp '17 15 16  '$ During Hemodialysis Assessment  Blood Flow Rate (mL/min) 300 mL/min 300 mL/min 300 mL/min  Arterial Pressure (mmHg) -110 mmHg -110 mmHg -110 mmHg  Venous Pressure (mmHg) 130 mmHg 130 mmHg 130 mmHg  Transmembrane Pressure (mmHg) 60 mmHg 60 mmHg 60 mmHg  Ultrafiltration Rate (mL/min) 170 mL/min 170 mL/min 170 mL/min  Dialysate Flow Rate (mL/min) 500 ml/min 500 ml/min 500 ml/min  Conductivity: Machine  '14 14 14  '$ HD Safety Checks Performed Yes Yes Yes  Intra-Hemodialysis Comments Progressing as prescribed Progressing as prescribed Progressing as prescribed    08/27/20 1130 08/27/20 1145 08/27/20 1200  Vitals  Temp  --   --   --   Temp Source  --   --   --   BP (!) 169/91 (!) 179/89 (!) 166/95  MAP (mmHg) 115 114 115  BP Location  --   --   --   BP Method  --   --   --   Patient Position (if appropriate)  --   --   --   Pulse Rate 77 74 76  Pulse Rate Source  --   --   --   ECG Heart Rate 76 76 78  Resp '14 18 15  '$ During Hemodialysis Assessment  Blood Flow Rate (mL/min) 300 mL/min 300 mL/min 300 mL/min  Arterial Pressure (mmHg) -110 mmHg -110 mmHg -110 mmHg  Venous Pressure (mmHg) 130 mmHg 130 mmHg 130 mmHg  Transmembrane Pressure (mmHg) 60 mmHg 60 mmHg 60 mmHg  Ultrafiltration Rate (mL/min) 170 mL/min 170 mL/min 170 mL/min  Dialysate Flow Rate (mL/min) 500 ml/min 500 ml/min 500 ml/min  Conductivity: Machine  '14 14 14  '$ HD Safety Checks Performed Yes Yes Yes  Intra-Hemodialysis Comments Progressing as prescribed Progressing as prescribed Progressing as prescribed    08/27/20 1215 08/27/20 1300  Vitals  Temp  --  99.1  F (37.3 C)  Temp Source  --  Oral  BP (!) 172/90 (!) 183/90  MAP (mmHg) 112 117  BP Location  --  Left Arm  BP Method  --  Automatic  Patient Position (if appropriate)  --  Lying  Pulse Rate 73 77  Pulse Rate Source  --  Monitor  ECG Heart Rate 75  --   Resp 17 14  During Hemodialysis Assessment  Blood Flow Rate (mL/min) 100 mL/min  --   Arterial Pressure (mmHg) -80 mmHg  --   Venous Pressure (mmHg) 70 mmHg  --   Transmembrane Pressure (mmHg) 30 mmHg  --   Ultrafiltration Rate (mL/min) 70 mL/min  --   Dialysate Flow Rate (mL/min) 500 ml/min  --   Conductivity: Machine  14  --   HD Safety Checks Performed Yes  --   Intra-Hemodialysis Comments Tx completed  --

## 2020-08-28 NOTE — Progress Notes (Signed)
[facility], [place] 08/28/20  Subjective:   LOS: [name] is a 45 y.o. female with past medical history of diabetes type 2, hypertension, CKD5, morbid obesity, anemia and metabolic acidosis. She presented to the ED with complaints of shortness of breath and was also found to have severe renal dysfunction. She is a patient at [facility] nephrology.   Patient is seen resting in bed  Alert and oriented Continues to complain of fatigue but says it's improving     Objective:  Vital signs in last 24 hours:  Temp:  [97.5 F (36.4 C)-99.6 F (37.6 C)] 99.6 F (37.6 C) (04/15 1147) Pulse Rate:  [71-79] 71 (04/15 1147) Resp:  [16-17] 16 (04/15 0750) BP: (131-179)/(70-89) 172/87 (04/15 1147) SpO2:  [93 %-97 %] 93 % (04/15 1147)  Weight change:  Filed Weights   08/23/20 1755 08/24/20 0519  Weight: 127.9 kg 131.4 kg    Intake/Output:    Intake/Output Summary (Last 24 hours) at 08/28/2020 1412 Last data filed at 08/28/2020 1300 Gross per 24 hour  Intake 720 ml  Output 250 ml  Net 470 ml     Physical Exam: General: No acute distress, fatigue  HEENT Normocephelic, moist oral membranes  Pulm/lungs Clear bilaterally, normal breathing effort  CVS/Heart S1S2 present, no rubs or gallops  Abdomen:  Soft, non tender  Extremities: min peripheral edema  Neurologic: Alert and oriented  Skin: No lesions or rashes  Access: Rt IJ permcath       Basic Metabolic Panel:  Recent Labs  Lab 08/23/20 1759 08/23/20 1956 08/24/20 0508 08/25/20 0530 08/26/20 0406 08/27/20 0432  NA 136  --  138 136 137 138  K 5.4*  --  4.9 6.1* 4.6 4.1  CL 108  --  111 108 105 104  CO2 20*  --  18* 18* 22 25  GLUCOSE 152*  --  97 158* 96 93  BUN 54*  --  56* 58* 47* 31*  CREATININE 8.52*  --  8.46* 8.62* 7.82* 6.05*  CALCIUM 6.1*  --  6.3* 6.8* 7.0* 7.4*  MG  --  1.8  --   --   --   --      CBC: Recent Labs  Lab 08/23/20 1759 08/24/20 0508 08/27/20 0432  WBC  9.7 8.8 8.3  HGB 8.3* 8.3* 8.3*  HCT 25.0* 25.3* 24.8*  MCV 85.6 85.5 85.2  PLT 257 241 231      Lab Results  Component Value Date   HEPBSAG NON REACTIVE 08/25/2020      Microbiology:  Recent Results (from the past 240 hour(s))  SARS CORONAVIRUS 2 (TAT 6-24 HRS) Nasopharyngeal Nasopharyngeal Swab     Status: None   Collection Time: 08/23/20  7:56 PM   Specimen: Nasopharyngeal Swab  Result Value Ref Range Status   SARS Coronavirus 2 NEGATIVE NEGATIVE Final    Comment: (NOTE) SARS-CoV-2 target nucleic acids are NOT DETECTED.  The SARS-CoV-2 RNA is generally detectable in upper and lower respiratory specimens during the acute phase of infection. Negative results do not preclude SARS-CoV-2 infection, do not rule out co-infections with other pathogens, and should not be used as the sole basis for treatment or other patient management decisions. Negative results must be combined with clinical observations, patient history, and epidemiological information. The expected result is Negative.  Fact Sheet for Patients: SugarRoll.be  Fact Sheet for Healthcare Providers: https://www.woods-mathews.com/  This test is not yet approved or cleared by the [place] FDA and  has been authorized for detection and/or diagnosis of SARS-CoV-2 by FDA under an Emergency Use Authorization (EUA). This EUA will remain  in effect (meaning this test can be used) for the duration of the COVID-19 declaration under Se ction 564(b)(1) of the Act, 21 U.S.C. section 360bbb-3(b)(1), unless the authorization is terminated or revoked sooner.  Performed at [facility], Time [address], [place], [place] [address]     Coagulation Studies: No results for input(s): LABPROT, INR in the last 72 hours.  Urinalysis: No results for input(s): COLORURINE, LABSPEC, PHURINE, GLUCOSEU, HGBUR, BILIRUBINUR, KETONESUR, PROTEINUR, UROBILINOGEN, NITRITE, LEUKOCYTESUR in  the last 72 hours.  Invalid input(s): APPERANCEUR    Imaging: No results found.   Medications:    . amLODipine  10 mg Oral Daily  . calcium carbonate  1 tablet Oral TID WC  . Chlorhexidine Gluconate Cloth  6 each Topical Daily  . [START ON 08/29/2020] epoetin (EPOGEN/PROCRIT) injection  4,000 Units Intravenous Q T,Th,Sa-HD  . heparin  5,000 Units Subcutaneous Q8H  . insulin aspart  0-6 Units Subcutaneous TID WC  . metoprolol succinate  50 mg Oral Daily  . polyethylene glycol  17 g Oral BID  . sertraline  50 mg Oral Daily   ipratropium-albuterol, ondansetron **OR** ondansetron (ZOFRAN) IV, oxyCODONE  Assessment/ Plan:  45 y.o. female with past medical history of diabetes type 2, hypertension, CKD5, morbid obesity, anemia and metabolic acidosis was admitted on 08/23/2020 for  Principal Problem:   AKI (acute kidney injury) ([place]) Active Problems:   Obesity, Class III, BMI 40-49.9 (morbid obesity) ([place])   Diabetes ([place])   Hypertensive urgency   ESRD (end stage renal disease) ([place])   Acute renal failure superimposed on stage 3 chronic kidney disease, unspecified acute renal failure type, unspecified whether stage 3a or 3b CKD ([facility])  Hypocalcemia [E83.51] AKI (acute kidney injury) ([place]) [N17.9] Acute renal failure superimposed on stage 3 chronic kidney disease, unspecified acute renal failure type, unspecified whether stage 3a or 3b CKD ([place]) [N17.9, N18.30]  #. ESRD on dialysis Progressive renal dysfunction  Rt IJ permcath placed 08/24/20 Received third treatment yesterday Tolerated well Next treatment will be in chair to prepare for outpatient center Dialysis liaison discharge planning underway  #. Anemia of CKD  Lab Results  Component Value Date   HGB 8.3 (L) 08/27/2020   EPO with dialysis Avoid blood products in new dialysis pateints, if possible  #. Secondary hyperparathyroidism of renal origin N 25.81   No results found for: PTH No results found for: PHOS   Calcium Carbonate with meals TID Monitor calcium and phos level during this admission   #. Diabetes type 2 with CKD Hgb A1c MFr Bld (%)  Date Value  08/23/2020 5.8 (H)  Glucose managed stay Primary team managing SSi  # Hyperkalemia resolved    LOS: [place] 4/15/20222:12 [place], [place]

## 2020-08-29 LAB — CBC
HCT: 22.7 % — ABNORMAL LOW (ref 36.0–46.0)
Hemoglobin: 7.4 g/dL — ABNORMAL LOW (ref 12.0–15.0)
MCH: 28 pg (ref 26.0–34.0)
MCHC: 32.6 g/dL (ref 30.0–36.0)
MCV: 86 fL (ref 80.0–100.0)
Platelets: 226 10*3/uL (ref 150–400)
RBC: 2.64 MIL/uL — ABNORMAL LOW (ref 3.87–5.11)
RDW: 13.5 % (ref 11.5–15.5)
WBC: 8.2 10*3/uL (ref 4.0–10.5)
nRBC: 0 % (ref 0.0–0.2)

## 2020-08-29 LAB — BASIC METABOLIC PANEL
Anion gap: 8 (ref 5–15)
BUN: 31 mg/dL — ABNORMAL HIGH (ref 6–20)
CO2: 26 mmol/L (ref 22–32)
Calcium: 7.4 mg/dL — ABNORMAL LOW (ref 8.9–10.3)
Chloride: 102 mmol/L (ref 98–111)
Creatinine, Ser: 6.91 mg/dL — ABNORMAL HIGH (ref 0.44–1.00)
GFR, Estimated: 7 mL/min — ABNORMAL LOW (ref >60)
Glucose, Bld: 117 mg/dL — ABNORMAL HIGH (ref 70–99)
Potassium: 4 mmol/L (ref 3.5–5.1)
Sodium: 136 mmol/L (ref 135–145)

## 2020-08-29 LAB — TSH: TSH: 3.921 u[IU]/mL (ref 0.350–4.500)

## 2020-08-29 LAB — GLUCOSE, CAPILLARY
Glucose-Capillary: 105 mg/dL — ABNORMAL HIGH (ref 70–99)
Glucose-Capillary: 166 mg/dL — ABNORMAL HIGH (ref 70–99)
Glucose-Capillary: 224 mg/dL — ABNORMAL HIGH (ref 70–99)

## 2020-08-29 MED ORDER — FLEET ENEMA 7-19 GM/118ML RE ENEM: 1.0000 | ENEMA | Freq: Every day | RECTAL | Status: DC | PRN

## 2020-08-29 MED ORDER — HEPARIN SODIUM (PORCINE) 1000 UNIT/ML IJ SOLN
4600.0000 [IU] | Freq: Once | INTRAMUSCULAR | Status: AC
  Administered 2020-08-29: 4600 [IU]

## 2020-08-29 MED ORDER — BISACODYL 10 MG RE SUPP
10.0000 mg | Freq: Every day | RECTAL | Status: DC | PRN
  Administered 2020-08-29: 10 mg via RECTAL
  Filled 2020-08-29 (×2): qty 1

## 2020-08-29 NOTE — Progress Notes (Signed)
[facility] PROGRESS NOTE   [name] [id number] DOB: 11-29-1975 DOA: 08/23/2020  PCP: Patient, No Pcp Per (Inactive)  Brief History/Interval Summary: 45 y.o.femalewith medical history significant forhypertension, previously insulin-dependent diabetes mellitus, CKD 4, morbid obesity, metabolic acidosis secondary to poor renal function, history of recurrent urinary tract infection, presents to the emergency department for chief concerns of shortness of breath.  Noted to have worsening of her renal function.  Was seen by nephrology and was started on hemodialysis.  Consultants: Nephrology  Procedures: Dialysis catheter placement.  Hemodialysis  Antibiotics: Anti-infectives (From admission, onward)   Start     Dose/Rate Route Frequency Ordered Stop   08/25/20 0000  ceFAZolin (ANCEF) IVPB 2g/100 mL premix  Status:  Discontinued       Note to Pharmacy: To be given in specials   2 g 200 mL/hr over 30 Minutes Intravenous  Once 08/24/20 1444 08/24/20 1850   08/24/20 1436  ceFAZolin (ANCEF) 1-4 GM/50ML-% IVPB       Note to Pharmacy: Maynor, Erin   : cabinet override      08/24/20 1436 08/25/20 0244      Subjective/Interval History: Patient denies any complaints this morning specifically no shortness of breath or nausea or vomiting.     Assessment/Plan:  AKI on CKD4--progressed to ESRD Patient with progression of her kidney disease to ESRD.  Nephrology is following.  Patient underwent dialysis access placement.  Started on dialysis on 4/12.  Dialyzed again on 4/13 and 4/14.  Management per nephrology.  Will need to be established with outpatient dialysis center prior to discharge.  Constipation Continue MiraLAX twice a day.  TSH is normal.   Hypocalcemia Possibly secondary to CKD.  Management per nephrology.  Looks like she was started on calcium carbonate.  Corrected calcium is 8.6.  Anemia of chronic disease Baseline hemoglobin is around 10-11.  Hemoglobin  has been slowly trending downwards.  Patient has been started on Epogen.  No overt bleeding noted.    Hyperkalemia Resolved  Renal hypertension Blood pressure was poorly controlled. Noted to be on amlodipine and metoprolol.  Dose of metoprolol was increased due to poorly controlled blood pressure.  Will not be too aggressive since she is on hemodialysis.  Pressure seems to be better controlled.  Continue to monitor.  Type II diabetes, insulin-dependent with CKD stage V  Patient with history of noncompliance.  Currently on SSI.  HbA1c was 5.8 surprisingly. Due to low CBGs she has been off of Lantus.  Low requirements likely due to progression of kidney disease  Morbid obesity Estimated body mass index is 54.72 kg/m as calculated from the following:   Height as of this encounter: '5\' 1"'$  (1.549 m).   Weight as of this encounter: 131.4 kg.    DVT Prophylaxis: Subcutaneous heparin Code Status: Full code Family Communication: Discussed with the patient Disposition Plan:   Hopefully return home when outpatient dialysis had been established.  Status is: Inpatient  Remains inpatient appropriate because:IV treatments appropriate due to intensity of illness or inability to take PO and Inpatient level of care appropriate due to severity of illness   Dispo: The patient is from: Home              Anticipated d/c is to: Home              Patient currently is not medically stable to d/c.   Difficult to place patient No     Medications:  Scheduled: . amLODipine  10 mg  Oral Daily  . calcium carbonate  1 tablet Oral TID WC  . Chlorhexidine Gluconate Cloth  6 each Topical Daily  . epoetin (EPOGEN/PROCRIT) injection  4,000 Units Intravenous Q T,Th,Sa-HD  . heparin  5,000 Units Subcutaneous Q8H  . insulin aspart  0-6 Units Subcutaneous TID WC  . metoprolol succinate  50 mg Oral Daily  . polyethylene glycol  17 g Oral BID  . sertraline  50 mg Oral Daily    Continuous:  [id number], ondansetron **OR** ondansetron (ZOFRAN) IV, oxyCODONE   Objective:  Vital Signs  Vitals:   08/28/20 2345 08/29/20 0516 08/29/20 0744 08/29/20 1020  BP: (!) 172/95 (!) 156/87 140/83 (!) 164/87  Pulse: 70 69 66 65  Resp: '18 19 18 '$ (!) 23  Temp: 98.2 F (36.8 C) 98.6 F (37 C) 98.3 F (36.8 C) 98.4 F (36.9 C)  TempSrc: Oral Oral Oral Oral  SpO2: 96% 94% 94% 97%  Weight:      Height:        Intake/Output Summary (Last 24 hours) at 08/29/2020 1030 Last data filed at 08/29/2020 0515 Gross per 24 hour  Intake 240 ml  Output 450 ml  Net -210 ml   Filed Weights   08/23/20 1755 08/24/20 0519  Weight: 127.9 kg 131.4 kg    General appearance: Awake alert.  In no distress Resp: Clear to auscultation bilaterally.  Normal effort Cardio: S1-S2 is normal regular.  No S3-S4.  No rubs murmurs or bruit GI: Abdomen is soft.  Nontender nondistended.  Bowel sounds are present normal.  No masses organomegaly Extremities: No edema.  Full range of motion of lower extremities. Neurologic: Alert and oriented x3.  No focal neurological deficits.     Lab Results:  Data Reviewed: I have personally reviewed following labs and imaging studies  CBC: Recent Labs  Lab 08/23/20 1759 08/24/20 0508 08/27/20 0432 08/29/20 0425  WBC 9.7 8.8 8.3 8.2  HGB 8.3* 8.3* 8.3* 7.4*  HCT 25.0* 25.3* 24.8* 22.7*  MCV 85.6 85.5 85.2 86.0  PLT 257 241 231 [id number]    Basic Metabolic Panel: Recent Labs  Lab 08/23/20 1956 08/24/20 0508 08/25/20 0530 08/26/20 0406 08/27/20 0432 08/29/20 0425  NA  --  138 136 137 138 136  K  --  4.9 6.1* 4.6 4.1 4.0  CL  --  111 108 105 104 102  CO2  --  18* 18* '22 25 26  '$ GLUCOSE  --  97 158* 96 93 117*  BUN  --  56* 58* 47* 31* 31*  CREATININE  --  8.46* 8.62* 7.82* 6.05* 6.91*  CALCIUM  --  6.3* 6.8* 7.0* 7.4* 7.4*  MG 1.8  --   --   --   --   --     GFR: Estimated Creatinine Clearance: 13.2 mL/min (A) (by C-G formula  based on SCr of 6.91 mg/dL (H)).  Liver Function Tests: Recent Labs  Lab 08/23/20 1956 08/24/20 0508  AST 14* 14*  ALT 10 9  ALKPHOS 100 82  BILITOT 0.3 0.5  PROT 6.0* 5.5*  ALBUMIN 2.7* 2.4*     CBG: Recent Labs  Lab 08/28/20 0749 08/28/20 1212 08/28/20 1640 08/28/20 2158 08/29/20 0731  GLUCAP 99 176* 137* 144* 105*     Recent Results (from the past 240 hour(s))  SARS CORONAVIRUS 2 (TAT 6-24 HRS) Nasopharyngeal Nasopharyngeal Swab     Status: None   Collection Time: 08/23/20  7:56 PM   Specimen: Nasopharyngeal Swab  Result Value Ref  Range Status   SARS Coronavirus 2 NEGATIVE NEGATIVE Final    Comment: (NOTE) SARS-CoV-2 target nucleic acids are NOT DETECTED.  The SARS-CoV-2 RNA is generally detectable in upper and lower respiratory specimens during the acute phase of infection. Negative results do not preclude SARS-CoV-2 infection, do not rule out co-infections with other pathogens, and should not be used as the sole basis for treatment or other patient management decisions. Negative results must be combined with clinical observations, patient history, and epidemiological information. The expected result is Negative.  Fact Sheet for Patients: SugarRoll.be  Fact Sheet for Healthcare Providers: https://www.woods-mathews.com/  This test is not yet approved or cleared by the [place] FDA and  has been authorized for detection and/or diagnosis of SARS-CoV-2 by FDA under an Emergency Use Authorization (EUA). This EUA will remain  in effect (meaning this test can be used) for the duration of the COVID-19 declaration under Se ction 564(b)(1) of the Act, 21 U.S.C. section 360bbb-3(b)(1), unless the authorization is terminated or revoked sooner.  Performed at [facility], [place] [address] [place], [place] [address]       Radiology Studies: No results found.     LOS: 5 days   Mariaelena Cade [employer] on [email or web address]  08/29/2020, 10:30 AM

## 2020-08-29 NOTE — Progress Notes (Signed)
[facility]  ROUNDING NOTE   Subjective:   Patient was seen on dialysis. Reporting cramping in her hand during treatment today.  Denies any shortness of breath, edema. Reports she is eating well.    Objective:  Vital signs in last 24 hours:  Temp:  [98 F (36.7 C)-98.6 F (37 C)] 98.4 F (36.9 C) (04/16 1020) Pulse Rate:  [62-70] 66 (04/16 1145) Resp:  [14-23] 16 (04/16 1145) BP: (140-172)/(74-95) 156/90 (04/16 1145) SpO2:  [94 %-98 %] 98 % (04/16 1145)  Weight change:  Filed Weights   08/23/20 1755 08/24/20 0519  Weight: 127.9 kg 131.4 kg    Intake/Output: I/O last 3 completed shifts: In: 600 [P.O.:600] Out: 700 [Urine:700]   Intake/Output this shift:  No intake/output data recorded.  Physical Exam: General: NAD, sitting up in chair   Head: Normocephalic, atraumatic. Moist oral mucosal membranes  Eyes: Anicteric, PERRL  Neck: Supple, trachea midline  Lungs:  Clear to auscultation  Heart: Regular rate and rhythm  Abdomen:  Soft, nontender,   Extremities:  No peripheral edema.  Neurologic: Nonfocal, moving all four extremities  Skin: No lesions  Access: Right IJ perm Cath     Basic Metabolic Panel: Recent Labs  Lab 08/23/20 1956 08/24/20 0508 08/25/20 0530 08/26/20 0406 08/27/20 0432 08/29/20 0425  NA  --  138 136 137 138 136  K  --  4.9 6.1* 4.6 4.1 4.0  CL  --  111 108 105 104 102  CO2  --  18* 18* '22 25 26  '$ GLUCOSE  --  97 158* 96 93 117*  BUN  --  56* 58* 47* 31* 31*  CREATININE  --  8.46* 8.62* 7.82* 6.05* 6.91*  CALCIUM  --  6.3* 6.8* 7.0* 7.4* 7.4*  MG 1.8  --   --   --   --   --     Liver Function Tests: Recent Labs  Lab 08/23/20 1956 08/24/20 0508  AST 14* 14*  ALT 10 9  ALKPHOS 100 82  BILITOT 0.3 0.5  PROT 6.0* 5.5*  ALBUMIN 2.7* 2.4*   No results for input(s): LIPASE, AMYLASE in the last 168 hours. No results for input(s): AMMONIA in the last 168 hours.  CBC: Recent Labs  Lab 08/23/20 1759 08/24/20 0508  08/27/20 0432 08/29/20 0425  WBC 9.7 8.8 8.3 8.2  HGB 8.3* 8.3* 8.3* 7.4*  HCT 25.0* 25.3* 24.8* 22.7*  MCV 85.6 85.5 85.2 86.0  PLT 257 241 231 226    Cardiac Enzymes: No results for input(s): CKTOTAL, CKMB, CKMBINDEX, TROPONINI in the last 168 hours.  BNP: Invalid input(s): POCBNP  CBG: Recent Labs  Lab 08/28/20 0749 08/28/20 1212 08/28/20 1640 08/28/20 2158 08/29/20 0731  GLUCAP 99 176* 137* 144* 105*    Microbiology: Results for orders placed or performed during the hospital encounter of 08/23/20  SARS CORONAVIRUS 2 (TAT 6-24 HRS) Nasopharyngeal Nasopharyngeal Swab     Status: None   Collection Time: 08/23/20  7:56 PM   Specimen: Nasopharyngeal Swab  Result Value Ref Range Status   SARS Coronavirus 2 NEGATIVE NEGATIVE Final    Comment: (NOTE) SARS-CoV-2 target nucleic acids are NOT DETECTED.  The SARS-CoV-2 RNA is generally detectable in upper and lower respiratory specimens during the acute phase of infection. Negative results do not preclude SARS-CoV-2 infection, do not rule out co-infections with other pathogens, and should not be used as the sole basis for treatment or other patient management decisions. Negative results must be combined  with clinical observations, patient history, and epidemiological information. The expected result is Negative.  Fact Sheet for Patients: SugarRoll.be  Fact Sheet for Healthcare Providers: https://www.woods-mathews.com/  This test is not yet approved or cleared by the [place] FDA and  has been authorized for detection and/or diagnosis of SARS-CoV-2 by FDA under an Emergency Use Authorization (EUA). This EUA will remain  in effect (meaning this test can be used) for the duration of the COVID-19 declaration under Se ction 564(b)(1) of the Act, 21 U.S.C. section 360bbb-3(b)(1), unless the authorization is terminated or revoked sooner.  Performed at [facility],  [place] [address] [place], [place] [address]     Coagulation Studies: No results for input(s): LABPROT, INR in the last 72 hours.  Urinalysis: No results for input(s): COLORURINE, LABSPEC, PHURINE, GLUCOSEU, HGBUR, BILIRUBINUR, KETONESUR, PROTEINUR, UROBILINOGEN, NITRITE, LEUKOCYTESUR in the last 72 hours.  Invalid input(s): APPERANCEUR    Imaging: No results found.   Medications:    . amLODipine  10 mg Oral Daily  . calcium carbonate  1 tablet Oral TID WC  . Chlorhexidine Gluconate Cloth  6 each Topical Daily  . epoetin (EPOGEN/PROCRIT) injection  4,000 Units Intravenous Q T,Th,Sa-HD  . heparin  5,000 Units Subcutaneous Q8H  . heparin sodium (porcine)  4,600 Units Intracatheter Once  . insulin aspart  0-6 Units Subcutaneous TID WC  . metoprolol succinate  50 mg Oral Daily  . polyethylene glycol  17 g Oral BID  . sertraline  50 mg Oral Daily   bisacodyl, ipratropium-albuterol, ondansetron **OR** ondansetron (ZOFRAN) IV, oxyCODONE, sodium phosphate  Assessment/ Plan:  [name] is a 45 y.o.  female  with past medical history of diabetes type 2, hypertension, CKD5, morbid obesity, anemia and metabolic acidosis. She presented to the ED with complaints of shortness of breath and was admitted for AKI. She is a patient at [facility] nephrology.   1. ESRD on dialysis -Rt IJ permcath placed 08/24/20 - dialysis today while sitting in a chair.  -Dialysis liaison discharge planning underway  2. Anemia of CKD  - EPO with dialysis  - hgb 7.4  3. Secondary hyperparathyroidism  - on calcium carbonate three times daily with meals   4. Diabetes type 2 with CKD  - manage per primary team   5. Hyperkalemia  - resolved.    LOS: [place] 4/16/202212:34 PM

## 2020-08-29 NOTE — Progress Notes (Signed)
Patient has verbalized that she does NOT want husband to know she is here and does not want husband or daughter to have any information about her at this time. [name]

## 2020-08-30 DIAGNOSIS — D638 Anemia in other chronic diseases classified elsewhere: Secondary | ICD-10-CM

## 2020-08-30 DIAGNOSIS — I151 Hypertension secondary to other renal disorders: Secondary | ICD-10-CM

## 2020-08-30 DIAGNOSIS — N2889 Other specified disorders of kidney and ureter: Secondary | ICD-10-CM

## 2020-08-30 LAB — CBC
HCT: 25.3 % — ABNORMAL LOW (ref 36.0–46.0)
Hemoglobin: 8.3 g/dL — ABNORMAL LOW (ref 12.0–15.0)
MCH: 28.3 pg (ref 26.0–34.0)
MCHC: 32.8 g/dL (ref 30.0–36.0)
MCV: 86.3 fL (ref 80.0–100.0)
Platelets: 218 10*3/uL (ref 150–400)
RBC: 2.93 MIL/uL — ABNORMAL LOW (ref 3.87–5.11)
RDW: 13.5 % (ref 11.5–15.5)
WBC: 8.9 10*3/uL (ref 4.0–10.5)
nRBC: 0 % (ref 0.0–0.2)

## 2020-08-30 LAB — GLUCOSE, CAPILLARY
Glucose-Capillary: 104 mg/dL — ABNORMAL HIGH (ref 70–99)
Glucose-Capillary: 192 mg/dL — ABNORMAL HIGH (ref 70–99)
Glucose-Capillary: 184 mg/dL — ABNORMAL HIGH (ref 70–99)
Glucose-Capillary: 160 mg/dL — ABNORMAL HIGH (ref 70–99)

## 2020-08-30 LAB — HEPATITIS B DNA, ULTRAQUANTITATIVE, PCR
HBV DNA SERPL PCR-ACNC: NOT DETECTED IU/mL
HBV DNA SERPL PCR-LOG IU: UNDETERMINED log10 IU/mL

## 2020-08-30 NOTE — Plan of Care (Signed)
?  Problem: Clinical Measurements: ?Goal: Ability to maintain clinical measurements within normal limits will improve ?Outcome: Progressing ?  ?Problem: Clinical Measurements: ?Goal: Will remain free from infection ?Outcome: Progressing ?  ?Problem: Clinical Measurements: ?Goal: Respiratory complications will improve ?Outcome: Progressing ?  ?

## 2020-08-30 NOTE — Progress Notes (Signed)
Patient has requested a Chaplain visit. Patient has requested particular things that leads me to believe she is of [place]. The request are for " holy water" and prayer. I will reach out to [facility] Father Evette Doffing who is the [place] speaking priest.  I will follow up to see if the request has been followed through.

## 2020-08-30 NOTE — Plan of Care (Signed)

## 2020-08-30 NOTE — Progress Notes (Signed)
[facility]  ROUNDING NOTE   Subjective:   Reporting cramping in her hand during treatment yesterday, otherwise she reports dialysis went well.  Denies any shortness of breath, edema. Reports she does not have as much of an appetite but is trying to continue to eat.  She feels that her dizziness has improved.  Denies any nausea, vomiting, diarrhea.    Objective:  Vital signs in last 24 hours:  Temp:  [98 F (36.7 C)-99.2 F (37.3 C)] 98.1 F (36.7 C) (04/17 0741) Pulse Rate:  [62-75] 62 (04/17 0741) Resp:  [12-20] 16 (04/17 0741) BP: (148-191)/(82-106) 172/82 (04/17 0741) SpO2:  [94 %-100 %] 95 % (04/17 0741)  Weight change:  Filed Weights   08/23/20 1755 08/24/20 0519  Weight: 127.9 kg 131.4 kg    Intake/Output: I/O last 3 completed shifts: In: -  Out: 730 [Urine:500; Other:230]   Intake/Output this shift:  No intake/output data recorded.  Physical Exam: General: NAD, sitting up in chair   Head: Normocephalic, atraumatic. Moist oral mucosal membranes  Eyes: Anicteric, PERRL  Neck: Supple, trachea midline  Lungs:  Clear to auscultation  Heart: Regular rate and rhythm  Abdomen:  Soft, nontender,   Extremities:  No peripheral edema.  Neurologic: Nonfocal, moving all four extremities  Skin: No lesions  Access: Right IJ perm Cath     Basic Metabolic Panel: Recent Labs  Lab 08/23/20 1956 08/24/20 0508 08/25/20 0530 08/26/20 0406 08/27/20 0432 08/29/20 0425  NA  --  138 136 137 138 136  K  --  4.9 6.1* 4.6 4.1 4.0  CL  --  111 108 105 104 102  CO2  --  18* 18* '22 25 26  '$ GLUCOSE  --  97 158* 96 93 117*  BUN  --  56* 58* 47* 31* 31*  CREATININE  --  8.46* 8.62* 7.82* 6.05* 6.91*  CALCIUM  --  6.3* 6.8* 7.0* 7.4* 7.4*  MG 1.8  --   --   --   --   --     Liver Function Tests: Recent Labs  Lab 08/23/20 1956 08/24/20 0508  AST 14* 14*  ALT 10 9  ALKPHOS 100 82  BILITOT 0.3 0.5  PROT 6.0* 5.5*  ALBUMIN 2.7* 2.4*   No results for  input(s): LIPASE, AMYLASE in the last 168 hours. No results for input(s): AMMONIA in the last 168 hours.  CBC: Recent Labs  Lab 08/23/20 1759 08/24/20 0508 08/27/20 0432 08/29/20 0425 08/30/20 0709  WBC 9.7 8.8 8.3 8.2 8.9  HGB 8.3* 8.3* 8.3* 7.4* 8.3*  HCT 25.0* 25.3* 24.8* 22.7* 25.3*  MCV 85.6 85.5 85.2 86.0 86.3  PLT 257 241 231 226 218    Cardiac Enzymes: No results for input(s): CKTOTAL, CKMB, CKMBINDEX, TROPONINI in the last 168 hours.  BNP: Invalid input(s): POCBNP  CBG: Recent Labs  Lab 08/28/20 2158 08/29/20 0731 08/29/20 1706 08/29/20 2104 08/30/20 0743  GLUCAP 144* 105* 166* 224* 104*    Microbiology: Results for orders placed or performed during the hospital encounter of 08/23/20  SARS CORONAVIRUS 2 (TAT 6-24 HRS) Nasopharyngeal Nasopharyngeal Swab     Status: None   Collection Time: 08/23/20  7:56 PM   Specimen: Nasopharyngeal Swab  Result Value Ref Range Status   SARS Coronavirus 2 NEGATIVE NEGATIVE Final    Comment: (NOTE) SARS-CoV-2 target nucleic acids are NOT DETECTED.  The SARS-CoV-2 RNA is generally detectable in upper and lower respiratory specimens during the acute phase of infection. Negative  results do not preclude SARS-CoV-2 infection, do not rule out co-infections with other pathogens, and should not be used as the sole basis for treatment or other patient management decisions. Negative results must be combined with clinical observations, patient history, and epidemiological information. The expected result is Negative.  Fact Sheet for Patients: SugarRoll.be  Fact Sheet for Healthcare Providers: https://www.woods-mathews.com/  This test is not yet approved or cleared by the [place] FDA and  has been authorized for detection and/or diagnosis of SARS-CoV-2 by FDA under an Emergency Use Authorization (EUA). This EUA will remain  in effect (meaning this test can be used) for the  duration of the COVID-19 declaration under Se ction 564(b)(1) of the Act, 21 U.S.C. section 360bbb-3(b)(1), unless the authorization is terminated or revoked sooner.  Performed at [facility], [place] [address] [place], [place] [address]     Coagulation Studies: No results for input(s): LABPROT, INR in the last 72 hours.  Urinalysis: No results for input(s): COLORURINE, LABSPEC, PHURINE, GLUCOSEU, HGBUR, BILIRUBINUR, KETONESUR, PROTEINUR, UROBILINOGEN, NITRITE, LEUKOCYTESUR in the last 72 hours.  Invalid input(s): APPERANCEUR    Imaging: No results found.   Medications:    . amLODipine  10 mg Oral Daily  . calcium carbonate  1 tablet Oral TID WC  . Chlorhexidine Gluconate Cloth  6 each Topical Daily  . epoetin (EPOGEN/PROCRIT) injection  4,000 Units Intravenous Q T,Th,Sa-HD  . heparin  5,000 Units Subcutaneous Q8H  . insulin aspart  0-6 Units Subcutaneous TID WC  . metoprolol succinate  50 mg Oral Daily  . polyethylene glycol  17 g Oral BID  . sertraline  50 mg Oral Daily   bisacodyl, ipratropium-albuterol, ondansetron **OR** ondansetron (ZOFRAN) IV, oxyCODONE, sodium phosphate  Assessment/ Plan:  [name] is a 45 y.o.  female  with past medical history of diabetes type 2, hypertension, CKD5, morbid obesity, anemia and metabolic acidosis. She presented to the ED with complaints of shortness of breath and was admitted for AKI. She is a patient at [facility] nephrology.   1. ESRD on dialysis -Rt IJ permcath placed 08/24/20 - dialyzed yesterday sitting in a chair.  - plan for dialysis on TTS schedule -Dialysis liaison discharge planning underway - no indication for dialysis today.   2. Anemia of CKD  - EPO with dialysis  - hgb 8.3  3. Secondary hyperparathyroidism  - on calcium carbonate three times daily with meals   4. Diabetes type 2 with CKD  - manage per primary team   5. Hyperkalemia  - resolved.    LOS: 6 [name] P [name] Basic 4/17/202210:55  AM

## 2020-08-30 NOTE — Progress Notes (Signed)
[facility] PROGRESS NOTE   [name] [id number] DOB: 1975/12/25 DOA: 08/23/2020  PCP: Patient, No Pcp Per (Inactive)  Brief History/Interval Summary: 45 y.o.femalewith medical history significant forhypertension, previously insulin-dependent diabetes mellitus, CKD 4, morbid obesity, metabolic acidosis secondary to poor renal function, history of recurrent urinary tract infection, presents to the emergency department for chief concerns of shortness of breath.  Noted to have worsening of her renal function.  Was seen by nephrology and was started on hemodialysis.  Consultants: Nephrology  Procedures: Dialysis catheter placement.  Hemodialysis  Antibiotics: Anti-infectives (From admission, onward)   Start     Dose/Rate Route Frequency Ordered Stop   08/25/20 0000  ceFAZolin (ANCEF) IVPB 2g/100 mL premix  Status:  Discontinued       Note to Pharmacy: To be given in specials   2 g 200 mL/hr over 30 Minutes Intravenous  Once 08/24/20 1444 08/24/20 1850   08/24/20 1436  ceFAZolin (ANCEF) 1-4 GM/50ML-% IVPB       Note to Pharmacy: Maynor, Erin   : cabinet override      08/24/20 1436 08/25/20 0244      Subjective/Interval History: Patient states that she is feeling well.  Denies any shortness of breath nausea or vomiting.  She finally had a bowel movement yesterday.     Assessment/Plan:  AKI on CKD4--progressed to ESRD Patient with progression of her kidney disease to ESRD.  Nephrology is following.  Patient underwent dialysis access placement.  Started on dialysis on 4/12.  Dialyzed again on 4/13, 4/14, 4/16.  Management per nephrology.  Will need to be established with outpatient dialysis center prior to discharge.  Constipation Continue MiraLAX twice a day.  TSH is normal.  Had a bowel movement 4/16.  Hypocalcemia Possibly secondary to CKD.  Management per nephrology.  Looks like she was started on calcium carbonate.  Corrected calcium is 8.6.  Anemia of  chronic disease Baseline hemoglobin is around 10-11.  Hemoglobin has been slowly trending downwards.  Patient was started on Epogen. Nursing staff is reporting that her urine is bloody.  Will order UA.  Hyperkalemia Resolved  Renal hypertension Blood pressure was poorly controlled. Noted to be on amlodipine and metoprolol.  Dose of metoprolol was increased due to poorly controlled blood pressure.  Blood pressure remains poorly controlled.  Discussed with nephrology today.  They mentioned that they might initiate alternative antihypertensives.  Will defer to them at this time.   Type II diabetes, insulin-dependent with CKD stage V  Patient with history of noncompliance.  Currently on SSI.  HbA1c was 5.8 surprisingly. Due to low CBGs she has been off of Lantus.  Low requirements likely due to progression of kidney disease  Morbid obesity Estimated body mass index is 54.72 kg/m as calculated from the following:   Height as of this encounter: '5\' 1"'$  (1.549 m).   Weight as of this encounter: 131.4 kg.    DVT Prophylaxis: Subcutaneous heparin Code Status: Full code Family Communication: Discussed with the patient Disposition Plan:   Hopefully return home when outpatient dialysis had been established.  Status is: Inpatient  Remains inpatient appropriate because:IV treatments appropriate due to intensity of illness or inability to take PO and Inpatient level of care appropriate due to severity of illness   Dispo: The patient is from: Home              Anticipated d/c is to: Home              Patient  currently is not medically stable to d/c.   Difficult to place patient No     Medications:  Scheduled: . amLODipine  10 mg Oral Daily  . calcium carbonate  1 tablet Oral TID WC  . Chlorhexidine Gluconate Cloth  6 each Topical Daily  . epoetin (EPOGEN/PROCRIT) injection  4,000 Units Intravenous Q T,Th,Sa-HD  . heparin  5,000 Units Subcutaneous Q8H  . insulin aspart  0-6 Units  Subcutaneous TID WC  . metoprolol succinate  50 mg Oral Daily  . polyethylene glycol  17 g Oral BID  . sertraline  50 mg Oral Daily   Continuous:  [id number], ipratropium-albuterol, ondansetron **OR** ondansetron (ZOFRAN) IV, oxyCODONE, sodium phosphate   Objective:  Vital Signs  Vitals:   08/29/20 2022 08/30/20 0023 08/30/20 0503 08/30/20 0741  BP: (!) 160/89 (!) 156/88 (!) 148/91 (!) 172/82  Pulse: 73 75 66 62  Resp: '18 18  16  '$ Temp: 99.2 F (37.3 C)  98.2 F (36.8 C) 98.1 F (36.7 C)  TempSrc: Oral  Oral   SpO2: 97% 98% 94% 95%  Weight:      Height:        Intake/Output Summary (Last 24 hours) at 08/30/2020 1041 Last data filed at 08/30/2020 0500 Gross per 24 hour  Intake --  Output 530 ml  Net -530 ml   Filed Weights   08/23/20 1755 08/24/20 0519  Weight: 127.9 kg 131.4 kg    General appearance: Awake alert.  In no distress Resp: Clear to auscultation bilaterally.  Normal effort Cardio: S1-S2 is normal regular.  No S3-S4.  No rubs murmurs or bruit GI: Abdomen is soft.  Nontender nondistended.  Bowel sounds are present normal.  No masses organomegaly    Lab Results:  Data Reviewed: I have personally reviewed following labs and imaging studies  CBC: Recent Labs  Lab 08/23/20 1759 08/24/20 0508 08/27/20 0432 08/29/20 0425 08/30/20 0709  WBC 9.7 8.8 8.3 8.2 8.9  HGB 8.3* 8.3* 8.3* 7.4* 8.3*  HCT 25.0* 25.3* 24.8* 22.7* 25.3*  MCV 85.6 85.5 85.2 86.0 86.3  PLT 257 241 231 226 [id number]    Basic Metabolic Panel: Recent Labs  Lab 08/23/20 1956 08/24/20 0508 08/25/20 0530 08/26/20 0406 08/27/20 0432 08/29/20 0425  NA  --  138 136 137 138 136  K  --  4.9 6.1* 4.6 4.1 4.0  CL  --  111 108 105 104 102  CO2  --  18* 18* '22 25 26  '$ GLUCOSE  --  97 158* 96 93 117*  BUN  --  56* 58* 47* 31* 31*  CREATININE  --  8.46* 8.62* 7.82* 6.05* 6.91*  CALCIUM  --  6.3* 6.8* 7.0* 7.4* 7.4*  MG 1.8  --   --   --   --   --     GFR: Estimated Creatinine  Clearance: 13.2 mL/min (A) (by C-G formula based on SCr of 6.91 mg/dL (H)).  Liver Function Tests: Recent Labs  Lab 08/23/20 1956 08/24/20 0508  AST 14* 14*  ALT 10 9  ALKPHOS 100 82  BILITOT 0.3 0.5  PROT 6.0* 5.5*  ALBUMIN 2.7* 2.4*     CBG: Recent Labs  Lab 08/28/20 2158 08/29/20 0731 08/29/20 1706 08/29/20 2104 08/30/20 0743  GLUCAP 144* 105* 166* 224* 104*     Recent Results (from the past 240 hour(s))  SARS CORONAVIRUS 2 (TAT 6-24 HRS) Nasopharyngeal Nasopharyngeal Swab     Status: None   Collection Time: 08/23/20  7:56  PM   Specimen: Nasopharyngeal Swab  Result Value Ref Range Status   SARS Coronavirus 2 NEGATIVE NEGATIVE Final    Comment: (NOTE) SARS-CoV-2 target nucleic acids are NOT DETECTED.  The SARS-CoV-2 RNA is generally detectable in upper and lower respiratory specimens during the acute phase of infection. Negative results do not preclude SARS-CoV-2 infection, do not rule out co-infections with other pathogens, and should not be used as the sole basis for treatment or other patient management decisions. Negative results must be combined with clinical observations, patient history, and epidemiological information. The expected result is Negative.  Fact Sheet for Patients: SugarRoll.be  Fact Sheet for Healthcare Providers: https://www.woods-mathews.com/  This test is not yet approved or cleared by the [place] FDA and  has been authorized for detection and/or diagnosis of SARS-CoV-2 by FDA under an Emergency Use Authorization (EUA). This EUA will remain  in effect (meaning this test can be used) for the duration of the COVID-19 declaration under Se ction 564(b)(1) of the Act, 21 U.S.C. section 360bbb-3(b)(1), unless the authorization is terminated or revoked sooner.  Performed at [facility], [place] [address], [place], [place] [address]       Radiology Studies: No results found.      LOS: 6 days   Siddiq Kaluzny [employer] on [email or web address]  08/30/2020, 10:41 AM

## 2020-08-31 LAB — GLUCOSE, CAPILLARY
Glucose-Capillary: 153 mg/dL — ABNORMAL HIGH (ref 70–99)
Glucose-Capillary: 170 mg/dL — ABNORMAL HIGH (ref 70–99)
Glucose-Capillary: 159 mg/dL — ABNORMAL HIGH (ref 70–99)
Glucose-Capillary: 138 mg/dL — ABNORMAL HIGH (ref 70–99)

## 2020-08-31 LAB — CBC
HCT: 23.4 % — ABNORMAL LOW (ref 36.0–46.0)
Hemoglobin: 7.7 g/dL — ABNORMAL LOW (ref 12.0–15.0)
MCH: 28.7 pg (ref 26.0–34.0)
MCHC: 32.9 g/dL (ref 30.0–36.0)
MCV: 87.3 fL (ref 80.0–100.0)
Platelets: 231 10*3/uL (ref 150–400)
RBC: 2.68 MIL/uL — ABNORMAL LOW (ref 3.87–5.11)
RDW: 13.7 % (ref 11.5–15.5)
WBC: 8.3 10*3/uL (ref 4.0–10.5)
nRBC: 0 % (ref 0.0–0.2)

## 2020-08-31 NOTE — Progress Notes (Signed)
[facility] PROGRESS NOTE   [name] DOB: 05/24/1975 DOA: 08/23/2020  PCP: System, Provider Not In  Brief History/Interval Summary: 45 y.o.femalewith medical history significant forhypertension, previously insulin-dependent diabetes mellitus, CKD 4, morbid obesity, metabolic acidosis secondary to poor renal function, history of recurrent urinary tract infection, presents to the emergency department for chief concerns of shortness of breath.  Noted to have worsening of her renal function.  Was seen by nephrology and was started on hemodialysis.  Consultants: Nephrology  Procedures: Dialysis catheter placement.  Hemodialysis  Antibiotics: Anti-infectives (From admission, onward)   Start     Dose/Rate Route Frequency Ordered Stop   08/25/20 0000  ceFAZolin (ANCEF) IVPB 2g/100 mL premix  Status:  Discontinued       Note to Pharmacy: To be given in specials   2 g 200 mL/hr over 30 Minutes Intravenous  Once 08/24/20 1444 08/24/20 1850   08/24/20 1436  ceFAZolin (ANCEF) 1-4 GM/50ML-% IVPB       Note to Pharmacy: Maynor, Erin   : cabinet override      08/24/20 1436 08/25/20 0244      Subjective/Interval History: Patient denies any complaints.  Has been having bowel movements.  Small amount of urine.  No longer seeing blood.  She was menstruating the last few days hence blood was noted in the urine.      Assessment/Plan:  AKI on CKD4--progressed to ESRD Patient with progression of her kidney disease to ESRD.  Nephrology is following.   Patient underwent dialysis access placement.  Started on dialysis on 4/12.  Dialyzed again on 4/13, 4/14, 4/16.  Will need to be established with outpatient dialysis center prior to discharge.  Constipation Appears to have resolved.  She had a BM on 4/16.  Continue MiraLAX twice a day.  TSH is normal.   Hypocalcemia Possibly secondary to CKD.  Management per nephrology.  Looks like she was started on calcium  carbonate.  Corrected calcium is 8.6.  Anemia of chronic disease Baseline hemoglobin is around 10-11.  Hemoglobin has been slowly trending downwards.  Patient was started on Epogen.  Hyperkalemia Resolved  Renal hypertension Blood pressure was poorly controlled. Noted to be on amlodipine and metoprolol.  Dose of metoprolol was increased due to poorly controlled blood pressure. Will not be too aggressive in treating her hypertension due to dialysis.  Seems to be better controlled over the last 24 hours.  Discussed with nephrology yesterday.  Type II diabetes, insulin-dependent with CKD stage V  Patient with history of noncompliance.  Currently on SSI.  HbA1c was 5.8 surprisingly. Due to low CBGs she has been off of Lantus.   Low requirements likely due to progression of kidney disease  Morbid obesity Estimated body mass index is 54.72 kg/m as calculated from the following:   Height as of this encounter: '5\' 1"'$  (1.549 m).   Weight as of this encounter: 131.4 kg.    DVT Prophylaxis: Subcutaneous heparin Code Status: Full code Family Communication: Discussed with the patient Disposition Plan:   Hopefully return home when outpatient dialysis had been established.  Status is: Inpatient  Remains inpatient appropriate because:IV treatments appropriate due to intensity of illness or inability to take PO and Inpatient level of care appropriate due to severity of illness   Dispo: The patient is from: Home              Anticipated d/c is to: Home  Patient currently is not medically stable to d/c.   Difficult to place patient No     Medications:  Scheduled: . amLODipine  10 mg Oral Daily  . calcium carbonate  1 tablet Oral TID WC  . Chlorhexidine Gluconate Cloth  6 each Topical Daily  . epoetin (EPOGEN/PROCRIT) injection  4,000 Units Intravenous Q T,Th,Sa-HD  . heparin  5,000 Units Subcutaneous Q8H  . insulin aspart  0-6 Units Subcutaneous TID WC  . metoprolol  succinate  50 mg Oral Daily  . polyethylene glycol  17 g Oral BID  . sertraline  50 mg Oral Daily   Continuous:  [id number], ipratropium-albuterol, ondansetron **OR** ondansetron (ZOFRAN) IV, oxyCODONE, sodium phosphate   Objective:  Vital Signs  Vitals:   08/30/20 0741 08/30/20 1127 08/30/20 2124 08/31/20 0525  BP: (!) 172/82 (!) 156/92 (!) 152/89 (!) 152/87  Pulse: 62 64 64 66  Resp: '16 17 18 16  '$ Temp: 98.1 F (36.7 C) 98 F (36.7 C) 97.6 F (36.4 C) 98.3 F (36.8 C)  TempSrc:  Oral Oral Oral  SpO2: 95% 96% 99% 95%  Weight:      Height:        Intake/Output Summary (Last 24 hours) at 08/31/2020 0935 Last data filed at 08/30/2020 1827 Gross per 24 hour  Intake --  Output 100 ml  Net -100 ml   Filed Weights   08/23/20 1755 08/24/20 0519  Weight: 127.9 kg 131.4 kg    General appearance: Awake alert.  In no distress Resp: Clear to auscultation bilaterally.  Normal effort Cardio: S1-S2 is normal regular.  No S3-S4.  No rubs murmurs or bruit GI: Abdomen is soft.  Nontender nondistended.  Bowel sounds are present normal.  No masses organomegaly    Lab Results:  Data Reviewed: I have personally reviewed following labs and imaging studies  CBC: Recent Labs  Lab 08/27/20 0432 08/29/20 0425 08/30/20 0709 08/31/20 0416  WBC 8.3 8.2 8.9 8.3  HGB 8.3* 7.4* 8.3* 7.7*  HCT 24.8* 22.7* 25.3* 23.4*  MCV 85.2 86.0 86.3 87.3  PLT 231 226 218 [id number]    Basic Metabolic Panel: Recent Labs  Lab 08/25/20 0530 08/26/20 0406 08/27/20 0432 08/29/20 0425  NA 136 137 138 136  K 6.1* 4.6 4.1 4.0  CL 108 105 104 102  CO2 18* '22 25 26  '$ GLUCOSE 158* 96 93 117*  BUN 58* 47* 31* 31*  CREATININE 8.62* 7.82* 6.05* 6.91*  CALCIUM 6.8* 7.0* 7.4* 7.4*    GFR: Estimated Creatinine Clearance: 13.2 mL/min (A) (by C-G formula based on SCr of 6.91 mg/dL (H)).    CBG: Recent Labs  Lab 08/30/20 0743 08/30/20 1117 08/30/20 1707 08/30/20 2054 08/31/20 0732  GLUCAP 104*  192* 184* 160* 153*     Recent Results (from the past 240 hour(s))  SARS CORONAVIRUS 2 (TAT 6-24 HRS) Nasopharyngeal Nasopharyngeal Swab     Status: None   Collection Time: 08/23/20  7:56 PM   Specimen: Nasopharyngeal Swab  Result Value Ref Range Status   SARS Coronavirus 2 NEGATIVE NEGATIVE Final    Comment: (NOTE) SARS-CoV-2 target nucleic acids are NOT DETECTED.  The SARS-CoV-2 RNA is generally detectable in upper and lower respiratory specimens during the acute phase of infection. Negative results do not preclude SARS-CoV-2 infection, do not rule out co-infections with other pathogens, and should not be used as the sole basis for treatment or other patient management decisions. Negative results must be combined with clinical observations, patient history, and epidemiological  information. The expected result is Negative.  Fact Sheet for Patients: SugarRoll.be  Fact Sheet for Healthcare Providers: https://www.woods-mathews.com/  This test is not yet approved or cleared by the [place] FDA and  has been authorized for detection and/or diagnosis of SARS-CoV-2 by FDA under an Emergency Use Authorization (EUA). This EUA will remain  in effect (meaning this test can be used) for the duration of the COVID-19 declaration under Se ction 564(b)(1) of the Act, 21 U.S.C. section 360bbb-3(b)(1), unless the authorization is terminated or revoked sooner.  Performed at [facility], [place] [address], [place], [place] [address]       Radiology Studies: No results found.     LOS: 7 days   Myan Locatelli [employer] on [email or web address]  08/31/2020, 9:35 AM

## 2020-08-31 NOTE — Progress Notes (Signed)
[facility]  ROUNDING NOTE   Subjective:   Patient walking around nurses station. No complaints. States her swelling is better.    Objective:  Vital signs in last 24 hours:  Temp:  [97.6 F (36.4 C)-98.3 F (36.8 C)] 97.8 F (36.6 C) (04/18 1132) Pulse Rate:  [61-68] 61 (04/18 1132) Resp:  [16-18] 16 (04/18 1132) BP: (149-157)/(87-89) 157/88 (04/18 1132) SpO2:  [95 %-99 %] 98 % (04/18 1132)  Weight change:  Filed Weights   08/23/20 1755 08/24/20 0519  Weight: 127.9 kg 131.4 kg    Intake/Output: I/O last 3 completed shifts: In: -  Out: 400 [Urine:400]   Intake/Output this shift:  No intake/output data recorded.  Physical Exam: General: NAD, sitting up in chair   Head: Normocephalic, atraumatic. Moist oral mucosal membranes  Eyes: Anicteric, PERRL  Neck: Supple, trachea midline  Lungs:  Clear to auscultation  Heart: Regular rate and rhythm  Abdomen:  Soft, nontender,   Extremities:  trace peripheral edema.  Neurologic: Nonfocal, moving all four extremities  Skin: No lesions  Access: Right IJ perm Cath     Basic Metabolic Panel: Recent Labs  Lab 08/25/20 0530 08/26/20 0406 08/27/20 0432 08/29/20 0425  NA 136 137 138 136  K 6.1* 4.6 4.1 4.0  CL 108 105 104 102  CO2 18* '22 25 26  '$ GLUCOSE 158* 96 93 117*  BUN 58* 47* 31* 31*  CREATININE 8.62* 7.82* 6.05* 6.91*  CALCIUM 6.8* 7.0* 7.4* 7.4*    Liver Function Tests: No results for input(s): AST, ALT, ALKPHOS, BILITOT, PROT, ALBUMIN in the last 168 hours. No results for input(s): LIPASE, AMYLASE in the last 168 hours. No results for input(s): AMMONIA in the last 168 hours.  CBC: Recent Labs  Lab 08/27/20 0432 08/29/20 0425 08/30/20 0709 08/31/20 0416  WBC 8.3 8.2 8.9 8.3  HGB 8.3* 7.4* 8.3* 7.7*  HCT 24.8* 22.7* 25.3* 23.4*  MCV 85.2 86.0 86.3 87.3  PLT 231 226 218 231    Cardiac Enzymes: No results for input(s): CKTOTAL, CKMB, CKMBINDEX, TROPONINI in the last 168  hours.  BNP: Invalid input(s): POCBNP  CBG: Recent Labs  Lab 08/30/20 1117 08/30/20 1707 08/30/20 2054 08/31/20 0732 08/31/20 1130  GLUCAP 192* 184* 160* 153* 170*    Microbiology: Results for orders placed or performed during the hospital encounter of 08/23/20  SARS CORONAVIRUS 2 (TAT 6-24 HRS) Nasopharyngeal Nasopharyngeal Swab     Status: None   Collection Time: 08/23/20  7:56 PM   Specimen: Nasopharyngeal Swab  Result Value Ref Range Status   SARS Coronavirus 2 NEGATIVE NEGATIVE Final    Comment: (NOTE) SARS-CoV-2 target nucleic acids are NOT DETECTED.  The SARS-CoV-2 RNA is generally detectable in upper and lower respiratory specimens during the acute phase of infection. Negative results do not preclude SARS-CoV-2 infection, do not rule out co-infections with other pathogens, and should not be used as the sole basis for treatment or other patient management decisions. Negative results must be combined with clinical observations, patient history, and epidemiological information. The expected result is Negative.  Fact Sheet for Patients: SugarRoll.be  Fact Sheet for Healthcare Providers: https://www.woods-mathews.com/  This test is not yet approved or cleared by the [place] FDA and  has been authorized for detection and/or diagnosis of SARS-CoV-2 by FDA under an Emergency Use Authorization (EUA). This EUA will remain  in effect (meaning this test can be used) for the duration of the COVID-19 declaration under Se ction 564(b)(1) of the Act,  21 U.S.C. section 360bbb-3(b)(1), unless the authorization is terminated or revoked sooner.  Performed at [facility], [place] [address] [place], [place] [address]     Coagulation Studies: No results for input(s): LABPROT, INR in the last 72 hours.  Urinalysis: No results for input(s): COLORURINE, LABSPEC, PHURINE, GLUCOSEU, HGBUR, BILIRUBINUR, KETONESUR, PROTEINUR,  UROBILINOGEN, NITRITE, LEUKOCYTESUR in the last 72 hours.  Invalid input(s): APPERANCEUR    Imaging: No results found.   Medications:    . amLODipine  10 mg Oral Daily  . calcium carbonate  1 tablet Oral TID WC  . Chlorhexidine Gluconate Cloth  6 each Topical Daily  . epoetin (EPOGEN/PROCRIT) injection  4,000 Units Intravenous Q T,Th,Sa-HD  . heparin  5,000 Units Subcutaneous Q8H  . insulin aspart  0-6 Units Subcutaneous TID WC  . metoprolol succinate  50 mg Oral Daily  . polyethylene glycol  17 g Oral BID  . sertraline  50 mg Oral Daily   bisacodyl, ipratropium-albuterol, ondansetron **OR** ondansetron (ZOFRAN) IV, oxyCODONE, sodium phosphate  Assessment/ Plan:  [name] is a 45 y.o.  female  with past medical history of diabetes type 2, hypertension, CKD5, morbid obesity, anemia and metabolic acidosis. She presented to the ED with complaints of shortness of breath and was admitted for AKI. She is a patient at [facility] nephrology.   1. ESRD on dialysis -Rt IJ permcath placed 08/24/20 - plan for dialysis on TTS schedule -Dialysis liaison discharge planning underway  2. Anemia of CKD : hemoglobin 7.7 - EPO with dialysis treatments.   3. Secondary hyperparathyroidism  - on calcium carbonate three times daily with meals   4. Hypertension - amlodipine and metoprolol.    LOS: 7 [name] 4/18/20222:55 PM

## 2020-08-31 NOTE — Progress Notes (Signed)
Insurance team is still in the process of verifying Emergency Medicaid.

## 2020-09-01 ENCOUNTER — Other Ambulatory Visit: Payer: Self-pay

## 2020-09-01 LAB — CBC
HCT: 24 % — ABNORMAL LOW (ref 36.0–46.0)
Hemoglobin: 7.8 g/dL — ABNORMAL LOW (ref 12.0–15.0)
MCH: 28.2 pg (ref 26.0–34.0)
MCHC: 32.5 g/dL (ref 30.0–36.0)
MCV: 86.6 fL (ref 80.0–100.0)
Platelets: 261 10*3/uL (ref 150–400)
RBC: 2.77 MIL/uL — ABNORMAL LOW (ref 3.87–5.11)
RDW: 13.8 % (ref 11.5–15.5)
WBC: 9.5 10*3/uL (ref 4.0–10.5)
nRBC: 0 % (ref 0.0–0.2)

## 2020-09-01 LAB — GLUCOSE, CAPILLARY
Glucose-Capillary: 156 mg/dL — ABNORMAL HIGH (ref 70–99)
Glucose-Capillary: 170 mg/dL — ABNORMAL HIGH (ref 70–99)
Glucose-Capillary: 216 mg/dL — ABNORMAL HIGH (ref 70–99)
Glucose-Capillary: 172 mg/dL — ABNORMAL HIGH (ref 70–99)

## 2020-09-01 MED ORDER — ACETAMINOPHEN 325 MG PO TABS
650.0000 mg | ORAL_TABLET | Freq: Four times a day (QID) | ORAL | Status: AC | PRN
  Administered 2020-09-01: 650 mg via ORAL

## 2020-09-01 MED ORDER — CALCIUM CARBONATE ANTACID 500 MG PO CHEW
1.0000 | CHEWABLE_TABLET | Freq: Three times a day (TID) | ORAL | 1 refills | Status: DC
  Filled 2020-09-01: qty 90, 30d supply, fill #0

## 2020-09-01 MED ORDER — AMLODIPINE BESYLATE 10 MG PO TABS
10.0000 mg | ORAL_TABLET | Freq: Every day | ORAL | 1 refills | Status: DC
  Filled 2020-09-01: qty 30, 30d supply, fill #0
  Filled 2020-10-16: qty 7, 7d supply, fill #1
  Filled 2020-10-16 (×2): qty 30, 30d supply, fill #1
  Filled 2020-11-06: qty 23, 23d supply, fill #2

## 2020-09-01 MED ORDER — METOPROLOL SUCCINATE ER 50 MG PO TB24
50.0000 mg | ORAL_TABLET | Freq: Every day | ORAL | 1 refills | Status: DC
  Filled 2020-09-01: qty 30, 30d supply, fill #0

## 2020-09-01 MED ORDER — ALTEPLASE 2 MG IJ SOLR
2.0000 mg | Freq: Once | INTRAMUSCULAR | Status: AC
  Administered 2020-09-01: 2 mg
  Filled 2020-09-01 (×2): qty 2

## 2020-09-01 MED ORDER — EPOETIN ALFA 10000 UNIT/ML IJ SOLN
10000.0000 [IU] | INTRAMUSCULAR | Status: DC
Start: 2020-09-01 — End: 2020-09-01
  Administered 2020-09-01: 10000 [IU] via INTRAVENOUS

## 2020-09-01 MED ORDER — SERTRALINE HCL 50 MG PO TABS
50.0000 mg | ORAL_TABLET | Freq: Every day | ORAL | 1 refills | Status: DC
  Filled 2020-09-01 – 2020-09-03 (×2): qty 30, 30d supply, fill #0
  Filled 2020-10-16: qty 30, 30d supply, fill #1

## 2020-09-01 MED ORDER — ALTEPLASE 2 MG IJ SOLR
2.0000 mg | Freq: Once | INTRAMUSCULAR | Status: AC
  Administered 2020-09-01: 2 mg

## 2020-09-01 MED ORDER — HEPARIN SODIUM (PORCINE) 1000 UNIT/ML IJ SOLN
4200.0000 [IU] | Freq: Once | INTRAMUSCULAR | Status: AC
  Administered 2020-09-01: 4600 [IU]

## 2020-09-01 MED ORDER — POLYETHYLENE GLYCOL 3350 17 G PO PACK
17.0000 g | PACK | Freq: Two times a day (BID) | ORAL | 0 refills | Status: DC
  Filled 2020-09-01: qty 60, 30d supply, fill #0

## 2020-09-01 MED ORDER — CHLORHEXIDINE GLUCONATE CLOTH 2 % EX PADS
6.0000 | MEDICATED_PAD | Freq: Every day | CUTANEOUS | Status: DC
  Administered 2020-09-02: 6 via TOPICAL

## 2020-09-01 NOTE — Discharge Instructions (Signed)
Enfermedad renal en etapa terminal End-Stage Kidney Disease La enfermedad renal en etapa terminal ocurre cuando los riones estn tan daados que dejan de funcionar y ya no tienen mejora. Esta enfermedad se conoce como enfermedad renal en etapa terminal. Los riones son dos rganos que desempean muchas funciones importantes en el cuerpo, que incluyen las siguientes:  Eliminar desechos y el exceso de lquido de la sangre para producir orina.  Producir hormonas que [employer] cantidad de lquido [employer] tejidos y los vasos sanguneos.  Mantener la cantidad correcta de lquidos y de sustancias qumicas en el cuerpo. Sin un buen funcionamiento de los riones, las toxinas se Therapist, art sangre y pueden ocurrir complicaciones que ponen en riesgo la vida. Cules son las causas? Esta enfermedad generalmente ocurre cuando una enfermedad renal a largo plazo (crnica) empeora y provoca un dao permanente en los riones. Tambin puede originarse por un dao repentino en los riones (lesin renal aguda). Qu incrementa el riesgo? Los siguientes factores pueden hacer que sea ms propenso a Armed forces training and education officer afeccin:  Tener antecedentes familiares de enfermedad renal crnica (ERC).  Otras afecciones mdicas crnicas que afectan los riones, tales como: ? Enfermedad cardiovascular, incluida hipertensin arterial. ? Diabetes. ? Ciertas enfermedades que afectan el sistema de defensa del organismo contra enfermedades (sistemainmunitario).  Fumar o tener antecedentes de tabaquismo.  Abuso de medicamentos de venta libre para Glass blower/designer.  Estar cerca de sustancias venenosas (txicas) o entrar en contacto con ellas. Cules son los signos o sntomas? Los sntomas de esta afeccin incluyen:  Hinchazn de la cara, las piernas, los tobillos o los pies.  Adormecimiento, hormigueo o prdida de la sensibilidad en las manos o los pies.  Cansancio (letargo).  Nuseas o vmitos.  Confusin,  dificultad para concentrarse o prdida de la conciencia.  Dolor de pecho.  Falta de aire.  Orinar poco o no orinar.  Contracciones y calambres musculares, especialmente en las piernas.  Cambios en la piel, por ejemplo: ? Piel seca y que pica. ? Piel plida debido a la anemia, lo que incluye la piel y el tejido alrededor del ojo (conjuntiva). ? La piel se oscurece o se aclara de manera anormal.  Prdida del apetito.  Dolores de [place].  Disminucin de la masa muscular (atrofia muscular).  Moretones que aparecen con facilidad.  Interrupcin de los perodos [employer].  Movimientos espasmdicos (convulsiones). Cmo se diagnostica? Esta afeccin se puede diagnosticar en funcin de lo siguiente:  Un examen fsico, lo que incluye mediciones de la presin arterial.  Anlisis de orina.  Anlisis de [place].  Estudios de diagnstico por imgenes.  Un estudio en el que se toma una muestra de tejido de los riones para examinarla con un microscopio (biopsia de rin). Cmo se trata? El tratamiento de esta afeccin puede incluir:  Dilisis, que es un procedimiento que elimina los desechos txicos del organismo. Hay dos tipos de dilisis: ? La dilisis que se realiza a travs del abdomen. Esta se llama dilisis peritoneal y puede hacerse [place]. ? La dilisis que se hace por medio de [place]. Esta se llama hemodilisis y puede hacerse varias veces a la semana.  Trasplante de rin. Se trata de una ciruga para recibir un nuevo rin. Adems de la dilisis o de un trasplante de rin, tal vez deba tomar medicamentos:  Para controlar la presin arterial elevada (hipertensin).  Para controlar el colesterol.  Para tratar la diabetes.  Para mantener un nivel saludable de minerales  en la sangre (electrolitos). Tambin pueden indicarle que siga una dieta especfica que incluye requerimientos o lmites para lo siguiente:  Sal  (sodio).  Protena.  Fsforo.  Potasio.  Calcio. Siga estas instrucciones en su casa: Medicamentos  Use los medicamentos de venta libre y los recetados solamente como se lo haya indicado el mdico.  No tome ningn medicamento, vitamina o suplemento mineral nuevo, a menos que lo haya autorizado el mdico. Muchos medicamentos y suplementos pueden empeorar el dao renal.  Siga las indicaciones del mdico en cuanto al ajuste de la dosis de cualquier medicamento que tome. Estilo de vida  No consuma ningn producto que contenga nicotina o tabaco, como cigarrillos, cigarrillos electrnicos y tabaco de Higher education careers adviser. Si necesita ayuda para dejar de consumir estos productos, consulte al [employer].  Doctor, hospital y [place] un peso saludable. Si necesita ayuda para lograrlo, consulte a su mdico.  Comience o contine un plan de ejercicios. Haga ejercicios al menos 31mnutos al da, 5das a la semana.  Siga el plan alimenticio prescrito. Indicaciones generales  Est al da con las vacunas (inmunizaciones) como se lo haya indicado el mdico.  Lleve un control de la presin arterial. Informe los cambios en la presin arterial como se lo haya indicado el mdico.  Si recibe tratamiento para la diabetes, controle y rRecruitment consultantun seguimiento de los niveles de aLocation manageren la sangre (glucemia) como se lo haya indicado el mdico.  Cumpla con todas las visitas de seguimiento. Esto es importante. Dnde buscar ms informacin  American Association of Kidney Patients (Asociacin Americana de Pacientes Renales): wBombTimer.gl National Kidney Foundation (FSusan Moore: www.kidney.oL[place](FHigh Bridgepara Problemas Renales): whttps://mathis.com/ Life Options Rehabilitation Program (Programa de Rehabilitacin de Life Options): www.lifeoptions.org  Kidney School: www.kidneyschool.org Comunquese con un mdico si:  Sus sntomas empeoran.  Tiene nuevos sntomas. Solicite ayuda de  inmediato si:  Siente debilidad en un brazo o una pierna de un lado del cuerpo.  Tiene dificultad para hablar o habla arrastrando las palabras.  Tiene un cambio repentino en la visin.  Tiene un dolor de cabeza repentino e intenso.  Aumenta repentinamente de peso.  Tiene dificultad para respirar.  Los sntomas empeoran repentinamente.  Siente dolor en el pecho. Resumen  La enfermedad renal en etapa terminal ocurre cuando los riones estn tan daados que dejan de funcionar y ya no tienen mejora.  Sin un buen funcionamiento de los riones, las toxinas se aTherapist, artsangre y pueden ocurrir complicaciones que ponen en riesgo la vida.  El tratamiento puede incluir dilisis o un trasplante de rin adems de medicamentos y cambios en el estilo de vida. Esta informacin no tiene cMarine scientistel consejo del mdico. Asegrese de hacerle al mdico cualquier pregunta que tenga. Document Revised: 10/24/2019 Document Reviewed: 10/24/2019 Elsevier Patient Education  2[place]

## 2020-09-01 NOTE — Progress Notes (Signed)
[facility] PROGRESS NOTE   Cecily Stpeter [id number] DOB: 1975-10-23 DOA: 08/23/2020  PCP: System, Provider Not In  Brief History/Interval Summary: 45 y.o.femalewith medical history significant forhypertension, previously insulin-dependent diabetes mellitus, CKD 4, morbid obesity, metabolic acidosis secondary to poor renal function, history of recurrent urinary tract infection, presents to the emergency department for chief concerns of shortness of breath.  Noted to have worsening of her renal function.  Was seen by nephrology and was started on hemodialysis.  Consultants: Nephrology  Procedures: Dialysis catheter placement.  Hemodialysis  Antibiotics: Anti-infectives (From admission, onward)   Start     Dose/Rate Route Frequency Ordered Stop   08/25/20 0000  ceFAZolin (ANCEF) IVPB 2g/100 mL premix  Status:  Discontinued       Note to Pharmacy: To be given in specials   2 g 200 mL/hr over 30 Minutes Intravenous  Once 08/24/20 1444 08/24/20 1850   08/24/20 1436  ceFAZolin (ANCEF) 1-4 GM/50ML-% IVPB       Note to Pharmacy: Maynor, Erin   : cabinet override      08/24/20 1436 08/25/20 0244      Subjective/Interval History: Patient mentioned that she had transient shortness of breath and nausea yesterday that appears to have resolved.  She has been having regular bowel movements the last 2 days.     Assessment/Plan:  AKI on CKD4--progressed to ESRD Patient with progression of her kidney disease to ESRD.  Nephrology is following.   Patient underwent dialysis access placement.  Started on dialysis on 4/12.  Dialyzed again on 4/13, 4/14, 4/16.  To be dialyzed today.  It appears that she will be on a Tuesday Thursday Saturday schedule. Will need to be established with outpatient dialysis center prior to discharge.  Constipation Appears to have resolved.  She had a BM on 4/16.  Continue MiraLAX twice a day.  TSH is normal.   Hypocalcemia/secondary  hyperparathyroidism Possibly secondary to CKD.  Management per nephrology.  Looks like she was started on calcium carbonate.  Corrected calcium is 8.6.  Anemia of chronic disease Baseline hemoglobin is around 10-11.  Worsening anemia likely due to worsening kidney disease.  Started on Epogen by nephrology.  Hemoglobin stable this morning.    Hyperkalemia Resolved  Renal hypertension Blood pressure was poorly controlled. Noted to be on amlodipine and metoprolol.  Dose of metoprolol was increased due to poorly controlled blood pressure. Blood pressure seems to have improved in the last 24 hours.  Continue to monitor.  Will not be too aggressive considering she is on hemodialysis.  Type II diabetes, insulin-dependent with CKD stage V  Patient with history of noncompliance.  Currently on SSI.  HbA1c was 5.8 surprisingly. Due to low CBGs she has been off of Lantus.   Low requirements likely due to progression of kidney disease Will need to be off of insulin to avoid significant hypoglycemia.  Will need further management of this issue by her outpatient providers  Morbid obesity Estimated body mass index is 54.72 kg/m as calculated from the following:   Height as of this encounter: '5\' 1"'$  (1.549 m).   Weight as of this encounter: 131.4 kg.    DVT Prophylaxis: Subcutaneous heparin Code Status: Full code Family Communication: Discussed with the patient Disposition Plan:   Hopefully return home when outpatient dialysis had been established.  Status is: Inpatient  Remains inpatient appropriate because:IV treatments appropriate due to intensity of illness or inability to take PO and Inpatient level of care appropriate due to  severity of illness   Dispo: The patient is from: Home              Anticipated d/c is to: Home              Patient currently is not medically stable to d/c.   Difficult to place patient No     Medications:  Scheduled: . amLODipine  10 mg Oral Daily  .  calcium carbonate  1 tablet Oral TID WC  . [START ON 09/02/2020] Chlorhexidine Gluconate Cloth  6 each Topical Daily  . epoetin (EPOGEN/PROCRIT) injection  10,000 Units Intravenous Q T,Th,Sa-HD  . heparin  5,000 Units Subcutaneous Q8H  . heparin sodium (porcine)  4,200 Units Intracatheter Once  . insulin aspart  0-6 Units Subcutaneous TID WC  . metoprolol succinate  50 mg Oral Daily  . polyethylene glycol  17 g Oral BID  . sertraline  50 mg Oral Daily   Continuous:  [id number], ipratropium-albuterol, ondansetron **OR** ondansetron (ZOFRAN) IV, oxyCODONE, sodium phosphate   Objective:  Vital Signs  Vitals:   09/01/20 1045 09/01/20 1100 09/01/20 1115 09/01/20 1130  BP: (!) 145/83 125/77 139/74 (!) 147/79  Pulse: 60 61 73 62  Resp: '13 12 17 17  '$ Temp:      TempSrc:      SpO2: 98% 97% 99% 98%  Weight:      Height:        Intake/Output Summary (Last 24 hours) at 09/01/2020 1147 Last data filed at 09/01/2020 0448 Gross per 24 hour  Intake --  Output 500 ml  Net -500 ml   Filed Weights   08/23/20 1755 08/24/20 0519  Weight: 127.9 kg 131.4 kg    General appearance: Awake alert.  In no distress Resp: Clear to auscultation bilaterally.  Normal effort Cardio: S1-S2 is normal regular.  No S3-S4.  No rubs murmurs or bruit GI: Abdomen is soft.  Nontender nondistended.  Bowel sounds are present normal.  No masses organomegaly     Lab Results:  Data Reviewed: I have personally reviewed following labs and imaging studies  CBC: Recent Labs  Lab 08/27/20 0432 08/29/20 0425 08/30/20 0709 08/31/20 0416 09/01/20 0549  WBC 8.3 8.2 8.9 8.3 9.5  HGB 8.3* 7.4* 8.3* 7.7* 7.8*  HCT 24.8* 22.7* 25.3* 23.4* 24.0*  MCV 85.2 86.0 86.3 87.3 86.6  PLT 231 226 218 231 0000000    Basic Metabolic Panel: Recent Labs  Lab 08/26/20 0406 08/27/20 0432 08/29/20 0425  NA 137 138 136  K 4.6 4.1 4.0  CL 105 104 102  CO2 '22 25 26  '$ GLUCOSE 96 93 117*  BUN 47* 31* 31*  CREATININE 7.82*  6.05* 6.91*  CALCIUM 7.0* 7.4* 7.4*    GFR: Estimated Creatinine Clearance: 13.2 mL/min (A) (by C-G formula based on SCr of 6.91 mg/dL (H)).    CBG: Recent Labs  Lab 08/31/20 1130 08/31/20 1646 08/31/20 2130 09/01/20 0811 09/01/20 0951  GLUCAP 170* 159* 138* 156* 170*     Recent Results (from the past 240 hour(s))  SARS CORONAVIRUS 2 (TAT 6-24 HRS) Nasopharyngeal Nasopharyngeal Swab     Status: None   Collection Time: 08/23/20  7:56 PM   Specimen: Nasopharyngeal Swab  Result Value Ref Range Status   SARS Coronavirus 2 NEGATIVE NEGATIVE Final    Comment: (NOTE) SARS-CoV-2 target nucleic acids are NOT DETECTED.  The SARS-CoV-2 RNA is generally detectable in upper and lower respiratory specimens during the acute phase of infection. Negative results do not  preclude SARS-CoV-2 infection, do not rule out co-infections with other pathogens, and should not be used as the sole basis for treatment or other patient management decisions. Negative results must be combined with clinical observations, patient history, and epidemiological information. The expected result is Negative.  Fact Sheet for Patients: SugarRoll.be  Fact Sheet for Healthcare Providers: https://www.woods-mathews.com/  This test is not yet approved or cleared by the [place] FDA and  has been authorized for detection and/or diagnosis of SARS-CoV-2 by FDA under an Emergency Use Authorization (EUA). This EUA will remain  in effect (meaning this test can be used) for the duration of the COVID-19 declaration under Se ction 564(b)(1) of the Act, 21 U.S.C. section 360bbb-3(b)(1), unless the authorization is terminated or revoked sooner.  Performed at [facility], [place] [address], [place], [place] [address]       Radiology Studies: No results found.     LOS: 8 days   Lorma Heater [employer] on [email or web address]  09/01/2020, 11:47  AM

## 2020-09-01 NOTE — Progress Notes (Signed)
[facility]  ROUNDING NOTE   Subjective:  Patient seen during dialysis  Per RN, complaining of dizziness on arrival to suite   Objective:  Vital signs in last 24 hours:  Temp:  [97.8 F (36.6 C)-98.4 F (36.9 C)] 98.1 F (36.7 C) (04/19 0749) Pulse Rate:  [61-68] 62 (04/19 0749) Resp:  [16-18] 16 (04/19 0438) BP: (135-157)/(78-88) 135/82 (04/19 0749) SpO2:  [95 %-99 %] 96 % (04/19 0749)  Weight change:  Filed Weights   08/23/20 1755 08/24/20 0519  Weight: 127.9 kg 131.4 kg    Intake/Output: I/O last 3 completed shifts: In: -  Out: 500 [Urine:500]   Intake/Output this shift:  No intake/output data recorded.  Physical Exam: General: NAD, sitting up in chair   Head: Normocephalic, atraumatic. Moist oral mucosal membranes  Eyes: Anicteric  Lungs:  Clear to auscultation  Heart: Regular rate and rhythm  Abdomen:  Soft, nontender,   Extremities: No peripheral edema.  Neurologic: Nonfocal, moving all four extremities  Skin: No lesions  Access: Right IJ perm Cath     Basic Metabolic Panel: Recent Labs  Lab 08/26/20 0406 08/27/20 0432 08/29/20 0425  NA 137 138 136  K 4.6 4.1 4.0  CL 105 104 102  CO2 '22 25 26  '$ GLUCOSE 96 93 117*  BUN 47* 31* 31*  CREATININE 7.82* 6.05* 6.91*  CALCIUM 7.0* 7.4* 7.4*    Liver Function Tests: No results for input(s): AST, ALT, ALKPHOS, BILITOT, PROT, ALBUMIN in the last 168 hours. No results for input(s): LIPASE, AMYLASE in the last 168 hours. No results for input(s): AMMONIA in the last 168 hours.  CBC: Recent Labs  Lab 08/27/20 0432 08/29/20 0425 08/30/20 0709 08/31/20 0416 09/01/20 0549  WBC 8.3 8.2 8.9 8.3 9.5  HGB 8.3* 7.4* 8.3* 7.7* 7.8*  HCT 24.8* 22.7* 25.3* 23.4* 24.0*  MCV 85.2 86.0 86.3 87.3 86.6  PLT 231 226 218 231 261    Cardiac Enzymes: No results for input(s): CKTOTAL, CKMB, CKMBINDEX, TROPONINI in the last 168 hours.  BNP: Invalid input(s): POCBNP  CBG: Recent Labs  Lab  08/31/20 0732 08/31/20 1130 08/31/20 1646 08/31/20 2130 09/01/20 0811  GLUCAP 153* 170* 159* 138* 156*    Microbiology: Results for orders placed or performed during the hospital encounter of 08/23/20  SARS CORONAVIRUS 2 (TAT 6-24 HRS) Nasopharyngeal Nasopharyngeal Swab     Status: None   Collection Time: 08/23/20  7:56 PM   Specimen: Nasopharyngeal Swab  Result Value Ref Range Status   SARS Coronavirus 2 NEGATIVE NEGATIVE Final    Comment: (NOTE) SARS-CoV-2 target nucleic acids are NOT DETECTED.  The SARS-CoV-2 RNA is generally detectable in upper and lower respiratory specimens during the acute phase of infection. Negative results do not preclude SARS-CoV-2 infection, do not rule out co-infections with other pathogens, and should not be used as the sole basis for treatment or other patient management decisions. Negative results must be combined with clinical observations, patient history, and epidemiological information. The expected result is Negative.  Fact Sheet for Patients: SugarRoll.be  Fact Sheet for Healthcare Providers: https://www.woods-mathews.com/  This test is not yet approved or cleared by the [place] FDA and  has been authorized for detection and/or diagnosis of SARS-CoV-2 by FDA under an Emergency Use Authorization (EUA). This EUA will remain  in effect (meaning this test can be used) for the duration of the COVID-19 declaration under Se ction 564(b)(1) of the Act, 21 U.S.C. section 360bbb-3(b)(1), unless the authorization is terminated or  revoked sooner.  Performed at [facility], [place] [address], [place], [place] [address]     Coagulation Studies: No results for input(s): LABPROT, INR in the last 72 hours.  Urinalysis: No results for input(s): COLORURINE, LABSPEC, PHURINE, GLUCOSEU, HGBUR, BILIRUBINUR, KETONESUR, PROTEINUR, UROBILINOGEN, NITRITE, LEUKOCYTESUR in the last 72 hours.  Invalid  input(s): APPERANCEUR    Imaging: No results found.   Medications:    . amLODipine  10 mg Oral Daily  . calcium carbonate  1 tablet Oral TID WC  . [START ON 09/02/2020] Chlorhexidine Gluconate Cloth  6 each Topical Daily  . epoetin (EPOGEN/PROCRIT) injection  10,000 Units Intravenous Q T,Th,Sa-HD  . heparin  5,000 Units Subcutaneous Q8H  . insulin aspart  0-6 Units Subcutaneous TID WC  . metoprolol succinate  50 mg Oral Daily  . polyethylene glycol  17 g Oral BID  . sertraline  50 mg Oral Daily   bisacodyl, ipratropium-albuterol, ondansetron **OR** ondansetron (ZOFRAN) IV, oxyCODONE, sodium phosphate  Assessment/ Plan:  [name] is a 45 y.o.  female  with past medical history of diabetes type 2, hypertension, CKD5, morbid obesity, anemia and metabolic acidosis. She presented to the ED with complaints of shortness of breath and was admitted for AKI. She is a patient at [facility] nephrology.   1. ESRD on dialysis -Rt IJ permcath placed 08/24/20 - Receiving dialysis today, sitting in chair - UF Goal 1.5L - TPA instilled in Permcath at start of dialysis for sluggish flush  2. Anemia of CKD  - EPO with dialysis  - hgb 7.8  3. Secondary hyperparathyroidism  - on calcium carbonate TID with meals   4. Diabetes type 2 with CKD  - Stable - manage per primary team   5. Hyperkalemia  - resolved.    LOS: 8 Sunil Hue 4/19/20229:30 AM

## 2020-09-01 NOTE — Progress Notes (Signed)
Patient complaining of dizziness. Vitals were checked all within normal limits. BP was high at 158/81. She said this sometimes happens after dialysis. Will continue to monitor.

## 2020-09-02 ENCOUNTER — Other Ambulatory Visit: Payer: Self-pay

## 2020-09-02 DIAGNOSIS — I160 Hypertensive urgency: Secondary | ICD-10-CM

## 2020-09-02 LAB — CBC
HCT: 23.9 % — ABNORMAL LOW (ref 36.0–46.0)
Hemoglobin: 7.8 g/dL — ABNORMAL LOW (ref 12.0–15.0)
MCH: 28.3 pg (ref 26.0–34.0)
MCHC: 32.6 g/dL (ref 30.0–36.0)
MCV: 86.6 fL (ref 80.0–100.0)
Platelets: 268 10*3/uL (ref 150–400)
RBC: 2.76 MIL/uL — ABNORMAL LOW (ref 3.87–5.11)
RDW: 13.7 % (ref 11.5–15.5)
WBC: 8.8 10*3/uL (ref 4.0–10.5)
nRBC: 0 % (ref 0.0–0.2)

## 2020-09-02 LAB — BASIC METABOLIC PANEL
Anion gap: 11 (ref 5–15)
BUN: 27 mg/dL — ABNORMAL HIGH (ref 6–20)
CO2: 26 mmol/L (ref 22–32)
Calcium: 7.6 mg/dL — ABNORMAL LOW (ref 8.9–10.3)
Chloride: 99 mmol/L (ref 98–111)
Creatinine, Ser: 6.24 mg/dL — ABNORMAL HIGH (ref 0.44–1.00)
GFR, Estimated: 8 mL/min — ABNORMAL LOW (ref >60)
Glucose, Bld: 112 mg/dL — ABNORMAL HIGH (ref 70–99)
Potassium: 3.9 mmol/L (ref 3.5–5.1)
Sodium: 136 mmol/L (ref 135–145)

## 2020-09-02 LAB — GLUCOSE, CAPILLARY
Glucose-Capillary: 112 mg/dL — ABNORMAL HIGH (ref 70–99)
Glucose-Capillary: 163 mg/dL — ABNORMAL HIGH (ref 70–99)

## 2020-09-02 MED ORDER — EPOETIN ALFA 10000 UNIT/ML IJ SOLN: 10000.0000 [IU] | INTRAMUSCULAR | Status: DC

## 2020-09-02 MED ORDER — POLYETHYLENE GLYCOL 3350 17 G PO PACK: 17.0000 g | PACK | Freq: Two times a day (BID) | ORAL | 0 refills | Status: DC

## 2020-09-02 MED ORDER — METOPROLOL SUCCINATE ER 50 MG PO TB24: 50.0000 mg | ORAL_TABLET | Freq: Every day | ORAL | 1 refills | Status: DC

## 2020-09-02 NOTE — Discharge Summary (Signed)
5        [facility] NAME: [name]    MR#:  [id number]  DATE OF BIRTH:  [birth year]  DATE OF ADMISSION:  08/23/2020   ADMITTING PHYSICIAN: Fritzi Mandes, MD  DATE OF DISCHARGE: 09/02/2020  4:13 PM  PRIMARY CARE PHYSICIAN: System, Provider Not In   ADMISSION DIAGNOSIS:  Hypocalcemia [E83.51] AKI (acute kidney injury) ([place]) [N17.9] Acute renal failure superimposed on stage 3 chronic kidney disease, unspecified acute renal failure type, unspecified whether stage 3a or 3b CKD ([place]) [N17.9, N18.30] DISCHARGE DIAGNOSIS:  Principal Problem:   AKI (acute kidney injury) ([place]) Active Problems:   Obesity, Class III, BMI 40-49.9 (morbid obesity) ([place])   Diabetes ([place])   Hypertensive urgency   ESRD (end stage renal disease) ([place])   Acute renal failure superimposed on stage 3 chronic kidney disease, unspecified acute renal failure type, unspecified whether stage 3a or 3b CKD ([place])   Hypocalcemia  SECONDARY DIAGNOSIS:   Past Medical History:  Diagnosis Date  . Asthma   . Diabetes ([place])   . Hypertension   . Renal disorder    HOSPITAL COURSE:  45 y.o.femalewith medical history significant forhypertension, previously insulin-dependent diabetes mellitus, CKD 4, morbid obesity, metabolic acidosis secondary to poor renal function, history of recurrent urinary tract infection admitted for worsening shortness of breath.  Noted to have worsening of her renal function.  Was seen by nephrology and was started on hemodialysis.  ESRD Right IJ permacath placed on 4/11, started on dialysis on 4/12. outpatient HD set up at [facility] at 11:15  Constipation Appears to have resolved. Continue MiraLAX at discharge.  TSH is normal.   Hypocalcemia/secondary hyperparathyroidism secondary to ESRD. Corrected calcium is 8.6.  Anemia of chronic kidney disease Epogen per nephrology.  Hemoglobin stable  Hyperkalemia due to ESRD and need for  dialysis Resolved status post dialysis  Essential hypertension Continue Toprol  Type II diabetes, insulin-dependent with CKD stage V  Patient with history of noncompliance.  Currently on SSI.  HbA1c was 5.8 surprisingly. Due to low CBGs she has been off of Lantus.   Low requirements likely due to progression of kidney disease Will need to be off of insulin to avoid significant hypoglycemia.  Will need further management of this issue by her outpatient providers  Morbid obesity Estimated body mass index is 54.72 kg/m as calculated from the following:   Height as of this encounter: '5\' 1"'$  (1.549 m).   Weight as of this encounter: 131.4 kg.    DISCHARGE CONDITIONS:  Stable CONSULTS OBTAINED:   DRUG ALLERGIES:   Allergies  Allergen Reactions  . Cholesterol Rash    Cholesterol medicine   DISCHARGE MEDICATIONS:   Allergies as of 09/02/2020      Reactions   Cholesterol Rash   Cholesterol medicine      Medication List    STOP taking these medications   cholecalciferol 25 MCG (1000 UNIT) tablet Commonly known as: VITAMIN D   furosemide 40 MG tablet Commonly known as: LASIX   Insulin Syringe-Needle U-100 29G X 1" 0.3 ML Misc   Levemir FlexTouch 100 UNIT/ML FlexPen Generic drug: insulin detemir   NovoLIN R 100 units/mL injection Generic drug: insulin regular   sodium bicarbonate 650 MG tablet     TAKE these medications   amLODipine 10 MG tablet Commonly known as: NORVASC Take 1 tablet (10 mg total) by mouth daily.   calcium carbonate 500 MG chewable tablet Commonly known as:  TUMS - dosed in mg elemental calcium Chew 1 tablet (200 mg of elemental calcium total) by mouth 3 (three) times daily with meals.   FeroSul 325 (65 FE) MG tablet Generic drug: ferrous sulfate Take 325 mg by mouth daily.   metoprolol succinate 50 MG 24 hr tablet Commonly known as: TOPROL-XL Take 1 tablet (50 mg total) by mouth daily. Take with or immediately following a meal. What  changed:   medication strength  how much to take  additional instructions   polyethylene glycol 17 g packet Commonly known as: MIRALAX / GLYCOLAX Mix 17 g with 8oz of water and take by mouth 2 (two) times daily.   sertraline 50 MG tablet Commonly known as: ZOLOFT Take 1 tablet (50 mg total) by mouth daily.            Durable Medical Equipment  (From admission, onward)         Start     Ordered   08/27/20 1622  For home use only DME Walker rolling  Once       Question Answer Comment  Walker: With 5 Inch Wheels   Patient needs a walker to treat with the following condition Weakness      08/27/20 1624         DISCHARGE INSTRUCTIONS:   DIET:  Renal diet DISCHARGE CONDITION:  Stable ACTIVITY:  Activity as tolerated OXYGEN:  Home Oxygen: No.  Oxygen Delivery: room air DISCHARGE LOCATION:  home   If you experience worsening of your admission symptoms, develop shortness of breath, life threatening emergency, suicidal or homicidal thoughts you must seek medical attention immediately by calling 911 or calling your MD immediately  if symptoms less severe.  You Must read complete instructions/literature along with all the possible adverse reactions/side effects for all the Medicines you take and that have been prescribed to you. Take any new Medicines after you have completely understood and accpet all the possible adverse reactions/side effects.   Please note  You were cared for by a hospitalist during your hospital stay. If you have any questions about your discharge medications or the care you received while you were in the hospital after you are discharged, you can call the unit and asked to speak with the hospitalist on call if the hospitalist that took care of you is not available. Once you are discharged, your primary care physician will handle any further medical issues. Please note that NO REFILLS for any discharge medications will be authorized once you are  discharged, as it is imperative that you return to your primary care physician (or establish a relationship with a primary care physician if you do not have one) for your aftercare needs so that they can reassess your need for medications and monitor your lab values.    On the day of Discharge:  VITAL SIGNS:  Blood pressure (!) 154/82, pulse 66, temperature 99.4 F (37.4 C), resp. rate 16, height '5\' 1"'$  (1.549 m), weight 131.4 kg, SpO2 97 %. PHYSICAL EXAMINATION:  GENERAL:  45 y.o.-year-old patient lying in the bed with no acute distress.  EYES: Pupils equal, round, reactive to light and accommodation. No scleral icterus. Extraocular muscles intact.  HEENT: Head atraumatic, normocephalic. Oropharynx and nasopharynx clear.  NECK:  Supple, no jugular venous distention. No thyroid enlargement, no tenderness.  LUNGS: Normal breath sounds bilaterally, no wheezing, rales,rhonchi or crepitation. No use of accessory muscles of respiration.  CARDIOVASCULAR: S1, S2 normal. No murmurs, rubs, or gallops.  ABDOMEN: Soft,  non-tender, non-distended. Bowel sounds present. No organomegaly or mass.  EXTREMITIES: No pedal edema, cyanosis, or clubbing.  NEUROLOGIC: Cranial nerves II through XII are intact. Muscle strength 5/5 in all extremities. Sensation intact. Gait not checked.  PSYCHIATRIC: The patient is alert and oriented x 3.  SKIN: No obvious rash, lesion, or ulcer.  DATA REVIEW:   CBC Recent Labs  Lab 09/02/20 0404  WBC 8.8  HGB 7.8*  HCT 23.9*  PLT 268    Chemistries  Recent Labs  Lab 09/02/20 0404  NA 136  K 3.9  CL 99  CO2 26  GLUCOSE 112*  BUN 27*  CREATININE 6.24*  CALCIUM 7.6*     Outpatient follow-up  Follow-up Information    [facility]. Schedule an appointment as soon as possible for a visit in 1 week(s).   Specialty: Primary Care Contact information: [address] [place] [phone]              30  Day Unplanned Readmission Risk Score   Flowsheet Row ED to Hosp-Admission (Discharged) from 08/23/2020 in [facility]  30 Day Unplanned Readmission Risk Score (%) 18.37 Filed at 09/02/2020 1600     This score is the patient's risk of an unplanned readmission within 30 days of being discharged (0 -100%). The score is based on dignosis, age, lab data, medications, orders, and past utilization.   Low:  0-14.9   Medium: 15-21.9   High: 22-29.9   Extreme: 30 and above         Management plans discussed with the patient, nursing and they are in agreement.  CODE STATUS: Full Code   TOTAL TIME TAKING CARE OF THIS PATIENT: 45 minutes.    Max Sane M.D on 09/02/2020 at 4:44 PM  [facility]   CC: Primary care physician; System, Provider Not In   Note: This dictation was prepared with Dragon dictation along with smaller phrase technology. Any transcriptional errors that result from this process are unintentional.

## 2020-09-02 NOTE — Progress Notes (Signed)
[facility]  ROUNDING NOTE   Subjective:  Patient seen sitting in chair Alert and oriented Completed breakfast Denies nausea Denies shortness of breath  Objective:  Vital signs in last 24 hours:  Temp:  [97.8 F (36.6 C)-99.4 F (37.4 C)] 97.8 F (36.6 C) (04/20 0758) Pulse Rate:  [57-74] 67 (04/20 0758) Resp:  [11-21] 20 (04/20 0758) BP: (125-175)/(60-94) 156/82 (04/20 0758) SpO2:  [94 %-100 %] 100 % (04/20 0758)  Weight change:  Filed Weights   08/23/20 1755 08/24/20 0519  Weight: 127.9 kg 131.4 kg    Intake/Output: I/O last 3 completed shifts: In: 240 [P.O.:240] Out: 2600 [Urine:1100; Other:1500]   Intake/Output this shift:  No intake/output data recorded.  Physical Exam: General: NAD, sitting up in chair   Head: Normocephalic, atraumatic. Moist oral mucosal membranes  Eyes: Anicteric  Lungs:  Clear to auscultation  Heart: Regular rate and rhythm  Abdomen:  Soft, nontender,   Extremities: No peripheral edema.  Neurologic: Nonfocal, moving all four extremities  Skin: No lesions  Access: Right IJ perm Cath     Basic Metabolic Panel: Recent Labs  Lab 08/27/20 0432 08/29/20 0425 09/02/20 0404  NA 138 136 136  K 4.1 4.0 3.9  CL 104 102 99  CO2 '25 26 26  '$ GLUCOSE 93 117* 112*  BUN 31* 31* 27*  CREATININE 6.05* 6.91* 6.24*  CALCIUM 7.4* 7.4* 7.6*    Liver Function Tests: No results for input(s): AST, ALT, ALKPHOS, BILITOT, PROT, ALBUMIN in the last 168 hours. No results for input(s): LIPASE, AMYLASE in the last 168 hours. No results for input(s): AMMONIA in the last 168 hours.  CBC: Recent Labs  Lab 08/29/20 0425 08/30/20 0709 08/31/20 0416 09/01/20 0549 09/02/20 0404  WBC 8.2 8.9 8.3 9.5 8.8  HGB 7.4* 8.3* 7.7* 7.8* 7.8*  HCT 22.7* 25.3* 23.4* 24.0* 23.9*  MCV 86.0 86.3 87.3 86.6 86.6  PLT 226 218 231 261 268    Cardiac Enzymes: No results for input(s): CKTOTAL, CKMB, CKMBINDEX, TROPONINI in the last 168  hours.  BNP: Invalid input(s): POCBNP  CBG: Recent Labs  Lab 09/01/20 0811 09/01/20 0951 09/01/20 1635 09/01/20 2154 09/02/20 0759  GLUCAP 156* 170* 216* 172* 112*    Microbiology: Results for orders placed or performed during the hospital encounter of 08/23/20  SARS CORONAVIRUS 2 (TAT 6-24 HRS) Nasopharyngeal Nasopharyngeal Swab     Status: None   Collection Time: 08/23/20  7:56 PM   Specimen: Nasopharyngeal Swab  Result Value Ref Range Status   SARS Coronavirus 2 NEGATIVE NEGATIVE Final    Comment: (NOTE) SARS-CoV-2 target nucleic acids are NOT DETECTED.  The SARS-CoV-2 RNA is generally detectable in upper and lower respiratory specimens during the acute phase of infection. Negative results do not preclude SARS-CoV-2 infection, do not rule out co-infections with other pathogens, and should not be used as the sole basis for treatment or other patient management decisions. Negative results must be combined with clinical observations, patient history, and epidemiological information. The expected result is Negative.  Fact Sheet for Patients: SugarRoll.be  Fact Sheet for Healthcare Providers: https://www.woods-mathews.com/  This test is not yet approved or cleared by the [place] FDA and  has been authorized for detection and/or diagnosis of SARS-CoV-2 by FDA under an Emergency Use Authorization (EUA). This EUA will remain  in effect (meaning this test can be used) for the duration of the COVID-19 declaration under Se ction 564(b)(1) of the Act, 21 U.S.C. section 360bbb-3(b)(1), unless the authorization is  terminated or revoked sooner.  Performed at [facility], [place] [address], [place], [place] [address]     Coagulation Studies: No results for input(s): LABPROT, INR in the last 72 hours.  Urinalysis: No results for input(s): COLORURINE, LABSPEC, PHURINE, GLUCOSEU, HGBUR, BILIRUBINUR, KETONESUR, PROTEINUR,  UROBILINOGEN, NITRITE, LEUKOCYTESUR in the last 72 hours.  Invalid input(s): APPERANCEUR    Imaging: No results found.   Medications:    . amLODipine  10 mg Oral Daily  . calcium carbonate  1 tablet Oral TID WC  . Chlorhexidine Gluconate Cloth  6 each Topical Daily  . heparin  5,000 Units Subcutaneous Q8H  . insulin aspart  0-6 Units Subcutaneous TID WC  . metoprolol succinate  50 mg Oral Daily  . polyethylene glycol  17 g Oral BID  . sertraline  50 mg Oral Daily   bisacodyl, ipratropium-albuterol, ondansetron **OR** ondansetron (ZOFRAN) IV, oxyCODONE, sodium phosphate  Assessment/ Plan:  [name] is a 45 y.o.  female  with past medical history of diabetes type 2, hypertension, CKD5, morbid obesity, anemia and metabolic acidosis. She presented to the ED with complaints of shortness of breath and was admitted for AKI. She is a patient at [facility] nephrology.   1. ESRD on dialysis -Rt IJ permcath placed 08/24/20 - Receiving dialysis yesterday, sitting in chair - UF Goal 1.5L acheived - Next treatment scheduled for tomorrow - Dialysis liaison actively arranging outpatient treatment placement  2. Anemia of CKD  - EPO 10000 units with dialysis  - hgb 7.8  3. Secondary hyperparathyroidism  - on calcium carbonate TID  4. Diabetes type 2 with CKD  - Stable during this admission - manage per primary team   5. Hyperkalemia  - resolved.    LOS: [place] 4/20/20229:31 AM

## 2020-09-02 NOTE — Plan of Care (Signed)
Discharge order received. Patient mental status is at baseline. Vital signs stable . No signs of acute distress. Discharge instructions given. Patient verbalized understanding. No other issues noted at this time.   

## 2020-09-02 NOTE — Progress Notes (Signed)
[facility] PROGRESS NOTE   [name] [id number] DOB: 1975/10/08 DOA: 08/23/2020  PCP: System, Provider Not In  Brief History/Interval Summary: 45 y.o.femalewith medical history significant forhypertension, previously insulin-dependent diabetes mellitus, CKD 4, morbid obesity, metabolic acidosis secondary to poor renal function, history of recurrent urinary tract infection, presents to the emergency department for chief concerns of shortness of breath.  Noted to have worsening of her renal function.  Was seen by nephrology and was started on hemodialysis.  Consultants: Nephrology  Procedures: Dialysis catheter placement.  Hemodialysis  Antibiotics: Anti-infectives (From admission, onward)   Start     Dose/Rate Route Frequency Ordered Stop   08/25/20 0000  ceFAZolin (ANCEF) IVPB 2g/100 mL premix  Status:  Discontinued       Note to Pharmacy: To be given in specials   2 g 200 mL/hr over 30 Minutes Intravenous  Once 08/24/20 1444 08/24/20 1850   08/24/20 1436  ceFAZolin (ANCEF) 1-4 GM/50ML-% IVPB       Note to Pharmacy: Maynor, Erin   : cabinet override      08/24/20 1436 08/25/20 0244      Subjective/Interval History: Patient mentioned that she had transient shortness of breath and nausea yesterday that appears to have resolved.  She has been having regular bowel movements the last 2 days.     Assessment/Plan:  ESRD Right IJ permacath placed on 4/11, started on dialysis on 4/12.  Getting dialyzed per nephrology while here.  She will be on a Tuesday Thursday Saturday schedule. Currently waiting on Medicaid verification for dialysis coordinator  Constipation Appears to have resolved. Continue MiraLAX twice a day.  TSH is normal.   Hypocalcemia/secondary hyperparathyroidism Possibly secondary to ESRD.  Management per nephrology.  Corrected calcium is 8.6.  Anemia of chronic kidney disease Baseline hemoglobin is around 10-11.  Worsening anemia likely due  to worsening kidney disease.  Started on Epogen by nephrology.  Hemoglobin stable  Hyperkalemia Resolved  Essential hypertension Blood pressure was poorly controlled.  Continue amlodipine and metoprolol.  Dose of metoprolol was increased due to poorly controlled blood pressure.  Will adjust as needed  Type II diabetes, insulin-dependent with CKD stage V  Patient with history of noncompliance.  Currently on SSI.  HbA1c was 5.8 surprisingly. Due to low CBGs she has been off of Lantus.   Low requirements likely due to progression of kidney disease Will need to be off of insulin to avoid significant hypoglycemia.  Will need further management of this issue by her outpatient providers  Morbid obesity Estimated body mass index is 54.72 kg/m as calculated from the following:   Height as of this encounter: '5\' 1"'$  (1.549 m).   Weight as of this encounter: 131.4 kg.    DVT Prophylaxis: Subcutaneous heparin Code Status: Full code Family Communication: Discussed with the patient Disposition Plan:   Hopefully return home when outpatient dialysis had been established.  Status is: Inpatient  Remains inpatient appropriate because:IV treatments appropriate due to intensity of illness or inability to take PO and Inpatient level of care appropriate due to severity of illness   Dispo: The patient is from: Home              Anticipated d/c is to: Home              Patient currently is medically stable to d/c.  Waiting for emergency Medicaid verification before she can be discharged to make sure she continues to get outpatient dialysis   Difficult to place  patient No     Medications:  Scheduled: . amLODipine  10 mg Oral Daily  . calcium carbonate  1 tablet Oral TID WC  . Chlorhexidine Gluconate Cloth  6 each Topical Daily  . [START ON 09/03/2020] epoetin (EPOGEN/PROCRIT) injection  10,000 Units Intravenous Q T,Th,Sa-HD  . heparin  5,000 Units Subcutaneous Q8H  . insulin aspart  0-6 Units  Subcutaneous TID WC  . metoprolol succinate  50 mg Oral Daily  . polyethylene glycol  17 g Oral BID  . sertraline  50 mg Oral Daily   Continuous:  [id number], ipratropium-albuterol, ondansetron **OR** ondansetron (ZOFRAN) IV, oxyCODONE, sodium phosphate   Objective:  Vital Signs  Vitals:   09/01/20 2312 09/02/20 0445 09/02/20 0758 09/02/20 1130  BP: (!) 142/71 (!) 141/64 (!) 156/82 (!) 154/82  Pulse: 69 64 67 66  Resp: '18 18 20 16  '$ Temp: 99.4 F (37.4 C) 97.9 F (36.6 C) 97.8 F (36.6 C) 99.4 F (37.4 C)  TempSrc:  Oral Oral   SpO2: 95% 98% 100% 97%  Weight:      Height:        Intake/Output Summary (Last 24 hours) at 09/02/2020 1255 Last data filed at 09/02/2020 0441 Gross per 24 hour  Intake 240 ml  Output 2100 ml  Net -1860 ml   Filed Weights   08/23/20 1755 08/24/20 0519  Weight: 127.9 kg 131.4 kg    General appearance: Awake alert.  In no distress Resp: Clear to auscultation bilaterally.  Normal effort Cardio: S1-S2 is normal regular.  No S3-S4.  No rubs murmurs or bruit GI: Abdomen is soft.  Nontender nondistended.  Bowel sounds are present normal.  No masses organomegaly Dialysis access: Right IJ permacath    Lab Results:  Data Reviewed: I have personally reviewed following labs and imaging studies  CBC: Recent Labs  Lab 08/29/20 0425 08/30/20 0709 08/31/20 0416 09/01/20 0549 09/02/20 0404  WBC 8.2 8.9 8.3 9.5 8.8  HGB 7.4* 8.3* 7.7* 7.8* 7.8*  HCT 22.7* 25.3* 23.4* 24.0* 23.9*  MCV 86.0 86.3 87.3 86.6 86.6  PLT 226 218 231 261 [id number]    Basic Metabolic Panel: Recent Labs  Lab 08/27/20 0432 08/29/20 0425 09/02/20 0404  NA 138 136 136  K 4.1 4.0 3.9  CL 104 102 99  CO2 '25 26 26  '$ GLUCOSE 93 117* 112*  BUN 31* 31* 27*  CREATININE 6.05* 6.91* 6.24*  CALCIUM 7.4* 7.4* 7.6*    GFR: Estimated Creatinine Clearance: 14.6 mL/min (A) (by C-G formula based on SCr of 6.24 mg/dL (H)).    CBG: Recent Labs  Lab 09/01/20 0951  09/01/20 1635 09/01/20 2154 09/02/20 0759 09/02/20 1131  GLUCAP 170* 216* 172* 112* 163*     Recent Results (from the past 240 hour(s))  SARS CORONAVIRUS 2 (TAT 6-24 HRS) Nasopharyngeal Nasopharyngeal Swab     Status: None   Collection Time: 08/23/20  7:56 PM   Specimen: Nasopharyngeal Swab  Result Value Ref Range Status   SARS Coronavirus 2 NEGATIVE NEGATIVE Final    Comment: (NOTE) SARS-CoV-2 target nucleic acids are NOT DETECTED.  The SARS-CoV-2 RNA is generally detectable in upper and lower respiratory specimens during the acute phase of infection. Negative results do not preclude SARS-CoV-2 infection, do not rule out co-infections with other pathogens, and should not be used as the sole basis for treatment or other patient management decisions. Negative results must be combined with clinical observations, patient history, and epidemiological information. The expected result is Negative.  Fact Sheet for Patients: SugarRoll.be  Fact Sheet for Healthcare Providers: https://www.woods-mathews.com/  This test is not yet approved or cleared by the [place] FDA and  has been authorized for detection and/or diagnosis of SARS-CoV-2 by FDA under an Emergency Use Authorization (EUA). This EUA will remain  in effect (meaning this test can be used) for the duration of the COVID-19 declaration under Se ction 564(b)(1) of the Act, 21 U.S.C. section 360bbb-3(b)(1), unless the authorization is terminated or revoked sooner.  Performed at [facility], [place] [address], [place], [place] [address]       Radiology Studies: No results found.     LOS: 9 days   [employer]str[employer] on [email or web address]  09/02/2020, 12:55 PM

## 2020-09-02 NOTE — TOC Transition Note (Signed)
Transition of Care [facility]) - CM/SW Discharge Note   Patient Details  Name: [name] MRN: [id number] Date of Birth: [birth year]  Transition of Care [facility] CM/SW Contact:  Candie Chroman, LCSW Phone Number: 09/02/2020, 2:50 PM   Clinical Narrative: Patient has orders to discharge home today. HD set up at [facility] at 11:15. Patient will need to be there at 10:45 am tomorrow. Patient confirmed her daughter can transport until [employer] is set up. Patient needs a ride home today. [facility] set up to get her home. Address on facesheet is correct. Patient gets [facility]. She confirmed she can afford GoodRx price for Miralax and Metoprolol. She has enough medication at home for the other prescriptions that were continued. Patient is able to drive to [employer] using her daughters car when the medications are ready. No further concerns. CSW signing off.   Final next level of care: Home/Self Care Barriers to Discharge: Barriers Resolved   Patient Goals and CMS Choice        Discharge Placement                Patient to be transferred to facility by: [facility]   Patient and family notified of of transfer: 09/02/20  Discharge Plan and Services   Discharge Planning Services: CM Consult                                 Social Determinants of Health (SDOH) Interventions     Readmission Risk Interventions No flowsheet data found.

## 2020-09-03 ENCOUNTER — Other Ambulatory Visit: Payer: Self-pay

## 2020-09-04 ENCOUNTER — Other Ambulatory Visit: Payer: Self-pay

## 2020-09-04 MED ORDER — METOPROLOL SUCCINATE ER 25 MG PO TB24
25.0000 mg | ORAL_TABLET | Freq: Every day | ORAL | 0 refills | Status: DC
  Filled 2020-09-04: qty 30, 30d supply, fill #0

## 2020-09-04 MED ORDER — POLYETHYLENE GLYCOL 3350 17 G PO PACK
17.0000 g | PACK | Freq: Two times a day (BID) | ORAL | 0 refills | Status: DC
  Filled 2020-09-04: qty 60, 30d supply, fill #0

## 2020-09-07 ENCOUNTER — Other Ambulatory Visit: Payer: Self-pay

## 2020-09-23 ENCOUNTER — Other Ambulatory Visit (INDEPENDENT_AMBULATORY_CARE_PROVIDER_SITE_OTHER): Payer: Self-pay | Admitting: Vascular Surgery

## 2020-09-23 DIAGNOSIS — N184 Chronic kidney disease, stage 4 (severe): Secondary | ICD-10-CM

## 2020-09-26 DIAGNOSIS — N2581 Secondary hyperparathyroidism of renal origin: Secondary | ICD-10-CM | POA: Insufficient documentation

## 2020-09-28 ENCOUNTER — Encounter (INDEPENDENT_AMBULATORY_CARE_PROVIDER_SITE_OTHER): Payer: Self-pay | Admitting: Vascular Surgery

## 2020-09-28 ENCOUNTER — Other Ambulatory Visit: Payer: Self-pay

## 2020-09-28 ENCOUNTER — Ambulatory Visit (INDEPENDENT_AMBULATORY_CARE_PROVIDER_SITE_OTHER): Payer: Self-pay

## 2020-09-28 ENCOUNTER — Ambulatory Visit (INDEPENDENT_AMBULATORY_CARE_PROVIDER_SITE_OTHER): Payer: Self-pay | Admitting: Vascular Surgery

## 2020-09-28 VITALS — BP 189/93 | HR 81 | Resp 16 | Ht 61.0 in | Wt 268.8 lb

## 2020-09-28 DIAGNOSIS — E1122 Type 2 diabetes mellitus with diabetic chronic kidney disease: Secondary | ICD-10-CM

## 2020-09-28 DIAGNOSIS — E781 Pure hyperglyceridemia: Secondary | ICD-10-CM

## 2020-09-28 DIAGNOSIS — N184 Chronic kidney disease, stage 4 (severe): Secondary | ICD-10-CM

## 2020-09-28 DIAGNOSIS — I10 Essential (primary) hypertension: Secondary | ICD-10-CM

## 2020-09-28 DIAGNOSIS — E282 Polycystic ovarian syndrome: Secondary | ICD-10-CM | POA: Insufficient documentation

## 2020-09-28 DIAGNOSIS — N186 End stage renal disease: Secondary | ICD-10-CM

## 2020-09-28 DIAGNOSIS — Z992 Dependence on renal dialysis: Secondary | ICD-10-CM

## 2020-09-28 NOTE — Progress Notes (Signed)
MRN : [id number]  [name] is a 45 y.o. (01/08/1976) female who presents with chief complaint of  Chief Complaint  Patient presents with  . New Patient (Initial Visit)    Ref lateef v-map & consult,vascular access placement  .  History of Present Illness:    The patient is seen for evaluation of upper extremity dialysis access.  This is is her first access.  Current access is via a right IJ catheter which is functioning well.  Catheter was placed 08/24/2020.  Patient is right-handed.  The patient denies amaurosis fugax or recent TIA symptoms. There are no recent neurological changes noted.  The patient denies claudication symptoms or rest pain symptoms.  The patient denies history of DVT, PE or superficial thrombophlebitis. The patient denies recent episodes of angina or shortness of breath.   Vein mapping performed today demonstrates the cephalic vein at the level of the antecubital fossa bilaterally measures 3.5 mm.  Arterial waveforms are triphasic throughout bilaterally.  Current Meds  Medication Sig  . amLODipine (NORVASC) 10 MG tablet Take 1 tablet (10 mg total) by mouth daily.  . calcium carbonate (TUMS - DOSED IN MG ELEMENTAL CALCIUM) 500 MG chewable tablet Chew 1 tablet (200 mg of elemental calcium total) by mouth 3 (three) times daily with meals.  . FEROSUL 325 (65 Fe) MG tablet Take 325 mg by mouth daily.  . metoprolol succinate (TOPROL-XL) 50 MG 24 hr tablet Take 1 tablet (50 mg total) by mouth daily. Take with or immediately following a meal.  . polyethylene glycol (MIRALAX / GLYCOLAX) 17 g packet Mix 17 g with 8oz of water and take by mouth 2 (two) times daily.  . polyethylene glycol (MIRALAX / GLYCOLAX) 17 g packet Mix 17g with 8oz water and take by mouth twice daily    Past Medical History:  Diagnosis Date  . Asthma   . Diabetes ([place])   . Hypertension   . Renal disorder     Past Surgical History:  Procedure Laterality Date  . CESAREAN SECTION  N/A   . CHOLECYSTECTOMY    . DIALYSIS/PERMA CATHETER INSERTION N/A 08/24/2020   Procedure: DIALYSIS/PERMA CATHETER INSERTION;  Surgeon: Algernon Huxley, MD;  Location: [facility];  Service: Cardiovascular;  Laterality: N/A;    Social History Social History   Tobacco Use  . Smoking status: Never Smoker  . Smokeless tobacco: Never Used  Vaping Use  . Vaping Use: Never used  Substance Use Topics  . Alcohol use: Never  . Drug use: Never    Family History Family History  Problem Relation Age of Onset  . Heart attack Father        died at ~25 years of age    Allergies  Allergen Reactions  . Statins     Other reaction(s): Other (See Comments) "redness and facial swelling"  . Cholesterol Rash    Cholesterol medicine     REVIEW OF SYSTEMS (Negative unless checked)  Constitutional: '[]'$ Weight loss  '[]'$ Fever  '[]'$ Chills Cardiac: '[]'$ Chest pain   '[]'$ Chest pressure   '[]'$ Palpitations   '[]'$ Shortness of breath when laying flat   '[]'$ Shortness of breath with exertion. Vascular:  '[]'$ Pain in legs with walking   '[]'$ Pain in legs at rest  '[]'$ History of DVT   '[]'$ Phlebitis   '[]'$ Swelling in legs   '[]'$ Varicose veins   '[]'$ Non-healing ulcers Pulmonary:   '[]'$ Uses home oxygen   '[]'$ Productive cough   '[]'$ Hemoptysis   '[]'$ Wheeze  '[]'$ COPD   '[]'$ Asthma Neurologic:  '[]'$ Dizziness   '[]'$   Seizures   '[]'$ History of stroke   '[]'$ History of TIA  '[]'$ Aphasia   '[]'$ Vissual changes   '[]'$ Weakness or numbness in arm   '[]'$ Weakness or numbness in leg Musculoskeletal:   '[]'$ Joint swelling   '[]'$ Joint pain   '[]'$ Low back pain Hematologic:  '[]'$ Easy bruising  '[]'$ Easy bleeding   '[]'$ Hypercoagulable state   '[]'$ Anemic Gastrointestinal:  '[]'$ Diarrhea   '[]'$ Vomiting  '[]'$ Gastroesophageal reflux/heartburn   '[]'$ Difficulty swallowing. Genitourinary:  '[x]'$ Chronic kidney disease   '[]'$ Difficult urination  '[]'$ Frequent urination   '[]'$ Blood in urine Skin:  '[]'$ Rashes   '[]'$ Ulcers  Psychological:  '[]'$ History of anxiety   '[]'$  History of major depression.  Physical Examination  Vitals:    09/28/20 0920  BP: (!) 189/93  Pulse: 81  Resp: 16  Weight: 268 lb 12.8 oz (121.9 kg)  Height: '5\' 1"'$  (1.549 m)   Body mass index is 50.79 kg/m. Gen: WD/WN, NAD Head: [place]/AT, No temporalis wasting.  Ear/Nose/Throat: Hearing grossly intact, nares w/o erythema or drainage Eyes: PER, EOMI, sclera nonicteric.  Neck: Supple, no large masses.   Pulmonary:  Good air movement, no audible wheezing bilaterally, no use of accessory muscles.  Cardiac: RRR, no JVD Vascular: right IJ catheter CD&I Vessel Right Left  Radial Palpable Palpable  Ulnar Palpable Palpable  Brachial Palpable Palpable  Gastrointestinal: Non-distended. No guarding/no peritoneal signs.  Musculoskeletal: M/S 5/5 throughout.  No deformity or atrophy.  Neurologic: CN 2-12 intact. Symmetrical.  Speech is fluent. Motor exam as listed above. Psychiatric: Judgment intact, Mood & affect appropriate for pt's clinical situation. Dermatologic: No rashes or ulcers noted.  No changes consistent with cellulitis.   CBC Lab Results  Component Value Date   WBC 8.8 09/02/2020   HGB 7.8 (L) 09/02/2020   HCT 23.9 (L) 09/02/2020   MCV 86.6 09/02/2020   PLT 268 09/02/2020    BMET    Component Value Date/Time   NA 136 09/02/2020 0404   K 3.9 09/02/2020 0404   CL 99 09/02/2020 0404   CO2 26 09/02/2020 0404   GLUCOSE 112 (H) 09/02/2020 0404   BUN 27 (H) 09/02/2020 0404   CREATININE 6.24 (H) 09/02/2020 0404   CALCIUM 7.6 (L) 09/02/2020 0404   GFRNONAA 8 (L) 09/02/2020 0404   GFRAA 17 (L) 12/13/2019 0514   CrCl cannot be calculated (Patient's most recent lab result is older than the maximum 21 days allowed.).  COAG No results found for: INR, PROTIME  Radiology No results found.   Assessment/Plan 1. ESRD (end stage renal disease) ([place]) Recommend:  At this time the patient does not have appropriate extremity access for dialysis  Patient should have a left brachial cephalic fistula created.  The risks, benefits and  alternative therapies were reviewed in detail with the patient.  All questions were answered.  The patient agrees to proceed with surgery.    2. Essential hypertension Continue antihypertensive medications as already ordered, these medications have been reviewed and there are no changes at this time.   3. Type 2 diabetes mellitus with chronic kidney disease on chronic dialysis, unspecified whether long term insulin use ([place]) Continue hypoglycemic medications as already ordered, these medications have been reviewed and there are no changes at this time.  Hgb A1C to be monitored as already arranged by primary service   4. Hypertriglyceridemia Continue statin as ordered and reviewed, no changes at this time     [name], MD  09/28/2020 10:06 AM

## 2020-10-01 ENCOUNTER — Telehealth (INDEPENDENT_AMBULATORY_CARE_PROVIDER_SITE_OTHER): Payer: Self-pay

## 2020-10-01 NOTE — Telephone Encounter (Signed)
Spoke with the patient on 09/29/20 regarding her left brachial cephalic fistula surgery with Dr. Delana Meyer. Patient agreed to 11/06/20 for her surgery and pre-surgical instructions being mailed. Patient is scheduled on 10/28/20 between 1-5 for a pre-op.

## 2020-10-16 ENCOUNTER — Other Ambulatory Visit: Payer: Self-pay

## 2020-10-16 ENCOUNTER — Encounter (INDEPENDENT_AMBULATORY_CARE_PROVIDER_SITE_OTHER): Payer: Self-pay

## 2020-10-16 NOTE — Telephone Encounter (Signed)
Patient's left brachial cephalic AV fistula surgery has been moved from 11/06/20 to 11/20/20. Patient's pre-op is on 11/11/20 phone call between 8-1  pm and will remain the same. Pre-surgical instructions will be mailed.

## 2020-10-26 ENCOUNTER — Other Ambulatory Visit (INDEPENDENT_AMBULATORY_CARE_PROVIDER_SITE_OTHER): Payer: Self-pay | Admitting: Nurse Practitioner

## 2020-10-28 ENCOUNTER — Other Ambulatory Visit: Payer: Self-pay

## 2020-11-06 ENCOUNTER — Other Ambulatory Visit: Payer: Self-pay

## 2020-11-11 ENCOUNTER — Encounter
Admission: RE | Admit: 2020-11-11 | Discharge: 2020-11-11 | Disposition: A | Discharge status: Home / Self Care | Payer: Self-pay | Source: Ambulatory Visit | Attending: Vascular Surgery | Admitting: Vascular Surgery

## 2020-11-11 ENCOUNTER — Telehealth: Payer: Self-pay

## 2020-11-11 ENCOUNTER — Ambulatory Visit: Payer: Self-pay | Admitting: Pharmacy Technician

## 2020-11-11 ENCOUNTER — Other Ambulatory Visit: Payer: Self-pay

## 2020-11-11 DIAGNOSIS — Z79899 Other long term (current) drug therapy: Secondary | ICD-10-CM

## 2020-11-11 HISTORY — DX: Depression, unspecified: F32.A

## 2020-11-11 HISTORY — DX: Gastro-esophageal reflux disease without esophagitis: K21.9

## 2020-11-11 HISTORY — DX: Concussion with loss of consciousness of unspecified duration, initial encounter: S06.0X9A

## 2020-11-11 HISTORY — DX: Headache, unspecified: R51.9

## 2020-11-11 HISTORY — DX: Palpitations: R00.2

## 2020-11-11 HISTORY — DX: Family history of other specified conditions: Z84.89

## 2020-11-11 HISTORY — DX: Dependence on renal dialysis: Z99.2

## 2020-11-11 HISTORY — DX: Other complications of anesthesia, initial encounter: T88.59XA

## 2020-11-11 HISTORY — DX: Concussion with loss of consciousness status unknown, initial encounter: S06.0XAA

## 2020-11-11 HISTORY — DX: Anemia, unspecified: D64.9

## 2020-11-11 HISTORY — DX: Pneumonia, unspecified organism: J18.9

## 2020-11-11 NOTE — Progress Notes (Signed)
Interpretation provided by Elberta Fortis, ID# [id number].  Patient stated that she preferred [place].  Interpreter available if patient struggled understanding English.    Met with patient completed financial assistance application for [place] due to recent hospital visit.  Forwarding to appropriate department in [facility].    Completed [facility] application and contract.  Patient agreed to all terms of the [facility] contract.    Patient approved to receive medication assistance at [facility] until time for re-certification in 4818, and as long as eligibility criteria continues to be met.  Patient stated that she feels down since her sickness and the need to have to start dialysis.    Referred patient to [facility] for primary care and counseling.  Also to [place] [facility].  Provided patient with [facility] based on her particular needs.    [place] [facility]

## 2020-11-11 NOTE — Patient Instructions (Addendum)
Your procedure is scheduled on:11-20-20 Friday Report to the Registration Desk on the 1st floor of the [facility]-Then proceed to the 2nd floor Surgery Desk in the [place] To find out your arrival time, please call [phone] between 1PM - 3PM on:11-19-20 Thursday  REMEMBER: Instructions that are not followed completely may result in serious medical risk, up to and including death; or upon the discretion of your surgeon and anesthesiologist your surgery may need to be rescheduled.  Do not eat food after midnight the night before surgery.  No gum chewing, lozengers or hard candies.  You may however, drink CLEAR liquids up to 2 hours before you are scheduled to arrive for your surgery. Do not drink anything within 2 hours of your scheduled arrival time.  Clear liquids include: - water  - apple juice without pulp - gatorade  - black coffee or tea (Do NOT add milk or creamers to the coffee or tea) Do NOT drink anything that is not on this list.  TAKE THESE MEDICATIONS THE MORNING OF SURGERY WITH A SIP OF WATER: -Norvasc (Amlodipine) -Metoprolol (Toprol)  One week prior to surgery: Stop Anti-inflammatories (NSAIDS) such as Advil, Aleve, Ibuprofen, Motrin, Naproxen, Naprosyn and Aspirin based products such as Excedrin, Goodys Powder, BC Powder.You may however, continue to take Tylenol if needed for pain up until the day of surgery.  Stop ANY OVER THE COUNTER supplements/vitamins NOW  (11-11-20) until after surgery.  No Alcohol for 24 hours before or after surgery.  No Smoking including e-cigarettes for 24 hours prior to surgery.  No chewable tobacco products for at least 6 hours prior to surgery.  No nicotine patches on the day of surgery.  Do not use any "recreational" drugs for at least a week prior to your surgery.  Please be advised that the combination of cocaine and anesthesia may have negative outcomes, up to and including death. If you test positive for cocaine, your  surgery will be cancelled.  On the morning of surgery brush your teeth with toothpaste and water, you may rinse your mouth with mouthwash if you wish. Do not swallow any toothpaste or mouthwash.  Do not wear jewelry, make-up, hairpins, clips or nail polish.  Do not wear lotions, powders, or perfumes.   Do not shave body from the neck down 48 hours prior to surgery just in case you cut yourself which could leave a site for infection.  Also, freshly shaved skin may become irritated if using the CHG soap.  Contact lenses, hearing aids and dentures may not be worn into surgery.  Do not bring valuables to the hospital. [facility] is not responsible for any missing/lost belongings or valuables.   Use CHG Soap as directed on instruction sheet.  Notify your doctor if there is any change in your medical condition (cold, fever, infection).  Wear comfortable clothing (specific to your surgery type) to the hospital.  After surgery, you can help prevent lung complications by doing breathing exercises.  Take deep breaths and cough every 1-2 hours. Your doctor may order a device called an Incentive Spirometer to help you take deep breaths. When coughing or sneezing, hold a pillow firmly against your incision with both hands. This is called "splinting." Doing this helps protect your incision. It also decreases belly discomfort.  If you are being admitted to the hospital overnight, leave your suitcase in the car. After surgery it may be brought to your room.  If you are being discharged the day of surgery,  you will not be allowed to drive home. You will need a responsible adult (18 years or older) to drive you home and stay with you that night.   If you are taking public transportation, you will need to have a responsible adult (18 years or older) with you. Please confirm with your physician that it is acceptable to use public transportation.   Please call the [facility] at [phone] if you have any questions about these instructions.  Surgery Visitation Policy:  Patients undergoing a surgery or procedure may have one family member or support person with them as long as that person is not COVID-19 positive or experiencing its symptoms.  That person may remain in the waiting area during the procedure.  Inpatient Visitation:    Visiting hours are 7 a.m. to 8 p.m. Inpatients will be allowed two visitors daily. The visitors may change each day during the patient's stay. No visitors under the age of 65. Any visitor under the age of 32 must be accompanied by an adult. The visitor must pass COVID-19 screenings, use hand sanitizer when entering and exiting the patient's room and wear a mask at all times, including in the patient's room. Patients must also wear a mask when staff or their visitor are in the room. Masking is required regardless of vaccination status.

## 2020-11-11 NOTE — Telephone Encounter (Signed)
Pt was contacted and provided information regarding how to become a pt in the clinic. She will try and make it in to fill out the application [phone] in the evening.

## 2020-11-13 ENCOUNTER — Encounter
Admission: RE | Admit: 2020-11-13 | Discharge: 2020-11-13 | Disposition: A | Discharge status: Home / Self Care | Payer: Self-pay | Source: Ambulatory Visit | Attending: Vascular Surgery | Admitting: Vascular Surgery

## 2020-11-13 ENCOUNTER — Other Ambulatory Visit: Payer: Self-pay

## 2020-11-13 DIAGNOSIS — Z01818 Encounter for other preprocedural examination: Secondary | ICD-10-CM | POA: Insufficient documentation

## 2020-11-13 LAB — CBC WITH DIFFERENTIAL/PLATELET
Abs Immature Granulocytes: 0.03 10*3/uL (ref 0.00–0.07)
Basophils Absolute: 0 10*3/uL (ref 0.0–0.1)
Basophils Relative: 1 %
Eosinophils Absolute: 0.2 10*3/uL (ref 0.0–0.5)
Eosinophils Relative: 3 %
HCT: 28.8 % — ABNORMAL LOW (ref 36.0–46.0)
Hemoglobin: 10.4 g/dL — ABNORMAL LOW (ref 12.0–15.0)
Immature Granulocytes: 0 %
Lymphocytes Relative: 21 %
Lymphs Abs: 1.8 10*3/uL (ref 0.7–4.0)
MCH: 29.5 pg (ref 26.0–34.0)
MCHC: 36.1 g/dL — ABNORMAL HIGH (ref 30.0–36.0)
MCV: 81.8 fL (ref 80.0–100.0)
Monocytes Absolute: 0.4 10*3/uL (ref 0.1–1.0)
Monocytes Relative: 5 %
Neutro Abs: 5.8 10*3/uL (ref 1.7–7.7)
Neutrophils Relative %: 70 %
Platelets: 274 10*3/uL (ref 150–400)
RBC: 3.52 MIL/uL — ABNORMAL LOW (ref 3.87–5.11)
RDW: 13.2 % (ref 11.5–15.5)
WBC: 8.2 10*3/uL (ref 4.0–10.5)
nRBC: 0 % (ref 0.0–0.2)

## 2020-11-13 LAB — BASIC METABOLIC PANEL
Anion gap: 9 (ref 5–15)
BUN: 35 mg/dL — ABNORMAL HIGH (ref 6–20)
CO2: 25 mmol/L (ref 22–32)
Calcium: 8.5 mg/dL — ABNORMAL LOW (ref 8.9–10.3)
Chloride: 94 mmol/L — ABNORMAL LOW (ref 98–111)
Creatinine, Ser: 6.42 mg/dL — ABNORMAL HIGH (ref 0.44–1.00)
GFR, Estimated: 8 mL/min — ABNORMAL LOW (ref >60)
Glucose, Bld: 466 mg/dL — ABNORMAL HIGH (ref 70–99)
Potassium: 4.4 mmol/L (ref 3.5–5.1)
Sodium: 128 mmol/L — ABNORMAL LOW (ref 135–145)

## 2020-11-13 LAB — TYPE AND SCREEN
ABO/RH(D): A POS
Antibody Screen: NEGATIVE

## 2020-11-13 LAB — APTT: aPTT: 30 seconds (ref 24–36)

## 2020-11-13 LAB — PROTIME-INR
INR: 1 (ref 0.8–1.2)
Prothrombin Time: 13.2 seconds (ref 11.4–15.2)

## 2020-11-17 ENCOUNTER — Other Ambulatory Visit: Payer: Self-pay

## 2020-11-17 ENCOUNTER — Ambulatory Visit: Payer: Self-pay | Admitting: Gerontology

## 2020-11-17 ENCOUNTER — Encounter: Payer: Self-pay | Admitting: Gerontology

## 2020-11-17 VITALS — BP 151/96 | HR 68 | Temp 98.1°F | Ht 62.0 in | Wt 263.0 lb

## 2020-11-17 DIAGNOSIS — I10 Essential (primary) hypertension: Secondary | ICD-10-CM

## 2020-11-17 DIAGNOSIS — N185 Chronic kidney disease, stage 5: Secondary | ICD-10-CM

## 2020-11-17 DIAGNOSIS — Z8659 Personal history of other mental and behavioral disorders: Secondary | ICD-10-CM | POA: Insufficient documentation

## 2020-11-17 DIAGNOSIS — Z7689 Persons encountering health services in other specified circumstances: Secondary | ICD-10-CM

## 2020-11-17 MED ORDER — AMLODIPINE BESYLATE 10 MG PO TABS
10.0000 mg | ORAL_TABLET | Freq: Every day | ORAL | 3 refills | Status: DC
  Filled 2020-11-17: qty 30, 30d supply, fill #0

## 2020-11-17 MED ORDER — BLOOD PRESSURE KIT
1.0000 | PACK | Freq: Every day | 0 refills | Status: AC
  Filled 2020-11-17: qty 1, fill #0

## 2020-11-17 MED ORDER — METOPROLOL SUCCINATE ER 50 MG PO TB24: 50.0000 mg | ORAL_TABLET | Freq: Every day | ORAL | 3 refills | Status: DC
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
## 2020-11-17 NOTE — Patient Instructions (Signed)
Cocinar con menos sal Cooking With Less Salt Cocinar con menos sal es una manera de reducir la cantidad de sodio que los alimentos le aportan. El sodio es uno de los elementos que componen la sal. Se encuentra naturalmente en los alimentos y tambin se agrega a ciertos alimentos. En funcin de su afeccin y salud general, el mdico o el nutricionista pueden recomendarle que reduzca el consumo de Tamora. La [employer] de las personas debe consumir menos de 2,300 miligramos (mg) de Electrical engineer. Si tiene hipertensin, es posible que deba limitar el consumo de sodio a 1,500 mg por da. Siga losconsejos a continuacin que lo ayudarn a Engineer, manufacturing de sodio. Cules son los consejos para consumir menos sodio? Al leer las etiquetas de los alimentos  Revise la etiqueta del alimento antes de comprar o usar ingredientes envasados. Revise siempre la etiqueta para conocer el tamao de la porcin y el contenido de [place]. Busque productos que no contengan ms de 140 mg de sodio por porcin. Verifique la columna de valor porcentual diario para ver qu porcentaje de la cantidad diaria recomendada de sodio contiene una porcin del producto. Los alimentos que tienen 5 % o menos en esta columna se consideran con bajo contenido de [place]. Los alimentos con 20 % o ms se consideran con alto contenido de [place]. No elija los alimentos con sal como uno de los tres primeros ingredientes en la lista de ingredientes. Si la sal es uno de los tres primeros ingredientes, generalmente significa que el alimento tiene alto contenido de [place].  Al ir de compras Compre productos sin sodio o con bajo contenido de sodio. Busque las siguientes palabras en las etiquetas de los alimentos: Bajo contenido de sodio. Sin sodio. Reducido en sodio. Sin agregado de sal. Sin sal. Siempre controle el contenido de sodio, incluso si en la etiqueta de los alimentos dice "con bajo contenido de sodio" o "sin sal agregada". Compre alimentos  frescos. Al cocinar Use hierbas, condimentos sin sal y especias como sustitutos de la sal. Use bicarbonato sdico que no contenga sodio. Cocine los alimentos grillados, en estofado o a la parrilla para agregarles sabor utilizando menos sal. Evite agregar sal a las pastas, el arroz y los cereales calientes. Escurra y enjuague las verduras, los frijoles y las carnes en latas antes de consumir estos alimentos. Evite agregar sal cuando cocina dulces y postres. Cocine con ingredientes con bajo contenido de sodio. Qu alimentos tienen alto contenido de sodio? Verduras Verduras enlatadas regulares (que no sean con bajo contenido de sodio o reducidas en sodio). Chucrut, verduras en escabeche y salsas. Aceitunas. Papas fritas. Anillos de cebollas. Pasta y salsa de tomate en lata comunes. Jugoscomunes de tomate y de verduras. Verduras Surveyor, minerals. Granos Cereales instantneos para comer caliente. Mezclas para bizcochos, panqueques y rellenos de pan. Crutones. Mezclas para pastas o arroz con condimento. Envases comerciales de sopa de fideos. Macarrones con queso envasados o congelados. Galletas saladas comunes. Harina leudante. Bollos. Bagels. Tortillas y wraps deharina. Carnes y otras protenas Carne de vaca o pescado que est salada, Lambert, [place], curada, condimentada o en escabeche. Esto incluye el tocino, el [place], las salchichas, los hot dogs, la carne en [place], la carne [place], los panes de carne, la carne de cerdo salada, la cecina, los arenques en [place], las [place], elatn enlatado comn y las sardinas. Frutos secos con sal. Ines Bloom[place] para untar y quesos procesados. Requesn. Queso azul. Queso feta. Quesoen hebras. [place] comn. Suero de [place]. Steva Colder  enlatada. Es posible que los productos enumerados anteriormente no sean una lista completa de los alimentos con alto contenido de [place]. Las cantidades reales de sodio ser diferentes dependiendo del procesamiento.  Consulte a un nutricionista para obtener ms informacin. Cules son los alimentos con bajo contenido de sodio? Lambert Mody Frutas frescas, congeladas o enlatadas sin agregado de salsa. Jugo de frutas. Verduras Verduras frescas o congeladas sin agregado de salsa. Verduras enlatadas "sin agregado de sal". Salsa de tomate y pastas "sin agregado de sal". Jugos detomate y verduras con bajo contenido de sodio o reducidos en sodio. Granos Fideos, pastas, quinua, arroz. Trigo triturado o inflado, o arroz inflado. Avena comn o rpida (no instantnea). Galletas con bajo contenido de [place]. Pan con bajo contenido de sodio. Panes y pastas integrales. Palomitas de mazsin sal. Carnes y otras protenas [place], aves (sin sodio inyectado) y pescado frescos o congelados sin agregado de Art therapist. Frutos secos sin sal. Lentejas, frijoles y guisantes secos, sin agregado de sal. Frijoles enlatados sin sal. Huevos. Mantequillas de frutossecos sin sal. Atn o pollo enlatado con bajo contenido de sodio. Lcteos Leche. Leche de soja. Yogur. [place], como el queso [place], mozzarella y Educational psychologist. Sorbetes o helados ( taza porporcin). Queso crema. Grasas y aceites Mantequilla o margarina sin sal. Otros alimentos Pudines caseros. Bicarbonato sdico y polvo de hornear que no contengan sodio.Hierbas y especias. Mezclas para condimento con bajo contenido de sodio. Bebidas T y caf. Bebidas con gas. Es posible que los productos enumerados anteriormente no sean una lista completa de los alimentos con bajo contenido de [place]. Las cantidades reales de sodio ser diferentes dependiendo del procesamiento. Consulte a un nutricionista para obtener ms informacin. Cules son algunas alternativas para la sal a la hora de cocinar? A continuacin, se enumeran las hierbas, los condimentos y las especias que pueden usarse en lugar de la sal para dar sabor a las comidas. Las hierbas deben estar frescas o  secas. No elija las mezclas envasadas. Al lado del nombre de cada hierba, especia o condimento, se incluyen algunos ejemplos de alimentos con los que puedencombinarse. Hierbas Hojas de laurel: sopas, platos de carne y verduras, y salsa de espaguetis. [place]: platos de la cocina italiana, sopas, pastas y platos de pescado. Cilantro: carnes, aves y platos de verduras. [place] en polvo: marinadas y platos de la cocina mexicana. Cebolln: alios para ensaladas y platos de patatas. Comino: platos de la cocina mexicana, cuscs y platos de carne. Eneldo: platos de pescado, salsas y ensaladas. Hinojo: platos de carne y verduras, panes y Administrator. Ajo (no use sal de ajo): platos de la cocina italiana, platos de carne, alios para ensaladas y salsas. Mejorana: sopas, platos de patatas y platos de carne. Organo: pizzas y salsa de espaguetis. Perejil: ensaladas, sopas, pastas y platos de carne. Romero: platos de la cocina italiana, alios para ensaladas, sopas y carnes rojas. Azafrn: platos de pescado, pastas y algunos platos de ave. Salvia: rellenos y salsas. Estragn: platos de pescado y de ave. Tomillo: rellenos, carnes y platos de pescado. Alios Jugo de limn: platos de pescado, platos de ave, verduras y ensaladas. Vinagre: alios para ensaladas, verduras y platos de pescado. Especias Canela: platos dulces, como tortas, galletas y pudines. Clavo de olor: pan de [place], pudines y [place] para carnes. Curry: platos de verduras, platos de pescado y de ave, y salteados. Jengibre: platos de verduras, platos de pescado y salteados. Nuez moscada: pastas, verduras, aves, platos de pescado y [place]. Resumen Cocinar con  menos sal es una manera de reducir la cantidad de sodio que aportan los alimentos. Compre productos sin sodio o con bajo contenido de sodio. Revise la etiqueta del alimento antes de usar o comprar ingredientes envasados. Use hierbas, condimentos sin sal y especias como  sustitutos de la sal en las comidas. Esta informacin no tiene Marine scientist el consejo del mdico. Asegresede hacerle al mdico cualquier pregunta que tenga. Document Revised: 06/10/2019 Document Reviewed: 06/10/2019 Elsevier Patient Education  2022 [employer].

## 2020-11-18 ENCOUNTER — Other Ambulatory Visit: Payer: Self-pay

## 2020-11-19 ENCOUNTER — Other Ambulatory Visit: Payer: Self-pay

## 2020-11-19 MED ORDER — ORAL CARE MOUTH RINSE: 15.0000 mL | Freq: Once | OROMUCOSAL | Status: AC

## 2020-11-19 MED ORDER — FAMOTIDINE 20 MG PO TABS: 20.0000 mg | ORAL_TABLET | Freq: Once | ORAL | Status: AC

## 2020-11-19 MED ORDER — CHLORHEXIDINE GLUCONATE CLOTH 2 % EX PADS: 6.0000 | MEDICATED_PAD | Freq: Once | CUTANEOUS | Status: DC

## 2020-11-19 MED ORDER — CHLORHEXIDINE GLUCONATE 0.12 % MT SOLN: 15.0000 mL | Freq: Once | OROMUCOSAL | Status: AC

## 2020-11-19 MED ORDER — CEFAZOLIN SODIUM-DEXTROSE 1-4 GM/50ML-% IV SOLN
1.0000 g | INTRAVENOUS | Status: AC
  Administered 2020-11-20: 1 g via INTRAVENOUS

## 2020-11-19 MED ORDER — SODIUM CHLORIDE 0.9 % IV SOLN: INTRAVENOUS | Status: DC

## 2020-11-20 ENCOUNTER — Other Ambulatory Visit: Payer: Self-pay

## 2020-11-20 ENCOUNTER — Encounter
Admission: RE | Disposition: A | Discharge status: Home / Self Care | Payer: Self-pay | Source: Home / Self Care | Attending: Vascular Surgery

## 2020-11-20 ENCOUNTER — Ambulatory Visit: Payer: Self-pay | Admitting: Anesthesiology

## 2020-11-20 ENCOUNTER — Ambulatory Visit
Admission: RE | Admit: 2020-11-20 | Discharge: 2020-11-20 | Disposition: A | Discharge status: Home / Self Care | Payer: Self-pay | Attending: Vascular Surgery | Admitting: Vascular Surgery

## 2020-11-20 ENCOUNTER — Encounter: Payer: Self-pay | Admitting: Vascular Surgery

## 2020-11-20 DIAGNOSIS — E1122 Type 2 diabetes mellitus with diabetic chronic kidney disease: Secondary | ICD-10-CM | POA: Insufficient documentation

## 2020-11-20 DIAGNOSIS — N186 End stage renal disease: Secondary | ICD-10-CM

## 2020-11-20 DIAGNOSIS — Z992 Dependence on renal dialysis: Secondary | ICD-10-CM | POA: Insufficient documentation

## 2020-11-20 DIAGNOSIS — Z79899 Other long term (current) drug therapy: Secondary | ICD-10-CM | POA: Insufficient documentation

## 2020-11-20 DIAGNOSIS — E781 Pure hyperglyceridemia: Secondary | ICD-10-CM | POA: Insufficient documentation

## 2020-11-20 DIAGNOSIS — Z888 Allergy status to other drugs, medicaments and biological substances status: Secondary | ICD-10-CM | POA: Insufficient documentation

## 2020-11-20 DIAGNOSIS — I120 Hypertensive chronic kidney disease with stage 5 chronic kidney disease or end stage renal disease: Secondary | ICD-10-CM | POA: Insufficient documentation

## 2020-11-20 HISTORY — PX: AV FISTULA PLACEMENT: SHX1204

## 2020-11-20 LAB — GLUCOSE, CAPILLARY: Glucose-Capillary: 158 mg/dL — ABNORMAL HIGH (ref 70–99)

## 2020-11-20 LAB — POCT I-STAT, CHEM 8
BUN: 28 mg/dL — ABNORMAL HIGH (ref 6–20)
Calcium, Ion: 1.1 mmol/L — ABNORMAL LOW (ref 1.15–1.40)
Chloride: 98 mmol/L (ref 98–111)
Creatinine, Ser: 6.9 mg/dL — ABNORMAL HIGH (ref 0.44–1.00)
Glucose, Bld: 317 mg/dL — ABNORMAL HIGH (ref 70–99)
HCT: 30 % — ABNORMAL LOW (ref 36.0–46.0)
Hemoglobin: 10.2 g/dL — ABNORMAL LOW (ref 12.0–15.0)
Potassium: 4.3 mmol/L (ref 3.5–5.1)
Sodium: 131 mmol/L — ABNORMAL LOW (ref 135–145)
TCO2: 22 mmol/L (ref 22–32)

## 2020-11-20 LAB — ABO/RH: ABO/RH(D): A POS

## 2020-11-20 LAB — POCT PREGNANCY, URINE: Preg Test, Ur: NEGATIVE

## 2020-11-20 SURGERY — ARTERIOVENOUS (AV) FISTULA CREATION
Anesthesia: General | Laterality: Left

## 2020-11-20 MED ORDER — GLYCOPYRROLATE 0.2 MG/ML IJ SOLN
INTRAMUSCULAR | Status: AC
  Filled 2020-11-20: qty 1

## 2020-11-20 MED ORDER — SUGAMMADEX SODIUM 500 MG/5ML IV SOLN
INTRAVENOUS | Status: DC | PRN
  Administered 2020-11-20: 300 mg via INTRAVENOUS

## 2020-11-20 MED ORDER — ONDANSETRON HCL 4 MG/2ML IJ SOLN
INTRAMUSCULAR | Status: AC
  Filled 2020-11-20: qty 2

## 2020-11-20 MED ORDER — FAMOTIDINE 20 MG PO TABS
ORAL_TABLET | ORAL | Status: AC
  Administered 2020-11-20: 20 mg via ORAL
  Filled 2020-11-20: qty 1

## 2020-11-20 MED ORDER — HYDROMORPHONE HCL 1 MG/ML IJ SOLN: 1.0000 mg | Freq: Once | INTRAMUSCULAR | Status: DC | PRN

## 2020-11-20 MED ORDER — FENTANYL CITRATE (PF) 100 MCG/2ML IJ SOLN
INTRAMUSCULAR | Status: DC | PRN
  Administered 2020-11-20: 25 ug via INTRAVENOUS
  Administered 2020-11-20: 50 ug via INTRAVENOUS
  Administered 2020-11-20: 25 ug via INTRAVENOUS

## 2020-11-20 MED ORDER — FENTANYL CITRATE (PF) 100 MCG/2ML IJ SOLN
INTRAMUSCULAR | Status: AC
  Filled 2020-11-20: qty 2

## 2020-11-20 MED ORDER — OXYCODONE HCL 5 MG PO TABS
ORAL_TABLET | ORAL | Status: AC
  Filled 2020-11-20: qty 1

## 2020-11-20 MED ORDER — ROCURONIUM BROMIDE 10 MG/ML (PF) SYRINGE
PREFILLED_SYRINGE | INTRAVENOUS | Status: AC
  Filled 2020-11-20: qty 10

## 2020-11-20 MED ORDER — ROCURONIUM BROMIDE 100 MG/10ML IV SOLN
INTRAVENOUS | Status: DC | PRN
  Administered 2020-11-20: 60 mg via INTRAVENOUS

## 2020-11-20 MED ORDER — INSULIN ASPART 100 UNIT/ML IJ SOLN: 5.0000 [IU] | Freq: Once | INTRAMUSCULAR | Status: AC

## 2020-11-20 MED ORDER — SODIUM CHLORIDE 0.9 % IV SOLN
INTRAVENOUS | Status: DC | PRN
  Administered 2020-11-20: 100 mL via INTRAMUSCULAR

## 2020-11-20 MED ORDER — BUPIVACAINE LIPOSOME 1.3 % IJ SUSP
INTRAMUSCULAR | Status: DC | PRN
  Administered 2020-11-20: 20 mL

## 2020-11-20 MED ORDER — MIDAZOLAM HCL 2 MG/2ML IJ SOLN
INTRAMUSCULAR | Status: AC
  Filled 2020-11-20: qty 2

## 2020-11-20 MED ORDER — ONDANSETRON HCL 4 MG/2ML IJ SOLN
INTRAMUSCULAR | Status: DC | PRN
  Administered 2020-11-20: 4 mg via INTRAVENOUS

## 2020-11-20 MED ORDER — PROMETHAZINE HCL 25 MG/ML IJ SOLN: 6.2500 mg | INTRAMUSCULAR | Status: DC | PRN

## 2020-11-20 MED ORDER — SUGAMMADEX SODIUM 500 MG/5ML IV SOLN
INTRAVENOUS | Status: AC
  Filled 2020-11-20: qty 5

## 2020-11-20 MED ORDER — BUPIVACAINE HCL (PF) 0.5 % IJ SOLN
INTRAMUSCULAR | Status: DC | PRN
  Administered 2020-11-20: 30 mL

## 2020-11-20 MED ORDER — MEPERIDINE HCL 25 MG/ML IJ SOLN: 6.2500 mg | INTRAMUSCULAR | Status: DC | PRN

## 2020-11-20 MED ORDER — PROPOFOL 10 MG/ML IV BOLUS
INTRAVENOUS | Status: DC | PRN
  Administered 2020-11-20: 160 mg via INTRAVENOUS

## 2020-11-20 MED ORDER — OXYCODONE HCL 5 MG PO TABS
5.0000 mg | ORAL_TABLET | Freq: Once | ORAL | Status: AC | PRN
  Administered 2020-11-20: 5 mg via ORAL

## 2020-11-20 MED ORDER — LIDOCAINE HCL (CARDIAC) PF 100 MG/5ML IV SOSY
PREFILLED_SYRINGE | INTRAVENOUS | Status: DC | PRN
  Administered 2020-11-20: 100 mg via INTRAVENOUS

## 2020-11-20 MED ORDER — OXYCODONE HCL 5 MG/5ML PO SOLN: 5.0000 mg | Freq: Once | ORAL | Status: AC | PRN

## 2020-11-20 MED ORDER — HEMOSTATIC AGENTS (NO CHARGE) OPTIME
TOPICAL | Status: DC | PRN
  Administered 2020-11-20: 1 via TOPICAL

## 2020-11-20 MED ORDER — CEFAZOLIN SODIUM-DEXTROSE 1-4 GM/50ML-% IV SOLN
INTRAVENOUS | Status: AC
  Filled 2020-11-20: qty 50

## 2020-11-20 MED ORDER — INSULIN ASPART 100 UNIT/ML IJ SOLN
INTRAMUSCULAR | Status: AC
  Administered 2020-11-20: 5 [IU] via SUBCUTANEOUS
  Filled 2020-11-20: qty 1

## 2020-11-20 MED ORDER — PROPOFOL 10 MG/ML IV BOLUS
INTRAVENOUS | Status: AC
  Filled 2020-11-20: qty 40

## 2020-11-20 MED ORDER — MIDAZOLAM HCL 2 MG/2ML IJ SOLN
INTRAMUSCULAR | Status: DC | PRN
  Administered 2020-11-20: 1 mg via INTRAVENOUS

## 2020-11-20 MED ORDER — DEXAMETHASONE SODIUM PHOSPHATE 10 MG/ML IJ SOLN
INTRAMUSCULAR | Status: DC | PRN
  Administered 2020-11-20: 4 mg via INTRAVENOUS

## 2020-11-20 MED ORDER — HYDROCODONE-ACETAMINOPHEN 5-325 MG PO TABS: 1.0000 | ORAL_TABLET | Freq: Four times a day (QID) | ORAL | 0 refills | Status: DC | PRN

## 2020-11-20 MED ORDER — HEPARIN SODIUM (PORCINE) 5000 UNIT/ML IJ SOLN
INTRAMUSCULAR | Status: AC
  Filled 2020-11-20: qty 1

## 2020-11-20 MED ORDER — CHLORHEXIDINE GLUCONATE 0.12 % MT SOLN
OROMUCOSAL | Status: AC
  Administered 2020-11-20: 15 mL via OROMUCOSAL
  Filled 2020-11-20: qty 15

## 2020-11-20 MED ORDER — PHENYLEPHRINE HCL (PRESSORS) 10 MG/ML IV SOLN
INTRAVENOUS | Status: DC | PRN
  Administered 2020-11-20 (×2): 100 ug via INTRAVENOUS

## 2020-11-20 MED ORDER — FENTANYL CITRATE (PF) 100 MCG/2ML IJ SOLN
25.0000 ug | INTRAMUSCULAR | Status: DC | PRN
  Administered 2020-11-20 (×4): 25 ug via INTRAVENOUS

## 2020-11-20 MED ORDER — ONDANSETRON HCL 4 MG/2ML IJ SOLN
4.0000 mg | Freq: Four times a day (QID) | INTRAMUSCULAR | Status: DC | PRN
  Administered 2020-11-20: 4 mg via INTRAVENOUS

## 2020-11-20 MED ORDER — PHENYLEPHRINE HCL (PRESSORS) 10 MG/ML IV SOLN
INTRAVENOUS | Status: AC
  Filled 2020-11-20: qty 1

## 2020-11-20 SURGICAL SUPPLY — 51 items
ADH SKN CLS APL DERMABOND .7 (GAUZE/BANDAGES/DRESSINGS) ×1
APL PRP STRL LF DISP 70% ISPRP (MISCELLANEOUS) ×1
BAG DECANTER FOR FLEXI CONT (MISCELLANEOUS) ×2 IMPLANT
BLADE SURG SZ11 CARB STEEL (BLADE) ×2 IMPLANT
BOOT SUTURE AID YELLOW STND (SUTURE) ×2 IMPLANT
BRUSH SCRUB EZ  4% CHG (MISCELLANEOUS) ×1
BRUSH SCRUB EZ 4% CHG (MISCELLANEOUS) ×1 IMPLANT
CANISTER SUCT 1200ML W/VALVE (MISCELLANEOUS) ×2 IMPLANT
CHLORAPREP W/TINT 26 (MISCELLANEOUS) ×2 IMPLANT
DERMABOND ADVANCED (GAUZE/BANDAGES/DRESSINGS) ×1
DERMABOND ADVANCED .7 DNX12 (GAUZE/BANDAGES/DRESSINGS) ×1 IMPLANT
DRESSING SURGICEL FIBRLLR 1X2 (HEMOSTASIS) ×1 IMPLANT
DRSG SURGICEL FIBRILLAR 1X2 (HEMOSTASIS) ×2
ELECT CAUTERY BLADE 6.4 (BLADE) ×2 IMPLANT
ELECT REM PT RETURN 9FT ADLT (ELECTROSURGICAL) ×2
ELECTRODE REM PT RTRN 9FT ADLT (ELECTROSURGICAL) ×1 IMPLANT
GAUZE 4X4 16PLY ~~LOC~~+RFID DBL (SPONGE) ×2 IMPLANT
GLOVE SURG ENC MOIS LTX SZ7 (GLOVE) ×2 IMPLANT
GLOVE SURG SYN 8.0 (GLOVE) ×2 IMPLANT
GLOVE SURG UNDER LTX SZ7.5 (GLOVE) ×2 IMPLANT
GOWN STRL REUS W/ TWL LRG LVL3 (GOWN DISPOSABLE) ×1 IMPLANT
GOWN STRL REUS W/ TWL XL LVL3 (GOWN DISPOSABLE) ×2 IMPLANT
GOWN STRL REUS W/TWL LRG LVL3 (GOWN DISPOSABLE) ×2
GOWN STRL REUS W/TWL XL LVL3 (GOWN DISPOSABLE) ×4
IV NS 500ML (IV SOLUTION) ×2
IV NS 500ML BAXH (IV SOLUTION) ×1 IMPLANT
KIT TURNOVER KIT A (KITS) ×2 IMPLANT
LABEL OR SOLS (LABEL) ×2 IMPLANT
LOOP RED MAXI  1X406MM (MISCELLANEOUS) ×1
LOOP VESSEL MAXI 1X406 RED (MISCELLANEOUS) ×1 IMPLANT
LOOP VESSEL MINI 0.8X406 BLUE (MISCELLANEOUS) ×1 IMPLANT
LOOPS BLUE MINI 0.8X406MM (MISCELLANEOUS) ×1
MANIFOLD NEPTUNE II (INSTRUMENTS) ×2 IMPLANT
NEEDLE FILTER BLUNT 18X 1/2SAF (NEEDLE) ×1
NEEDLE FILTER BLUNT 18X1 1/2 (NEEDLE) ×1 IMPLANT
PACK EXTREMITY ARMC (MISCELLANEOUS) ×2 IMPLANT
PAD PREP 24X41 OB/GYN DISP (PERSONAL CARE ITEMS) ×2 IMPLANT
STOCKINETTE STRL 4IN 9604848 (GAUZE/BANDAGES/DRESSINGS) ×2 IMPLANT
SUT MNCRL+ 5-0 UNDYED PC-3 (SUTURE) ×1 IMPLANT
SUT MONOCRYL 5-0 (SUTURE) ×1
SUT PROLENE 6 0 BV (SUTURE) ×8 IMPLANT
SUT SILK 2 0 (SUTURE) ×2
SUT SILK 2-0 18XBRD TIE 12 (SUTURE) ×1 IMPLANT
SUT SILK 3 0 (SUTURE) ×2
SUT SILK 3-0 18XBRD TIE 12 (SUTURE) ×1 IMPLANT
SUT SILK 4 0 (SUTURE) ×2
SUT SILK 4-0 18XBRD TIE 12 (SUTURE) ×1 IMPLANT
SUT VIC AB 3-0 SH 27 (SUTURE) ×2
SUT VIC AB 3-0 SH 27X BRD (SUTURE) ×1 IMPLANT
SYR 20ML LL LF (SYRINGE) ×2 IMPLANT
SYR 3ML LL SCALE MARK (SYRINGE) ×2 IMPLANT

## 2020-11-20 NOTE — Transfer of Care (Signed)
Immediate Anesthesia Transfer of Care Note  Patient: [name]  Procedure(s) Performed: ARTERIOVENOUS (AV) FISTULA CREATION ( BRACHIAL CEPHALIC ) (Left)  Patient Location: PACU  Anesthesia Type:General  Level of Consciousness: drowsy  Airway & Oxygen Therapy: Patient Spontanous Breathing and Patient connected to face mask oxygen  Post-op Assessment: Report given to RN and Post -op Vital signs reviewed and stable  Post vital signs: Reviewed  Last Vitals:  Vitals Value Taken Time  BP 123/68 11/20/20 1448  Temp    Pulse 92 11/20/20 1450  Resp 19 11/20/20 1450  SpO2 98 % 11/20/20 1450  Vitals shown include unvalidated device data.  Last Pain:  Vitals:   11/20/20 1024  TempSrc: Oral  PainSc: 0-No pain         Complications: No notable events documented.

## 2020-11-20 NOTE — Anesthesia Preprocedure Evaluation (Signed)
Anesthesia Evaluation  Patient identified by MRN, date of birth, ID band Patient awake    Reviewed: Allergy & Precautions, NPO status , Patient's Chart, lab work & pertinent test results  History of Anesthesia Complications Negative for: history of anesthetic complications  Airway Mallampati: III  TM Distance: >3 FB Neck ROM: Full    Dental  (+) Poor Dentition   Pulmonary asthma , sleep apnea (does not use CPAP) ,    breath sounds clear to auscultation- rhonchi (-) wheezing      Cardiovascular hypertension, Pt. on medications (-) CAD, (-) Past MI, (-) Cardiac Stents and (-) CABG  Rhythm:Regular Rate:Normal - Systolic murmurs and - Diastolic murmurs    Neuro/Psych  Headaches, neg Seizures PSYCHIATRIC DISORDERS Depression    GI/Hepatic Neg liver ROS, GERD  ,  Endo/Other  diabetes  Renal/GU ESRF and DialysisRenal disease     Musculoskeletal negative musculoskeletal ROS (+)   Abdominal (+) + obese,   Peds  Hematology  (+) anemia ,   Anesthesia Other Findings Past Medical History: No date: Anemia No date: Asthma     Comment:  well controlled No date: Complication of anesthesia     Comment:  "takes alot for anesthesia to work" No date: Concussion     Comment:  h/o No date: Depression No date: Diabetes ([place])     Comment:  last A1c was 5.8 on 09-04-20 No date: Dialysis patient [facility])     Comment:  T, Th, and Sat No date: Family history of adverse reaction to anesthesia     Comment:  son-became aggressive after surgery No date: GERD (gastroesophageal reflux disease)     Comment:  occ no meds No date: Headache     Comment:  migraines No date: Hypertension No date: Palpitations No date: Pneumonia     Comment:  h/o No date: Renal disorder   Reproductive/Obstetrics                             Anesthesia Physical Anesthesia Plan  ASA: 4  Anesthesia Plan: General   Post-op Pain  Management:    Induction: Intravenous  PONV Risk Score and Plan: 2 and Ondansetron and Dexamethasone  Airway Management Planned: Oral ETT  Additional Equipment:   Intra-op Plan:   Post-operative Plan: Extubation in OR  Informed Consent: I have reviewed the patients History and Physical, chart, labs and discussed the procedure including the risks, benefits and alternatives for the proposed anesthesia with the patient or authorized representative who has indicated his/her understanding and acceptance.     Dental advisory given  Plan Discussed with: CRNA and Anesthesiologist  Anesthesia Plan Comments:         Anesthesia Quick Evaluation

## 2020-11-20 NOTE — Op Note (Signed)
     OPERATIVE NOTE   PROCEDURE: left brachial cephalic arteriovenous fistula placement  PRE-OPERATIVE DIAGNOSIS: End Stage Renal Disease  POST-OPERATIVE DIAGNOSIS: End Stage Renal Disease  SURGEON: [name]  ASSISTANT(S): Ms. Hezzie Bump  ANESTHESIA: general  ESTIMATED BLOOD LOSS: <50 cc  FINDING(S): 4 mm cephalic vein  SPECIMEN(S):  none  INDICATIONS:   [name] is a 45 y.o. female who presents with end stage renal disease.  The patient is scheduled for left brachiocephalic arteriovenous fistula placement.  The patient is aware the risks include but are not limited to: bleeding, infection, steal syndrome, nerve damage, ischemic monomelic neuropathy, failure to mature, and need for additional procedures.  The patient is aware of the risks of the procedure and elects to proceed forward.  DESCRIPTION: After full informed written consent was obtained from the patient, the patient was brought back to the operating room and placed supine upon the operating table.  Prior to induction, the patient received IV antibiotics.   After obtaining adequate anesthesia, the patient was then prepped and draped in the standard fashion for a left arm access procedure.   A first assistant was required to provide a safe and appropriate environment for executing the surgery.  The assistant was integral in providing retraction, exposure, running suture providing suction and in the closing process.   A curvilinear incision was then created midway between the radial impulse and the cephalic vein. The cephalic vein was then identified and dissected circumferentially. It was marked with a surgical marker.    Attention was then turned to the brachial artery which was exposed through the same incision and looped proximally and distally. Side branches were controlled with 4-0 silk ties.  The distal segment of the vein was ligated with a  2-0 silk, and the vein was transected.  The proximal  segment was interrogated with serial dilators.  The vein accepted up to a 4 mm dilator without any difficulty. Heparinized saline was infused into the vein and clamped it with a small bulldog.  At this point, I reset my exposure of the brachial artery and controlled the artery with vessel loops proximally and distally.  An arteriotomy was then made with a #11 blade, and extended with a Potts scissor.  Heparinized saline was injected proximal and distal into the radial artery.  The vein was then approximated to the artery while the artery was in its native bed and subsequently the vein was beveled using Potts scissors. The vein was then sewn to the artery in an end-to-side configuration with a running stitch of 6-0 Prolene.  Prior to completing this anastomosis Flushing maneuvers were performed and the artery was allowed to forward and back bleed.  There was no evidence of clot from any vessels.  I completed the anastomosis in the usual fashion and then released all vessel loops and clamps.    There was good  thrill in the venous outflow, and there was 1+ palpable radial pulse.  At this point, I irrigated out the surgical wound.  There was no further active bleeding.  The subcutaneous tissue was reapproximated with a running stitch of 3-0 Vicryl.  The skin was then reapproximated with a running subcuticular stitch of 4-0 Vicryl.  The skin was then cleaned, dried, and reinforced with Dermabond.    The patient tolerated this procedure well.   COMPLICATIONS: None  CONDITION: [name]  Office: [phone]   11/20/2020, 2:29 PM

## 2020-11-20 NOTE — Progress Notes (Signed)
Bruit and thrill present in left arm

## 2020-11-20 NOTE — H&P (Signed)
$'@LOGO'y$ @   MRN : [id number]  [name] is a 45 y.o. (03-06-1976) female who presents with chief complaint of No chief complaint on file. Marland Kitchen  History of Present Illness:   The patient is seen for evaluation of upper extremity dialysis access.  This is is her first access.   Current access is via a right IJ catheter which is functioning well.  Catheter was placed 08/24/2020.   Patient is right-handed.   The patient denies amaurosis fugax or recent TIA symptoms. There are no recent neurological changes noted.   The patient denies claudication symptoms or rest pain symptoms.   The patient denies history of DVT, PE or superficial thrombophlebitis. The patient denies recent episodes of angina or shortness of breath.    Vein mapping performed today demonstrates the cephalic vein at the level of the antecubital fossa bilaterally measures 3.5 mm.  Arterial waveforms are triphasic throughout bilaterally.  Current Meds  Medication Sig   acetaminophen (TYLENOL) 500 MG tablet Take 500-1,000 mg by mouth every 6 (six) hours as needed (pain).   CALCITRIOL IV Inject 2 mcg into the vein 3 (three) times a week.   calcium carbonate (TUMS - DOSED IN MG ELEMENTAL CALCIUM) 500 MG chewable tablet Chew 1 tablet (200 mg of elemental calcium total) by mouth 3 (three) times daily with meals. (Patient taking differently: Chew 1 tablet by mouth 2 (two) times daily.)   diphenhydrAMINE (BENADRYL) 25 MG tablet Take 25 mg by mouth every 6 (six) hours as needed. Taking as needed   metoprolol succinate (TOPROL-XL) 50 MG 24 hr tablet Take 1 tablet (50 mg total) by mouth daily. Take with or immediately following a meal.   polyethylene glycol (MIRALAX / GLYCOLAX) 17 g packet Mix 17 g with 8oz of water and take by mouth 2 (two) times daily. (Patient taking differently: Take 17 g by mouth 2 (two) times daily as needed (constipation).)   sertraline (ZOLOFT) 50 MG tablet Take 1 tablet (50 mg total) by mouth daily. (Patient  not taking: No sig reported)   [DISCONTINUED] amLODipine (NORVASC) 10 MG tablet Take 1 tablet (10 mg total) by mouth daily. (Patient taking differently: Take 10 mg by mouth in the morning.)   [DISCONTINUED] metoprolol succinate (TOPROL-XL) 25 MG 24 hr tablet Take 1 tablet (25 mg total) by mouth daily. (Patient not taking: Reported on 11/17/2020)    Past Medical History:  Diagnosis Date   Anemia    Asthma    well controlled   Complication of anesthesia    "takes alot for anesthesia to work"   Concussion    h/o   Depression    Diabetes ([place])    last A1c was 5.8 on 09-04-20   Dialysis patient ([place])    T, Th, and Sat   Family history of adverse reaction to anesthesia    son-became aggressive after surgery   GERD (gastroesophageal reflux disease)    occ no meds   Headache    migraines   Hypertension    Palpitations    Pneumonia    h/o   Renal disorder     Past Surgical History:  Procedure Laterality Date   CESAREAN SECTION N/A    x3   CHOLECYSTECTOMY     DIALYSIS/PERMA CATHETER INSERTION N/A 08/24/2020   Procedure: DIALYSIS/PERMA CATHETER INSERTION;  Surgeon: Algernon Huxley, MD;  Location: [facility];  Service: Cardiovascular;  Laterality: N/A;    Social History Social History   Tobacco Use   Smoking  status: Never   Smokeless tobacco: Never  Vaping Use   Vaping Use: Never used  Substance Use Topics   Alcohol use: Never   Drug use: Never    Family History Family History  Problem Relation Age of Onset   Diabetes Mother    Skin cancer Mother    Heart attack Father        died at ~91 years of age   Alcohol abuse Brother    Diabetes Brother     Allergies  Allergen Reactions   Statins     Other reaction(s): Other (See Comments) "redness and facial swelling"   Cholesterol Rash    Cholesterol medicine     REVIEW OF SYSTEMS (Negative unless checked)  Constitutional: '[]'$ Weight loss  '[]'$ Fever  '[]'$ Chills Cardiac: '[]'$ Chest pain   '[]'$ Chest pressure    '[]'$ Palpitations   '[]'$ Shortness of breath when laying flat   '[]'$ Shortness of breath with exertion. Vascular:  '[]'$ Pain in legs with walking   '[]'$ Pain in legs at rest  '[]'$ History of DVT   '[]'$ Phlebitis   '[]'$ Swelling in legs   '[]'$ Varicose veins   '[]'$ Non-healing ulcers Pulmonary:   '[]'$ Uses home oxygen   '[]'$ Productive cough   '[]'$ Hemoptysis   '[]'$ Wheeze  '[]'$ COPD   '[]'$ Asthma Neurologic:  '[]'$ Dizziness   '[]'$ Seizures   '[]'$ History of stroke   '[]'$ History of TIA  '[]'$ Aphasia   '[]'$ Vissual changes   '[]'$ Weakness or numbness in arm   '[]'$ Weakness or numbness in leg Musculoskeletal:   '[]'$ Joint swelling   '[]'$ Joint pain   '[]'$ Low back pain Hematologic:  '[]'$ Easy bruising  '[]'$ Easy bleeding   '[]'$ Hypercoagulable state   '[]'$ Anemic Gastrointestinal:  '[]'$ Diarrhea   '[]'$ Vomiting  '[]'$ Gastroesophageal reflux/heartburn   '[]'$ Difficulty swallowing. Genitourinary:  '[]'$ Chronic kidney disease   '[]'$ Difficult urination  '[]'$ Frequent urination   '[]'$ Blood in urine Skin:  '[]'$ Rashes   '[]'$ Ulcers  Psychological:  '[]'$ History of anxiety   '[]'$  History of major depression.  Physical Examination  Vitals:   11/20/20 1024  BP: (!) 161/92  Pulse: 92  Resp: 18  Temp: 98.3 F (36.8 C)  TempSrc: Oral  SpO2: 100%  Weight: 118.4 kg  Height: '5\' 2"'$  (1.575 m)   Body mass index is 47.74 kg/m. Gen: WD/WN, NAD Head: [place]/AT, No temporalis wasting.  Ear/Nose/Throat: Hearing grossly intact, nares w/o erythema or drainage Eyes: PER, EOMI, sclera nonicteric.  Neck: Supple, no large masses.   Pulmonary:  Good air movement, no audible wheezing bilaterally, no use of accessory muscles.  Cardiac: RRR, no JVD Vascular: right IJ cath CD&I Vessel Right Left  Radial Palpable Palpable  Brachial Palpable Palpable  Gastrointestinal: Non-distended. No guarding/no peritoneal signs.  Musculoskeletal: M/S 5/5 throughout.  No deformity or atrophy.  Neurologic: CN 2-12 intact. Symmetrical.  Speech is fluent. Motor exam as listed above. Psychiatric: Judgment intact, Mood & affect appropriate for pt's clinical  situation. Dermatologic: No rashes or ulcers noted.  No changes consistent with cellulitis. Lymph : No lichenification or skin changes of chronic lymphedema.  CBC Lab Results  Component Value Date   WBC 8.2 11/13/2020   HGB 10.2 (L) 11/20/2020   HCT 30.0 (L) 11/20/2020   MCV 81.8 11/13/2020   PLT 274 11/13/2020    BMET    Component Value Date/Time   NA 131 (L) 11/20/2020 1023   K 4.3 11/20/2020 1023   CL 98 11/20/2020 1023   CO2 25 11/13/2020 0826   GLUCOSE 317 (H) 11/20/2020 1023   BUN 28 (H) 11/20/2020 1023   CREATININE 6.90 (H) 11/20/2020 1023  CALCIUM 8.5 (L) 11/13/2020 0826   GFRNONAA 8 (L) 11/13/2020 0826   GFRAA 17 (L) 12/13/2019 0514   Estimated Creatinine Clearance: 12.6 mL/min (A) (by C-G formula based on SCr of 6.9 mg/dL (H)).  COAG Lab Results  Component Value Date   INR 1.0 11/13/2020    Radiology No results found.   Assessment/Plan 1. ESRD (end stage renal disease) ([place]) Recommend:   At this time the patient does not have appropriate extremity access for dialysis   Patient should have a left brachial cephalic fistula created.   The risks, benefits and alternative therapies were reviewed in detail with the patient.  All questions were answered.  The patient agrees to proceed with surgery.     2. Essential hypertension Continue antihypertensive medications as already ordered, these medications have been reviewed and there are no changes at this time.     3. Type 2 diabetes mellitus with chronic kidney disease on chronic dialysis, unspecified whether long term insulin use ([place]) Continue hypoglycemic medications as already ordered, these medications have been reviewed and there are no changes at this time.   Hgb A1C to be monitored as already arranged by primary service     4. Hypertriglyceridemia Continue statin as ordered and reviewed, no changes at this time    [name], MD  11/20/2020 12:57 PM

## 2020-11-20 NOTE — Anesthesia Procedure Notes (Addendum)
Procedure Name: Intubation Date/Time: 11/20/2020 1:28 PM Performed by: Lia Foyer, CRNA Pre-anesthesia Checklist: Patient identified, Emergency Drugs available, Suction available and Patient being monitored Patient Re-evaluated:Patient Re-evaluated prior to induction Oxygen Delivery Method: Circle system utilized Preoxygenation: Pre-oxygenation with 100% oxygen Induction Type: IV induction Ventilation: Mask ventilation without difficulty Laryngoscope Size: McGraph and 3 Grade View: Grade I Tube type: Oral Number of attempts: 1 Airway Equipment and Method: Stylet and Video-laryngoscopy Placement Confirmation: ETT inserted through vocal cords under direct vision, positive ETCO2 and breath sounds checked- equal and bilateral Secured at: 20 cm Tube secured with: Tape Dental Injury: Teeth and Oropharynx as per pre-operative assessment

## 2020-11-20 NOTE — Discharge Instructions (Signed)

## 2020-11-20 NOTE — Interval H&P Note (Signed)
History and Physical Interval Note:  11/20/2020 1:15 PM  [name]  has presented today for surgery, with the diagnosis of ESRD.  The various methods of treatment have been discussed with the patient and family. After consideration of risks, benefits and other options for treatment, the patient has consented to  Procedure(s): ARTERIOVENOUS (AV) FISTULA CREATION ( BRACHIAL CEPHALIC ) (Left) as a surgical intervention.  The patient's history has been reviewed, patient examined, no change in status, stable for surgery.  I have reviewed the patient's chart and labs.  Questions were answered to the patient's satisfaction.     [name]

## 2020-11-23 ENCOUNTER — Encounter: Payer: Self-pay | Admitting: Vascular Surgery

## 2020-11-26 ENCOUNTER — Ambulatory Visit: Payer: Self-pay | Admitting: Licensed Clinical Social Worker

## 2020-11-26 ENCOUNTER — Other Ambulatory Visit: Payer: Self-pay

## 2020-11-26 ENCOUNTER — Institutional Professional Consult (permissible substitution): Payer: Self-pay | Admitting: Licensed Clinical Social Worker

## 2020-11-26 DIAGNOSIS — F410 Panic disorder [episodic paroxysmal anxiety] without agoraphobia: Secondary | ICD-10-CM

## 2020-11-26 DIAGNOSIS — F339 Major depressive disorder, recurrent, unspecified: Secondary | ICD-10-CM

## 2020-11-26 DIAGNOSIS — F411 Generalized anxiety disorder: Secondary | ICD-10-CM
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
## 2020-12-04 ENCOUNTER — Other Ambulatory Visit: Payer: Self-pay

## 2020-12-04 ENCOUNTER — Encounter: Payer: Self-pay | Admitting: Emergency Medicine

## 2020-12-04 ENCOUNTER — Inpatient Hospital Stay
Admission: EM | Admit: 2020-12-04 | Discharge: 2020-12-07 | DRG: 177 | Disposition: A | Discharge status: Home / Self Care | Payer: Medicaid Other | Attending: Internal Medicine | Admitting: Internal Medicine

## 2020-12-04 ENCOUNTER — Emergency Department: Payer: Medicaid Other

## 2020-12-04 DIAGNOSIS — G4730 Sleep apnea, unspecified: Secondary | ICD-10-CM | POA: Diagnosis present

## 2020-12-04 DIAGNOSIS — I120 Hypertensive chronic kidney disease with stage 5 chronic kidney disease or end stage renal disease: Secondary | ICD-10-CM | POA: Diagnosis present

## 2020-12-04 DIAGNOSIS — D631 Anemia in chronic kidney disease: Secondary | ICD-10-CM | POA: Diagnosis present

## 2020-12-04 DIAGNOSIS — Z808 Family history of malignant neoplasm of other organs or systems: Secondary | ICD-10-CM

## 2020-12-04 DIAGNOSIS — Z8249 Family history of ischemic heart disease and other diseases of the circulatory system: Secondary | ICD-10-CM

## 2020-12-04 DIAGNOSIS — R519 Headache, unspecified: Secondary | ICD-10-CM

## 2020-12-04 DIAGNOSIS — U071 COVID-19: Principal | ICD-10-CM | POA: Diagnosis present

## 2020-12-04 DIAGNOSIS — K219 Gastro-esophageal reflux disease without esophagitis: Secondary | ICD-10-CM | POA: Diagnosis present

## 2020-12-04 DIAGNOSIS — J189 Pneumonia, unspecified organism: Secondary | ICD-10-CM | POA: Diagnosis present

## 2020-12-04 DIAGNOSIS — N2581 Secondary hyperparathyroidism of renal origin: Secondary | ICD-10-CM | POA: Diagnosis present

## 2020-12-04 DIAGNOSIS — G4733 Obstructive sleep apnea (adult) (pediatric): Secondary | ICD-10-CM | POA: Diagnosis present

## 2020-12-04 DIAGNOSIS — E1122 Type 2 diabetes mellitus with diabetic chronic kidney disease: Secondary | ICD-10-CM | POA: Diagnosis present

## 2020-12-04 DIAGNOSIS — A419 Sepsis, unspecified organism: Secondary | ICD-10-CM

## 2020-12-04 DIAGNOSIS — N186 End stage renal disease: Secondary | ICD-10-CM

## 2020-12-04 DIAGNOSIS — E781 Pure hyperglyceridemia: Secondary | ICD-10-CM | POA: Diagnosis present

## 2020-12-04 DIAGNOSIS — Z888 Allergy status to other drugs, medicaments and biological substances status: Secondary | ICD-10-CM

## 2020-12-04 DIAGNOSIS — Z811 Family history of alcohol abuse and dependence: Secondary | ICD-10-CM

## 2020-12-04 DIAGNOSIS — I10 Essential (primary) hypertension: Secondary | ICD-10-CM | POA: Diagnosis present

## 2020-12-04 DIAGNOSIS — Z833 Family history of diabetes mellitus: Secondary | ICD-10-CM

## 2020-12-04 DIAGNOSIS — Z992 Dependence on renal dialysis: Secondary | ICD-10-CM

## 2020-12-04 DIAGNOSIS — Z79899 Other long term (current) drug therapy: Secondary | ICD-10-CM

## 2020-12-04 DIAGNOSIS — R509 Fever, unspecified: Secondary | ICD-10-CM | POA: Diagnosis present

## 2020-12-04 DIAGNOSIS — Z6841 Body Mass Index (BMI) 40.0 and over, adult: Secondary | ICD-10-CM

## 2020-12-04 DIAGNOSIS — Z9049 Acquired absence of other specified parts of digestive tract: Secondary | ICD-10-CM

## 2020-12-04 LAB — BASIC METABOLIC PANEL
Anion gap: 8 (ref 5–15)
BUN: 15 mg/dL (ref 6–20)
CO2: 22 mmol/L (ref 22–32)
Calcium: 8 mg/dL — ABNORMAL LOW (ref 8.9–10.3)
Chloride: 101 mmol/L (ref 98–111)
Creatinine, Ser: 4.81 mg/dL — ABNORMAL HIGH (ref 0.44–1.00)
GFR, Estimated: 11 mL/min — ABNORMAL LOW (ref >60)
Glucose, Bld: 183 mg/dL — ABNORMAL HIGH (ref 70–99)
Potassium: 4 mmol/L (ref 3.5–5.1)
Sodium: 131 mmol/L — ABNORMAL LOW (ref 135–145)

## 2020-12-04 LAB — LIPASE, BLOOD: Lipase: 36 U/L (ref 11–51)

## 2020-12-04 LAB — HEPATIC FUNCTION PANEL
ALT: 12 U/L (ref 0–44)
AST: 21 U/L (ref 15–41)
Albumin: 3.1 g/dL — ABNORMAL LOW (ref 3.5–5.0)
Alkaline Phosphatase: 106 U/L (ref 38–126)
Bilirubin, Direct: <0.1 mg/dL (ref 0.0–0.2)
Total Bilirubin: 0.6 mg/dL (ref 0.3–1.2)
Total Protein: 6.7 g/dL (ref 6.5–8.1)

## 2020-12-04 LAB — CBC
HCT: 30.2 % — ABNORMAL LOW (ref 36.0–46.0)
Hemoglobin: 10.4 g/dL — ABNORMAL LOW (ref 12.0–15.0)
MCH: 30.2 pg (ref 26.0–34.0)
MCHC: 34.4 g/dL (ref 30.0–36.0)
MCV: 87.8 fL (ref 80.0–100.0)
Platelets: 273 10*3/uL (ref 150–400)
RBC: 3.44 MIL/uL — ABNORMAL LOW (ref 3.87–5.11)
RDW: 13.2 % (ref 11.5–15.5)
WBC: 9.6 10*3/uL (ref 4.0–10.5)
nRBC: 0 % (ref 0.0–0.2)

## 2020-12-04 LAB — TROPONIN I (HIGH SENSITIVITY)
Troponin I (High Sensitivity): 9 ng/L (ref <18)
Troponin I (High Sensitivity): 10 ng/L (ref <18)

## 2020-12-04 LAB — HCG, QUANTITATIVE, PREGNANCY: hCG, Beta Chain, Quant, S: 2 m[IU]/mL (ref <5)

## 2020-12-04 LAB — POC URINE PREG, ED: Preg Test, Ur: NEGATIVE

## 2020-12-04 MED ORDER — ACETAMINOPHEN 500 MG PO TABS
1000.0000 mg | ORAL_TABLET | ORAL | Status: AC
  Administered 2020-12-04: 1000 mg via ORAL
  Filled 2020-12-04: qty 2

## 2020-12-04 MED ORDER — VANCOMYCIN HCL IN DEXTROSE 1-5 GM/200ML-% IV SOLN
1000.0000 mg | Freq: Once | INTRAVENOUS | Status: DC
  Administered 2020-12-05: 1000 mg via INTRAVENOUS
  Filled 2020-12-04: qty 200

## 2020-12-04 MED ORDER — ACETAMINOPHEN 325 MG PO TABS
650.0000 mg | ORAL_TABLET | Freq: Once | ORAL | Status: AC
  Administered 2020-12-04: 650 mg via ORAL
  Filled 2020-12-04: qty 2

## 2020-12-04 MED ORDER — ONDANSETRON 4 MG PO TBDP
4.0000 mg | ORAL_TABLET | Freq: Once | ORAL | Status: AC
  Administered 2020-12-04: 4 mg via ORAL

## 2020-12-04 MED ORDER — ONDANSETRON HCL 4 MG/2ML IJ SOLN
4.0000 mg | INTRAMUSCULAR | Status: AC
  Administered 2020-12-04: 4 mg via INTRAVENOUS
  Filled 2020-12-04: qty 2

## 2020-12-04 MED ORDER — CLONIDINE HCL 0.1 MG PO TABS
0.1000 mg | ORAL_TABLET | Freq: Once | ORAL | Status: AC
  Administered 2020-12-04: 0.1 mg via ORAL
  Filled 2020-12-04: qty 1

## 2020-12-04 MED ORDER — SODIUM CHLORIDE 0.9 % IV SOLN
2.0000 g | Freq: Once | INTRAVENOUS | Status: AC
  Administered 2020-12-05: 2 g via INTRAVENOUS
  Filled 2020-12-04: qty 20

## 2020-12-04 MED ORDER — ONDANSETRON 4 MG PO TBDP
ORAL_TABLET | ORAL | Status: AC
  Filled 2020-12-04: qty 1

## 2020-12-04 MED ORDER — MORPHINE SULFATE (PF) 4 MG/ML IV SOLN
4.0000 mg | Freq: Once | INTRAVENOUS | Status: AC
Start: 2020-12-04 — End: 2020-12-04
  Administered 2020-12-04: 4 mg via INTRAVENOUS
  Filled 2020-12-04: qty 1

## 2020-12-04 NOTE — ED Notes (Signed)
Pt reports she does dialysis M/W?F received her treatment today and after dialysis she started to feel bad, her fistula was placed 3 weeks ago.

## 2020-12-04 NOTE — ED Notes (Signed)
Pt is ambulatory to bedside commode but is still very nauseous and is dry heaving

## 2020-12-04 NOTE — ED Notes (Signed)
Pt has returned to room from CT  

## 2020-12-04 NOTE — Progress Notes (Signed)
PHARMACY -  BRIEF ANTIBIOTIC NOTE   Pharmacy has received consult(s) for vancomycin from an ED provider.  The patient's profile has been reviewed for ht/wt/allergies/indication/available labs.    One time order(s) placed for vancomycin 1g IV  Further antibiotics/pharmacy consults should be ordered by admitting physician if indicated.                       Thank you, Forde Dandy [facility] 12/04/2020  10:37 PM

## 2020-12-04 NOTE — ED Triage Notes (Signed)
Pt reports went to dialysis today but got sick and was not able to complete her dialysis. Pt not sure how much was completed. Pt also reports HA and nausea.

## 2020-12-05 ENCOUNTER — Inpatient Hospital Stay
Admit: 2020-12-05 | Discharge: 2020-12-05 | Disposition: A | Discharge status: Home / Self Care | Payer: Medicaid Other | Attending: Internal Medicine | Admitting: Internal Medicine

## 2020-12-05 ENCOUNTER — Encounter: Payer: Self-pay | Admitting: Internal Medicine

## 2020-12-05 DIAGNOSIS — E781 Pure hyperglyceridemia: Secondary | ICD-10-CM | POA: Diagnosis present

## 2020-12-05 DIAGNOSIS — Z992 Dependence on renal dialysis: Secondary | ICD-10-CM | POA: Diagnosis not present

## 2020-12-05 DIAGNOSIS — Z888 Allergy status to other drugs, medicaments and biological substances status: Secondary | ICD-10-CM | POA: Diagnosis not present

## 2020-12-05 DIAGNOSIS — U071 COVID-19: Secondary | ICD-10-CM | POA: Diagnosis present

## 2020-12-05 DIAGNOSIS — Z6841 Body Mass Index (BMI) 40.0 and over, adult: Secondary | ICD-10-CM | POA: Diagnosis not present

## 2020-12-05 DIAGNOSIS — G4733 Obstructive sleep apnea (adult) (pediatric): Secondary | ICD-10-CM

## 2020-12-05 DIAGNOSIS — N2581 Secondary hyperparathyroidism of renal origin: Secondary | ICD-10-CM | POA: Diagnosis present

## 2020-12-05 DIAGNOSIS — Z79899 Other long term (current) drug therapy: Secondary | ICD-10-CM | POA: Diagnosis not present

## 2020-12-05 DIAGNOSIS — R739 Hyperglycemia, unspecified: Secondary | ICD-10-CM | POA: Insufficient documentation

## 2020-12-05 DIAGNOSIS — J189 Pneumonia, unspecified organism: Secondary | ICD-10-CM | POA: Diagnosis present

## 2020-12-05 DIAGNOSIS — N186 End stage renal disease: Secondary | ICD-10-CM

## 2020-12-05 DIAGNOSIS — R0602 Shortness of breath: Secondary | ICD-10-CM | POA: Diagnosis present

## 2020-12-05 DIAGNOSIS — Z833 Family history of diabetes mellitus: Secondary | ICD-10-CM | POA: Diagnosis not present

## 2020-12-05 DIAGNOSIS — I10 Essential (primary) hypertension: Secondary | ICD-10-CM

## 2020-12-05 DIAGNOSIS — R509 Fever, unspecified: Secondary | ICD-10-CM | POA: Diagnosis present

## 2020-12-05 DIAGNOSIS — I120 Hypertensive chronic kidney disease with stage 5 chronic kidney disease or end stage renal disease: Secondary | ICD-10-CM | POA: Diagnosis present

## 2020-12-05 DIAGNOSIS — E1122 Type 2 diabetes mellitus with diabetic chronic kidney disease: Secondary | ICD-10-CM | POA: Diagnosis present

## 2020-12-05 DIAGNOSIS — Z8249 Family history of ischemic heart disease and other diseases of the circulatory system: Secondary | ICD-10-CM | POA: Diagnosis not present

## 2020-12-05 DIAGNOSIS — Z9049 Acquired absence of other specified parts of digestive tract: Secondary | ICD-10-CM | POA: Diagnosis not present

## 2020-12-05 DIAGNOSIS — Z811 Family history of alcohol abuse and dependence: Secondary | ICD-10-CM | POA: Diagnosis not present

## 2020-12-05 DIAGNOSIS — D631 Anemia in chronic kidney disease: Secondary | ICD-10-CM | POA: Diagnosis present

## 2020-12-05 DIAGNOSIS — Z808 Family history of malignant neoplasm of other organs or systems: Secondary | ICD-10-CM | POA: Diagnosis not present

## 2020-12-05 DIAGNOSIS — K219 Gastro-esophageal reflux disease without esophagitis: Secondary | ICD-10-CM | POA: Diagnosis present

## 2020-12-05 LAB — C-REACTIVE PROTEIN: CRP: 1 mg/dL — ABNORMAL HIGH (ref <1.0)

## 2020-12-05 LAB — CBG MONITORING, ED
Glucose-Capillary: 148 mg/dL — ABNORMAL HIGH (ref 70–99)
Glucose-Capillary: 183 mg/dL — ABNORMAL HIGH (ref 70–99)
Glucose-Capillary: 206 mg/dL — ABNORMAL HIGH (ref 70–99)
Glucose-Capillary: 253 mg/dL — ABNORMAL HIGH (ref 70–99)

## 2020-12-05 LAB — COMPREHENSIVE METABOLIC PANEL
ALT: 9 U/L (ref 0–44)
AST: 17 U/L (ref 15–41)
Albumin: 2.7 g/dL — ABNORMAL LOW (ref 3.5–5.0)
Alkaline Phosphatase: 94 U/L (ref 38–126)
Anion gap: 7 (ref 5–15)
BUN: 20 mg/dL (ref 6–20)
CO2: 24 mmol/L (ref 22–32)
Calcium: 7.6 mg/dL — ABNORMAL LOW (ref 8.9–10.3)
Chloride: 100 mmol/L (ref 98–111)
Creatinine, Ser: 5.9 mg/dL — ABNORMAL HIGH (ref 0.44–1.00)
GFR, Estimated: 8 mL/min — ABNORMAL LOW (ref >60)
Glucose, Bld: 210 mg/dL — ABNORMAL HIGH (ref 70–99)
Potassium: 4.3 mmol/L (ref 3.5–5.1)
Sodium: 131 mmol/L — ABNORMAL LOW (ref 135–145)
Total Bilirubin: 0.8 mg/dL (ref 0.3–1.2)
Total Protein: 5.9 g/dL — ABNORMAL LOW (ref 6.5–8.1)

## 2020-12-05 LAB — CBC
HCT: 27.9 % — ABNORMAL LOW (ref 36.0–46.0)
Hemoglobin: 9.3 g/dL — ABNORMAL LOW (ref 12.0–15.0)
MCH: 29.4 pg (ref 26.0–34.0)
MCHC: 33.3 g/dL (ref 30.0–36.0)
MCV: 88.3 fL (ref 80.0–100.0)
Platelets: 239 10*3/uL (ref 150–400)
RBC: 3.16 MIL/uL — ABNORMAL LOW (ref 3.87–5.11)
RDW: 13.2 % (ref 11.5–15.5)
WBC: 6.4 10*3/uL (ref 4.0–10.5)
nRBC: 0 % (ref 0.0–0.2)

## 2020-12-05 LAB — URINALYSIS, COMPLETE (UACMP) WITH MICROSCOPIC
Bilirubin Urine: NEGATIVE
Glucose, UA: >=500 mg/dL — AB
Hgb urine dipstick: NEGATIVE
Ketones, ur: NEGATIVE mg/dL
Leukocytes,Ua: NEGATIVE
Nitrite: NEGATIVE
Protein, ur: >=300 mg/dL — AB
Specific Gravity, Urine: 1.023 (ref 1.005–1.030)
pH: 8 (ref 5.0–8.0)

## 2020-12-05 LAB — RESP PANEL BY RT-PCR (FLU A&B, COVID) ARPGX2
Influenza A by PCR: NEGATIVE
Influenza B by PCR: NEGATIVE
SARS Coronavirus 2 by RT PCR: POSITIVE — AB

## 2020-12-05 MED ORDER — VANCOMYCIN HCL IN DEXTROSE 1-5 GM/200ML-% IV SOLN: 1000.0000 mg | INTRAVENOUS | Status: DC

## 2020-12-05 MED ORDER — SODIUM CHLORIDE 0.9 % IV SOLN
1.0000 g | INTRAVENOUS | Status: DC
  Administered 2020-12-06: 1 g via INTRAVENOUS
  Filled 2020-12-05 (×2): qty 10

## 2020-12-05 MED ORDER — HEPARIN SODIUM (PORCINE) 5000 UNIT/ML IJ SOLN
5000.0000 [IU] | Freq: Three times a day (TID) | INTRAMUSCULAR | Status: DC
  Administered 2020-12-05 – 2020-12-07 (×9): 5000 [IU] via SUBCUTANEOUS
  Filled 2020-12-05 (×9): qty 1

## 2020-12-05 MED ORDER — HYDROCHLOROTHIAZIDE 25 MG PO TABS
25.0000 mg | ORAL_TABLET | Freq: Every day | ORAL | Status: DC
  Administered 2020-12-06: 25 mg via ORAL
  Filled 2020-12-05: qty 1

## 2020-12-05 MED ORDER — VANCOMYCIN HCL 1500 MG/300ML IV SOLN
1500.0000 mg | Freq: Once | INTRAVENOUS | Status: AC
  Administered 2020-12-05: 1500 mg via INTRAVENOUS
  Filled 2020-12-05: qty 300

## 2020-12-05 MED ORDER — ACETAMINOPHEN 325 MG PO TABS
650.0000 mg | ORAL_TABLET | Freq: Four times a day (QID) | ORAL | Status: DC | PRN
  Administered 2020-12-05 – 2020-12-06 (×3): 650 mg via ORAL
  Filled 2020-12-05 (×4): qty 2

## 2020-12-05 MED ORDER — ACETAMINOPHEN 650 MG RE SUPP: 650.0000 mg | Freq: Four times a day (QID) | RECTAL | Status: DC | PRN

## 2020-12-05 MED ORDER — ONDANSETRON HCL 4 MG/2ML IJ SOLN: 4.0000 mg | Freq: Four times a day (QID) | INTRAMUSCULAR | Status: DC | PRN

## 2020-12-05 MED ORDER — SODIUM CHLORIDE 0.9 % IV SOLN
100.0000 mg | Freq: Every day | INTRAVENOUS | Status: DC
  Administered 2020-12-06 – 2020-12-07 (×2): 100 mg via INTRAVENOUS
  Filled 2020-12-05: qty 100
  Filled 2020-12-05: qty 20
  Filled 2020-12-05: qty 100
  Filled 2020-12-05: qty 20

## 2020-12-05 MED ORDER — VANCOMYCIN HCL 500 MG/100ML IV SOLN
500.0000 mg | Freq: Once | INTRAVENOUS | Status: DC
  Filled 2020-12-05: qty 100

## 2020-12-05 MED ORDER — AMLODIPINE BESYLATE 10 MG PO TABS
10.0000 mg | ORAL_TABLET | Freq: Every day | ORAL | Status: DC
  Administered 2020-12-05 – 2020-12-06 (×2): 10 mg via ORAL
  Filled 2020-12-05 (×2): qty 2

## 2020-12-05 MED ORDER — SODIUM CHLORIDE 0.9 % IV SOLN
200.0000 mg | Freq: Once | INTRAVENOUS | Status: AC
  Administered 2020-12-05: 200 mg via INTRAVENOUS
  Filled 2020-12-05: qty 40

## 2020-12-05 MED ORDER — DIPHENHYDRAMINE HCL 25 MG PO CAPS
25.0000 mg | ORAL_CAPSULE | Freq: Four times a day (QID) | ORAL | Status: DC | PRN
  Administered 2020-12-06 – 2020-12-07 (×3): 25 mg via ORAL
  Filled 2020-12-05 (×3): qty 1

## 2020-12-05 MED ORDER — HYDRALAZINE HCL 20 MG/ML IJ SOLN
10.0000 mg | Freq: Once | INTRAMUSCULAR | Status: AC
  Administered 2020-12-05: 10 mg via INTRAVENOUS
  Filled 2020-12-05: qty 1

## 2020-12-05 MED ORDER — VANCOMYCIN HCL 2000 MG/400ML IV SOLN
2000.0000 mg | Freq: Once | INTRAVENOUS | Status: DC
  Filled 2020-12-05: qty 400

## 2020-12-05 MED ORDER — MORPHINE SULFATE (PF) 2 MG/ML IV SOLN
2.0000 mg | Freq: Once | INTRAVENOUS | Status: AC
  Administered 2020-12-05: 2 mg via INTRAVENOUS
  Filled 2020-12-05: qty 1

## 2020-12-05 MED ORDER — ONDANSETRON HCL 4 MG PO TABS: 4.0000 mg | ORAL_TABLET | Freq: Four times a day (QID) | ORAL | Status: DC | PRN

## 2020-12-05 MED ORDER — INSULIN ASPART 100 UNIT/ML IJ SOLN
0.0000 [IU] | Freq: Three times a day (TID) | INTRAMUSCULAR | Status: DC
  Administered 2020-12-05: 1 [IU] via SUBCUTANEOUS
  Administered 2020-12-05: 5 [IU] via SUBCUTANEOUS
  Administered 2020-12-05 – 2020-12-06 (×2): 2 [IU] via SUBCUTANEOUS
  Administered 2020-12-06: 1 [IU] via SUBCUTANEOUS
  Administered 2020-12-06 – 2020-12-07 (×3): 2 [IU] via SUBCUTANEOUS
  Filled 2020-12-05 (×8): qty 1

## 2020-12-05 MED ORDER — ASPIRIN EC 81 MG PO TBEC
81.0000 mg | DELAYED_RELEASE_TABLET | Freq: Every day | ORAL | Status: DC
  Administered 2020-12-05 – 2020-12-07 (×3): 81 mg via ORAL
  Filled 2020-12-05 (×3): qty 1

## 2020-12-05 NOTE — ED Notes (Signed)
The patient is requesting something for pain. Dr. Prudy Feeler informed via epic secure chat.

## 2020-12-05 NOTE — ED Notes (Signed)
Pt ambulatory to and from bedside commode with independent steady gait. Warm blanket given to her.

## 2020-12-05 NOTE — ED Notes (Signed)
Patient set up with dinner tray.

## 2020-12-05 NOTE — ED Notes (Signed)
Pt given breakfast tray

## 2020-12-05 NOTE — ED Notes (Signed)
The patient is requesting a medication, specifically benadryl, for her stuffy nose. This RN also requested a PRN BP medication be added due to continued systolic BPs over [id number]. Dr. Prudy Feeler informed via epic secure chat.

## 2020-12-05 NOTE — Progress Notes (Signed)
PROGRESS NOTE    [name]  [id number] DOB: Mar 11, 1976 DOA: 12/04/2020 PCP: System, Provider Not In  Brief Narrative: 45 year old female with history of ESRD on hemodialysis M WF, type 2 diabetes mellitus, morbid obesity, obstructive sleep apnea, hypertension, anemia of chronic disease presented to the ED from dialysis with headache fever chills vomiting and congestion, associated with body aches In the ED she was febrile. -CT chest noted focal region of consolidation versus masslike opacity present in the superior segment right lower lobe, favor to be an acute infectious, Asymmetric left perinephric stranding  Assessment & Plan:   SARS COVID-19 infection -Symptomatic with myalgias cough congestion, given multiple risk factors will treat with IV remdesivir -No indication for steroids at this time, airborne precautions -Right lower lobe consolidation versus masslike opacity on CT chest is not consistent with COVID-pneumonia  Abnormal CT chest, right lower lobe consolidation versus masslike opacity -This does not correspond with COVID pneumonia -Favored to be infectious -Continue IV vancomycin and cefepime today, follow-up cultures -De-escalate soon, will need follow-up imaging in 6 weeks  Left perinephric stranding -No clear symptoms of UTI, check urinalysis and culture  ESRD on hemodialysis -Nephrology consulted, next HD is due on Monday  Anemia of chronic disease  Type 2 diabetes mellitus -CBGs are stable, sliding scale insulin  Hypertension -Add amlodipine  DVT prophylaxis: Heparin subcutaneous Code Status: Full code Family Communication: No family at bedside Disposition Plan:  Status is: Inpatient  Remains inpatient appropriate because:Inpatient level of care appropriate due to severity of illness  Dispo: The patient is from: Home              Anticipated d/c is to: Home              Patient currently is not medically stable to d/c.   Difficult to place  patient No   Consultants:  Nephrology  Procedures:   Antimicrobials:    Subjective: -Complains of body aches sore throat congestion, denies any dyspnea  Objective: Vitals:   12/05/20 1000 12/05/20 1100 12/05/20 1130 12/05/20 1158  BP: (!) 164/96 (!) 176/102 (!) 168/94   Pulse: 74 67 72   Resp:  15 14   Temp:    99.3 F (37.4 C)  TempSrc:    Oral  SpO2: 99% 100% 97%   Weight:      Height:        Intake/Output Summary (Last 24 hours) at 12/05/2020 1233 Last data filed at 12/05/2020 [id number] Gross per 24 hour  Intake 600 ml  Output --  Net 600 ml   Filed Weights   12/04/20 1740  Weight: 119 kg    Examination:  General exam: Obese chronically ill female sitting up in bed, ill-appearing, AAOx3 HEENT: Neck obese unable to assess JVD, right IJ HD catheter noted CVS: S1-S2, regular rate rhythm Lungs: Clear bilaterally Abdomen: Soft, nontender bowel sounds present Extremities: Left arm AV fistula noted, no edema Psychiatry: Judgement and insight appear normal. Mood & affect appropriate.     Data Reviewed:   CBC: Recent Labs  Lab 12/04/20 1746 12/05/20 0451  WBC 9.6 6.4  HGB 10.4* 9.3*  HCT 30.2* 27.9*  MCV 87.8 88.3  PLT 273 [id number]   Basic Metabolic Panel: Recent Labs  Lab 12/04/20 1746 12/05/20 0451  NA 131* 131*  K 4.0 4.3  CL 101 100  CO2 22 24  GLUCOSE 183* 210*  BUN 15 20  CREATININE 4.81* 5.90*  CALCIUM 8.0* 7.6*   GFR: Estimated Creatinine Clearance:  14.8 mL/min (A) (by C-G formula based on SCr of 5.9 mg/dL (H)). Liver Function Tests: Recent Labs  Lab 12/04/20 2253 12/05/20 0451  AST 21 17  ALT 12 9  ALKPHOS 106 94  BILITOT 0.6 0.8  PROT 6.7 5.9*  ALBUMIN 3.1* 2.7*   Recent Labs  Lab 12/04/20 2253  LIPASE 36   No results for input(s): AMMONIA in the last 168 hours. Coagulation Profile: No results for input(s): INR, PROTIME in the last 168 hours. Cardiac Enzymes: No results for input(s): CKTOTAL, CKMB, CKMBINDEX, TROPONINI in  the last 168 hours. BNP (last 3 results) No results for input(s): PROBNP in the last 8760 hours. HbA1C: No results for input(s): HGBA1C in the last 72 hours. CBG: Recent Labs  Lab 12/05/20 0036 12/05/20 0814 12/05/20 1153  GLUCAP 206* 148* 183*   Lipid Profile: No results for input(s): CHOL, HDL, LDLCALC, TRIG, CHOLHDL, LDLDIRECT in the last 72 hours. Thyroid Function Tests: No results for input(s): TSH, T4TOTAL, FREET4, T3FREE, THYROIDAB in the last 72 hours. Anemia Panel: No results for input(s): VITAMINB12, FOLATE, FERRITIN, TIBC, IRON, RETICCTPCT in the last 72 hours. Urine analysis:    Component Value Date/Time   COLORURINE YELLOW (A) 12/05/2020 1030   APPEARANCEUR CLOUDY (A) 12/05/2020 1030   LABSPEC 1.023 12/05/2020 1030   PHURINE 8.0 12/05/2020 1030   GLUCOSEU >=500 (A) 12/05/2020 1030   HGBUR NEGATIVE 12/05/2020 1030   BILIRUBINUR NEGATIVE 12/05/2020 1030   KETONESUR NEGATIVE 12/05/2020 1030   PROTEINUR >=300 (A) 12/05/2020 1030   NITRITE NEGATIVE 12/05/2020 1030   LEUKOCYTESUR NEGATIVE 12/05/2020 1030   Sepsis Labs: '@LABRCNTIP'$ (procalcitonin:4,lacticidven:4)  ) Recent Results (from the past 240 hour(s))  Resp Panel by RT-PCR (Flu A&B, Covid) Nasopharyngeal Swab     Status: Abnormal   Collection Time: 12/04/20 10:53 PM   Specimen: Nasopharyngeal Swab; Nasopharyngeal(NP) swabs in vial transport medium  Result Value Ref Range Status   SARS Coronavirus 2 by RT PCR POSITIVE (A) NEGATIVE Final    Comment: RESULT CALLED TO, READ BACK BY AND VERIFIED WITH: MELANIE FOWLER @ [id number] 12/05/20 LFD (NOTE) SARS-CoV-2 target nucleic acids are DETECTED.  The SARS-CoV-2 RNA is generally detectable in upper respiratory specimens during the acute phase of infection. Positive results are indicative of the presence of the identified virus, but do not rule out bacterial infection or co-infection with other pathogens not detected by the test. Clinical correlation with patient  history and other diagnostic information is necessary to determine patient infection status. The expected result is Negative.  Fact Sheet for Patients: EntrepreneurPulse.com.au  Fact Sheet for Healthcare Providers: IncredibleEmployment.be  This test is not yet approved or cleared by the [place] FDA and  has been authorized for detection and/or diagnosis of SARS-CoV-2 by FDA under an Emergency Use Authorization (EUA).  This EUA will remain in effect (meaning this test can b e used) for the duration of  the COVID-19 declaration under Section 564(b)(1) of the Act, 21 U.S.C. section 360bbb-3(b)(1), unless the authorization is terminated or revoked sooner.     Influenza A by PCR NEGATIVE NEGATIVE Final   Influenza B by PCR NEGATIVE NEGATIVE Final    Comment: (NOTE) The Xpert Xpress SARS-CoV-2/FLU/RSV plus assay is intended as an aid in the diagnosis of influenza from Nasopharyngeal swab specimens and should not be used as a sole basis for treatment. Nasal washings and aspirates are unacceptable for Xpert Xpress SARS-CoV-2/FLU/RSV testing.  Fact Sheet for Patients: EntrepreneurPulse.com.au  Fact Sheet for Healthcare Providers: IncredibleEmployment.be  This test  is not yet approved or cleared by the [place] and has been authorized for detection and/or diagnosis of SARS-CoV-2 by FDA under an Emergency Use Authorization (EUA). This EUA will remain in effect (meaning this test can be used) for the duration of the COVID-19 declaration under Section 564(b)(1) of the Act, 21 U.S.C. section 360bbb-3(b)(1), unless the authorization is terminated or revoked.  Performed at [facility], [address] [place], [place] [address]   Culture, blood (Routine X 2) w Reflex to ID Panel     Status: None (Preliminary result)   Collection Time: 12/04/20 10:53 PM   Specimen: BLOOD  Result Value Ref  Range Status   Specimen Description BLOOD RIGHT ANTECUBITAL  Final   Special Requests   Final    BOTTLES DRAWN AEROBIC AND ANAEROBIC Blood Culture adequate volume   Culture   Final    NO GROWTH < 12 HOURS Performed at [facility], [address], [place], [place] [address]    Report Status PENDING  Incomplete  Culture, blood (Routine X 2) w Reflex to ID Panel     Status: None (Preliminary result)   Collection Time: 12/05/20 12:02 AM   Specimen: BLOOD  Result Value Ref Range Status   Specimen Description BLOOD RIGHT HAND  Final   Special Requests   Final    BOTTLES DRAWN AEROBIC AND ANAEROBIC Blood Culture results may not be optimal due to an inadequate volume of blood received in culture bottles   Culture   Final    NO GROWTH < 12 HOURS Performed at [facility], [address] [place], [place] [address]    Report Status PENDING  Incomplete         Radiology Studies: DG Chest 2 View  Result Date: 12/04/2020 CLINICAL DATA:  Shortness of breath.  Headache and nausea. EXAM: CHEST - 2 VIEW COMPARISON:  08/23/2020 FINDINGS: Right IJ dialysis catheter distal tip: SVC. No pneumothorax or complicating feature. Bibasilar subsegmental atelectasis or scarring, left greater than right. Thoracic spondylosis. Mild enlargement of the cardiopericardial silhouette, without edema. No blunting of the costophrenic angles. IMPRESSION: 1. Right IJ dialysis catheter tip: Lower SVC. 2. Bibasilar subsegmental atelectasis versus scarring. 3. Mild enlargement of the cardiopericardial silhouette, without edema. Electronically Signed   By: Van Clines M.D.   On: 12/04/2020 18:25   CT Head Wo Contrast  Result Date: 12/04/2020 CLINICAL DATA:  Headache, unable to complete dialysis EXAM: CT HEAD WITHOUT CONTRAST TECHNIQUE: Contiguous axial images were obtained from the base of the skull through the vertex without intravenous contrast. COMPARISON:  CT head 12/08/2019 FINDINGS: Brain: No  evidence of acute infarction, hemorrhage, hydrocephalus, extra-axial collection, visible mass lesion or mass effect. Vascular: No hyperdense vessel or unexpected calcification. Skull: No calvarial fracture or suspicious osseous lesion. No scalp swelling or hematoma. Sinuses/Orbits: Minimal mural thickening in ethmoids and left maxillary sinus. Paranasal sinuses and mastoid air cells are otherwise predominantly clear. Included orbital structures are unremarkable. Other: None IMPRESSION: No acute intracranial abnormality. Electronically Signed   By: Lovena Le M.D.   On: 12/04/2020 23:39   CT Chest Wo Contrast  Result Date: 12/04/2020 CLINICAL DATA:  Shortness of breath, headache, nausea, unable to complete dialysis EXAM: CT CHEST WITHOUT CONTRAST TECHNIQUE: Multidetector CT imaging of the chest was performed following the standard protocol without IV contrast. COMPARISON:  Radiograph 12/04/2020 FINDINGS: Cardiovascular: Mild cardiomegaly. Trace pericardial effusion. Suboptimal assessment of the vasculature in the absence of contrast media. No gross acute  aortic abnormality. Normal 3 vessel branching of the aortic arch. Central pulmonary arteries are normal caliber. Tunneled right IJ approach dual lumen dialysis catheter tip terminates at the level of the right atrium. Mediastinum/Nodes: No mediastinal fluid or gas. Normal thyroid gland and thoracic inlet. No acute abnormality of the trachea or esophagus. No worrisome pathologically enlarged mediastinal or axillary adenopathy. Shotty subcentimeter mediastinal nodes are low-attenuation and may be reactive or edematous in nature. Hilar nodal evaluation is limited in the absence of intravenous contrast media. Lungs/Pleura: There are features most suggestive of developing interstitial pulmonary edema with peribronchovascular cuffing, diffuse interlobular septal thickening and fissural thickening with pulmonary vascular redistribution/cephalization. There is however  a more focal rounded consolidative opacity seen in the superior segment of the left lower lobe measuring approximately 2.1 x 1.4 x 1.8 cm in size (3/75). Few small scattered sub 5 mm nodules subpleural nodules are seen elsewhere (3/88, 50, 35). No other focal airspace opacity is seen. Some bandlike areas. No pneumothorax. No layering pleural fluid. Upper Abdomen: Prior cholecystectomy. Asymmetric left perinephric stranding, nonspecific given underlying renal dysfunction. Musculoskeletal: No acute osseous abnormality or suspicious osseous lesion. No worrisome chest wall masses or lesions. Mild body wall edema. IMPRESSION: Focal region of consolidation versus masslike opacity present in the superior segment right lower lobe. Burtis Junes an acute infectious or inflammatory process though follow-up imaging should be considered in 6-8 weeks following therapeutic intervention to ensure resolution. Additional features of interstitial pulmonary edema throughout the lungs. With additional features of chronic overload including mild cardiomegaly and pericardial effusion as well as diffuse body wall edema. Asymmetric left perinephric stranding, nonspecific in the setting of underlying renal dysfunction/dialysis. Could correlate with urinalysis if patient is able to produce urine in order to exclude an ascending tract infection. Electronically Signed   By: Lovena Le M.D.   On: 12/04/2020 23:47    Scheduled Meds:  aspirin EC  81 mg Oral Daily   heparin  5,000 Units Subcutaneous Q8H   insulin aspart  0-9 Units Subcutaneous TID WC   Continuous Infusions:  [START ON 12/06/2020] cefTRIAXone (ROCEPHIN)  IV     [START ON 12/06/2020] remdesivir 100 mg in NS 100 mL     [START ON 12/07/2020] vancomycin       LOS: 0 days    Time spent: 27mn  PDomenic Polite MD [facility]   12/05/2020, 12:33 PM

## 2020-12-05 NOTE — Consult Note (Signed)
Remdesivir - Pharmacy Brief Note   O:  ALT: 9 CXR: "Bibasilar subsegmental atelectasis versus scarring" SpO2: Room air   A/P:  12/04/21 SARS-CoV-2 PCR (+)  Remdesivir 200 mg IVPB once followed by 100 mg IVPB daily x 4 days.   Benita Gutter  12/05/2020 8:08 AM

## 2020-12-05 NOTE — Progress Notes (Signed)
Pharmacy Antibiotic Note  [name] is a 45 y.o. female admitted on 12/04/2020 with sepsis.  Pharmacy has been consulted for Vancomycin dosing.   Pt was apparently on MWF HD schedule at home, did not complete HD session on 7/22.   Nephrology consulted for 7/23 AM ... will need to verify Vanc dosing  is correct once HD schedule is set.   Plan: Vancomycin 1 gm IV X 1 given in ED on 7/23 @ 0109 Additional Vanc 1500 mg IV X 1 ordered to make total loading dose of 2500 mg.   Vancomycin 1 gm IV MWF - HD ordered to start on 7/25.   Height: '5\' 2"'$  (157.5 cm) Weight: 119 kg (262 lb 5.6 oz) IBW/kg (Calculated) : 50.1  Temp (24hrs), Avg:100.5 F (38.1 C), Min:100 F (37.8 C), Max:100.9 F (38.3 C)  Recent Labs  Lab 12/04/20 1746  WBC 9.6  CREATININE 4.81*    Estimated Creatinine Clearance: 18.1 mL/min (A) (by C-G formula based on SCr of 4.81 mg/dL (H)).    Allergies  Allergen Reactions   Statins     Other reaction(s): Other (See Comments) "redness and facial swelling"   Cholesterol Rash    Cholesterol medicine    Antimicrobials this admission:   >>    >>   Dose adjustments this admission:   Microbiology results:  BCx:   UCx:    Sputum:    MRSA PCR:   Thank you for allowing pharmacy to be a part of this patient's care.  [name] D 12/05/2020 1:21 AM

## 2020-12-05 NOTE — H&P (Addendum)
History and Physical    Torre Pikus ZOX:[id number] DOB: [birth year] DOA: 12/04/2020  PCP: System, Provider Not In    Patient coming from:  Home    Chief Complaint:  Fever/ Headache / Vomiting.   HPI: [name] is a 45 y.o. female seen in ed with complaints of chills , body was cold, tylenol went to HD today  had headache during dialysis , body was weak and headache from front to back of head. At 1 pm she also has chills she stopped dialysis and feel weak. Feel pain in he back low back and has nausea and vomiting when she was waiting outside the dialysis office. Pt was very weak. Pt is a poor historian. Vision is normal. Pt has sob.no diarrhea. No other complaints hpi limited because of patient.  Patient is a little disoriented which per ED physician was not the case prior to the morphine.  Pt has past medical history of morbid obesity, diabetes mellitus type 2, end-stage renal disease on hemodialysis, obstructive sleep apnea, hypertension.  ED Course:  Vitals:   12/04/20 1743 12/04/20 1949 12/04/20 2304 12/05/20 0000  BP:  (!) 167/112 (!) 140/114 (!) 219/97  Pulse:  89  86  Resp:  (!) 22  19  Temp: 100 F (37.8 C) (!) 100.9 F (38.3 C)    TempSrc: Oral Oral    SpO2:  97%  95%  Weight:      Height:      Patient is easily aroused, disoriented, gives history briefly, febrile with a temp of 100.9, hypertensive, respirations of 22, patient meets sepsis criteria.  CMP shows sodium of 131 glucose of 183 and creatinine of 4.81, CBC shows white count of 9.6 hemoglobin of 10.4, platelets of 273.  CT angio of the chest shows consolidation versus opacity and superior segment of the right lower lobe concerning for infection, pulmonary edema, asymmetric left perinephric stranding which could be urinary tract infection in this case.   Review of Systems:  Review of Systems  Constitutional:  Positive for chills, fever and malaise/fatigue.  Eyes:  Negative for blurred vision, double  vision, photophobia, pain, discharge and redness.  Respiratory:  Positive for sputum production and shortness of breath. Negative for cough.   Cardiovascular:  Negative for chest pain.  Musculoskeletal:  Positive for myalgias.  Neurological:  Positive for weakness and headaches.    Past Medical History:  Diagnosis Date   Anemia    Asthma    well controlled   Complication of anesthesia    "takes alot for anesthesia to work"   Concussion    h/o   Depression    Diabetes ([place])    last A1c was 5.8 on 09-04-20   Dialysis patient ([place])    T, Th, and Sat   Family history of adverse reaction to anesthesia    son-became aggressive after surgery   GERD (gastroesophageal reflux disease)    occ no meds   Headache    migraines   Hypertension    Palpitations    Pneumonia    h/o   Renal disorder     Past Surgical History:  Procedure Laterality Date   AV FISTULA PLACEMENT Left 11/20/2020   Procedure: ARTERIOVENOUS (AV) FISTULA CREATION ( BRACHIAL CEPHALIC );  Surgeon: Katha Cabal, MD;  Location: [facility] ORS;  Service: Vascular;  Laterality: Left;   CESAREAN SECTION N/A    x3   CHOLECYSTECTOMY     DIALYSIS/PERMA CATHETER INSERTION N/A 08/24/2020   Procedure: DIALYSIS/PERMA CATHETER  INSERTION;  Surgeon: Algernon Huxley, MD;  Location: [place] [facility];  Service: Cardiovascular;  Laterality: N/A;     reports that she has never smoked. She has never used smokeless tobacco. She reports that she does not drink alcohol and does not use drugs.  Allergies  Allergen Reactions   Statins     Other reaction(s): Other (See Comments) "redness and facial swelling"   Cholesterol Rash    Cholesterol medicine    Family History  Problem Relation Age of Onset   Diabetes Mother    Skin cancer Mother    Heart attack Father        died at ~91 years of age   Alcohol abuse Brother    Diabetes Brother     Prior to Admission medications   Medication Sig Start Date End Date Taking?  Authorizing Provider  acetaminophen (TYLENOL) 500 MG tablet Take 500-1,000 mg by mouth every 6 (six) hours as needed (pain).    [provider]  amLODipine (NORVASC) 10 MG tablet Take 1 tablet (10 mg total) by mouth daily. Patient not taking: Reported on 11/20/2020 11/17/20   Iloabachie, Chioma E, NP  Blood Pressure KIT USE AS DIRECTED. 11/17/20   Iloabachie, Chioma E, NP  CALCITRIOL IV Inject 2 mcg into the vein 3 (three) times a week.    [provider]  calcium carbonate (TUMS - DOSED IN MG ELEMENTAL CALCIUM) 500 MG chewable tablet Chew 1 tablet (200 mg of elemental calcium total) by mouth 3 (three) times daily with meals. Patient taking differently: Chew 1 tablet by mouth 2 (two) times daily. 09/01/20   Bonnielee Haff, MD  diphenhydrAMINE (BENADRYL) 25 MG tablet Take 25 mg by mouth every 6 (six) hours as needed. Taking as needed    [provider]  epoetin alfa (EPOGEN) 10000 UNIT/ML injection Inject 1,400 Units into the vein 3 (three) times a week.    [provider]  HYDROcodone-acetaminophen (NORCO) 5-325 MG tablet Take 1 tablet by mouth every 6 (six) hours as needed for moderate pain. 11/20/20   Schnier, Dolores Lory, MD  Iron Sucrose (VENOFER IV) Inject 100 mg into the vein 3 (three) times a week.    [provider]  metoprolol succinate (TOPROL-XL) 50 MG 24 hr tablet Take 1 tablet (50 mg total) by mouth daily. Take with or immediately following a meal. 11/17/20   Iloabachie, Chioma E, NP  polyethylene glycol (MIRALAX / GLYCOLAX) 17 g packet Mix 17 g with 8oz of water and take by mouth 2 (two) times daily. Patient taking differently: Take 17 g by mouth 2 (two) times daily as needed (constipation). 09/02/20   Max Sane, MD  sertraline (ZOLOFT) 50 MG tablet Take 1 tablet (50 mg total) by mouth daily. Patient not taking: No sig reported 09/01/20   Bonnielee Haff, MD    Physical Exam: Vitals:   12/04/20 1743 12/04/20 1949 12/04/20 2304 12/05/20 0000  BP:   (!) 167/112 (!) 140/114 (!) 219/97  Pulse:  89  86  Resp:  (!) 22  19  Temp: 100 F (37.8 C) (!) 100.9 F (38.3 C)    TempSrc: Oral Oral    SpO2:  97%  95%  Weight:      Height:       Physical Exam Vitals reviewed.  Constitutional:      General: She is not in acute distress.    Appearance: She is obese. She is ill-appearing.  HENT:     Right Ear:  External ear normal.     Left Ear: External ear normal.     Mouth/Throat:     Mouth: Mucous membranes are dry.  Eyes:     Extraocular Movements: Extraocular movements intact.     Pupils: Pupils are equal, round, and reactive to light.  Cardiovascular:     Rate and Rhythm: Normal rate and regular rhythm.     Pulses: Normal pulses.     Heart sounds: Normal heart sounds.  Pulmonary:     Effort: Pulmonary effort is normal.     Breath sounds: Normal breath sounds. No wheezing.  Abdominal:     General: Bowel sounds are normal. There is no distension.     Palpations: Abdomen is soft. There is no mass.     Tenderness: There is no abdominal tenderness. There is no guarding.     Hernia: No hernia is present.  Musculoskeletal:        General: No swelling or deformity.     Right lower leg: No edema.     Left lower leg: No edema.  Skin:    General: Skin is warm.  Neurological:     General: No focal deficit present.     Mental Status: She is easily aroused. She is disoriented.  Psychiatric:        Mood and Affect: Mood normal.        Behavior: Behavior normal.     Labs on Admission: I have personally reviewed following labs and imaging studies  No results for input(s): CKTOTAL, CKMB, TROPONINI in the last 72 hours. Lab Results  Component Value Date   WBC 9.6 12/04/2020   HGB 10.4 (L) 12/04/2020   HCT 30.2 (L) 12/04/2020   MCV 87.8 12/04/2020   PLT 273 12/04/2020    Recent Labs  Lab 12/04/20 1746 12/04/20 2253  NA 131*  --   K 4.0  --   CL 101  --   CO2 22  --   BUN 15  --   CREATININE 4.81*  --   CALCIUM 8.0*  --    PROT  --  6.7  BILITOT  --  0.6  ALKPHOS  --  106  ALT  --  12  AST  --  21  GLUCOSE 183*  --    No results found for: CHOL, HDL, LDLCALC, TRIG No results found for: DDIMER Invalid input(s): POCBNP  Urinalysis    Component Value Date/Time   COLORURINE STRAW (A) 12/12/2019 2254   APPEARANCEUR CLEAR (A) 12/12/2019 2254   LABSPEC 1.008 12/12/2019 2254   PHURINE 7.0 12/12/2019 2254   GLUCOSEU >=500 (A) 12/12/2019 2254   HGBUR LARGE (A) 12/12/2019 2254   BILIRUBINUR NEGATIVE 12/12/2019 2254   KETONESUR NEGATIVE 12/12/2019 2254   PROTEINUR >=300 (A) 12/12/2019 2254   NITRITE NEGATIVE 12/12/2019 2254   LEUKOCYTESUR NEGATIVE 12/12/2019 2254    COVID-19 Labs  No results for input(s): DDIMER, FERRITIN, LDH, CRP in the last 72 hours.  Lab Results  Component Value Date   [place] NEGATIVE 08/23/2020   [place] NEGATIVE 12/09/2019    Radiological Exams on Admission: DG Chest 2 View  Result Date: 12/04/2020 CLINICAL DATA:  Shortness of breath.  Headache and nausea. EXAM: CHEST - 2 VIEW COMPARISON:  08/23/2020 FINDINGS: Right IJ dialysis catheter distal tip: SVC. No pneumothorax or complicating feature. Bibasilar subsegmental atelectasis or scarring, left greater than right. Thoracic spondylosis. Mild enlargement of the cardiopericardial silhouette, without edema. No blunting of the costophrenic angles. IMPRESSION: 1. Right  IJ dialysis catheter tip: Lower SVC. 2. Bibasilar subsegmental atelectasis versus scarring. 3. Mild enlargement of the cardiopericardial silhouette, without edema. Electronically Signed   By: Van Clines M.D.   On: 12/04/2020 18:25   CT Head Wo Contrast  Result Date: 12/04/2020 CLINICAL DATA:  Headache, unable to complete dialysis EXAM: CT HEAD WITHOUT CONTRAST TECHNIQUE: Contiguous axial images were obtained from the base of the skull through the vertex without intravenous contrast. COMPARISON:  CT head 12/08/2019 FINDINGS: Brain: No evidence of acute  infarction, hemorrhage, hydrocephalus, extra-axial collection, visible mass lesion or mass effect. Vascular: No hyperdense vessel or unexpected calcification. Skull: No calvarial fracture or suspicious osseous lesion. No scalp swelling or hematoma. Sinuses/Orbits: Minimal mural thickening in ethmoids and left maxillary sinus. Paranasal sinuses and mastoid air cells are otherwise predominantly clear. Included orbital structures are unremarkable. Other: None IMPRESSION: No acute intracranial abnormality. Electronically Signed   By: Lovena Le M.D.   On: 12/04/2020 23:39   CT Chest Wo Contrast  Result Date: 12/04/2020 CLINICAL DATA:  Shortness of breath, headache, nausea, unable to complete dialysis EXAM: CT CHEST WITHOUT CONTRAST TECHNIQUE: Multidetector CT imaging of the chest was performed following the standard protocol without IV contrast. COMPARISON:  Radiograph 12/04/2020 FINDINGS: Cardiovascular: Mild cardiomegaly. Trace pericardial effusion. Suboptimal assessment of the vasculature in the absence of contrast media. No gross acute aortic abnormality. Normal 3 vessel branching of the aortic arch. Central pulmonary arteries are normal caliber. Tunneled right IJ approach dual lumen dialysis catheter tip terminates at the level of the right atrium. Mediastinum/Nodes: No mediastinal fluid or gas. Normal thyroid gland and thoracic inlet. No acute abnormality of the trachea or esophagus. No worrisome pathologically enlarged mediastinal or axillary adenopathy. Shotty subcentimeter mediastinal nodes are low-attenuation and may be reactive or edematous in nature. Hilar nodal evaluation is limited in the absence of intravenous contrast media. Lungs/Pleura: There are features most suggestive of developing interstitial pulmonary edema with peribronchovascular cuffing, diffuse interlobular septal thickening and fissural thickening with pulmonary vascular redistribution/cephalization. There is however a more focal  rounded consolidative opacity seen in the superior segment of the left lower lobe measuring approximately 2.1 x 1.4 x 1.8 cm in size (3/75). Few small scattered sub 5 mm nodules subpleural nodules are seen elsewhere (3/88, 50, 35). No other focal airspace opacity is seen. Some bandlike areas. No pneumothorax. No layering pleural fluid. Upper Abdomen: Prior cholecystectomy. Asymmetric left perinephric stranding, nonspecific given underlying renal dysfunction. Musculoskeletal: No acute osseous abnormality or suspicious osseous lesion. No worrisome chest wall masses or lesions. Mild body wall edema. IMPRESSION: Focal region of consolidation versus masslike opacity present in the superior segment right lower lobe. Burtis Junes an acute infectious or inflammatory process though follow-up imaging should be considered in 6-8 weeks following therapeutic intervention to ensure resolution. Additional features of interstitial pulmonary edema throughout the lungs. With additional features of chronic overload including mild cardiomegaly and pericardial effusion as well as diffuse body wall edema. Asymmetric left perinephric stranding, nonspecific in the setting of underlying renal dysfunction/dialysis. Could correlate with urinalysis if patient is able to produce urine in order to exclude an ascending tract infection. Electronically Signed   By: Lovena Le M.D.   On: 12/04/2020 23:47    EKG: Independently reviewed.  Sinus rhythm 95   Assessment/Plan Principal Problem:   Fever and chills Active Problems:   Type 2 diabetes mellitus with diabetic chronic kidney disease ([place])   End stage renal disease on dialysis [facility])   Essential hypertension  Sleep apnea  Fevers and chills: Differentials could be a developing pneumonia, urinary tract infection, COVID-pneumonia. Will admit patient to progressive unit, continuous cardiac monitoring. As needed Tylenol, cooling blankets as needed.  I suspect patient has a bacterial  infection.  Cultures have been collected and we will treat as soon as we have a source, until then we will continue empiric antibiotics.  Diabetes mellitus type 2 with CKD: A1c, glycemic protocol.  End-stage renal disease on hemodialysis: a.m. team to contact consulted for consult. Continue patient her blood pressure medicine which is Toprol 50, amlodipine 10.  Hypertension: Again we will continue patient on amlodipine and Toprol.  Sleep apnea:  CPAP per home settings.      DVT prophylaxis:  Heparin  Code Status:  Full code  Family Communication:  Ardine Eng (Daughter)  [phone] (Mobile)   Disposition Plan:  Home  Consults called:  None  Admission status: Inpatient   Para Skeans MD [facility] [phone] How to contact the [facility] Attending or Consulting provider [place] or covering provider during after hours [place], for this patient.    Check the care team in [facility] and look for a) attending/consulting [facility] provider listed and b) the [facility] team listed Log into [email or web address] and use [place] universal password to access. If you do not have the password, please contact the hospital operator. Locate the [facility] provider you are looking for under [facility] and page to a number that you can be directly reached. If you still have difficulty reaching the provider, please page the [facility] (Director on Call) for the Hospitalists listed on amion for assistance. [email or web address] Password [facility] 12/05/2020, 12:47 AM

## 2020-12-05 NOTE — Progress Notes (Signed)
Patient seen due to cpap order. Patient states she does not currently have cpap at home and has never used one. Patient reports however does use albuterol inhaler and wants to know if she can have a treatment. Secure chat sent to provider

## 2020-12-05 NOTE — ED Notes (Signed)
Pt moved into an inpatient hospital bed.

## 2020-12-05 NOTE — ED Notes (Signed)
Amber, RN made aware of pt CBG result of 253.

## 2020-12-05 NOTE — Consult Note (Signed)
Referring Provider: No ref. provider found Primary Care Physician:  System, Provider Not In Primary Nephrologist:  Dr.   Luiz Iron for Consultation:    HPI: 45 year old female with history of asthma, end-stage renal disease on dialysis, hypertension, diabetes, depression now admitted with history of fevers and found to have COVID-positive.  She has history of ESRD and has been on dialysis 3 times a week with Monday Wednesday Friday schedule.  She was last dialyzed yesterday.  Presently she denies any chest pain, shortness of breath.  She was given a dose of vancomycin and also she is on remdesivir.  Past Medical History:  Diagnosis Date   Anemia    Asthma    well controlled   Complication of anesthesia    "takes alot for anesthesia to work"   Concussion    h/o   Depression    Diabetes ([place])    last A1c was 5.8 on 09-04-20   Dialysis patient ([place])    T, Th, and Sat   Family history of adverse reaction to anesthesia    son-became aggressive after surgery   GERD (gastroesophageal reflux disease)    occ no meds   Headache    migraines   Hypertension    Palpitations    Pneumonia    h/o   Renal disorder     Past Surgical History:  Procedure Laterality Date   AV FISTULA PLACEMENT Left 11/20/2020   Procedure: ARTERIOVENOUS (AV) FISTULA CREATION ( BRACHIAL CEPHALIC );  Surgeon: Katha Cabal, MD;  Location: [facility];  Service: Vascular;  Laterality: Left;   CESAREAN SECTION N/A    x3   CHOLECYSTECTOMY     DIALYSIS/PERMA CATHETER INSERTION N/A 08/24/2020   Procedure: DIALYSIS/PERMA CATHETER INSERTION;  Surgeon: Algernon Huxley, MD;  Location: [facility];  Service: Cardiovascular;  Laterality: N/A;    Prior to Admission medications   Medication Sig Start Date End Date Taking? Authorizing Provider  acetaminophen (TYLENOL) 500 MG tablet Take 500-1,000 mg by mouth every 6 (six) hours as needed (pain).    [provider]  amLODipine (NORVASC) 10 MG tablet Take 1  tablet (10 mg total) by mouth daily. Patient not taking: Reported on 11/20/2020 11/17/20   Iloabachie, Chioma E, NP  Blood Pressure KIT USE AS DIRECTED. 11/17/20   Iloabachie, Chioma E, NP  CALCITRIOL IV Inject 2 mcg into the vein 3 (three) times a week.    [provider]  calcium carbonate (TUMS - DOSED IN MG ELEMENTAL CALCIUM) 500 MG chewable tablet Chew 1 tablet (200 mg of elemental calcium total) by mouth 3 (three) times daily with meals. Patient taking differently: Chew 1 tablet by mouth 2 (two) times daily. 09/01/20   Bonnielee Haff, MD  diphenhydrAMINE (BENADRYL) 25 MG tablet Take 25 mg by mouth every 6 (six) hours as needed. Taking as needed    [provider]  epoetin alfa (EPOGEN) 10000 UNIT/ML injection Inject 1,400 Units into the vein 3 (three) times a week.    [provider]  HYDROcodone-acetaminophen (NORCO) 5-325 MG tablet Take 1 tablet by mouth every 6 (six) hours as needed for moderate pain. 11/20/20   Schnier, Dolores Lory, MD  Iron Sucrose (VENOFER IV) Inject 100 mg into the vein 3 (three) times a week.    [provider]  metoprolol succinate (TOPROL-XL) 50 MG 24 hr tablet Take 1 tablet (50 mg total) by mouth daily. Take with or immediately following a meal. 11/17/20   Iloabachie, Chioma E, NP  polyethylene glycol (MIRALAX / GLYCOLAX) 17 g packet Mix 17 g with 8oz of water and take by mouth 2 (two) times daily. Patient taking differently: Take 17 g by mouth 2 (two) times daily as needed (constipation). 09/02/20   Max Sane, MD  sertraline (ZOLOFT) 50 MG tablet Take 1 tablet (50 mg total) by mouth daily. Patient not taking: No sig reported 09/01/20   Bonnielee Haff, MD    Current Facility-Administered Medications  Medication Dose Route Frequency Provider Last Rate Last Admin   acetaminophen (TYLENOL) tablet 650 mg  650 mg Oral Q6H PRN Para Skeans, MD   650 mg at 12/05/20 1038   Or   acetaminophen (TYLENOL) suppository 650 mg  650 mg Rectal Q6H PRN  Para Skeans, MD       aspirin EC tablet 81 mg  81 mg Oral Daily Para Skeans, MD   81 mg at 12/05/20 1027   [START ON 12/06/2020] cefTRIAXone (ROCEPHIN) 1 g in sodium chloride 0.9 % 100 mL IVPB  1 g Intravenous Q24H Para Skeans, MD       heparin injection 5,000 Units  5,000 Units Subcutaneous Q8H Florina Ou V, MD   5,000 Units at 12/05/20 0816   insulin aspart (novoLOG) injection 0-9 Units  0-9 Units Subcutaneous TID WC Para Skeans, MD   2 Units at 12/05/20 1232   ondansetron (ZOFRAN) tablet 4 mg  4 mg Oral Q6H PRN Para Skeans, MD       Or   ondansetron [facility]) injection 4 mg  4 mg Intravenous Q6H PRN Para Skeans, MD       [START ON 12/06/2020] remdesivir 100 mg in sodium chloride 0.9 % 100 mL IVPB  100 mg Intravenous Daily Benita Gutter, RPH       [START ON 12/07/2020] vancomycin (VANCOCIN) IVPB 1000 mg/200 mL premix  1,000 mg Intravenous Q M,W,F-HD Para Skeans, MD       Current Outpatient Medications  Medication Sig Dispense Refill   acetaminophen (TYLENOL) 500 MG tablet Take 500-1,000 mg by mouth every 6 (six) hours as needed (pain).     amLODipine (NORVASC) 10 MG tablet Take 1 tablet (10 mg total) by mouth daily. (Patient not taking: Reported on 11/20/2020) 30 tablet 3   Blood Pressure KIT USE AS DIRECTED. 1 kit 0   CALCITRIOL IV Inject 2 mcg into the vein 3 (three) times a week.     calcium carbonate (TUMS - DOSED IN MG ELEMENTAL CALCIUM) 500 MG chewable tablet Chew 1 tablet (200 mg of elemental calcium total) by mouth 3 (three) times daily with meals. (Patient taking differently: Chew 1 tablet by mouth 2 (two) times daily.) 90 tablet 1   diphenhydrAMINE (BENADRYL) 25 MG tablet Take 25 mg by mouth every 6 (six) hours as needed. Taking as needed     epoetin alfa (EPOGEN) 10000 UNIT/ML injection Inject 1,400 Units into the vein 3 (three) times a week.     HYDROcodone-acetaminophen (NORCO) 5-325 MG tablet Take 1 tablet by mouth every 6 (six) hours as needed for moderate pain. 30  tablet 0   Iron Sucrose (VENOFER IV) Inject 100 mg into the vein 3 (three) times a week.     metoprolol succinate (TOPROL-XL) 50 MG 24 hr tablet Take 1 tablet (50 mg total) by mouth daily. Take with or immediately following a meal. 30 tablet 3   polyethylene glycol (MIRALAX / GLYCOLAX) 17 g packet Mix 17 g with 8oz of  water and take by mouth 2 (two) times daily. (Patient taking differently: Take 17 g by mouth 2 (two) times daily as needed (constipation).) 60 each 0   sertraline (ZOLOFT) 50 MG tablet Take 1 tablet (50 mg total) by mouth daily. (Patient not taking: No sig reported) 30 tablet 1    Allergies as of 12/04/2020 - Review Complete 12/04/2020  Allergen Reaction Noted   Statins  09/28/2020   Cholesterol Rash 12/08/2019    Family History  Problem Relation Age of Onset   Diabetes Mother    Skin cancer Mother    Heart attack Father        died at ~64 years of age   Alcohol abuse Brother    Diabetes Brother     Social History   Socioeconomic History   Marital status: Married    Spouse name: Not on file   Number of children: Not on file   Years of education: Not on file   Highest education level: Not on file  Occupational History   Not on file  Tobacco Use   Smoking status: Never   Smokeless tobacco: Never  Vaping Use   Vaping Use: Never used  Substance and Sexual Activity   Alcohol use: Never   Drug use: Never   Sexual activity: Not on file  Other Topics Concern   Not on file  Social History Narrative   Not on file   Social Determinants of Health   Financial Resource Strain: Not on file  Food Insecurity: Not on file  Transportation Needs: Not on file  Physical Activity: Not on file  Stress: Not on file  Social Connections: Not on file  Intimate Partner Violence: Not on file    Review of Systems: Gen: Denies any fever, chills, sweats, anorexia, fatigue, weakness, malaise, weight loss, and sleep disorder HEENT: No visual complaints, No history of  Retinopathy. Normal external appearance No Epistaxis or Sore throat. No sinusitis.   CV: Denies chest pain, angina, palpitations, syncope, orthopnea, PND, peripheral edema, and claudication. Resp: Denies dyspnea at rest, dyspnea with exercise, cough, sputum, wheezing, coughing up blood, and pleurisy. GI: Denies vomiting blood, jaundice, and fecal incontinence.   Denies dysphagia or odynophagia. GU : Denies urinary burning, blood in urine, urinary frequency, urinary hesitancy, nocturnal urination, and urinary incontinence.  No renal calculi. MS: Denies joint pain, limitation of movement, and swelling, stiffness, low back pain, extremity pain. Denies muscle weakness, cramps, atrophy.  No use of non steroidal antiinflammatory drugs. Derm: Denies rash, itching, dry skin, hives, moles, warts, or unhealing ulcers.  Psych: Denies depression, anxiety, memory loss, suicidal ideation, hallucinations, paranoia, and confusion. Heme: Denies bruising, bleeding, and enlarged lymph nodes. Neuro: No headache.  No diplopia. No dysarthria.  No dysphasia.  No history of CVA.  No Seizures. No paresthesias.  No weakness. Endocrine No DM.  No Thyroid disease.  No Adrenal disease.  Physical Exam: Vital signs in last 24 hours: Temp:  [99.3 F (37.4 C)-100.9 F (38.3 C)] 99.3 F (37.4 C) (07/23 1158) Pulse Rate:  [66-94] 72 (07/23 1130) Resp:  [14-22] 14 (07/23 1130) BP: (140-219)/(92-114) 168/94 (07/23 1130) SpO2:  [94 %-100 %] 97 % (07/23 1130) Weight:  [794 kg] 119 kg (07/22 1740)   General:   Alert,  Well-developed, well-nourished, pleasant and cooperative in NAD Head:  Normocephalic and atraumatic. Eyes:  Sclera clear, no icterus.   Conjunctiva pink. Ears:  Normal auditory acuity. Nose:  No deformity, discharge,  or lesions. Mouth:  No  deformity or lesions, dentition normal. Neck:  Supple; no masses or thyromegaly. JVP not elevated Lungs:  Clear throughout to auscultation.   No wheezes, crackles, or  rhonchi. No acute distress. Heart:  Regular rate and rhythm; no murmurs, clicks, rubs,  or gallops. Abdomen:  Soft, nontender and nondistended. No masses, hepatosplenomegaly or hernias noted. Normal bowel sounds, without guarding, and without rebound.   Msk:  Symmetrical without gross deformities. Normal posture. Pulses:  No carotid, renal, femoral bruits. DP and PT symmetrical and equal Extremities:  Without clubbing or edema. Neurologic:  Alert and  oriented x4;  grossly normal neurologically. Skin:  Intact without significant lesions or rashes. Cervical Nodes:  No significant cervical adenopathy. Psych:  Alert and cooperative. Normal mood and affect.  Intake/Output from previous day: 07/22 0701 - 07/23 0700 In: 600 [IV Piggyback:600] Out: -  Intake/Output this shift: No intake/output data recorded.  Lab Results: Recent Labs    12/04/20 1746 12/05/20 0451  WBC 9.6 6.4  HGB 10.4* 9.3*  HCT 30.2* 27.9*  PLT 273 239   BMET Recent Labs    12/04/20 1746 12/05/20 0451  NA 131* 131*  K 4.0 4.3  CL 101 100  CO2 22 24  GLUCOSE 183* 210*  BUN 15 20  CREATININE 4.81* 5.90*  CALCIUM 8.0* 7.6*   LFT Recent Labs    12/04/20 2253 12/05/20 0451  PROT 6.7 5.9*  ALBUMIN 3.1* 2.7*  AST 21 17  ALT 12 9  ALKPHOS 106 94  BILITOT 0.6 0.8  BILIDIR <0.1  --   IBILI NOT CALCULATED  --    PT/INR No results for input(s): LABPROT, INR in the last 72 hours. Hepatitis Panel No results for input(s): HEPBSAG, HCVAB, HEPAIGM, HEPBIGM in the last 72 hours.  Studies/Results: DG Chest 2 View  Result Date: 12/04/2020 CLINICAL DATA:  Shortness of breath.  Headache and nausea. EXAM: CHEST - 2 VIEW COMPARISON:  08/23/2020 FINDINGS: Right IJ dialysis catheter distal tip: SVC. No pneumothorax or complicating feature. Bibasilar subsegmental atelectasis or scarring, left greater than right. Thoracic spondylosis. Mild enlargement of the cardiopericardial silhouette, without edema. No blunting of  the costophrenic angles. IMPRESSION: 1. Right IJ dialysis catheter tip: Lower SVC. 2. Bibasilar subsegmental atelectasis versus scarring. 3. Mild enlargement of the cardiopericardial silhouette, without edema. Electronically Signed   By: Van Clines M.D.   On: 12/04/2020 18:25   CT Head Wo Contrast  Result Date: 12/04/2020 CLINICAL DATA:  Headache, unable to complete dialysis EXAM: CT HEAD WITHOUT CONTRAST TECHNIQUE: Contiguous axial images were obtained from the base of the skull through the vertex without intravenous contrast. COMPARISON:  CT head 12/08/2019 FINDINGS: Brain: No evidence of acute infarction, hemorrhage, hydrocephalus, extra-axial collection, visible mass lesion or mass effect. Vascular: No hyperdense vessel or unexpected calcification. Skull: No calvarial fracture or suspicious osseous lesion. No scalp swelling or hematoma. Sinuses/Orbits: Minimal mural thickening in ethmoids and left maxillary sinus. Paranasal sinuses and mastoid air cells are otherwise predominantly clear. Included orbital structures are unremarkable. Other: None IMPRESSION: No acute intracranial abnormality. Electronically Signed   By: Lovena Le M.D.   On: 12/04/2020 23:39   CT Chest Wo Contrast  Result Date: 12/04/2020 CLINICAL DATA:  Shortness of breath, headache, nausea, unable to complete dialysis EXAM: CT CHEST WITHOUT CONTRAST TECHNIQUE: Multidetector CT imaging of the chest was performed following the standard protocol without IV contrast. COMPARISON:  Radiograph 12/04/2020 FINDINGS: Cardiovascular: Mild cardiomegaly. Trace pericardial effusion. Suboptimal assessment of the vasculature in the absence of  contrast media. No gross acute aortic abnormality. Normal 3 vessel branching of the aortic arch. Central pulmonary arteries are normal caliber. Tunneled right IJ approach dual lumen dialysis catheter tip terminates at the level of the right atrium. Mediastinum/Nodes: No mediastinal fluid or gas. Normal  thyroid gland and thoracic inlet. No acute abnormality of the trachea or esophagus. No worrisome pathologically enlarged mediastinal or axillary adenopathy. Shotty subcentimeter mediastinal nodes are low-attenuation and may be reactive or edematous in nature. Hilar nodal evaluation is limited in the absence of intravenous contrast media. Lungs/Pleura: There are features most suggestive of developing interstitial pulmonary edema with peribronchovascular cuffing, diffuse interlobular septal thickening and fissural thickening with pulmonary vascular redistribution/cephalization. There is however a more focal rounded consolidative opacity seen in the superior segment of the left lower lobe measuring approximately 2.1 x 1.4 x 1.8 cm in size (3/75). Few small scattered sub 5 mm nodules subpleural nodules are seen elsewhere (3/88, 50, 35). No other focal airspace opacity is seen. Some bandlike areas. No pneumothorax. No layering pleural fluid. Upper Abdomen: Prior cholecystectomy. Asymmetric left perinephric stranding, nonspecific given underlying renal dysfunction. Musculoskeletal: No acute osseous abnormality or suspicious osseous lesion. No worrisome chest wall masses or lesions. Mild body wall edema. IMPRESSION: Focal region of consolidation versus masslike opacity present in the superior segment right lower lobe. Burtis Junes an acute infectious or inflammatory process though follow-up imaging should be considered in 6-8 weeks following therapeutic intervention to ensure resolution. Additional features of interstitial pulmonary edema throughout the lungs. With additional features of chronic overload including mild cardiomegaly and pericardial effusion as well as diffuse body wall edema. Asymmetric left perinephric stranding, nonspecific in the setting of underlying renal dysfunction/dialysis. Could correlate with urinalysis if patient is able to produce urine in order to exclude an ascending tract infection. Electronically  Signed   By: Lovena Le M.D.   On: 12/04/2020 23:47    Assessment/Plan:   45 year old female with history of asthma, end-stage renal disease on dialysis, hypertension, diabetes, depression now admitted with history of fevers and found to have COVID-positive.  She has history of ESRD and has been on dialysis 3 times a week with Monday Wednesday Friday schedule.  She was last dialyzed yesterday.  Presently she denies any chest pain, shortness of breath.  She was given a dose of vancomycin and also she is on remdesivir.  ESRD-we will schedule next dialysis on Monday. ANEMIA-we will follow anemia protocols. MBD we will check phosphorus and parathyroid levels. HTN/VOL continue present treatments. ACCESS OTHER COVID-positive: Treatment protocols noted.  Patient is advised to stay on fluid restriction to 1 L a day.    LOS: 0 Lyla Son, MD [facility] _0 _1 :34 PM

## 2020-12-05 NOTE — ED Notes (Signed)
Patient given apple juice and pillow to prop up left arm
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
## 2020-12-05 NOTE — ED Notes (Signed)
Dr. Posey Pronto informed via epic secure chat that the patient is covid +. No new orders received.

## 2020-12-06 LAB — ECHOCARDIOGRAM COMPLETE
AR max vel: 2.63 cm2
AV Area VTI: 2.76 cm2
AV Area mean vel: 2.69 cm2
AV Mean grad: 7 mmHg
AV Peak grad: 11 mmHg
Ao pk vel: 1.66 m/s
Area-P 1/2: 2.66 cm2
Height: 62 in
S' Lateral: 3.45 cm
Weight: 4197.56 oz

## 2020-12-06 LAB — BASIC METABOLIC PANEL
Anion gap: 11 (ref 5–15)
BUN: 37 mg/dL — ABNORMAL HIGH (ref 6–20)
CO2: 21 mmol/L — ABNORMAL LOW (ref 22–32)
Calcium: 7.8 mg/dL — ABNORMAL LOW (ref 8.9–10.3)
Chloride: 98 mmol/L (ref 98–111)
Creatinine, Ser: 8.45 mg/dL — ABNORMAL HIGH (ref 0.44–1.00)
GFR, Estimated: 5 mL/min — ABNORMAL LOW (ref >60)
Glucose, Bld: 182 mg/dL — ABNORMAL HIGH (ref 70–99)
Potassium: 4.5 mmol/L (ref 3.5–5.1)
Sodium: 130 mmol/L — ABNORMAL LOW (ref 135–145)

## 2020-12-06 LAB — CBC
HCT: 29.8 % — ABNORMAL LOW (ref 36.0–46.0)
Hemoglobin: 10 g/dL — ABNORMAL LOW (ref 12.0–15.0)
MCH: 29.6 pg (ref 26.0–34.0)
MCHC: 33.6 g/dL (ref 30.0–36.0)
MCV: 88.2 fL (ref 80.0–100.0)
Platelets: 239 10*3/uL (ref 150–400)
RBC: 3.38 MIL/uL — ABNORMAL LOW (ref 3.87–5.11)
RDW: 13.3 % (ref 11.5–15.5)
WBC: 5.7 10*3/uL (ref 4.0–10.5)
nRBC: 0 % (ref 0.0–0.2)

## 2020-12-06 LAB — GLUCOSE, CAPILLARY
Glucose-Capillary: 143 mg/dL — ABNORMAL HIGH (ref 70–99)
Glucose-Capillary: 180 mg/dL — ABNORMAL HIGH (ref 70–99)
Glucose-Capillary: 180 mg/dL — ABNORMAL HIGH (ref 70–99)
Glucose-Capillary: 220 mg/dL — ABNORMAL HIGH (ref 70–99)

## 2020-12-06 MED ORDER — CEFDINIR 300 MG PO CAPS: 300.0000 mg | ORAL_CAPSULE | Freq: Two times a day (BID) | ORAL | Status: DC

## 2020-12-06 MED ORDER — CHLORHEXIDINE GLUCONATE CLOTH 2 % EX PADS
6.0000 | MEDICATED_PAD | Freq: Every day | CUTANEOUS | Status: DC
  Administered 2020-12-07: 13:00:00 6 via TOPICAL

## 2020-12-06 MED ORDER — CEFDINIR 300 MG PO CAPS
300.0000 mg | ORAL_CAPSULE | ORAL | Status: DC
  Administered 2020-12-06: 17:00:00 300 mg via ORAL
  Filled 2020-12-06 (×2): qty 1

## 2020-12-06 MED ORDER — CEFDINIR 300 MG PO CAPS
300.0000 mg | ORAL_CAPSULE | Freq: Once | ORAL | Status: DC
  Filled 2020-12-06: qty 1

## 2020-12-06 MED ORDER — POLYETHYLENE GLYCOL 3350 17 G PO PACK
17.0000 g | PACK | Freq: Every day | ORAL | Status: DC
  Administered 2020-12-06 – 2020-12-07 (×2): 17 g via ORAL
  Filled 2020-12-06 (×2): qty 1

## 2020-12-06 MED ORDER — ALBUTEROL SULFATE HFA 108 (90 BASE) MCG/ACT IN AERS
1.0000 | INHALATION_SPRAY | RESPIRATORY_TRACT | Status: DC | PRN
  Filled 2020-12-06: qty 6.7

## 2020-12-06 MED ORDER — HYDRALAZINE HCL 50 MG PO TABS
25.0000 mg | ORAL_TABLET | Freq: Three times a day (TID) | ORAL | Status: DC
  Administered 2020-12-06 – 2020-12-07 (×4): 25 mg via ORAL
  Filled 2020-12-06 (×4): qty 1

## 2020-12-06 NOTE — ED Notes (Signed)
Pt ambulatory to bedside commode.

## 2020-12-06 NOTE — Progress Notes (Signed)
[facility]  PROGRESS NOTE   Subjective:   Patient seen at bedside.  Feels better than yesterday. She ate well today. She is ESRD on hemodialysis with a Monday Wednesday Friday schedule  Objective:  Vital signs in last 24 hours:  Temp:  [98.9 F (37.2 C)-99.7 F (37.6 C)] 99.7 F (37.6 C) (07/24 1143) Pulse Rate:  [68-85] 75 (07/24 1143) Resp:  [16-20] 18 (07/24 1143) BP: (158-178)/(88-104) 162/90 (07/24 1143) SpO2:  [92 %-100 %] 99 % (07/24 1143)  Weight change:  Filed Weights   12/04/20 1740  Weight: 119 kg    Intake/Output: I/O last 3 completed shifts: In: 700 [IV Piggyback:700] Out: 300 [Urine:300]   Intake/Output this shift:  Total I/O In: 100 [IV Piggyback:100] Out: -   Physical Exam: General:  No acute distress  Head:  Normocephalic, atraumatic. Moist oral mucosal membranes  Eyes:  Anicteric  Neck:  Supple  Lungs:   Clear to auscultation, normal effort  Heart:  S1S2 no rubs  Abdomen:   Soft, nontender, bowel sounds present  Extremities:  peripheral edema.  Neurologic:  Awake, alert, following commands  Skin:  No lesions  Access:     Basic Metabolic Panel: Recent Labs  Lab 12/04/20 1746 12/05/20 0451 12/06/20 0636  NA 131* 131* 130*  K 4.0 4.3 4.5  CL 101 100 98  CO2 22 24 21*  GLUCOSE 183* 210* 182*  BUN 15 20 37*  CREATININE 4.81* 5.90* 8.45*  CALCIUM 8.0* 7.6* 7.8*    CBC: Recent Labs  Lab 12/04/20 1746 12/05/20 0451 12/06/20 0636  WBC 9.6 6.4 5.7  HGB 10.4* 9.3* 10.0*  HCT 30.2* 27.9* 29.8*  MCV 87.8 88.3 88.2  PLT 273 239 239     Urinalysis: Recent Labs    12/05/20 1030  COLORURINE YELLOW*  LABSPEC 1.023  PHURINE 8.0  GLUCOSEU >=500*  HGBUR NEGATIVE  BILIRUBINUR NEGATIVE  KETONESUR NEGATIVE  PROTEINUR >=300*  NITRITE NEGATIVE  LEUKOCYTESUR NEGATIVE      Imaging: DG Chest 2 View  Result Date: 12/04/2020 CLINICAL DATA:  Shortness of breath.  Headache and nausea. EXAM: CHEST - 2 VIEW COMPARISON:   08/23/2020 FINDINGS: Right IJ dialysis catheter distal tip: SVC. No pneumothorax or complicating feature. Bibasilar subsegmental atelectasis or scarring, left greater than right. Thoracic spondylosis. Mild enlargement of the cardiopericardial silhouette, without edema. No blunting of the costophrenic angles. IMPRESSION: 1. Right IJ dialysis catheter tip: Lower SVC. 2. Bibasilar subsegmental atelectasis versus scarring. 3. Mild enlargement of the cardiopericardial silhouette, without edema. Electronically Signed   By: Van Clines M.D.   On: 12/04/2020 18:25   CT Head Wo Contrast  Result Date: 12/04/2020 CLINICAL DATA:  Headache, unable to complete dialysis EXAM: CT HEAD WITHOUT CONTRAST TECHNIQUE: Contiguous axial images were obtained from the base of the skull through the vertex without intravenous contrast. COMPARISON:  CT head 12/08/2019 FINDINGS: Brain: No evidence of acute infarction, hemorrhage, hydrocephalus, extra-axial collection, visible mass lesion or mass effect. Vascular: No hyperdense vessel or unexpected calcification. Skull: No calvarial fracture or suspicious osseous lesion. No scalp swelling or hematoma. Sinuses/Orbits: Minimal mural thickening in ethmoids and left maxillary sinus. Paranasal sinuses and mastoid air cells are otherwise predominantly clear. Included orbital structures are unremarkable. Other: None IMPRESSION: No acute intracranial abnormality. Electronically Signed   By: Lovena Le M.D.   On: 12/04/2020 23:39   CT Chest Wo Contrast  Result Date: 12/04/2020 CLINICAL DATA:  Shortness of breath, headache, nausea, unable to complete dialysis EXAM: CT  CHEST WITHOUT CONTRAST TECHNIQUE: Multidetector CT imaging of the chest was performed following the standard protocol without IV contrast. COMPARISON:  Radiograph 12/04/2020 FINDINGS: Cardiovascular: Mild cardiomegaly. Trace pericardial effusion. Suboptimal assessment of the vasculature in the absence of contrast media. No  gross acute aortic abnormality. Normal 3 vessel branching of the aortic arch. Central pulmonary arteries are normal caliber. Tunneled right IJ approach dual lumen dialysis catheter tip terminates at the level of the right atrium. Mediastinum/Nodes: No mediastinal fluid or gas. Normal thyroid gland and thoracic inlet. No acute abnormality of the trachea or esophagus. No worrisome pathologically enlarged mediastinal or axillary adenopathy. Shotty subcentimeter mediastinal nodes are low-attenuation and may be reactive or edematous in nature. Hilar nodal evaluation is limited in the absence of intravenous contrast media. Lungs/Pleura: There are features most suggestive of developing interstitial pulmonary edema with peribronchovascular cuffing, diffuse interlobular septal thickening and fissural thickening with pulmonary vascular redistribution/cephalization. There is however a more focal rounded consolidative opacity seen in the superior segment of the left lower lobe measuring approximately 2.1 x 1.4 x 1.8 cm in size (3/75). Few small scattered sub 5 mm nodules subpleural nodules are seen elsewhere (3/88, 50, 35). No other focal airspace opacity is seen. Some bandlike areas. No pneumothorax. No layering pleural fluid. Upper Abdomen: Prior cholecystectomy. Asymmetric left perinephric stranding, nonspecific given underlying renal dysfunction. Musculoskeletal: No acute osseous abnormality or suspicious osseous lesion. No worrisome chest wall masses or lesions. Mild body wall edema. IMPRESSION: Focal region of consolidation versus masslike opacity present in the superior segment right lower lobe. Burtis Junes an acute infectious or inflammatory process though follow-up imaging should be considered in 6-8 weeks following therapeutic intervention to ensure resolution. Additional features of interstitial pulmonary edema throughout the lungs. With additional features of chronic overload including mild cardiomegaly and pericardial  effusion as well as diffuse body wall edema. Asymmetric left perinephric stranding, nonspecific in the setting of underlying renal dysfunction/dialysis. Could correlate with urinalysis if patient is able to produce urine in order to exclude an ascending tract infection. Electronically Signed   By: Lovena Le M.D.   On: 12/04/2020 23:47   ECHOCARDIOGRAM COMPLETE  Result Date: 12/06/2020    ECHOCARDIOGRAM REPORT   Patient Name:   CHRISOULA HAPEMAN Date of Exam: 12/05/2020 Medical Rec #:  [id number]      Height:       62.0 in Accession #:    [id number]     Weight:       262.3 lb Date of Birth:  1975/06/15     BSA:          2.145 m Patient Age:    60 years       BP:           165/100 mmHg Patient Gender: F              HR:           71 bpm. Exam Location:  [facility] Procedure: 2D Echo Indications:     Fever  History:         Patient has no prior history of Echocardiogram examinations.                  Asthma; Risk Factors:ESRD, Hypertension and Diabetes.  Sonographer:     L Thornton-Maynard Referring Phys:  [id number] PATEL Diagnosing Phys: Neoma Laming MD  Sonographer Comments: Image acquisition challenging due to patient body habitus. IMPRESSIONS  1. Left ventricular ejection fraction, by estimation, is 55 to 60%.  The left ventricle has normal function. The left ventricle has no regional wall motion abnormalities. There is mild concentric left ventricular hypertrophy. Left ventricular diastolic parameters are consistent with Grade I diastolic dysfunction (impaired relaxation).  2. Right ventricular systolic function is normal. The right ventricular size is normal.  3. The mitral valve is normal in structure. No evidence of mitral valve regurgitation. No evidence of mitral stenosis.  4. The aortic valve is normal in structure. Aortic valve regurgitation is not visualized. No aortic stenosis is present.  5. The inferior vena cava is normal in size with greater than 50% respiratory variability, suggesting right atrial  pressure of 3 mmHg. FINDINGS  Left Ventricle: Left ventricular ejection fraction, by estimation, is 55 to 60%. The left ventricle has normal function. The left ventricle has no regional wall motion abnormalities. The left ventricular internal cavity size was normal in size. There is  mild concentric left ventricular hypertrophy. Left ventricular diastolic parameters are consistent with Grade I diastolic dysfunction (impaired relaxation). Right Ventricle: The right ventricular size is normal. No increase in right ventricular wall thickness. Right ventricular systolic function is normal. Left Atrium: Left atrial size was normal in size. Right Atrium: Right atrial size was normal in size. Pericardium: There is no evidence of pericardial effusion. Mitral Valve: The mitral valve is normal in structure. No evidence of mitral valve regurgitation. No evidence of mitral valve stenosis. Tricuspid Valve: The tricuspid valve is normal in structure. Tricuspid valve regurgitation is not demonstrated. No evidence of tricuspid stenosis. Aortic Valve: The aortic valve is normal in structure. Aortic valve regurgitation is not visualized. No aortic stenosis is present. Aortic valve mean gradient measures 7.0 mmHg. Aortic valve peak gradient measures 11.0 mmHg. Aortic valve area, by VTI measures 2.76 cm. Pulmonic Valve: The pulmonic valve was normal in structure. Pulmonic valve regurgitation is not visualized. No evidence of pulmonic stenosis. Aorta: The aortic root is normal in size and structure. Venous: The inferior vena cava is normal in size with greater than 50% respiratory variability, suggesting right atrial pressure of 3 mmHg. IAS/Shunts: No atrial level shunt detected by color flow Doppler.  LEFT VENTRICLE PLAX 2D LVIDd:         5.01 cm  Diastology LVIDs:         3.45 cm  LV e' medial:    4.13 cm/s LV PW:         1.36 cm  LV E/e' medial:  24.5 LV IVS:        1.57 cm  LV e' lateral:   4.68 cm/s LVOT diam:     2.60 cm  LV E/e'  lateral: 21.6 LV SV:         107 LV SV Index:   50 LVOT Area:     5.31 cm  RIGHT VENTRICLE RV S prime:     9.25 cm/s TAPSE (M-mode): 2.2 cm LEFT ATRIUM             Index LA diam:        2.60 cm 1.21 cm/m LA Vol (A2C):   68.4 ml 31.88 ml/m LA Vol (A4C):   51.2 ml 23.87 ml/m LA Biplane Vol: 60.8 ml 28.34 ml/m  AORTIC VALVE                    PULMONIC VALVE AV Area (Vmax):    2.63 cm     PV Vmax:       0.87 m/s AV Area (Vmean):   2.69  cm     PV Peak grad:  3.0 mmHg AV Area (VTI):     2.76 cm AV Vmax:           166.00 cm/s AV Vmean:          124.000 cm/s AV VTI:            0.387 m AV Peak Grad:      11.0 mmHg AV Mean Grad:      7.0 mmHg LVOT Vmax:         82.20 cm/s LVOT Vmean:        62.900 cm/s LVOT VTI:          0.201 m LVOT/AV VTI ratio: 0.52  AORTA Ao Root diam: 3.80 cm MITRAL VALVE MV Area (PHT): 2.66 cm     SHUNTS MV Decel Time: 285 msec     Systemic VTI:  0.20 m MV E velocity: 101.00 cm/s  Systemic Diam: 2.60 cm MV A velocity: 120.00 cm/s MV E/A ratio:  0.84 Neoma Laming MD Electronically signed by Neoma Laming MD Signature Date/Time: 12/06/2020/9:35:07 AM    Final      Medications:    remdesivir 100 mg in NS 100 mL Stopped (12/06/20 1111)    amLODipine  10 mg Oral Daily   aspirin EC  81 mg Oral Daily   cefdinir  300 mg Oral Once per day on Sun Mon Wed Fri   heparin  5,000 Units Subcutaneous Q8H   hydrALAZINE  25 mg Oral TID   insulin aspart  0-9 Units Subcutaneous TID WC    Assessment/ Plan:     Principal Problem:   Fever and chills Active Problems:   Type 2 diabetes mellitus with diabetic chronic kidney disease (HCC)   End stage renal disease on dialysis [facility])   Essential hypertension   Sleep apnea   Chills with fever   COVID-19 virus infection  45 year old female with history of asthma, end-stage renal disease on dialysis, hypertension, diabetes, depression now admitted with history of fevers and found to have COVID-positive.  She has history of ESRD and has been on dialysis  3 times a week with Monday Wednesday Friday schedule.  She was last dialyzed yesterday.  Presently she denies any chest pain, shortness of breath.  She was given a dose of vancomycin and also she is on remdesivir.   ESRD-we will schedule next dialysis on Monday. ANEMIA-we will follow anemia protocols. MBD we will check phosphorus and parathyroid levels. HTN/VOL continue present treatments. ACCESS OTHER COVID-positive: Treatment protocols noted.   Patient is advised to stay on fluid restriction to 1 L a day.    LOS: [place], MD [facility] 7/24/20223:54 PM

## 2020-12-06 NOTE — Progress Notes (Signed)
PROGRESS NOTE    [name]  [id number] DOB: 03-03-1976 DOA: 12/04/2020 PCP: System, Provider Not In  Brief Narrative: 45 year old female with history of ESRD on hemodialysis M WF, type 2 diabetes mellitus, morbid obesity, obstructive sleep apnea, hypertension, anemia of chronic disease presented to the ED from dialysis with headache fever chills vomiting and congestion, associated with body aches In the ED she was febrile. -CT chest noted focal region of consolidation versus masslike opacity present in the superior segment right lower lobe, favor to be an acute infectious, Asymmetric left perinephric stranding  Assessment & Plan:   SARS COVID-19 infection -Symptomatic with myalgias cough congestion, given multiple risk factors started on IV remdesivir, continue this Day2 -No indication for steroids at this time, airborne precautions -Right lower lobe consolidation versus masslike opacity on CT chest is not consistent with COVID-pneumonia  Abnormal CT chest, right lower lobe consolidation versus masslike opacity -This does not correspond with COVID pneumonia -Favored to be infectious -Treated with IV vancomycin and cefepime on admission, cultures negative thus far, transition to oral cefdinir - will need follow-up imaging in 6 weeks  Left perinephric stranding -No clear symptoms of UTI, urinalysis is abnormal, urine culture reintubated  ESRD on hemodialysis -Nephrology consulted, next HD is due tomorrow  Anemia of chronic disease  Type 2 diabetes mellitus -CBGs are stable, sliding scale insulin  Hypertension -Uncontrolled, added amlodipine yesterday, start hydralazine  DVT prophylaxis: Heparin subcutaneous Code Status: Full code Family Communication: No family at bedside Disposition Plan:  Status is: Inpatient  Remains inpatient appropriate because:Inpatient level of care appropriate due to severity of illness  Dispo: The patient is from: Home               Anticipated d/c is to: Home              Patient currently is not medically stable to d/c.   Difficult to place patient No   Consultants:  Nephrology  Procedures:   Antimicrobials:    Subjective: -Feels little better overall, complains of sore throat, congestion, denies cough or dyspnea  Objective: Vitals:   12/06/20 0730 12/06/20 0900 12/06/20 0929 12/06/20 1143  BP: (!) 174/91 (!) 160/92 (!) 164/91 (!) 162/90  Pulse: 72 79 74 75  Resp: '20 16 18 18  '$ Temp:  99.6 F (37.6 C) 98.9 F (37.2 C) 99.7 F (37.6 C)  TempSrc:  Oral Oral   SpO2: 97% 98% 98% 99%  Weight:      Height:        Intake/Output Summary (Last 24 hours) at 12/06/2020 1254 Last data filed at 12/06/2020 0109 Gross per 24 hour  Intake 100 ml  Output 300 ml  Net -200 ml   Filed Weights   12/04/20 1740  Weight: 119 kg    Examination:  General exam: Obese, chronically ill female, sitting up in bed, AAOx3, ill-appearing HEENT: Neck obese unable to assess JVD, right IJ HD catheter noted CVS: S1-S2, regular rate rhythm Lungs: Poor air movement bilaterally otherwise clear Abdomen: Soft, nontender, bowel sounds present Extremities: Left arm AV fistula, no edema  Psychiatry: Mood & affect appropriate.     Data Reviewed:   CBC: Recent Labs  Lab 12/04/20 1746 12/05/20 0451 12/06/20 0636  WBC 9.6 6.4 5.7  HGB 10.4* 9.3* 10.0*  HCT 30.2* 27.9* 29.8*  MCV 87.8 88.3 88.2  PLT 273 239 [id number]   Basic Metabolic Panel: Recent Labs  Lab 12/04/20 1746 12/05/20 0451 12/06/20 0636  NA 131* 131* 130*  K 4.0 4.3 4.5  CL 101 100 98  CO2 22 24 21*  GLUCOSE 183* 210* 182*  BUN 15 20 37*  CREATININE 4.81* 5.90* 8.45*  CALCIUM 8.0* 7.6* 7.8*   GFR: Estimated Creatinine Clearance: 10.3 mL/min (A) (by C-G formula based on SCr of 8.45 mg/dL (H)). Liver Function Tests: Recent Labs  Lab 12/04/20 2253 12/05/20 0451  AST 21 17  ALT 12 9  ALKPHOS 106 94  BILITOT 0.6 0.8  PROT 6.7 5.9*  ALBUMIN 3.1*  2.7*   Recent Labs  Lab 12/04/20 2253  LIPASE 36   No results for input(s): AMMONIA in the last 168 hours. Coagulation Profile: No results for input(s): INR, PROTIME in the last 168 hours. Cardiac Enzymes: No results for input(s): CKTOTAL, CKMB, CKMBINDEX, TROPONINI in the last 168 hours. BNP (last 3 results) No results for input(s): PROBNP in the last 8760 hours. HbA1C: No results for input(s): HGBA1C in the last 72 hours. CBG: Recent Labs  Lab 12/05/20 0814 12/05/20 1153 12/05/20 1655 12/06/20 0925 12/06/20 1144  GLUCAP 148* 183* 253* 143* 180*   Lipid Profile: No results for input(s): CHOL, HDL, LDLCALC, TRIG, CHOLHDL, LDLDIRECT in the last 72 hours. Thyroid Function Tests: No results for input(s): TSH, T4TOTAL, FREET4, T3FREE, THYROIDAB in the last 72 hours. Anemia Panel: No results for input(s): VITAMINB12, FOLATE, FERRITIN, TIBC, IRON, RETICCTPCT in the last 72 hours. Urine analysis:    Component Value Date/Time   COLORURINE YELLOW (A) 12/05/2020 1030   APPEARANCEUR CLOUDY (A) 12/05/2020 1030   LABSPEC 1.023 12/05/2020 1030   PHURINE 8.0 12/05/2020 1030   GLUCOSEU >=500 (A) 12/05/2020 1030   HGBUR NEGATIVE 12/05/2020 1030   BILIRUBINUR NEGATIVE 12/05/2020 1030   KETONESUR NEGATIVE 12/05/2020 1030   PROTEINUR >=300 (A) 12/05/2020 1030   NITRITE NEGATIVE 12/05/2020 1030   LEUKOCYTESUR NEGATIVE 12/05/2020 1030   Sepsis Labs: '@LABRCNTIP'$ (procalcitonin:4,lacticidven:4)  ) Recent Results (from the past 240 hour(s))  Resp Panel by RT-PCR (Flu A&B, Covid) Nasopharyngeal Swab     Status: Abnormal   Collection Time: 12/04/20 10:53 PM   Specimen: Nasopharyngeal Swab; Nasopharyngeal(NP) swabs in vial transport medium  Result Value Ref Range Status   SARS Coronavirus 2 by RT PCR POSITIVE (A) NEGATIVE Final    Comment: RESULT CALLED TO, READ BACK BY AND VERIFIED WITH: MELANIE FOWLER @ [id number] 12/05/20 LFD (NOTE) SARS-CoV-2 target nucleic acids are DETECTED.  The  SARS-CoV-2 RNA is generally detectable in upper respiratory specimens during the acute phase of infection. Positive results are indicative of the presence of the identified virus, but do not rule out bacterial infection or co-infection with other pathogens not detected by the test. Clinical correlation with patient history and other diagnostic information is necessary to determine patient infection status. The expected result is Negative.  Fact Sheet for Patients: EntrepreneurPulse.com.au  Fact Sheet for Healthcare Providers: IncredibleEmployment.be  This test is not yet approved or cleared by the [place] FDA and  has been authorized for detection and/or diagnosis of SARS-CoV-2 by FDA under an Emergency Use Authorization (EUA).  This EUA will remain in effect (meaning this test can b e used) for the duration of  the COVID-19 declaration under Section 564(b)(1) of the Act, 21 U.S.C. section 360bbb-3(b)(1), unless the authorization is terminated or revoked sooner.     Influenza A by PCR NEGATIVE NEGATIVE Final   Influenza B by PCR NEGATIVE NEGATIVE Final    Comment: (NOTE) The Xpert Xpress SARS-CoV-2/FLU/RSV plus assay is intended as an aid in  the diagnosis of influenza from Nasopharyngeal swab specimens and should not be used as a sole basis for treatment. Nasal washings and aspirates are unacceptable for Xpert Xpress SARS-CoV-2/FLU/RSV testing.  Fact Sheet for Patients: EntrepreneurPulse.com.au  Fact Sheet for Healthcare Providers: IncredibleEmployment.be  This test is not yet approved or cleared by the [place] FDA and has been authorized for detection and/or diagnosis of SARS-CoV-2 by FDA under an Emergency Use Authorization (EUA). This EUA will remain in effect (meaning this test can be used) for the duration of the COVID-19 declaration under Section 564(b)(1) of the Act, 21 U.S.C. section  360bbb-3(b)(1), unless the authorization is terminated or revoked.  Performed at [facility], [address] [place], [place] [address]   Culture, blood (Routine X 2) w Reflex to ID Panel     Status: None (Preliminary result)   Collection Time: 12/04/20 10:53 PM   Specimen: BLOOD  Result Value Ref Range Status   Specimen Description BLOOD RIGHT ANTECUBITAL  Final   Special Requests   Final    BOTTLES DRAWN AEROBIC AND ANAEROBIC Blood Culture adequate volume   Culture   Final    NO GROWTH < 12 HOURS Performed at [facility], [address] [place], [place] [address]    Report Status PENDING  Incomplete  Culture, blood (Routine X 2) w Reflex to ID Panel     Status: None (Preliminary result)   Collection Time: 12/05/20 12:02 AM   Specimen: BLOOD  Result Value Ref Range Status   Specimen Description BLOOD RIGHT HAND  Final   Special Requests   Final    BOTTLES DRAWN AEROBIC AND ANAEROBIC Blood Culture results may not be optimal due to an inadequate volume of blood received in culture bottles   Culture   Final    NO GROWTH 1 DAY Performed at [facility], [address], [place], [place] [address]    Report Status PENDING  Incomplete  Urine Culture     Status: None (Preliminary result)   Collection Time: 12/05/20 10:30 AM   Specimen: Urine, Clean Catch  Result Value Ref Range Status   Specimen Description   Final    URINE, CLEAN CATCH Performed at [facility], [address], [place], [place] [address]    Special Requests   Final    Normal Performed at [facility], [address], [place], [place] [address]    Culture   Final    CULTURE REINCUBATED FOR BETTER GROWTH Performed at [facility], [place] [address], [place], [place] [address]    Report Status PENDING  Incomplete         Radiology Studies: DG Chest 2 View  Result Date: 12/04/2020 CLINICAL DATA:  Shortness of breath.  Headache and nausea. EXAM:  CHEST - 2 VIEW COMPARISON:  08/23/2020 FINDINGS: Right IJ dialysis catheter distal tip: SVC. No pneumothorax or complicating feature. Bibasilar subsegmental atelectasis or scarring, left greater than right. Thoracic spondylosis. Mild enlargement of the cardiopericardial silhouette, without edema. No blunting of the costophrenic angles. IMPRESSION: 1. Right IJ dialysis catheter tip: Lower SVC. 2. Bibasilar subsegmental atelectasis versus scarring. 3. Mild enlargement of the cardiopericardial silhouette, without edema. Electronically Signed   By: Van Clines M.D.   On: 12/04/2020 18:25   CT Head Wo Contrast  Result Date: 12/04/2020 CLINICAL DATA:  Headache, unable to complete dialysis EXAM: CT HEAD WITHOUT CONTRAST TECHNIQUE: Contiguous axial images were obtained from the base of the skull through the  vertex without intravenous contrast. COMPARISON:  CT head 12/08/2019 FINDINGS: Brain: No evidence of acute infarction, hemorrhage, hydrocephalus, extra-axial collection, visible mass lesion or mass effect. Vascular: No hyperdense vessel or unexpected calcification. Skull: No calvarial fracture or suspicious osseous lesion. No scalp swelling or hematoma. Sinuses/Orbits: Minimal mural thickening in ethmoids and left maxillary sinus. Paranasal sinuses and mastoid air cells are otherwise predominantly clear. Included orbital structures are unremarkable. Other: None IMPRESSION: No acute intracranial abnormality. Electronically Signed   By: Lovena Le M.D.   On: 12/04/2020 23:39   CT Chest Wo Contrast  Result Date: 12/04/2020 CLINICAL DATA:  Shortness of breath, headache, nausea, unable to complete dialysis EXAM: CT CHEST WITHOUT CONTRAST TECHNIQUE: Multidetector CT imaging of the chest was performed following the standard protocol without IV contrast. COMPARISON:  Radiograph 12/04/2020 FINDINGS: Cardiovascular: Mild cardiomegaly. Trace pericardial effusion. Suboptimal assessment of the vasculature in the  absence of contrast media. No gross acute aortic abnormality. Normal 3 vessel branching of the aortic arch. Central pulmonary arteries are normal caliber. Tunneled right IJ approach dual lumen dialysis catheter tip terminates at the level of the right atrium. Mediastinum/Nodes: No mediastinal fluid or gas. Normal thyroid gland and thoracic inlet. No acute abnormality of the trachea or esophagus. No worrisome pathologically enlarged mediastinal or axillary adenopathy. Shotty subcentimeter mediastinal nodes are low-attenuation and may be reactive or edematous in nature. Hilar nodal evaluation is limited in the absence of intravenous contrast media. Lungs/Pleura: There are features most suggestive of developing interstitial pulmonary edema with peribronchovascular cuffing, diffuse interlobular septal thickening and fissural thickening with pulmonary vascular redistribution/cephalization. There is however a more focal rounded consolidative opacity seen in the superior segment of the left lower lobe measuring approximately 2.1 x 1.4 x 1.8 cm in size (3/75). Few small scattered sub 5 mm nodules subpleural nodules are seen elsewhere (3/88, 50, 35). No other focal airspace opacity is seen. Some bandlike areas. No pneumothorax. No layering pleural fluid. Upper Abdomen: Prior cholecystectomy. Asymmetric left perinephric stranding, nonspecific given underlying renal dysfunction. Musculoskeletal: No acute osseous abnormality or suspicious osseous lesion. No worrisome chest wall masses or lesions. Mild body wall edema. IMPRESSION: Focal region of consolidation versus masslike opacity present in the superior segment right lower lobe. Burtis Junes an acute infectious or inflammatory process though follow-up imaging should be considered in 6-8 weeks following therapeutic intervention to ensure resolution. Additional features of interstitial pulmonary edema throughout the lungs. With additional features of chronic overload including mild  cardiomegaly and pericardial effusion as well as diffuse body wall edema. Asymmetric left perinephric stranding, nonspecific in the setting of underlying renal dysfunction/dialysis. Could correlate with urinalysis if patient is able to produce urine in order to exclude an ascending tract infection. Electronically Signed   By: Lovena Le M.D.   On: 12/04/2020 23:47   ECHOCARDIOGRAM COMPLETE  Result Date: 12/06/2020    ECHOCARDIOGRAM REPORT   Patient Name:   TAWNY KEMERER Date of Exam: 12/05/2020 Medical Rec #:  [id number]      Height:       62.0 in Accession #:    [id number]     Weight:       262.3 lb Date of Birth:  [birth year]     BSA:          2.145 m Patient Age:    19 years       BP:           165/100 mmHg Patient Gender: F  HR:           71 bpm. Exam Location:  [facility] Procedure: 2D Echo Indications:     Fever  History:         Patient has no prior history of Echocardiogram examinations.                  Asthma; Risk Factors:ESRD, Hypertension and Diabetes.  Sonographer:     L Thornton-Maynard Referring Phys:  [id number] PATEL Diagnosing Phys: Neoma Laming MD  Sonographer Comments: Image acquisition challenging due to patient body habitus. IMPRESSIONS  1. Left ventricular ejection fraction, by estimation, is 55 to 60%. The left ventricle has normal function. The left ventricle has no regional wall motion abnormalities. There is mild concentric left ventricular hypertrophy. Left ventricular diastolic parameters are consistent with Grade I diastolic dysfunction (impaired relaxation).  2. Right ventricular systolic function is normal. The right ventricular size is normal.  3. The mitral valve is normal in structure. No evidence of mitral valve regurgitation. No evidence of mitral stenosis.  4. The aortic valve is normal in structure. Aortic valve regurgitation is not visualized. No aortic stenosis is present.  5. The inferior vena cava is normal in size with greater than 50% respiratory  variability, suggesting right atrial pressure of 3 mmHg. FINDINGS  Left Ventricle: Left ventricular ejection fraction, by estimation, is 55 to 60%. The left ventricle has normal function. The left ventricle has no regional wall motion abnormalities. The left ventricular internal cavity size was normal in size. There is  mild concentric left ventricular hypertrophy. Left ventricular diastolic parameters are consistent with Grade I diastolic dysfunction (impaired relaxation). Right Ventricle: The right ventricular size is normal. No increase in right ventricular wall thickness. Right ventricular systolic function is normal. Left Atrium: Left atrial size was normal in size. Right Atrium: Right atrial size was normal in size. Pericardium: There is no evidence of pericardial effusion. Mitral Valve: The mitral valve is normal in structure. No evidence of mitral valve regurgitation. No evidence of mitral valve stenosis. Tricuspid Valve: The tricuspid valve is normal in structure. Tricuspid valve regurgitation is not demonstrated. No evidence of tricuspid stenosis. Aortic Valve: The aortic valve is normal in structure. Aortic valve regurgitation is not visualized. No aortic stenosis is present. Aortic valve mean gradient measures 7.0 mmHg. Aortic valve peak gradient measures 11.0 mmHg. Aortic valve area, by VTI measures 2.76 cm. Pulmonic Valve: The pulmonic valve was normal in structure. Pulmonic valve regurgitation is not visualized. No evidence of pulmonic stenosis. Aorta: The aortic root is normal in size and structure. Venous: The inferior vena cava is normal in size with greater than 50% respiratory variability, suggesting right atrial pressure of 3 mmHg. IAS/Shunts: No atrial level shunt detected by color flow Doppler.  LEFT VENTRICLE PLAX 2D LVIDd:         5.01 cm  Diastology LVIDs:         3.45 cm  LV e' medial:    4.13 cm/s LV PW:         1.36 cm  LV E/e' medial:  24.5 LV IVS:        1.57 cm  LV e' lateral:   4.68  cm/s LVOT diam:     2.60 cm  LV E/e' lateral: 21.6 LV SV:         107 LV SV Index:   50 LVOT Area:     5.31 cm  RIGHT VENTRICLE RV S prime:  9.25 cm/s TAPSE (M-mode): 2.2 cm LEFT ATRIUM             Index LA diam:        2.60 cm 1.21 cm/m LA Vol (A2C):   68.4 ml 31.88 ml/m LA Vol (A4C):   51.2 ml 23.87 ml/m LA Biplane Vol: 60.8 ml 28.34 ml/m  AORTIC VALVE                    PULMONIC VALVE AV Area (Vmax):    2.63 cm     PV Vmax:       0.87 m/s AV Area (Vmean):   2.69 cm     PV Peak grad:  3.0 mmHg AV Area (VTI):     2.76 cm AV Vmax:           166.00 cm/s AV Vmean:          124.000 cm/s AV VTI:            0.387 m AV Peak Grad:      11.0 mmHg AV Mean Grad:      7.0 mmHg LVOT Vmax:         82.20 cm/s LVOT Vmean:        62.900 cm/s LVOT VTI:          0.201 m LVOT/AV VTI ratio: 0.52  AORTA Ao Root diam: 3.80 cm MITRAL VALVE MV Area (PHT): 2.66 cm     SHUNTS MV Decel Time: 285 msec     Systemic VTI:  0.20 m MV E velocity: 101.00 cm/s  Systemic Diam: 2.60 cm MV A velocity: 120.00 cm/s MV E/A ratio:  0.84 Neoma Laming MD Electronically signed by Neoma Laming MD Signature Date/Time: 12/06/2020/9:35:07 AM    Final     Scheduled Meds:  amLODipine  10 mg Oral Daily   aspirin EC  81 mg Oral Daily   heparin  5,000 Units Subcutaneous Q8H   hydrALAZINE  25 mg Oral TID   insulin aspart  0-9 Units Subcutaneous TID WC   Continuous Infusions:  cefTRIAXone (ROCEPHIN)  IV Stopped (12/06/20 0109)   remdesivir 100 mg in NS 100 mL 100 mg (12/06/20 1036)   [START ON 12/07/2020] vancomycin       LOS: 1 day    Time spent: 69mn  PDomenic Polite MD [facility]   12/06/2020, 12:54 PM

## 2020-12-07 ENCOUNTER — Ambulatory Visit (INDEPENDENT_AMBULATORY_CARE_PROVIDER_SITE_OTHER): Payer: Self-pay | Admitting: Vascular Surgery

## 2020-12-07 ENCOUNTER — Encounter (INDEPENDENT_AMBULATORY_CARE_PROVIDER_SITE_OTHER): Payer: Self-pay | Admitting: Vascular Surgery

## 2020-12-07 LAB — BASIC METABOLIC PANEL
Anion gap: 11 (ref 5–15)
BUN: 51 mg/dL — ABNORMAL HIGH (ref 6–20)
CO2: 19 mmol/L — ABNORMAL LOW (ref 22–32)
Calcium: 7 mg/dL — ABNORMAL LOW (ref 8.9–10.3)
Chloride: 101 mmol/L (ref 98–111)
Creatinine, Ser: 10.4 mg/dL — ABNORMAL HIGH (ref 0.44–1.00)
GFR, Estimated: 4 mL/min — ABNORMAL LOW (ref >60)
Glucose, Bld: 235 mg/dL — ABNORMAL HIGH (ref 70–99)
Potassium: 4.6 mmol/L (ref 3.5–5.1)
Sodium: 131 mmol/L — ABNORMAL LOW (ref 135–145)

## 2020-12-07 LAB — URINE CULTURE
Culture: 60000 — AB
Special Requests: NORMAL

## 2020-12-07 LAB — RENAL FUNCTION PANEL
Albumin: 2.5 g/dL — ABNORMAL LOW (ref 3.5–5.0)
Anion gap: 14 (ref 5–15)
BUN: 55 mg/dL — ABNORMAL HIGH (ref 6–20)
CO2: 17 mmol/L — ABNORMAL LOW (ref 22–32)
Calcium: 7 mg/dL — ABNORMAL LOW (ref 8.9–10.3)
Chloride: 101 mmol/L (ref 98–111)
Creatinine, Ser: 10.52 mg/dL — ABNORMAL HIGH (ref 0.44–1.00)
GFR, Estimated: 4 mL/min — ABNORMAL LOW (ref >60)
Glucose, Bld: 194 mg/dL — ABNORMAL HIGH (ref 70–99)
Phosphorus: 9.3 mg/dL — ABNORMAL HIGH (ref 2.5–4.6)
Potassium: 4.6 mmol/L (ref 3.5–5.1)
Sodium: 132 mmol/L — ABNORMAL LOW (ref 135–145)

## 2020-12-07 LAB — CBC
HCT: 28.6 % — ABNORMAL LOW (ref 36.0–46.0)
HCT: 28 % — ABNORMAL LOW (ref 36.0–46.0)
Hemoglobin: 10 g/dL — ABNORMAL LOW (ref 12.0–15.0)
Hemoglobin: 9.7 g/dL — ABNORMAL LOW (ref 12.0–15.0)
MCH: 30.9 pg (ref 26.0–34.0)
MCH: 30.6 pg (ref 26.0–34.0)
MCHC: 35 g/dL (ref 30.0–36.0)
MCHC: 34.6 g/dL (ref 30.0–36.0)
MCV: 88.3 fL (ref 80.0–100.0)
MCV: 88.3 fL (ref 80.0–100.0)
Platelets: 237 10*3/uL (ref 150–400)
Platelets: 228 10*3/uL (ref 150–400)
RBC: 3.24 MIL/uL — ABNORMAL LOW (ref 3.87–5.11)
RBC: 3.17 MIL/uL — ABNORMAL LOW (ref 3.87–5.11)
RDW: 13.3 % (ref 11.5–15.5)
RDW: 13.2 % (ref 11.5–15.5)
WBC: 5.5 10*3/uL (ref 4.0–10.5)
WBC: 5.2 10*3/uL (ref 4.0–10.5)
nRBC: 0 % (ref 0.0–0.2)
nRBC: 0 % (ref 0.0–0.2)

## 2020-12-07 LAB — GLUCOSE, CAPILLARY
Glucose-Capillary: 180 mg/dL — ABNORMAL HIGH (ref 70–99)
Glucose-Capillary: 151 mg/dL — ABNORMAL HIGH (ref 70–99)
Glucose-Capillary: 257 mg/dL — ABNORMAL HIGH (ref 70–99)

## 2020-12-07 LAB — HEMOGLOBIN A1C
Hgb A1c MFr Bld: 10.8 % — ABNORMAL HIGH (ref 4.8–5.6)
Mean Plasma Glucose: 263 mg/dL

## 2020-12-07 MED ORDER — HEPARIN SODIUM (PORCINE) 1000 UNIT/ML DIALYSIS
100.0000 [IU]/kg | INTRAMUSCULAR | Status: DC | PRN
  Filled 2020-12-07: qty 12

## 2020-12-07 MED ORDER — SODIUM CHLORIDE 0.9 % IV SOLN: 100.0000 mL | INTRAVENOUS | Status: DC | PRN

## 2020-12-07 MED ORDER — HEPARIN SODIUM (PORCINE) 1000 UNIT/ML DIALYSIS
1000.0000 [IU] | INTRAMUSCULAR | Status: DC | PRN
  Filled 2020-12-07: qty 1

## 2020-12-07 MED ORDER — LIDOCAINE HCL (PF) 1 % IJ SOLN
5.0000 mL | INTRAMUSCULAR | Status: DC | PRN
  Filled 2020-12-07: qty 5

## 2020-12-07 MED ORDER — LIDOCAINE-PRILOCAINE 2.5-2.5 % EX CREA
1.0000 "application " | TOPICAL_CREAM | CUTANEOUS | Status: DC | PRN
  Filled 2020-12-07: qty 5

## 2020-12-07 MED ORDER — CHLORHEXIDINE GLUCONATE CLOTH 2 % EX PADS: 6.0000 | MEDICATED_PAD | Freq: Every day | CUTANEOUS | Status: DC

## 2020-12-07 MED ORDER — NEPRO/CARBSTEADY PO LIQD: 237.0000 mL | Freq: Every day | ORAL | Status: DC

## 2020-12-07 MED ORDER — PENTAFLUOROPROP-TETRAFLUOROETH EX AERO
1.0000 "application " | INHALATION_SPRAY | CUTANEOUS | Status: DC | PRN
  Filled 2020-12-07: qty 30

## 2020-12-07 MED ORDER — CEFDINIR 300 MG PO CAPS: 300.0000 mg | ORAL_CAPSULE | Freq: Every day | ORAL | 0 refills | Status: AC

## 2020-12-07 MED ORDER — PROSOURCE PLUS PO LIQD
30.0000 mL | Freq: Two times a day (BID) | ORAL | Status: DC
  Administered 2020-12-07: 16:00:00 30 mL via ORAL
  Filled 2020-12-07: qty 30

## 2020-12-07 MED ORDER — ALTEPLASE 2 MG IJ SOLR
2.0000 mg | Freq: Once | INTRAMUSCULAR | Status: DC | PRN
  Filled 2020-12-07: qty 2

## 2020-12-07 MED ORDER — FOSFOMYCIN TROMETHAMINE 3 G PO PACK
3.0000 g | PACK | Freq: Once | ORAL | Status: AC
  Administered 2020-12-07: 3 g via ORAL
  Filled 2020-12-07: qty 3

## 2020-12-07 MED ORDER — RENA-VITE PO TABS
1.0000 | ORAL_TABLET | Freq: Every day | ORAL | Status: DC
  Filled 2020-12-07: qty 1

## 2020-12-07 MED ORDER — LORATADINE 10 MG PO TABS
10.0000 mg | ORAL_TABLET | Freq: Every day | ORAL | Status: DC
  Administered 2020-12-07: 13:00:00 10 mg via ORAL
  Filled 2020-12-07: qty 1

## 2020-12-07 NOTE — Discharge Summary (Signed)
Physician Discharge Summary  Percy Comp TKZ:[id number] DOB: 12/02/1975 DOA: 12/04/2020  PCP: System, Provider Not In  Admit date: 12/04/2020 Discharge date: 12/07/2020  Time spent: 35 minutes  Recommendations for Outpatient Follow-up:  Needs follow-up CT chest in 6 to 8 weeks PCP in 1 week Titrate antihypertensives for dialysis Needs to lose weight   Discharge Diagnoses:  Principal Problem:   Fever and chills SARS COVID-19 infection Abnormal CT chest-right lower lung-focal consolidation versus masslike opacity Enterococcus UTI/pyelonephritis   Type 2 diabetes mellitus with diabetic chronic kidney disease ([place])   End stage renal disease on dialysis [facility])   Essential hypertension   Sleep apnea   Chills with fever   COVID-19 virus infection Morbid obesity   Discharge Condition: Stable  Diet recommendation: Renal, diabetic  Filed Weights   12/04/20 1740  Weight: 119 kg    History of present illness:  45 year old female with history of ESRD on hemodialysis M WF, type 2 diabetes mellitus, morbid obesity, obstructive sleep apnea, hypertension, anemia of chronic disease presented to the ED from dialysis with headache fever chills vomiting and congestion, associated with body aches In the ED she was febrile. -CT chest noted focal region of consolidation versus masslike opacity present in the superior segment right lower lobe, favor to be an acute infectious, Asymmetric left perinephric stranding  Hospital Course:   SARS COVID-19 infection -Symptomatic with myalgias cough congestion, given multiple risk factors started on IV remdesivir, treated with 3 days of remdesivir, no evidence of dyspnea or hypoxia -No indication for steroids at this time, airborne precautions -Right lower lobe consolidation versus masslike opacity on CT chest is not consistent with COVID-pneumonia -Discharged home in a stable condition today, advised to complete isolation at home   Abnormal CT  chest, right lower lobe consolidation versus masslike opacity -This does not correspond with COVID pneumonia -Favored to be infectious -Treated with IV vancomycin and cefepime on admission, cultures negative thus far, transition to oral cefdinir, continue this for 4 more days - will need follow-up imaging in 6 weeks   Mild left perinephric stranding -No clear symptoms of UTI, still makes some urine, urinalysis was abnormal, treated with IV ceftriaxone initially, urine culture grew 60,000 colonies of Enterococcus faecalis, this was sensitive to ampicillin, she was treated with a dose of fosfomycin to complete course   ESRD on hemodialysis -Nephrology consulted, underwent dialysis today   Anemia of chronic disease   Type 2 diabetes mellitus -CBGs are stable, diet controlled at baseline   Hypertension -Improving with HD, Toprol resumed -Further titration per nephrology at dialysis  Discharge Exam: Vitals:   12/07/20 1230 12/07/20 1418  BP: (!) 462/82 (!) 164/89  Pulse: 72 71  Resp: 16 20  Temp:  98.8 F (37.1 C)  SpO2: 100% 98%    General: AAOx3 Cardiovascular: S1S2/RRR Respiratory: CTAB  Discharge Instructions   Discharge Instructions     Diet - low sodium heart healthy   Complete by: As directed    Discharge wound care:   Complete by: As directed    routine   Increase activity slowly   Complete by: As directed       Allergies as of 12/07/2020       Reactions   Statins    Other reaction(s): Other (See Comments) "redness and facial swelling"   Cholesterol Rash   Cholesterol medicine        Medication List     STOP taking these medications    amLODipine 10 MG tablet Commonly  known as: NORVASC   sertraline 50 MG tablet Commonly known as: ZOLOFT       TAKE these medications    acetaminophen 500 MG tablet Commonly known as: TYLENOL Take 500-1,000 mg by mouth every 6 (six) hours as needed (pain).   Blood Pressure Kit USE AS DIRECTED.    CALCITRIOL IV Inject 2 mcg into the vein 3 (three) times a week.   cefdinir 300 MG capsule Commonly known as: OMNICEF Take 1 capsule (300 mg total) by mouth daily for 3 days.   diphenhydrAMINE 25 MG tablet Commonly known as: BENADRYL Take 25 mg by mouth every 6 (six) hours as needed. Taking as needed   epoetin alfa 10000 UNIT/ML injection Commonly known as: EPOGEN Inject 1,400 Units into the vein 3 (three) times a week.   HYDROcodone-acetaminophen 5-325 MG tablet Commonly known as: Norco Take 1 tablet by mouth every 6 (six) hours as needed for moderate pain.   metoprolol succinate 50 MG 24 hr tablet Commonly known as: TOPROL-XL Take 1 tablet (50 mg total) by mouth daily. Take with or immediately following a meal.   VENOFER IV Inject 100 mg into the vein 3 (three) times a week.       ASK your doctor about these medications    calcium carbonate 500 MG chewable tablet Commonly known as: TUMS - dosed in mg elemental calcium Chew 1 tablet (200 mg of elemental calcium total) by mouth 3 (three) times daily with meals.   polyethylene glycol 17 g packet Commonly known as: MIRALAX / GLYCOLAX Mix 17 g with 8oz of water and take by mouth 2 (two) times daily.               Discharge Care Instructions  (From admission, onward)           Start     Ordered   12/07/20 0000  Discharge wound care:       Comments: routine   12/07/20 1527           Allergies  Allergen Reactions   Statins     Other reaction(s): Other (See Comments) "redness and facial swelling"   Cholesterol Rash    Cholesterol medicine      The results of significant diagnostics from this hospitalization (including imaging, microbiology, ancillary and laboratory) are listed below for reference.    Significant Diagnostic Studies: DG Chest 2 View  Result Date: 12/04/2020 CLINICAL DATA:  Shortness of breath.  Headache and nausea. EXAM: CHEST - 2 VIEW COMPARISON:  08/23/2020 FINDINGS: Right IJ  dialysis catheter distal tip: SVC. No pneumothorax or complicating feature. Bibasilar subsegmental atelectasis or scarring, left greater than right. Thoracic spondylosis. Mild enlargement of the cardiopericardial silhouette, without edema. No blunting of the costophrenic angles. IMPRESSION: 1. Right IJ dialysis catheter tip: Lower SVC. 2. Bibasilar subsegmental atelectasis versus scarring. 3. Mild enlargement of the cardiopericardial silhouette, without edema. Electronically Signed   By: Van Clines M.D.   On: 12/04/2020 18:25   CT Head Wo Contrast  Result Date: 12/04/2020 CLINICAL DATA:  Headache, unable to complete dialysis EXAM: CT HEAD WITHOUT CONTRAST TECHNIQUE: Contiguous axial images were obtained from the base of the skull through the vertex without intravenous contrast. COMPARISON:  CT head 12/08/2019 FINDINGS: Brain: No evidence of acute infarction, hemorrhage, hydrocephalus, extra-axial collection, visible mass lesion or mass effect. Vascular: No hyperdense vessel or unexpected calcification. Skull: No calvarial fracture or suspicious osseous lesion. No scalp swelling or hematoma. Sinuses/Orbits: Minimal mural thickening in ethmoids and  left maxillary sinus. Paranasal sinuses and mastoid air cells are otherwise predominantly clear. Included orbital structures are unremarkable. Other: None IMPRESSION: No acute intracranial abnormality. Electronically Signed   By: Lovena Le M.D.   On: 12/04/2020 23:39   CT Chest Wo Contrast  Result Date: 12/04/2020 CLINICAL DATA:  Shortness of breath, headache, nausea, unable to complete dialysis EXAM: CT CHEST WITHOUT CONTRAST TECHNIQUE: Multidetector CT imaging of the chest was performed following the standard protocol without IV contrast. COMPARISON:  Radiograph 12/04/2020 FINDINGS: Cardiovascular: Mild cardiomegaly. Trace pericardial effusion. Suboptimal assessment of the vasculature in the absence of contrast media. No gross acute aortic abnormality.  Normal 3 vessel branching of the aortic arch. Central pulmonary arteries are normal caliber. Tunneled right IJ approach dual lumen dialysis catheter tip terminates at the level of the right atrium. Mediastinum/Nodes: No mediastinal fluid or gas. Normal thyroid gland and thoracic inlet. No acute abnormality of the trachea or esophagus. No worrisome pathologically enlarged mediastinal or axillary adenopathy. Shotty subcentimeter mediastinal nodes are low-attenuation and may be reactive or edematous in nature. Hilar nodal evaluation is limited in the absence of intravenous contrast media. Lungs/Pleura: There are features most suggestive of developing interstitial pulmonary edema with peribronchovascular cuffing, diffuse interlobular septal thickening and fissural thickening with pulmonary vascular redistribution/cephalization. There is however a more focal rounded consolidative opacity seen in the superior segment of the left lower lobe measuring approximately 2.1 x 1.4 x 1.8 cm in size (3/75). Few small scattered sub 5 mm nodules subpleural nodules are seen elsewhere (3/88, 50, 35). No other focal airspace opacity is seen. Some bandlike areas. No pneumothorax. No layering pleural fluid. Upper Abdomen: Prior cholecystectomy. Asymmetric left perinephric stranding, nonspecific given underlying renal dysfunction. Musculoskeletal: No acute osseous abnormality or suspicious osseous lesion. No worrisome chest wall masses or lesions. Mild body wall edema. IMPRESSION: Focal region of consolidation versus masslike opacity present in the superior segment right lower lobe. Burtis Junes an acute infectious or inflammatory process though follow-up imaging should be considered in 6-8 weeks following therapeutic intervention to ensure resolution. Additional features of interstitial pulmonary edema throughout the lungs. With additional features of chronic overload including mild cardiomegaly and pericardial effusion as well as diffuse body  wall edema. Asymmetric left perinephric stranding, nonspecific in the setting of underlying renal dysfunction/dialysis. Could correlate with urinalysis if patient is able to produce urine in order to exclude an ascending tract infection. Electronically Signed   By: Lovena Le M.D.   On: 12/04/2020 23:47   ECHOCARDIOGRAM COMPLETE  Result Date: 12/06/2020    ECHOCARDIOGRAM REPORT   Patient Name:   JAMIYLA ISHEE Date of Exam: 12/05/2020 Medical Rec #:  [id number]      Height:       62.0 in Accession #:    [id number]     Weight:       262.3 lb Date of Birth:  1975-10-24     BSA:          2.145 m Patient Age:    15 years       BP:           165/100 mmHg Patient Gender: F              HR:           71 bpm. Exam Location:  [facility] Procedure: 2D Echo Indications:     Fever  History:         Patient has no prior history of Echocardiogram examinations.  Asthma; Risk Factors:ESRD, Hypertension and Diabetes.  Sonographer:     L Thornton-Maynard Referring Phys:  [id number] QAES L PATEL Diagnosing Phys: Neoma Laming MD  Sonographer Comments: Image acquisition challenging due to patient body habitus. IMPRESSIONS  1. Left ventricular ejection fraction, by estimation, is 55 to 60%. The left ventricle has normal function. The left ventricle has no regional wall motion abnormalities. There is mild concentric left ventricular hypertrophy. Left ventricular diastolic parameters are consistent with Grade I diastolic dysfunction (impaired relaxation).  2. Right ventricular systolic function is normal. The right ventricular size is normal.  3. The mitral valve is normal in structure. No evidence of mitral valve regurgitation. No evidence of mitral stenosis.  4. The aortic valve is normal in structure. Aortic valve regurgitation is not visualized. No aortic stenosis is present.  5. The inferior vena cava is normal in size with greater than 50% respiratory variability, suggesting right atrial pressure of 3 mmHg. FINDINGS  Left  Ventricle: Left ventricular ejection fraction, by estimation, is 55 to 60%. The left ventricle has normal function. The left ventricle has no regional wall motion abnormalities. The left ventricular internal cavity size was normal in size. There is  mild concentric left ventricular hypertrophy. Left ventricular diastolic parameters are consistent with Grade I diastolic dysfunction (impaired relaxation). Right Ventricle: The right ventricular size is normal. No increase in right ventricular wall thickness. Right ventricular systolic function is normal. Left Atrium: Left atrial size was normal in size. Right Atrium: Right atrial size was normal in size. Pericardium: There is no evidence of pericardial effusion. Mitral Valve: The mitral valve is normal in structure. No evidence of mitral valve regurgitation. No evidence of mitral valve stenosis. Tricuspid Valve: The tricuspid valve is normal in structure. Tricuspid valve regurgitation is not demonstrated. No evidence of tricuspid stenosis. Aortic Valve: The aortic valve is normal in structure. Aortic valve regurgitation is not visualized. No aortic stenosis is present. Aortic valve mean gradient measures 7.0 mmHg. Aortic valve peak gradient measures 11.0 mmHg. Aortic valve area, by VTI measures 2.76 cm. Pulmonic Valve: The pulmonic valve was normal in structure. Pulmonic valve regurgitation is not visualized. No evidence of pulmonic stenosis. Aorta: The aortic root is normal in size and structure. Venous: The inferior vena cava is normal in size with greater than 50% respiratory variability, suggesting right atrial pressure of 3 mmHg. IAS/Shunts: No atrial level shunt detected by color flow Doppler.  LEFT VENTRICLE PLAX 2D LVIDd:         5.01 cm  Diastology LVIDs:         3.45 cm  LV e' medial:    4.13 cm/s LV PW:         1.36 cm  LV E/e' medial:  24.5 LV IVS:        1.57 cm  LV e' lateral:   4.68 cm/s LVOT diam:     2.60 cm  LV E/e' lateral: 21.6 LV SV:         107  LV SV Index:   50 LVOT Area:     5.31 cm  RIGHT VENTRICLE RV S prime:     9.25 cm/s TAPSE (M-mode): 2.2 cm LEFT ATRIUM             Index LA diam:        2.60 cm 1.21 cm/m LA Vol (A2C):   68.4 ml 31.88 ml/m LA Vol (A4C):   51.2 ml 23.87 ml/m LA Biplane Vol: 60.8 ml 28.34 ml/m  AORTIC  VALVE                    PULMONIC VALVE AV Area (Vmax):    2.63 cm     PV Vmax:       0.87 m/s AV Area (Vmean):   2.69 cm     PV Peak grad:  3.0 mmHg AV Area (VTI):     2.76 cm AV Vmax:           166.00 cm/s AV Vmean:          124.000 cm/s AV VTI:            0.387 m AV Peak Grad:      11.0 mmHg AV Mean Grad:      7.0 mmHg LVOT Vmax:         82.20 cm/s LVOT Vmean:        62.900 cm/s LVOT VTI:          0.201 m LVOT/AV VTI ratio: 0.52  AORTA Ao Root diam: 3.80 cm MITRAL VALVE MV Area (PHT): 2.66 cm     SHUNTS MV Decel Time: 285 msec     Systemic VTI:  0.20 m MV E velocity: 101.00 cm/s  Systemic Diam: 2.60 cm MV A velocity: 120.00 cm/s MV E/A ratio:  0.84 Neoma Laming MD Electronically signed by Neoma Laming MD Signature Date/Time: 12/06/2020/9:35:07 AM    Final     Microbiology: Recent Results (from the past 240 hour(s))  Resp Panel by RT-PCR (Flu A&B, Covid) Nasopharyngeal Swab     Status: Abnormal   Collection Time: 12/04/20 10:53 PM   Specimen: Nasopharyngeal Swab; Nasopharyngeal(NP) swabs in vial transport medium  Result Value Ref Range Status   SARS Coronavirus 2 by RT PCR POSITIVE (A) NEGATIVE Final    Comment: RESULT CALLED TO, READ BACK BY AND VERIFIED WITH: MELANIE FOWLER @ 1540 12/05/20 LFD (NOTE) SARS-CoV-2 target nucleic acids are DETECTED.  The SARS-CoV-2 RNA is generally detectable in upper respiratory specimens during the acute phase of infection. Positive results are indicative of the presence of the identified virus, but do not rule out bacterial infection or co-infection with other pathogens not detected by the test. Clinical correlation with patient history and other diagnostic information is  necessary to determine patient infection status. The expected result is Negative.  Fact Sheet for Patients: EntrepreneurPulse.com.au  Fact Sheet for Healthcare Providers: IncredibleEmployment.be  This test is not yet approved or cleared by the [place] FDA and  has been authorized for detection and/or diagnosis of SARS-CoV-2 by FDA under an Emergency Use Authorization (EUA).  This EUA will remain in effect (meaning this test can b e used) for the duration of  the COVID-19 declaration under Section 564(b)(1) of the Act, 21 U.S.C. section 360bbb-3(b)(1), unless the authorization is terminated or revoked sooner.     Influenza A by PCR NEGATIVE NEGATIVE Final   Influenza B by PCR NEGATIVE NEGATIVE Final    Comment: (NOTE) The Xpert Xpress SARS-CoV-2/FLU/RSV plus assay is intended as an aid in the diagnosis of influenza from Nasopharyngeal swab specimens and should not be used as a sole basis for treatment. Nasal washings and aspirates are unacceptable for Xpert Xpress SARS-CoV-2/FLU/RSV testing.  Fact Sheet for Patients: EntrepreneurPulse.com.au  Fact Sheet for Healthcare Providers: IncredibleEmployment.be  This test is not yet approved or cleared by the [place] FDA and has been authorized for detection and/or diagnosis of SARS-CoV-2 by FDA under an Emergency Use Authorization (EUA). This EUA will  remain in effect (meaning this test can be used) for the duration of the COVID-19 declaration under Section 564(b)(1) of the Act, 21 U.S.C. section 360bbb-3(b)(1), unless the authorization is terminated or revoked.  Performed at [facility], [address] [place], [place] [address]   Culture, blood (Routine X 2) w Reflex to ID Panel     Status: None (Preliminary result)   Collection Time: 12/04/20 10:53 PM   Specimen: BLOOD  Result Value Ref Range Status   Specimen Description BLOOD  RIGHT ANTECUBITAL  Final   Special Requests   Final    BOTTLES DRAWN AEROBIC AND ANAEROBIC Blood Culture adequate volume   Culture   Final    NO GROWTH 3 DAYS Performed at [facility], [address] [place], [place] [address]    Report Status PENDING  Incomplete  Culture, blood (Routine X 2) w Reflex to ID Panel     Status: None (Preliminary result)   Collection Time: 12/05/20 12:02 AM   Specimen: BLOOD  Result Value Ref Range Status   Specimen Description BLOOD RIGHT HAND  Final   Special Requests   Final    BOTTLES DRAWN AEROBIC AND ANAEROBIC Blood Culture results may not be optimal due to an inadequate volume of blood received in culture bottles   Culture   Final    NO GROWTH 2 DAYS Performed at [facility], [address] [place], [place] [address]    Report Status PENDING  Incomplete  Urine Culture     Status: Abnormal   Collection Time: 12/05/20 10:30 AM   Specimen: Urine, Clean Catch  Result Value Ref Range Status   Specimen Description   Final    URINE, CLEAN CATCH Performed at [facility], [address] [place], [place] [address]    Special Requests   Final    Normal Performed at [facility], [address] [place], [place] [address]    Culture 60,000 COLONIES/mL ENTEROCOCCUS FAECALIS (A)  Final   Report Status 12/07/2020 FINAL  Final   Organism ID, Bacteria ENTEROCOCCUS FAECALIS (A)  Final      Susceptibility   Enterococcus faecalis - MIC*    AMPICILLIN <=2 SENSITIVE Sensitive     NITROFURANTOIN <=16 SENSITIVE Sensitive     VANCOMYCIN 1 SENSITIVE Sensitive     * 60,000 COLONIES/mL ENTEROCOCCUS FAECALIS     Labs: Basic Metabolic Panel: Recent Labs  Lab 12/04/20 1746 12/05/20 0451 12/06/20 0636 12/07/20 0442 12/07/20 0750  NA 131* 131* 130* 131* 132*  K 4.0 4.3 4.5 4.6 4.6  CL 101 100 98 101 101  CO2 22 24 21* 19* 17*  GLUCOSE 183* 210* 182* 235* 194*  BUN 15 20 37* 51* 55*  CREATININE 4.81* 5.90*  8.45* 10.40* 10.52*  CALCIUM 8.0* 7.6* 7.8* 7.0* 7.0*  PHOS  --   --   --   --  9.3*   Liver Function Tests: Recent Labs  Lab 12/04/20 2253 12/05/20 0451 12/07/20 0750  AST 21 17  --   ALT 12 9  --   ALKPHOS 106 94  --   BILITOT 0.6 0.8  --   PROT 6.7 5.9*  --   ALBUMIN 3.1* 2.7* 2.5*   Recent Labs  Lab 12/04/20 2253  LIPASE 36   No results for input(s): AMMONIA in the last 168 hours. CBC: Recent Labs  Lab 12/04/20 1746 12/05/20 0451 12/06/20 0636 12/07/20 0442 12/07/20 0750  WBC 9.6 6.4 5.7 5.5 5.2  HGB 10.4* 9.3* 10.0* 10.0* 9.7*  HCT 30.2* 27.9* 29.8* 28.6* 28.0*  MCV 87.8 88.3 88.2 88.3 88.3  PLT 273 239 239 237 228   Cardiac Enzymes: No results for input(s): CKTOTAL, CKMB, CKMBINDEX, TROPONINI in the last 168 hours. BNP: BNP (last 3 results) No results for input(s): BNP in the last 8760 hours.  ProBNP (last 3 results) No results for input(s): PROBNP in the last 8760 hours.  CBG: Recent Labs  Lab 12/06/20 1144 12/06/20 1614 12/06/20 2051 12/07/20 0810 12/07/20 1241  GLUCAP 180* 180* 220* 180* 151*   Signed:  Domenic Polite MD.  [facility] 12/07/2020, 3:37 PM

## 2020-12-07 NOTE — Plan of Care (Signed)

## 2020-12-07 NOTE — Progress Notes (Signed)
Tolerated entire HD treatment without incident.  UF goal of 1L met. No c/o offered, report givent o primary nurse. COVID precautions maintained

## 2020-12-07 NOTE — Progress Notes (Signed)
[facility]  PROGRESS NOTE   Subjective:   Patient seen laying in bed Preparing to have dialysis at bedside Alert and oriented Tolerating meals, denies nausea and vomiting Afebrile, denies shortness of breath  Patient seen during dialysis   HEMODIALYSIS FLOWSHEET:  Blood Flow Rate (mL/min): 400 mL/min Arterial Pressure (mmHg): -150 mmHg Venous Pressure (mmHg): 140 mmHg Transmembrane Pressure (mmHg): 60 mmHg Ultrafiltration Rate (mL/min): 600 mL/min Dialysate Flow Rate (mL/min): 500 ml/min Conductivity: Machine : 14 Conductivity: Machine : 14 Dialysis Fluid Bolus: Normal Saline Bolus Amount (mL): 200 mL   Objective:  Vital signs in last 24 hours:  Temp:  [98.3 F (36.8 C)-99.7 F (37.6 C)] 98.3 F (36.8 C) (07/25 0808) Pulse Rate:  [66-75] 70 (07/25 0808) Resp:  [16-20] 16 (07/25 0808) BP: (146-176)/(84-94) 165/92 (07/25 0808) SpO2:  [95 %-100 %] 100 % (07/25 0808)  Weight change:  Filed Weights   12/04/20 1740  Weight: 119 kg    Intake/Output: I/O last 3 completed shifts: In: 200 [IV Piggyback:200] Out: -    Intake/Output this shift:  No intake/output data recorded.  Physical Exam: General:  No acute distress  Head:  Normocephalic, atraumatic. Moist oral mucosal membranes  Eyes:  Anicteric  Lungs:   Clear to auscultation, normal effort  Heart:  S1S2 no rubs  Abdomen:   Soft, nontender, bowel sounds present  Extremities:  peripheral edema.  Neurologic:  Awake, alert, following commands  Skin:  No lesions  Access: Rt Permcath    Basic Metabolic Panel: Recent Labs  Lab 12/04/20 1746 12/05/20 0451 12/06/20 0636 12/07/20 0442 12/07/20 0750  NA 131* 131* 130* 131* 132*  K 4.0 4.3 4.5 4.6 4.6  CL 101 100 98 101 101  CO2 22 24 21* 19* 17*  GLUCOSE 183* 210* 182* 235* 194*  BUN 15 20 37* 51* 55*  CREATININE 4.81* 5.90* 8.45* 10.40* 10.52*  CALCIUM 8.0* 7.6* 7.8* 7.0* 7.0*  PHOS  --   --   --   --  9.3*     CBC: Recent Labs   Lab 12/04/20 1746 12/05/20 0451 12/06/20 0636 12/07/20 0442 12/07/20 0750  WBC 9.6 6.4 5.7 5.5 5.2  HGB 10.4* 9.3* 10.0* 10.0* 9.7*  HCT 30.2* 27.9* 29.8* 28.6* 28.0*  MCV 87.8 88.3 88.2 88.3 88.3  PLT 273 239 239 237 228      Urinalysis: Recent Labs    12/05/20 1030  COLORURINE YELLOW*  LABSPEC 1.023  PHURINE 8.0  GLUCOSEU >=500*  HGBUR NEGATIVE  BILIRUBINUR NEGATIVE  KETONESUR NEGATIVE  PROTEINUR >=300*  NITRITE NEGATIVE  LEUKOCYTESUR NEGATIVE       Imaging: ECHOCARDIOGRAM COMPLETE  Result Date: 12/06/2020    ECHOCARDIOGRAM REPORT   Patient Name:   [name] Date of Exam: 12/05/2020 Medical Rec #:  [id number]      Height:       62.0 in Accession #:    [id number]     Weight:       262.3 lb Date of Birth:  July 24, 1975     BSA:          2.145 m Patient Age:    45 years       BP:           165/100 mmHg Patient Gender: F              HR:           71 bpm. Exam Location:  [facility] Procedure: 2D Echo Indications:  Fever  History:         Patient has no prior history of Echocardiogram examinations.                  Asthma; Risk Factors:ESRD, Hypertension and Diabetes.  Sonographer:     L Thornton-Maynard Referring Phys:  [id number] PATEL Diagnosing Phys: Neoma Laming MD  Sonographer Comments: Image acquisition challenging due to patient body habitus. IMPRESSIONS  1. Left ventricular ejection fraction, by estimation, is 55 to 60%. The left ventricle has normal function. The left ventricle has no regional wall motion abnormalities. There is mild concentric left ventricular hypertrophy. Left ventricular diastolic parameters are consistent with Grade I diastolic dysfunction (impaired relaxation).  2. Right ventricular systolic function is normal. The right ventricular size is normal.  3. The mitral valve is normal in structure. No evidence of mitral valve regurgitation. No evidence of mitral stenosis.  4. The aortic valve is normal in structure. Aortic valve regurgitation is not  visualized. No aortic stenosis is present.  5. The inferior vena cava is normal in size with greater than 50% respiratory variability, suggesting right atrial pressure of 3 mmHg. FINDINGS  Left Ventricle: Left ventricular ejection fraction, by estimation, is 55 to 60%. The left ventricle has normal function. The left ventricle has no regional wall motion abnormalities. The left ventricular internal cavity size was normal in size. There is  mild concentric left ventricular hypertrophy. Left ventricular diastolic parameters are consistent with Grade I diastolic dysfunction (impaired relaxation). Right Ventricle: The right ventricular size is normal. No increase in right ventricular wall thickness. Right ventricular systolic function is normal. Left Atrium: Left atrial size was normal in size. Right Atrium: Right atrial size was normal in size. Pericardium: There is no evidence of pericardial effusion. Mitral Valve: The mitral valve is normal in structure. No evidence of mitral valve regurgitation. No evidence of mitral valve stenosis. Tricuspid Valve: The tricuspid valve is normal in structure. Tricuspid valve regurgitation is not demonstrated. No evidence of tricuspid stenosis. Aortic Valve: The aortic valve is normal in structure. Aortic valve regurgitation is not visualized. No aortic stenosis is present. Aortic valve mean gradient measures 7.0 mmHg. Aortic valve peak gradient measures 11.0 mmHg. Aortic valve area, by VTI measures 2.76 cm. Pulmonic Valve: The pulmonic valve was normal in structure. Pulmonic valve regurgitation is not visualized. No evidence of pulmonic stenosis. Aorta: The aortic root is normal in size and structure. Venous: The inferior vena cava is normal in size with greater than 50% respiratory variability, suggesting right atrial pressure of 3 mmHg. IAS/Shunts: No atrial level shunt detected by color flow Doppler.  LEFT VENTRICLE PLAX 2D LVIDd:         5.01 cm  Diastology LVIDs:         3.45  cm  LV e' medial:    4.13 cm/s LV PW:         1.36 cm  LV E/e' medial:  24.5 LV IVS:        1.57 cm  LV e' lateral:   4.68 cm/s LVOT diam:     2.60 cm  LV E/e' lateral: 21.6 LV SV:         107 LV SV Index:   50 LVOT Area:     5.31 cm  RIGHT VENTRICLE RV S prime:     9.25 cm/s TAPSE (M-mode): 2.2 cm LEFT ATRIUM             Index LA diam:  2.60 cm 1.21 cm/m LA Vol (A2C):   68.4 ml 31.88 ml/m LA Vol (A4C):   51.2 ml 23.87 ml/m LA Biplane Vol: 60.8 ml 28.34 ml/m  AORTIC VALVE                    PULMONIC VALVE AV Area (Vmax):    2.63 cm     PV Vmax:       0.87 m/s AV Area (Vmean):   2.69 cm     PV Peak grad:  3.0 mmHg AV Area (VTI):     2.76 cm AV Vmax:           166.00 cm/s AV Vmean:          124.000 cm/s AV VTI:            0.387 m AV Peak Grad:      11.0 mmHg AV Mean Grad:      7.0 mmHg LVOT Vmax:         82.20 cm/s LVOT Vmean:        62.900 cm/s LVOT VTI:          0.201 m LVOT/AV VTI ratio: 0.52  AORTA Ao Root diam: 3.80 cm MITRAL VALVE MV Area (PHT): 2.66 cm     SHUNTS MV Decel Time: 285 msec     Systemic VTI:  0.20 m MV E velocity: 101.00 cm/s  Systemic Diam: 2.60 cm MV A velocity: 120.00 cm/s MV E/A ratio:  0.84 Neoma Laming MD Electronically signed by Neoma Laming MD Signature Date/Time: 12/06/2020/9:35:07 AM    Final      Medications:    sodium chloride     sodium chloride     remdesivir 100 mg in NS 100 mL Stopped (12/06/20 1111)    amLODipine  10 mg Oral Daily   aspirin EC  81 mg Oral Daily   cefdinir  300 mg Oral Once per day on Sun Mon Wed Fri   Chlorhexidine Gluconate Cloth  6 each Topical Q0600   Chlorhexidine Gluconate Cloth  6 each Topical Daily   heparin  5,000 Units Subcutaneous Q8H   hydrALAZINE  25 mg Oral TID   insulin aspart  0-9 Units Subcutaneous TID WC   loratadine  10 mg Oral Daily   polyethylene glycol  17 g Oral Daily    Assessment/ Plan:     Principal Problem:   Fever and chills Active Problems:   Type 2 diabetes mellitus with diabetic chronic kidney  disease (HCC)   End stage renal disease on dialysis [facility])   Essential hypertension   Sleep apnea   Chills with fever   COVID-19 virus infection  45 year old female with history of asthma, end-stage renal disease on dialysis, hypertension, diabetes, depression now admitted with history of fevers and found to have COVID-positive.  She has history of ESRD and has been on dialysis 3 times a week with Monday Wednesday Friday schedule.  She was last dialyzed yesterday.  Presently she denies any chest pain, shortness of breath.  She was given a dose of vancomycin and also she is on remdesivir.   ESRD-Receiving dialysis today. UF goal 2L as tolerated. UF 1L achieved. Next treatment scheduled for Wednesday.  ANEMIA-we will follow anemia protocols. Hemoglobin & Hematocrit     Component Value Date/Time   HGB 9.7 (L) 12/07/2020 0750   HCT 28.0 (L) 12/07/2020 0750   Hgb within acceptable range Will monitor  Secondary Hyperparathyroidism:  Lab Results  Component Value Date  CALCIUM 7.0 (L) 12/07/2020   CAION 1.10 (L) 11/20/2020   PHOS 9.3 (H) 12/07/2020  Phosphorus and calcium not at goal Calcium carbonate with meals   HTN/VOL elevated 180/83, Currently on Amlodipine and hydralazine.  OTHER COVID-positive: Treatment protocols noted. Positive on 12/04/20 Remdesivir ordered   Patient is advised to stay on fluid restriction to 1 L a day.    LOS: 2 Colon Flattery, MD [facility] 7/25/202210:38 AM

## 2020-12-07 NOTE — TOC Progression Note (Signed)
Transition of Care [facility]) - Progression Note    Patient Details  Name: [name] MRN: [id number] Date of Birth: Oct 08, 1975  Transition of Care [facility]) CM/SW Contact  Shelbie Hutching, RN Phone Number: 12/07/2020, 3:43 PM  Clinical Narrative:    Patient does not have a ride home.  RNCM called [facility] [phone].  Transportation set up for 5pm.  Transport will call the nursing unit when they get close.          Expected Discharge Plan and Services           Expected Discharge Date: 12/07/20                                     Social Determinants of Health (SDOH) Interventions    Readmission Risk Interventions Readmission Risk Prevention Plan 12/07/2020  Transportation Screening Complete  PCP or Specialist Appt within 3-5 Days Complete  HRI or [place] Complete  Social Work Consult for [place] Planning/Counseling Complete  Palliative Care Screening Not Applicable  Medication Review Press photographer) Complete
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
## 2020-12-07 NOTE — Anesthesia Postprocedure Evaluation (Signed)
Anesthesia Post Note  Patient: [name]  Procedure(s) Performed: ARTERIOVENOUS (AV) FISTULA CREATION ( BRACHIAL CEPHALIC ) (Left)  Patient location during evaluation: PACU Anesthesia Type: General Level of consciousness: awake and alert Pain management: pain level controlled Vital Signs Assessment: post-procedure vital signs reviewed and stable Respiratory status: spontaneous breathing, nonlabored ventilation, respiratory function stable and patient connected to nasal cannula oxygen Cardiovascular status: blood pressure returned to baseline and stable Postop Assessment: no apparent nausea or vomiting Anesthetic complications: no   No notable events documented.   Last Vitals:  Vitals:   11/20/20 1613 11/20/20 1715  BP: (!) 119/49 (!) 112/50  Pulse: 100   Resp: 20 20  Temp: (!) 36.1 C   SpO2: 96% 95%    Last Pain:  Vitals:   11/23/20 0840  TempSrc:   PainSc: [place]

## 2020-12-07 NOTE — Progress Notes (Signed)
Hemodialysis patient known at [facility] MWF. Contacted clinic about COVID positive results, clinic stated that patient will continue to treat on same days only at 11:45am. Patient is instructed to wait in car and call clinic upon arrival.

## 2020-12-07 NOTE — Progress Notes (Signed)
Initial Nutrition Assessment  DOCUMENTATION CODES:   Obesity unspecified  INTERVENTION:   30 ml ProSource Plus BID, each supplement provides 100 kcals and 15 grams protein.   Nepro Shake po daily, each supplement provides 425 kcal and 19 grams protein  Add Renal MVI daily  Recommend addition of phosphorus binder   NUTRITION DIAGNOSIS:   Increased nutrient needs related to acute illness, chronic illness as evidenced by estimated needs.  GOAL:   Patient will meet greater than or equal to 90% of their needs  MONITOR:   PO intake, Supplement acceptance, Labs, Weight trends  REASON FOR ASSESSMENT:   Malnutrition Screening Tool    ASSESSMENT:   45 yo female admitted with symptomatic COVID-19 infection. PMH includes ESRD/HD, DM, HTN  Received iHD today with 1L UF  No recorded po intake but per chart review, pt is reportedly eating well  Current wt 119 kg; unsure of dry weight  Phosphorus quite elevated; no phosphorus binder ordered at present. Noted Tums ordered with meals as outpatient; recommend restarting phosphorus binder therapy  Labs: phosphorus 9.3 (H) Meds reviewed    Diet Order:   Diet Order             Diet renal/carb modified with fluid restriction Diet-HS Snack? Nothing; Fluid restriction: 1200 mL Fluid; Room service appropriate? Yes; Fluid consistency: Thin  Diet effective now                   EDUCATION NEEDS:   Education needs have been addressed  Skin:  Skin Assessment: Reviewed RN Assessment  Last BM:  7/21  Height:   Ht Readings from Last 1 Encounters:  12/04/20 '5\' 2"'$  (1.575 m)    Weight:   Wt Readings from Last 1 Encounters:  12/04/20 119 kg    BMI:  Body mass index is 47.98 kg/m.  Estimated Nutritional Needs:   Kcal:  2000-2200 kcals  Protein:  115-130 g  Fluid:  1000 mL plus UOP   Kerman Passey MS, RDN, LDN, CNSC Registered Dietitian III Clinical Nutrition RD Pager and On-Call Pager Number Located in [place]

## 2020-12-09 LAB — CULTURE, BLOOD (ROUTINE X 2)
Culture: NO GROWTH
Special Requests: ADEQUATE

## 2020-12-10 ENCOUNTER — Ambulatory Visit: Payer: Self-pay | Admitting: Licensed Clinical Social Worker

## 2020-12-10 LAB — CULTURE, BLOOD (ROUTINE X 2): Culture: NO GROWTH

## 2020-12-16 ENCOUNTER — Ambulatory Visit: Payer: Self-pay | Admitting: Gerontology

## 2020-12-24 ENCOUNTER — Ambulatory Visit: Payer: Self-pay | Admitting: Gerontology

## 2020-12-24 ENCOUNTER — Other Ambulatory Visit: Payer: Self-pay | Admitting: Internal Medicine

## 2020-12-24 ENCOUNTER — Encounter: Payer: Self-pay | Admitting: Gerontology

## 2020-12-24 ENCOUNTER — Other Ambulatory Visit: Payer: Self-pay

## 2020-12-24 VITALS — BP 155/89 | HR 87 | Temp 98.0°F | Resp 20 | Ht 62.0 in | Wt 266.0 lb

## 2020-12-24 DIAGNOSIS — Z09 Encounter for follow-up examination after completed treatment for conditions other than malignant neoplasm: Secondary | ICD-10-CM

## 2020-12-24 DIAGNOSIS — Z992 Dependence on renal dialysis: Secondary | ICD-10-CM

## 2020-12-24 DIAGNOSIS — H538 Other visual disturbances: Secondary | ICD-10-CM | POA: Insufficient documentation

## 2020-12-24 DIAGNOSIS — I10 Essential (primary) hypertension: Secondary | ICD-10-CM

## 2020-12-24 DIAGNOSIS — E1122 Type 2 diabetes mellitus with diabetic chronic kidney disease: Secondary | ICD-10-CM

## 2020-12-24 MED ORDER — AMLODIPINE BESYLATE 5 MG PO TABS
5.0000 mg | ORAL_TABLET | Freq: Every day | ORAL | 0 refills | Status: DC
  Filled 2020-12-24: qty 30, 30d supply, fill #0

## 2020-12-24 MED ORDER — RIGHTEST GS550 BLOOD GLUCOSE VI STRP
ORAL_STRIP | 0 refills | Status: AC
  Filled 2020-12-24: qty 100, 25d supply, fill #0

## 2020-12-24 MED ORDER — RIGHTEST GM550 BLOOD GLUCOSE W/DEVICE KIT
PACK | 0 refills | Status: DC
  Filled 2020-12-24: qty 1, 30d supply, fill #0

## 2020-12-24 MED ORDER — METOPROLOL SUCCINATE ER 50 MG PO TB24: 50.0000 mg | ORAL_TABLET | Freq: Every day | ORAL | 3 refills | Status: DC

## 2020-12-24 MED ORDER — INSULIN PEN NEEDLE 32G X 4 MM MISC
1.0000 | Freq: Every day | 2 refills | Status: DC
  Filled 2020-12-24: qty 100, 100d supply, fill #0
  Filled 2021-05-06: qty 100, 100d supply, fill #1
  Filled 2021-09-23: qty 100, 100d supply, fill #2

## 2020-12-24 MED ORDER — METOPROLOL SUCCINATE ER 50 MG PO TB24
50.0000 mg | ORAL_TABLET | Freq: Every day | ORAL | 3 refills | Status: DC
  Filled 2020-12-24: qty 30, 30d supply, fill #0
  Filled 2021-02-25: qty 30, 30d supply, fill #1
  Filled 2021-05-06: qty 30, 30d supply, fill #2

## 2020-12-24 MED ORDER — RIGHTEST GL300 LANCETS MISC
0 refills | Status: AC
  Filled 2020-12-24: qty 100, 25d supply, fill #0

## 2020-12-24 MED ORDER — BASAGLAR KWIKPEN 100 UNIT/ML ~~LOC~~ SOPN
14.0000 [IU] | PEN_INJECTOR | Freq: Every day | SUBCUTANEOUS | 3 refills | Status: DC
  Filled 2020-12-24: qty 15, 108d supply, fill #0
  Filled 2021-05-06: qty 15, 108d supply, fill #1
  Filled 2021-09-23: qty 15, 108d supply, fill #2

## 2020-12-24 NOTE — Patient Instructions (Signed)
https://www.nhlbi.nih.gov/files/docs/public/heart/dash_brief.pdf">  DASH Eating Plan DASH stands for Dietary Approaches to Stop Hypertension. The DASH eating plan is a healthy eating plan that has been shown to: Reduce high blood pressure (hypertension). Reduce your risk for type 2 diabetes, heart disease, and stroke. Help with weight loss. What are tips for following this plan? Reading food labels Check food labels for the amount of salt (sodium) per serving. Choose foods with less than 5 percent of the Daily Value of sodium. Generally, foods with less than 300 milligrams (mg) of sodium per serving fit into this eating plan. To find whole grains, look for the word "whole" as the first word in the ingredient list. Shopping Buy products labeled as "low-sodium" or "no salt added." Buy fresh foods. Avoid canned foods and pre-made or frozen meals. Cooking Avoid adding salt when cooking. Use salt-free seasonings or herbs instead of table salt or sea salt. Check with your health care provider or pharmacist before using salt substitutes. Do not fry foods. Cook foods using healthy methods such as baking, boiling, grilling, roasting, and broiling instead. Cook with heart-healthy oils, such as olive, canola, avocado, soybean, or sunflower oil. Meal planning  Eat a balanced diet that includes: 4 or more servings of fruits and 4 or more servings of vegetables each day. Try to fill one-half of your plate with fruits and vegetables. 6-8 servings of whole grains each day. Less than 6 oz (170 g) of lean meat, poultry, or fish each day. A 3-oz (85-g) serving of meat is about the same size as a deck of cards. One egg equals 1 oz (28 g). 2-3 servings of low-fat dairy each day. One serving is 1 cup (237 mL). 1 serving of nuts, seeds, or beans 5 times each week. 2-3 servings of heart-healthy fats. Healthy fats called omega-3 fatty acids are found in foods such as walnuts, flaxseeds, fortified milks, and eggs.  These fats are also found in cold-water fish, such as sardines, salmon, and mackerel. Limit how much you eat of: Canned or prepackaged foods. Food that is high in trans fat, such as some fried foods. Food that is high in saturated fat, such as fatty meat. Desserts and other sweets, sugary drinks, and other foods with added sugar. Full-fat dairy products. Do not salt foods before eating. Do not eat more than 4 egg yolks a week. Try to eat at least 2 vegetarian meals a week. Eat more home-cooked food and less restaurant, buffet, and fast food.  Lifestyle When eating at a restaurant, ask that your food be prepared with less salt or no salt, if possible. If you drink alcohol: Limit how much you use to: 0-1 drink a day for women who are not pregnant. 0-2 drinks a day for men. Be aware of how much alcohol is in your drink. In the [place] one drink equals one 12 oz bottle of beer (355 mL), one 5 oz glass of wine (148 mL), or one 1 oz glass of hard liquor (44 mL). General information Avoid eating more than 2,300 mg of salt a day. If you have hypertension, you may need to reduce your sodium intake to 1,500 mg a day. Work with your health care provider to maintain a healthy body weight or to lose weight. Ask what an ideal weight is for you. Get at least 30 minutes of exercise that causes your heart to beat faster (aerobic exercise) most days of the week. Activities may include walking, swimming, or biking. Work with your health care provider   or dietitian to adjust your eating plan to your individual calorie needs. What foods should I eat? Fruits All fresh, dried, or frozen fruit. Canned fruit in natural juice (without addedsugar). Vegetables Fresh or frozen vegetables (raw, steamed, roasted, or grilled). Low-sodium or reduced-sodium tomato and vegetable juice. Low-sodium or reduced-sodium tomatosauce and tomato paste. Low-sodium or reduced-sodium canned vegetables. Grains Whole-grain or  whole-wheat bread. Whole-grain or whole-wheat pasta. Brown rice. Oatmeal. Quinoa. Bulgur. Whole-grain and low-sodium cereals. Pita bread.Low-fat, low-sodium crackers. Whole-wheat flour tortillas. Meats and other proteins Skinless chicken or turkey. Ground chicken or turkey. Pork with fat trimmed off. Fish and seafood. Egg whites. Dried beans, peas, or lentils. Unsalted nuts, nut butters, and seeds. Unsalted canned beans. Lean cuts of beef with fat trimmed off. Low-sodium, lean precooked or cured meat, such as sausages or meatloaves. Dairy Low-fat (1%) or fat-free (skim) milk. Reduced-fat, low-fat, or fat-free cheeses. Nonfat, low-sodium ricotta or cottage cheese. Low-fat or nonfatyogurt. Low-fat, low-sodium cheese. Fats and oils Soft margarine without trans fats. Vegetable oil. Reduced-fat, low-fat, or light mayonnaise and salad dressings (reduced-sodium). Canola, safflower, olive, avocado, soybean, andsunflower oils. Avocado. Seasonings and condiments Herbs. Spices. Seasoning mixes without salt. Other foods Unsalted popcorn and pretzels. Fat-free sweets. The items listed above may not be a complete list of foods and beverages you can eat. Contact a dietitian for more information. What foods should I avoid? Fruits Canned fruit in a light or heavy syrup. Fried fruit. Fruit in cream or buttersauce. Vegetables Creamed or fried vegetables. Vegetables in a cheese sauce. Regular canned vegetables (not low-sodium or reduced-sodium). Regular canned tomato sauce and paste (not low-sodium or reduced-sodium). Regular tomato and vegetable juice(not low-sodium or reduced-sodium). Pickles. Olives. Grains Baked goods made with fat, such as croissants, muffins, or some breads. Drypasta or rice meal packs. Meats and other proteins Fatty cuts of meat. Ribs. Fried meat. Bacon. Bologna, salami, and other precooked or cured meats, such as sausages or meat loaves. Fat from the back of a pig (fatback). Bratwurst.  Salted nuts and seeds. Canned beans with added salt. Canned orsmoked fish. Whole eggs or egg yolks. Chicken or turkey with skin. Dairy Whole or 2% milk, cream, and half-and-half. Whole or full-fat cream cheese. Whole-fat or sweetened yogurt. Full-fat cheese. Nondairy creamers. Whippedtoppings. Processed cheese and cheese spreads. Fats and oils Butter. Stick margarine. Lard. Shortening. Ghee. Bacon fat. Tropical oils, suchas coconut, palm kernel, or palm oil. Seasonings and condiments Onion salt, garlic salt, seasoned salt, table salt, and sea salt. Worcestershire sauce. Tartar sauce. Barbecue sauce. Teriyaki sauce. Soy sauce, including reduced-sodium. Steak sauce. Canned and packaged gravies. Fish sauce. Oyster sauce. Cocktail sauce. Store-bought horseradish. Ketchup. Mustard. Meat flavorings and tenderizers. Bouillon cubes. Hot sauces. Pre-made or packaged marinades. Pre-made or packaged taco seasonings. Relishes. Regular saladdressings. Other foods Salted popcorn and pretzels. The items listed above may not be a complete list of foods and beverages you should avoid. Contact a dietitian for more information. Where to find more information National Heart, Lung, and Blood Institute: www.nhlbi.nih.gov American Heart Association: www.heart.org Academy of Nutrition and Dietetics: www.eatright.org National Kidney Foundation: www.kidney.org Summary The DASH eating plan is a healthy eating plan that has been shown to reduce high blood pressure (hypertension). It may also reduce your risk for type 2 diabetes, heart disease, and stroke. When on the DASH eating plan, aim to eat more fresh fruits and vegetables, whole grains, lean proteins, low-fat dairy, and heart-healthy fats. With the DASH eating plan, you should limit salt (sodium) intake to 2,300   mg a day. If you have hypertension, you may need to reduce your sodium intake to 1,500 mg a day. Work with your health care provider or dietitian to adjust  your eating plan to your individual calorie needs. This information is not intended to replace advice given to you by your health care provider. Make sure you discuss any questions you have with your healthcare provider. Document Revised: 04/05/2019 Document Reviewed: 04/05/2019 Elsevier Patient Education  2022 [employer]. https://www.diabeteseducator.org/docs/default-source/living-with-diabetes/conquering-the-grocery-store-v1.pdf?sfvrsn=4">  Carbohydrate Counting for Diabetes Mellitus, Adult Carbohydrate counting is a method of keeping track of how many carbohydrates you eat. Eating carbohydrates naturally increases the amount of sugar (glucose) in the blood. Counting how many carbohydrates you eat improves your bloodglucose control, which helps you manage your diabetes. It is important to know how many carbohydrates you can safely have in each meal. This is different for every person. A dietitian can help you make a meal plan and calculate how many carbohydrates you should have at each meal andsnack. What foods contain carbohydrates? Carbohydrates are found in the following foods: Grains, such as breads and cereals. Dried beans and soy products. Starchy vegetables, such as potatoes, peas, and corn. Fruit and fruit juices. Milk and yogurt. Sweets and snack foods, such as cake, cookies, candy, chips, and soft drinks. How do I count carbohydrates in foods? There are two ways to count carbohydrates in food. You can read food labels or learn standard serving sizes of foods. You can use either of the methods or acombination of both. Using the Nutrition Facts label The Nutrition Facts list is included on the labels of almost all packaged foods and beverages in the [place] It includes: The serving size. Information about nutrients in each serving, including the grams (g) of carbohydrate per serving. To use the Nutrition Facts: Decide how many servings you will have. Multiply the number of servings  by the number of carbohydrates per serving. The resulting number is the total amount of carbohydrates that you will be having. Learning the standard serving sizes of foods When you eat carbohydrate foods that are not packaged or do not include Nutrition Facts on the label, you need to measure the servings in order to count the amount of carbohydrates. Measure the foods that you will eat with a food scale or measuring cup, if needed. Decide how many standard-size servings you will eat. Multiply the number of servings by 15. For foods that contain carbohydrates, one serving equals 15 g of carbohydrates. For example, if you eat 2 cups or 10 oz (300 g) of strawberries, you will have eaten 2 servings and 30 g of carbohydrates (2 servings x 15 g = 30 g). For foods that have more than one food mixed, such as soups and casseroles, you must count the carbohydrates in each food that is included. The following list contains standard serving sizes of common carbohydrate-rich foods. Each of these servings has about 15 g of carbohydrates: 1 slice of bread. 1 six-inch (15 cm) tortilla. ? cup or 2 oz (53 g) cooked rice or pasta.  cup or 3 oz (85 g) cooked or canned, drained and rinsed beans or lentils.  cup or 3 oz (85 g) starchy vegetable, such as peas, corn, or squash.  cup or 4 oz (120 g) hot cereal.  cup or 3 oz (85 g) boiled or mashed potatoes, or  or 3 oz (85 g) of a large baked potato.  cup or 4 fl oz (118 mL) fruit juice. 1 cup or  8 fl oz (237 mL) milk. 1 small or 4 oz (106 g) apple.  or 2 oz (63 g) of a medium banana. 1 cup or 5 oz (150 g) strawberries. 3 cups or 1 oz (24 g) popped popcorn. What is an example of carbohydrate counting? To calculate the number of carbohydrates in this sample meal, follow the stepsshown below. Sample meal 3 oz (85 g) chicken breast. ? cup or 4 oz (106 g) brown rice.  cup or 3 oz (85 g) corn. 1 cup or 8 fl oz (237 mL) milk. 1 cup or 5 oz (150 g)  strawberries with sugar-free whipped topping. Carbohydrate calculation Identify the foods that contain carbohydrates: Rice. Corn. Milk. Strawberries. Calculate how many servings you have of each food: 2 servings rice. 1 serving corn. 1 serving milk. 1 serving strawberries. Multiply each number of servings by 15 g: 2 servings rice x 15 g = 30 g. 1 serving corn x 15 g = 15 g. 1 serving milk x 15 g = 15 g. 1 serving strawberries x 15 g = 15 g. Add together all of the amounts to find the total grams of carbohydrates eaten: 30 g + 15 g + 15 g + 15 g = 75 g of carbohydrates total. What are tips for following this plan? Shopping Develop a meal plan and then make a shopping list. Buy fresh and frozen vegetables, fresh and frozen fruit, dairy, eggs, beans, lentils, and whole grains. Look at food labels. Choose foods that have more fiber and less sugar. Avoid processed foods and foods with added sugars. Meal planning Aim to have the same amount of carbohydrates at each meal and for each snack time. Plan to have regular, balanced meals and snacks. Where to find more information American Diabetes Association: www.diabetes.org Centers for Disease Control and Prevention: http://www.wolf.info/ Summary Carbohydrate counting is a method of keeping track of how many carbohydrates you eat. Eating carbohydrates naturally increases the amount of sugar (glucose) in the blood. Counting how many carbohydrates you eat improves your blood glucose control, which helps you manage your diabetes. A dietitian can help you make a meal plan and calculate how many carbohydrates you should have at each meal and snack. This information is not intended to replace advice given to you by your health care provider. Make sure you discuss any questions you have with your healthcare provider. Document Revised: 05/02/2019 Document Reviewed: 05/03/2019 Elsevier Patient Education  2021 [employer].

## 2020-12-24 NOTE — Progress Notes (Signed)
Established Patient Office Visit  Subjective:  Patient ID: [name], female    DOB: 1976-01-24  Age: 45 y.o. MRN: [id number]  CC:  Chief Complaint  Patient presents with   Follow-up    Pt discharged from hospital 7/25 with Covid (past quarantine date) and renal disease. She reports slight blurry vision ("like a spider web") in her right eye.    Diabetes    Pt check sugars at home    HPI [name] is a 45 y/o female who history of Asthma,T2 Diabetes, Hypertension, presents for hospital discharge follow up. She was discharged from the hospital on 12/07/20, and during her hospital course, she was treated for SARS Covid 19 infection and UTI. Her Chest CT scan during hospitalization  on 12/04/20 showed Focal region of consolidation versus masslike opacity present in the superior segment right lower lobe. Burtis Junes an acute infectious or inflammatory process though follow-up imaging should be considered in 6-8 weeks following therapeutic intervention to ensure resolution.  Additional features of interstitial pulmonary edema throughout the lungs. With additional features of chronic overload including mild cardiomegaly and pericardial effusion as well as diffuse body wall edema. Asymmetric left perinephric stranding, nonspecific in the setting of underlying renal dysfunction/dialysis. Could correlate with urinalysis if patient is able to produce urine in order to exclude an ascending tract infection. and was scheduled for a repeat on 6-8 weeks .She has right chest permcath for hemodialysis on M,W,F . She has AV fistula to her left arm and it has positive thrill and bruit. Her HgbA1c that was done on 12/05/20 was 10.8%, and she checks her blood glucose once in a while.Her blood glucose checked during visit was 171 mg/dl. Currently, she reports experiencing slight blurry vision to her right eye that started prior to her hospital discharge. She reports seeing dark spots. Her blood pressure was elevated  during visit, and she has not picked up her medication from the pharmacy.  Overall, she states that she is doing well and offers no further complaint.  Past Medical History:  Diagnosis Date   Anemia    Asthma    well controlled   Complication of anesthesia    "takes alot for anesthesia to work"   Concussion    h/o   Depression    Diabetes ([place])    last A1c was 5.8 on 09-04-20   Dialysis patient ([place])    T, Th, and Sat   Family history of adverse reaction to anesthesia    son-became aggressive after surgery   GERD (gastroesophageal reflux disease)    occ no meds   Headache    migraines   Hypertension    Palpitations    Pneumonia    h/o   Renal disorder     Past Surgical History:  Procedure Laterality Date   AV FISTULA PLACEMENT Left 11/20/2020   Procedure: ARTERIOVENOUS (AV) FISTULA CREATION ( BRACHIAL CEPHALIC );  Surgeon: Katha Cabal, MD;  Location: [facility];  Service: Vascular;  Laterality: Left;   CESAREAN SECTION N/A    x3   CHOLECYSTECTOMY     DIALYSIS/PERMA CATHETER INSERTION N/A 08/24/2020   Procedure: DIALYSIS/PERMA CATHETER INSERTION;  Surgeon: Algernon Huxley, MD;  Location: [facility];  Service: Cardiovascular;  Laterality: N/A;    Family History  Problem Relation Age of Onset   Diabetes Mother    Skin cancer Mother    Heart attack Father        died at ~36 years of age  Alcohol abuse Brother    Diabetes Brother     Social History   Socioeconomic History   Marital status: Married    Spouse name: Not on file   Number of children: Not on file   Years of education: Not on file   Highest education level: Not on file  Occupational History   Not on file  Tobacco Use   Smoking status: Never   Smokeless tobacco: Never  Vaping Use   Vaping Use: Never used  Substance and Sexual Activity   Alcohol use: Never   Drug use: Never   Sexual activity: Not on file  Other Topics Concern   Not on file  Social History Narrative   Not on file    Social Determinants of Health   Financial Resource Strain: Not on file  Food Insecurity: Food Insecurity Present   Worried About [place] in the Last Year: Often true   Arboriculturist in the Last Year: Often true  Transportation Needs: Public librarian (Medical): Yes   Lack of Transportation (Non-Medical): Yes  Physical Activity: Not on file  Stress: Not on file  Social Connections: Not on file  Intimate Partner Violence: Not on file    Outpatient Medications Prior to Visit  Medication Sig Dispense Refill   acetaminophen (TYLENOL) 500 MG tablet Take 500-1,000 mg by mouth every 6 (six) hours as needed (pain).     Blood Pressure KIT USE AS DIRECTED. 1 kit 0   CALCITRIOL IV Inject 2 mcg into the vein 3 (three) times a week.     calcium carbonate (TUMS - DOSED IN MG ELEMENTAL CALCIUM) 500 MG chewable tablet Chew 1 tablet (200 mg of elemental calcium total) by mouth 3 (three) times daily with meals. 90 tablet 1   diphenhydrAMINE (BENADRYL) 25 MG tablet Take 25 mg by mouth every 6 (six) hours as needed. Taking as needed     epoetin alfa (EPOGEN) 10000 UNIT/ML injection Inject 1,400 Units into the vein 3 (three) times a week.     Iron Sucrose (VENOFER IV) Inject 100 mg into the vein 3 (three) times a week.     HYDROcodone-acetaminophen (NORCO) 5-325 MG tablet Take 1 tablet by mouth every 6 (six) hours as needed for moderate pain. (Patient not taking: Reported on 12/24/2020) 30 tablet 0   metoprolol succinate (TOPROL-XL) 50 MG 24 hr tablet Take 1 tablet (50 mg total) by mouth daily. Take with or immediately following a meal. (Patient not taking: Reported on 12/24/2020) 30 tablet 3   polyethylene glycol (MIRALAX / GLYCOLAX) 17 g packet Mix 17 g with 8oz of water and take by mouth 2 (two) times daily. (Patient not taking: Reported on 12/24/2020) 60 each 0   No facility-administered medications prior to visit.    Allergies  Allergen Reactions    Statins     Other reaction(s): Other (See Comments) "redness and facial swelling"   Cholesterol Rash    Cholesterol medicine    ROS Review of Systems  Constitutional: Negative.   Eyes:  Positive for visual disturbance.  Respiratory: Negative.    Cardiovascular: Negative.   Skin: Negative.   Neurological: Negative.      Objective:    Physical Exam HENT:     Head: Normocephalic and atraumatic.     Mouth/Throat:     Mouth: Mucous membranes are moist.  Eyes:     Extraocular Movements: Extraocular movements intact.     Conjunctiva/sclera:  Conjunctivae normal.     Pupils: Pupils are equal, round, and reactive to light.  Cardiovascular:     Rate and Rhythm: Normal rate and regular rhythm.     Pulses: Normal pulses.     Heart sounds: Normal heart sounds.  Pulmonary:     Effort: Pulmonary effort is normal.     Breath sounds: Normal breath sounds.  Skin:    General: Skin is warm.  Neurological:     General: No focal deficit present.     Mental Status: She is alert and oriented to person, place, and time. Mental status is at baseline.  Psychiatric:        Mood and Affect: Mood normal.        Behavior: Behavior normal.        Thought Content: Thought content normal.        Judgment: Judgment normal.    BP (!) 155/89 (BP Location: Right Arm, Patient Position: Sitting, Cuff Size: Small)   Pulse 87   Temp 98 F (36.7 C)   Resp 20   Ht '5\' 2"'  (1.575 m)   Wt 266 lb (120.7 kg)   SpO2 96%   BMI 48.65 kg/m  Wt Readings from Last 3 Encounters:  12/24/20 266 lb (120.7 kg)  12/04/20 262 lb 5.6 oz (119 kg)  11/20/20 261 lb (118.4 kg)   Encouraged weight loss.  Health Maintenance Due  Topic Date Due   COVID-19 Vaccine (1) Never done   Pneumococcal Vaccine 70-1 Years old (1 - PCV) Never done   FOOT EXAM  Never done   OPHTHALMOLOGY EXAM  Never done   URINE MICROALBUMIN  Never done   Hepatitis C Screening  Never done   PAP SMEAR-Modifier  Never done   COLONOSCOPY (Pts  45-80yr Insurance coverage will need to be confirmed)  Never done   INFLUENZA VACCINE  12/14/2020    There are no preventive care reminders to display for this patient.  Lab Results  Component Value Date   TSH 3.921 08/29/2020   Lab Results  Component Value Date   WBC 5.2 12/07/2020   HGB 9.7 (L) 12/07/2020   HCT 28.0 (L) 12/07/2020   MCV 88.3 12/07/2020   PLT 228 12/07/2020   Lab Results  Component Value Date   NA 132 (L) 12/07/2020   K 4.6 12/07/2020   CO2 17 (L) 12/07/2020   GLUCOSE 194 (H) 12/07/2020   BUN 55 (H) 12/07/2020   CREATININE 10.52 (H) 12/07/2020   BILITOT 0.8 12/05/2020   ALKPHOS 94 12/05/2020   AST 17 12/05/2020   ALT 9 12/05/2020   PROT 5.9 (L) 12/05/2020   ALBUMIN 2.5 (L) 12/07/2020   CALCIUM 7.0 (L) 12/07/2020   ANIONGAP 14 12/07/2020   No results found for: CHOL No results found for: HDL No results found for: LDLCALC No results found for: TRIG No results found for: CHOLHDL Lab Results  Component Value Date   HGBA1C 10.8 (H) 12/05/2020      Assessment & Plan:   1. Essential hypertension -Her blood pressure was elevated during visit, she was encouraged to pick up her medicine from the pharmacy, continue on DASH diet and exercise as tolerated.  Her goal blood pressure should be less than 130/80. - amLODipine (NORVASC) 5 MG tablet; Take 1 tablet (5 mg total) by mouth once daily.  Dispense: 60 tablet; Refill: 0 - metoprolol succinate (TOPROL-XL) 50 MG 24 hr tablet; Take 1 tablet (50 mg total) by mouth once daily. Take  with or immediately following a meal.  Dispense: 30 tablet; Refill: 3  2. Type 2 diabetes mellitus with chronic kidney disease on chronic dialysis, without long-term current use of insulin (HCC) -Her hemoglobin A1c was 10.8%, her goal should be less than 7%.  She was started on 40 units of glargine at bedtime, advised to check her blood glucose 3 times a day before meals, record and bring log to follow-up visit.  She was advised to  continue on low-carb/non concentrated sweet diet and exercise as tolerated. - POCT Glucose (CBG); Future - Blood Glucose Monitoring Suppl (RIGHTEST GM550 BLOOD GLUCOSE) w/Device KIT; USE AS DIRECTED UP TO FOUR TIMES DAILY.  Dispense: 1 kit; Refill: 0 - Insulin Glargine (BASAGLAR KWIKPEN) 100 UNIT/ML; Inject 14 Units into the skin once daily at bedtime.  Dispense: 15 mL; Refill: 3 - Insulin Pen Needle 32G X 4 MM MISC; USE AS DIRECTED ONCE DAILY AT BEDTIME.  Dispense: 100 each; Refill: 2  3. Blurry vision, right eye -She was advised to complete [facility] financial application for- Ambulatory referral to Ophthalmology.  She was advised to go to the emergency room for worsening symptoms.  4. Hospital discharge follow-up -She will follow-up for repeat chest CT scan in September. - CT Chest Wo Contrast; Future      Follow-up: Return in about 6 weeks (around 02/04/2021), or if symptoms worsen or fail to improve.    Edinson Domeier Jerold Coombe, NP

## 2020-12-31 ENCOUNTER — Ambulatory Visit: Payer: Self-pay | Admitting: Licensed Clinical Social Worker

## 2020-12-31 ENCOUNTER — Other Ambulatory Visit: Payer: Self-pay

## 2020-12-31 ENCOUNTER — Telehealth: Payer: Self-pay | Admitting: Licensed Clinical Social Worker

## 2020-12-31 ENCOUNTER — Ambulatory Visit (INDEPENDENT_AMBULATORY_CARE_PROVIDER_SITE_OTHER): Payer: Self-pay | Admitting: Vascular Surgery

## 2020-12-31 ENCOUNTER — Encounter (INDEPENDENT_AMBULATORY_CARE_PROVIDER_SITE_OTHER): Payer: Self-pay | Admitting: Vascular Surgery

## 2020-12-31 VITALS — BP 170/100 | HR 81 | Resp 15 | Wt 265.2 lb

## 2020-12-31 DIAGNOSIS — Z992 Dependence on renal dialysis: Secondary | ICD-10-CM

## 2020-12-31 DIAGNOSIS — N186 End stage renal disease: Secondary | ICD-10-CM

## 2020-12-31 NOTE — Progress Notes (Signed)
Patient ID: [name], female   DOB: 04/22/1976, 45 y.o.   MRN: [id number]  Chief Complaint  Patient presents with   Follow-up    [facility] 2 wk post av fistula creation    HPI [name] is a 45 y.o. female.    Procedure 11/20/2020: left brachial cephalic arteriovenous fistula placement  She denies pain  no drainage, no hand symptoms   Past Medical History:  Diagnosis Date   Anemia    Asthma    well controlled   Complication of anesthesia    "takes alot for anesthesia to work"   Concussion    h/o   Depression    Diabetes ([place])    last A1c was 5.8 on 09-04-20   Dialysis patient ([place])    T, Th, and Sat   Family history of adverse reaction to anesthesia    son-became aggressive after surgery   GERD (gastroesophageal reflux disease)    occ no meds   Headache    migraines   Hypertension    Palpitations    Pneumonia    h/o   Renal disorder     Past Surgical History:  Procedure Laterality Date   AV FISTULA PLACEMENT Left 11/20/2020   Procedure: ARTERIOVENOUS (AV) FISTULA CREATION ( BRACHIAL CEPHALIC );  Surgeon: Katha Cabal, MD;  Location: [facility] ORS;  Service: Vascular;  Laterality: Left;   CESAREAN SECTION N/A    x3   CHOLECYSTECTOMY     DIALYSIS/PERMA CATHETER INSERTION N/A 08/24/2020   Procedure: DIALYSIS/PERMA CATHETER INSERTION;  Surgeon: Algernon Huxley, MD;  Location: [facility];  Service: Cardiovascular;  Laterality: N/A;      Allergies  Allergen Reactions   Statins     Other reaction(s): Other (See Comments) "redness and facial swelling"   Cholesterol Rash    Cholesterol medicine    Current Outpatient Medications  Medication Sig Dispense Refill   acetaminophen (TYLENOL) 500 MG tablet Take 500-1,000 mg by mouth every 6 (six) hours as needed (pain).     amLODipine (NORVASC) 5 MG tablet Take 1 tablet (5 mg total) by mouth once daily. 60 tablet 0   Blood Glucose Monitoring Suppl (RIGHTEST GM550 BLOOD GLUCOSE) w/Device KIT USE AS  DIRECTED UP TO FOUR TIMES DAILY. 1 kit 0   Blood Pressure KIT USE AS DIRECTED. 1 kit 0   CALCITRIOL IV Inject 2 mcg into the vein 3 (three) times a week.     calcium carbonate (TUMS - DOSED IN MG ELEMENTAL CALCIUM) 500 MG chewable tablet Chew 1 tablet (200 mg of elemental calcium total) by mouth 3 (three) times daily with meals. 90 tablet 1   diphenhydrAMINE (BENADRYL) 25 MG tablet Take 25 mg by mouth every 6 (six) hours as needed. Taking as needed     epoetin alfa (EPOGEN) 10000 UNIT/ML injection Inject 1,400 Units into the vein 3 (three) times a week.     glucose blood (RIGHTEST GS550 BLOOD GLUCOSE) test strip USE AS DIRECTED UP TO FOUR TIMES DAILY. 100 strip 0   Insulin Glargine (BASAGLAR KWIKPEN) 100 UNIT/ML Inject 14 Units into the skin once daily at bedtime. 15 mL 3   Insulin Pen Needle 32G X 4 MM MISC USE AS DIRECTED ONCE DAILY AT BEDTIME. 100 each 2   Iron Sucrose (VENOFER IV) Inject 100 mg into the vein 3 (three) times a week.     metoprolol succinate (TOPROL-XL) 50 MG 24 hr tablet Take 1 tablet (50 mg total) by mouth once  daily. Take with or immediately following a meal. 30 tablet 3   Rightest GL300 Lancets MISC USE AS DIRECTED UP TO FOUR TIMES DAILY. 100 each 0   No current facility-administered medications for this visit.        Physical Exam BP (!) 170/100 (BP Location: Right Arm)   Pulse 81   Resp 15   Wt 265 lb 3.2 oz (120.3 kg)   BMI 48.51 kg/m  Gen:  WD/WN, NAD Skin: incision C/D/I; good thrill good bruit     Assessment/Plan: 1. End stage renal disease on dialysis [facility]) Recommend:  The patient is doing well and currently has adequate dialysis access. The patient's dialysis center is not reporting any access issues.  The patient will follow-up with me in the office in three weeks with an HDA at which time I am hopeful we can approve cannulation   - VAS [place] (AVF, AVG); Future      [name] 12/31/2020, 11:19 AM   This note  was created with Dragon medical transcription system.  Any errors from dictation are unintentional.

## 2020-12-31 NOTE — Telephone Encounter (Signed)
Called patient and rescheduled her appointment with the [facility]. Also discussed resources with her and will mail her those resources today, as well as medical release forms to receive records from her past providers.

## 2020-12-31 NOTE — Telephone Encounter (Signed)
Called the patient during today's scheduled appointment; no answer, left a voicemail with the clinic contact information so they may reschedule.   

## 2021-01-03 ENCOUNTER — Inpatient Hospital Stay
Admission: EM | Admit: 2021-01-03 | Discharge: 2021-01-05 | DRG: 640 | Disposition: A | Discharge status: Home / Self Care | Payer: Medicaid Other | Attending: Hospitalist | Admitting: Hospitalist

## 2021-01-03 ENCOUNTER — Other Ambulatory Visit: Payer: Self-pay

## 2021-01-03 ENCOUNTER — Emergency Department: Payer: Medicaid Other

## 2021-01-03 ENCOUNTER — Encounter: Payer: Self-pay | Admitting: Internal Medicine

## 2021-01-03 ENCOUNTER — Encounter (INDEPENDENT_AMBULATORY_CARE_PROVIDER_SITE_OTHER): Payer: Self-pay | Admitting: Vascular Surgery

## 2021-01-03 DIAGNOSIS — Z794 Long term (current) use of insulin: Secondary | ICD-10-CM | POA: Diagnosis not present

## 2021-01-03 DIAGNOSIS — N2581 Secondary hyperparathyroidism of renal origin: Secondary | ICD-10-CM | POA: Diagnosis present

## 2021-01-03 DIAGNOSIS — E1122 Type 2 diabetes mellitus with diabetic chronic kidney disease: Secondary | ICD-10-CM | POA: Diagnosis present

## 2021-01-03 DIAGNOSIS — Z79899 Other long term (current) drug therapy: Secondary | ICD-10-CM | POA: Diagnosis not present

## 2021-01-03 DIAGNOSIS — R600 Localized edema: Secondary | ICD-10-CM

## 2021-01-03 DIAGNOSIS — Z8616 Personal history of COVID-19: Secondary | ICD-10-CM | POA: Diagnosis not present

## 2021-01-03 DIAGNOSIS — J9691 Respiratory failure, unspecified with hypoxia: Secondary | ICD-10-CM | POA: Diagnosis present

## 2021-01-03 DIAGNOSIS — E8770 Fluid overload, unspecified: Secondary | ICD-10-CM | POA: Diagnosis present

## 2021-01-03 DIAGNOSIS — I160 Hypertensive urgency: Secondary | ICD-10-CM | POA: Diagnosis present

## 2021-01-03 DIAGNOSIS — I120 Hypertensive chronic kidney disease with stage 5 chronic kidney disease or end stage renal disease: Secondary | ICD-10-CM | POA: Diagnosis present

## 2021-01-03 DIAGNOSIS — J45909 Unspecified asthma, uncomplicated: Secondary | ICD-10-CM | POA: Diagnosis present

## 2021-01-03 DIAGNOSIS — Z992 Dependence on renal dialysis: Secondary | ICD-10-CM

## 2021-01-03 DIAGNOSIS — Z6841 Body Mass Index (BMI) 40.0 and over, adult: Secondary | ICD-10-CM | POA: Diagnosis not present

## 2021-01-03 DIAGNOSIS — Z8249 Family history of ischemic heart disease and other diseases of the circulatory system: Secondary | ICD-10-CM | POA: Diagnosis not present

## 2021-01-03 DIAGNOSIS — R0602 Shortness of breath: Secondary | ICD-10-CM | POA: Diagnosis present

## 2021-01-03 DIAGNOSIS — Z833 Family history of diabetes mellitus: Secondary | ICD-10-CM

## 2021-01-03 DIAGNOSIS — J9601 Acute respiratory failure with hypoxia: Secondary | ICD-10-CM | POA: Diagnosis present

## 2021-01-03 DIAGNOSIS — E669 Obesity, unspecified: Secondary | ICD-10-CM | POA: Diagnosis present

## 2021-01-03 DIAGNOSIS — N186 End stage renal disease: Secondary | ICD-10-CM | POA: Diagnosis present

## 2021-01-03 DIAGNOSIS — Z888 Allergy status to other drugs, medicaments and biological substances status: Secondary | ICD-10-CM | POA: Diagnosis not present

## 2021-01-03 DIAGNOSIS — D631 Anemia in chronic kidney disease: Secondary | ICD-10-CM | POA: Diagnosis present

## 2021-01-03 DIAGNOSIS — Z20822 Contact with and (suspected) exposure to covid-19: Secondary | ICD-10-CM | POA: Diagnosis present

## 2021-01-03 DIAGNOSIS — D638 Anemia in other chronic diseases classified elsewhere: Secondary | ICD-10-CM | POA: Diagnosis present

## 2021-01-03 DIAGNOSIS — K219 Gastro-esophageal reflux disease without esophagitis: Secondary | ICD-10-CM | POA: Diagnosis present

## 2021-01-03 DIAGNOSIS — J810 Acute pulmonary edema: Secondary | ICD-10-CM | POA: Diagnosis present

## 2021-01-03 DIAGNOSIS — I10 Essential (primary) hypertension: Secondary | ICD-10-CM | POA: Diagnosis present

## 2021-01-03 LAB — COMPREHENSIVE METABOLIC PANEL
ALT: 11 U/L (ref 0–44)
AST: 16 U/L (ref 15–41)
Albumin: 3.4 g/dL — ABNORMAL LOW (ref 3.5–5.0)
Alkaline Phosphatase: 101 U/L (ref 38–126)
Anion gap: 8 (ref 5–15)
BUN: 39 mg/dL — ABNORMAL HIGH (ref 6–20)
CO2: 24 mmol/L (ref 22–32)
Calcium: 8.6 mg/dL — ABNORMAL LOW (ref 8.9–10.3)
Chloride: 103 mmol/L (ref 98–111)
Creatinine, Ser: 7.73 mg/dL — ABNORMAL HIGH (ref 0.44–1.00)
GFR, Estimated: 6 mL/min — ABNORMAL LOW (ref >60)
Glucose, Bld: 212 mg/dL — ABNORMAL HIGH (ref 70–99)
Potassium: 5.1 mmol/L (ref 3.5–5.1)
Sodium: 135 mmol/L (ref 135–145)
Total Bilirubin: 0.5 mg/dL (ref 0.3–1.2)
Total Protein: 6.7 g/dL (ref 6.5–8.1)

## 2021-01-03 LAB — CBC
HCT: 30.8 % — ABNORMAL LOW (ref 36.0–46.0)
Hemoglobin: 10.1 g/dL — ABNORMAL LOW (ref 12.0–15.0)
MCH: 29.8 pg (ref 26.0–34.0)
MCHC: 32.8 g/dL (ref 30.0–36.0)
MCV: 90.9 fL (ref 80.0–100.0)
Platelets: 284 10*3/uL (ref 150–400)
RBC: 3.39 MIL/uL — ABNORMAL LOW (ref 3.87–5.11)
RDW: 13.2 % (ref 11.5–15.5)
WBC: 8.9 10*3/uL (ref 4.0–10.5)
nRBC: 0 % (ref 0.0–0.2)

## 2021-01-03 LAB — TROPONIN I (HIGH SENSITIVITY)
Troponin I (High Sensitivity): 7 ng/L (ref <18)
Troponin I (High Sensitivity): 7 ng/L (ref <18)

## 2021-01-03 LAB — BRAIN NATRIURETIC PEPTIDE: B Natriuretic Peptide: 546.1 pg/mL — ABNORMAL HIGH (ref 0.0–100.0)

## 2021-01-03 MED ORDER — LORAZEPAM 0.5 MG PO TABS
0.5000 mg | ORAL_TABLET | Freq: Two times a day (BID) | ORAL | Status: DC | PRN
  Administered 2021-01-04 – 2021-01-05 (×2): 0.5 mg via ORAL
  Filled 2021-01-03 (×2): qty 1

## 2021-01-03 MED ORDER — INSULIN ASPART 100 UNIT/ML IJ SOLN
0.0000 [IU] | Freq: Three times a day (TID) | INTRAMUSCULAR | Status: DC
  Administered 2021-01-04: 2 [IU] via SUBCUTANEOUS
  Administered 2021-01-05 (×2): 1 [IU] via SUBCUTANEOUS
  Filled 2021-01-03 (×3): qty 1

## 2021-01-03 MED ORDER — CHLORHEXIDINE GLUCONATE CLOTH 2 % EX PADS
6.0000 | MEDICATED_PAD | Freq: Every day | CUTANEOUS | Status: DC
  Administered 2021-01-05: 6 via TOPICAL
  Filled 2021-01-03 (×2): qty 6

## 2021-01-03 MED ORDER — LORAZEPAM 2 MG/ML IJ SOLN
0.5000 mg | Freq: Two times a day (BID) | INTRAMUSCULAR | Status: DC | PRN
Start: 2021-01-03 — End: 2021-01-03

## 2021-01-03 NOTE — ED Triage Notes (Signed)
Pt arrives in resp distress. Pt states she has been increasingly shob for a week. Pt received dialysis MWF. Pt placed on oxygen at 4lpm [place] in triage for pox of 86% with rebound pox in 3 minutes to 94%.

## 2021-01-03 NOTE — Progress Notes (Signed)
[facility]  ROUNDING NOTE   Subjective:   Ms. Pierson Hunnicutt was admitted to [facility] on 01/03/2021 for SOB  Last hemodialysis treatment was 8/19. She reached post treatment weight of 119.8kg.   However patient states her shortness of breath has been progressive for the last week. Patient feels that she may have lost some weight.   Patient had a pulse ox of 86% on presentation. Now on 4 liters O2. She does not use oxygen at home.   Objective:  Vital signs in last 24 hours:  Pulse Rate:  [72-81] 72 (08/21 2043) Resp:  [18-28] 18 (08/21 2055) BP: (167-204)/(111-116) 167/116 (08/21 2055) SpO2:  [86 %-99 %] 99 % (08/21 2043)  Weight change:  There were no vitals filed for this visit.  Intake/Output: No intake/output data recorded.   Intake/Output this shift:  No intake/output data recorded.  Physical Exam: General: Sitting up in stretcher,   Head: Normocephalic, atraumatic. Moist oral mucosal membranes  Eyes: Anicteric, PERRL  Neck: Supple, trachea midline  Lungs:  Bilateral crackles at bases, 4L Loop O2  Heart: Regular rate and rhythm  Abdomen:  Soft, nontender, obese  Extremities:  No peripheral edema.  Neurologic: Nonfocal, moving all four extremities  Skin: No lesions  Access: RIJ permcath, left AVF maturing    Basic Metabolic Panel: Recent Labs  Lab 01/03/21 2011  NA 135  K 5.1  CL 103  CO2 24  GLUCOSE 212*  BUN 39*  CREATININE 7.73*  CALCIUM 8.6*    Liver Function Tests: Recent Labs  Lab 01/03/21 2011  AST 16  ALT 11  ALKPHOS 101  BILITOT 0.5  PROT 6.7  ALBUMIN 3.4*   No results for input(s): LIPASE, AMYLASE in the last 168 hours. No results for input(s): AMMONIA in the last 168 hours.  CBC: Recent Labs  Lab 01/03/21 2011  WBC 8.9  HGB 10.1*  HCT 30.8*  MCV 90.9  PLT 284    Cardiac Enzymes: No results for input(s): CKTOTAL, CKMB, CKMBINDEX, TROPONINI in the last 168 hours.  BNP: Invalid input(s): POCBNP  CBG: No  results for input(s): GLUCAP in the last 168 hours.  Microbiology: Results for orders placed or performed during the hospital encounter of 12/04/20  Resp Panel by RT-PCR (Flu A&B, Covid) Nasopharyngeal Swab     Status: Abnormal   Collection Time: 12/04/20 10:53 PM   Specimen: Nasopharyngeal Swab; Nasopharyngeal(NP) swabs in vial transport medium  Result Value Ref Range Status   SARS Coronavirus 2 by RT PCR POSITIVE (A) NEGATIVE Final    Comment: RESULT CALLED TO, READ BACK BY AND VERIFIED WITH: MELANIE FOWLER @ [id number] 12/05/20 [facility] (NOTE) SARS-CoV-2 target nucleic acids are DETECTED.  The SARS-CoV-2 RNA is generally detectable in upper respiratory specimens during the acute phase of infection. Positive results are indicative of the presence of the identified virus, but do not rule out bacterial infection or co-infection with other pathogens not detected by the test. Clinical correlation with patient history and other diagnostic information is necessary to determine patient infection status. The expected result is Negative.  Fact Sheet for Patients: EntrepreneurPulse.com.au  Fact Sheet for Healthcare Providers: IncredibleEmployment.be  This test is not yet approved or cleared by the [place] FDA and  has been authorized for detection and/or diagnosis of SARS-CoV-2 by FDA under an Emergency Use Authorization (EUA).  This EUA will remain in effect (meaning this test can b e used) for the duration of  the COVID-19 declaration under Section 564(b)(1) of the  Act, 21 U.S.C. section 360bbb-3(b)(1), unless the authorization is terminated or revoked sooner.     Influenza A by PCR NEGATIVE NEGATIVE Final   Influenza B by PCR NEGATIVE NEGATIVE Final    Comment: (NOTE) The Xpert Xpress SARS-CoV-2/FLU/RSV plus assay is intended as an aid in the diagnosis of influenza from Nasopharyngeal swab specimens and should not be used as a sole basis for  treatment. Nasal washings and aspirates are unacceptable for Xpert Xpress SARS-CoV-2/FLU/RSV testing.  Fact Sheet for Patients: EntrepreneurPulse.com.au  Fact Sheet for Healthcare Providers: IncredibleEmployment.be  This test is not yet approved or cleared by the [place] FDA and has been authorized for detection and/or diagnosis of SARS-CoV-2 by FDA under an Emergency Use Authorization (EUA). This EUA will remain in effect (meaning this test can be used) for the duration of the COVID-19 declaration under Section 564(b)(1) of the Act, 21 U.S.C. section 360bbb-3(b)(1), unless the authorization is terminated or revoked.  Performed at [facility], [address] [place], [place] [address]   Culture, blood (Routine X 2) w Reflex to ID Panel     Status: None   Collection Time: 12/04/20 10:53 PM   Specimen: BLOOD  Result Value Ref Range Status   Specimen Description BLOOD RIGHT ANTECUBITAL  Final   Special Requests   Final    BOTTLES DRAWN AEROBIC AND ANAEROBIC Blood Culture adequate volume   Culture   Final    NO GROWTH 5 DAYS Performed at [facility], [address] [place], [place] [address]    Report Status 12/09/2020 FINAL  Final  Culture, blood (Routine X 2) w Reflex to ID Panel     Status: None   Collection Time: 12/05/20 12:02 AM   Specimen: BLOOD  Result Value Ref Range Status   Specimen Description BLOOD RIGHT HAND  Final   Special Requests   Final    BOTTLES DRAWN AEROBIC AND ANAEROBIC Blood Culture results may not be optimal due to an inadequate volume of blood received in culture bottles   Culture   Final    NO GROWTH 5 DAYS Performed at [facility], [address], [place], [place] [address]    Report Status 12/10/2020 FINAL  Final  Urine Culture     Status: Abnormal   Collection Time: 12/05/20 10:30 AM   Specimen: Urine, Clean Catch  Result Value Ref Range Status   Specimen Description    Final    URINE, CLEAN CATCH Performed at [facility], [address], [place], [place] [address]    Special Requests   Final    Normal Performed at [facility], [place], [place] [address]    Culture 60,000 COLONIES/mL ENTEROCOCCUS FAECALIS (A)  Final   Report Status 12/07/2020 FINAL  Final   Organism ID, Bacteria ENTEROCOCCUS FAECALIS (A)  Final      Susceptibility   Enterococcus faecalis - MIC*    AMPICILLIN <=2 SENSITIVE Sensitive     NITROFURANTOIN <=16 SENSITIVE Sensitive     VANCOMYCIN 1 SENSITIVE Sensitive     * 60,000 COLONIES/mL ENTEROCOCCUS FAECALIS    Coagulation Studies: No results for input(s): LABPROT, INR in the last 72 hours.  Urinalysis: No results for input(s): COLORURINE, LABSPEC, PHURINE, GLUCOSEU, HGBUR, BILIRUBINUR, KETONESUR, PROTEINUR, UROBILINOGEN, NITRITE, LEUKOCYTESUR in the last 72 hours.  Invalid input(s): APPERANCEUR    Imaging: DG Chest Portable 1 View  Result Date: 01/03/2021 CLINICAL DATA:  Shortness of breath. EXAM: PORTABLE CHEST 1 VIEW COMPARISON:  12/04/2020 FINDINGS: Patient has RIGHT-sided dialysis catheter, tips overlying the superior vena cava. The heart is enlarged. There are pulmonary opacities bilaterally, consistent with edema. No consolidations. No pleural effusions. IMPRESSION: Cardiomegaly and pulmonary edema. Electronically Signed   By: Nolon Nations M.D.   On: 01/03/2021 20:33     Medications:       Assessment/ Plan:  Ms. Baley Altheide is a 45 y.o. Hispanic female with end stage renal disease on hemodialysis, diabetes mellitus type II, asthma, hypertension, depression, GERD, who is admitted to [facility] on 01/03/2021 for SOB  [facility] TTS [place] 120kg  End Stage Renal Disease: patient with pulmonary edema requiring oxygen and hypertensive urgency. Will schedule emergent hemodialysis treatment for tonight. Orders prepared. UF goal of 2.5 liters.    Hypertension: with urgency. 204/111. Volume driven. Not currently on a diuretic. Home regimen of metoprolol and amlodipine.   Anemia of chronic kidney disease: Hemoglobin 10.1 - at goal. Normocytic. EPO on TTS HD treatments.  Secondary Hyperparathyroidism: outpatient labs on 8/8 with phos 5.9, calcium 7.8, PTH 897. Patient to take calcium carbonate with meals.   Diabetes mellitus type II with chronic kidney disease: insulin dependent. History of poor control. Hemoglobin A1c of  11% on 11/10/2020.    LOS: 0 Joene Gelder 8/21/20229:20 PM
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
## 2021-01-03 NOTE — H&P (Signed)
History and Physical    [name] IDC:[id number] DOB: [birth year] DOA: 01/03/2021  PCP: System, Provider Not In    Patient coming from:  Nephrology clinic  Chief Complaint:  Shortness of breath   HPI: [name] is a 45 y.o. female seen in ed with complaints of shortness of breath. Patient is coming to [place] from her nephrology appointment where patient was noted to be hypoxic at 86% and placed on 4 L of oxygen.  Patient does not have a history of chronic respiratory failure or home O2.  Patient also has a right IJ permacath for her hemodialysis and a left brachiocephalic AV access that was placed on 12/31/2020. SOB 10 days and cant lay flat, and sob with exertion,no chest pain but pressure. Pt states after starting her insulin she feels bad.      Chest pressure. 10/10, +SOB, Palpitation off and on.  Midsternal , NR. No nausea, vomiting.  Pt does report indigestion for which she uses TUMS.   All other systems reviewed and are negative.  Pt has past medical history of anemia, asthma, diabetes mellitus type 2, end-stage renal disease on hemodialysis, GERD, hypertension.  ED Course:  Vitals:   01/03/21 1957 01/03/21 2043 01/03/21 2055  BP: (!) 204/111  (!) 167/116  Pulse: 81 72   Resp: (!) '28 20 18  ' SpO2: (!) 86% 99%    Review of Systems:  Review of Systems  Constitutional:  Positive for malaise/fatigue.  Respiratory:  Positive for shortness of breath.   Cardiovascular:  Negative for chest pain.       Chest pressure. 10/10, +SOB, Palpitation off and on.  Midsternal , NR. No nausea, vomiting.    All other systems reviewed and are negative.   Past Medical History:  Diagnosis Date   Anemia    Asthma    well controlled   Complication of anesthesia    "takes alot for anesthesia to work"   Concussion    h/o   Depression    Diabetes ([place])    last A1c was 5.8 on 09-04-20   Dialysis patient ([place])    T, Th, and Sat   Family history of adverse reaction to  anesthesia    son-became aggressive after surgery   GERD (gastroesophageal reflux disease)    occ no meds   Headache    migraines   Hypertension    Palpitations    Pneumonia    h/o   Renal disorder     Past Surgical History:  Procedure Laterality Date   AV FISTULA PLACEMENT Left 11/20/2020   Procedure: ARTERIOVENOUS (AV) FISTULA CREATION ( BRACHIAL CEPHALIC );  Surgeon: Katha Cabal, MD;  Location: [facility] ORS;  Service: Vascular;  Laterality: Left;   CESAREAN SECTION N/A    x3   CHOLECYSTECTOMY     DIALYSIS/PERMA CATHETER INSERTION N/A 08/24/2020   Procedure: DIALYSIS/PERMA CATHETER INSERTION;  Surgeon: Algernon Huxley, MD;  Location: [place] [facility];  Service: Cardiovascular;  Laterality: N/A;     reports that she has never smoked. She has never used smokeless tobacco. She reports that she does not drink alcohol and does not use drugs.  Allergies  Allergen Reactions   Statins     Other reaction(s): Other (See Comments) "redness and facial swelling"   Cholesterol Rash    Cholesterol medicine    Family History  Problem Relation Age of Onset   Diabetes Mother    Skin cancer Mother    Heart attack Father  died at ~24 years of age   Alcohol abuse Brother    Diabetes Brother     Prior to Admission medications   Medication Sig Start Date End Date Taking? Authorizing Provider  acetaminophen (TYLENOL) 500 MG tablet Take 500-1,000 mg by mouth every 6 (six) hours as needed (pain).    [provider]  amLODipine (NORVASC) 5 MG tablet Take 1 tablet (5 mg total) by mouth once daily. 12/24/20   Iloabachie, Chioma E, NP  Blood Glucose Monitoring Suppl (RIGHTEST O6331619 BLOOD GLUCOSE) w/Device KIT USE AS DIRECTED UP TO FOUR TIMES DAILY. 12/24/20   Iloabachie, Chioma E, NP  Blood Pressure KIT USE AS DIRECTED. 11/17/20   Iloabachie, Chioma E, NP  CALCITRIOL IV Inject 2 mcg into the vein 3 (three) times a week.    [provider]  calcium carbonate (TUMS -  DOSED IN MG ELEMENTAL CALCIUM) 500 MG chewable tablet Chew 1 tablet (200 mg of elemental calcium total) by mouth 3 (three) times daily with meals. 09/01/20   Bonnielee Haff, MD  diphenhydrAMINE (BENADRYL) 25 MG tablet Take 25 mg by mouth every 6 (six) hours as needed. Taking as needed    [provider]  epoetin alfa (EPOGEN) 10000 UNIT/ML injection Inject 1,400 Units into the vein 3 (three) times a week.    [provider]  glucose blood (RIGHTEST GS550 BLOOD GLUCOSE) test strip USE AS DIRECTED UP TO FOUR TIMES DAILY. 12/24/20   Iloabachie, Chioma E, NP  Insulin Glargine (BASAGLAR KWIKPEN) 100 UNIT/ML Inject 14 Units into the skin once daily at bedtime. 12/24/20   Iloabachie, Chioma E, NP  Insulin Pen Needle 32G X 4 MM MISC USE AS DIRECTED ONCE DAILY AT BEDTIME. 12/24/20   Iloabachie, Chioma E, NP  Iron Sucrose (VENOFER IV) Inject 100 mg into the vein 3 (three) times a week.    [provider]  metoprolol succinate (TOPROL-XL) 50 MG 24 hr tablet Take 1 tablet (50 mg total) by mouth once daily. Take with or immediately following a meal. 12/24/20   Iloabachie, Chioma E, NP  Rightest GL300 Lancets MISC USE AS DIRECTED UP TO FOUR TIMES DAILY. 12/24/20   Langston Reusing, NP    Physical Exam: Vitals:   01/03/21 1957 01/03/21 2043 01/03/21 2055  BP: (!) 204/111  (!) 167/116  Pulse: 81 72   Resp: (!) '28 20 18  ' SpO2: (!) 86% 99%    Physical Exam Vitals reviewed.  Constitutional:      General: She is in acute distress.     Appearance: She is obese. She is not ill-appearing.  HENT:     Head: Normocephalic and atraumatic.     Right Ear: External ear normal.     Left Ear: External ear normal.     Nose: Nose normal.     Mouth/Throat:     Mouth: Mucous membranes are moist.  Eyes:     Extraocular Movements: Extraocular movements intact.  Neck:     Vascular: No carotid bruit.  Cardiovascular:     Rate and Rhythm: Normal rate and regular rhythm.     Pulses: Normal  pulses.     Heart sounds: Normal heart sounds.  Pulmonary:     Effort: Pulmonary effort is normal.     Breath sounds: Rales present.  Abdominal:     General: Bowel sounds are normal. There is no distension.     Palpations: Abdomen is soft. There is no mass.     Tenderness: There is no  abdominal tenderness. There is no guarding.     Hernia: No hernia is present.  Musculoskeletal:     Right lower leg: No edema.     Left lower leg: No edema.  Skin:    General: Skin is warm.  Neurological:     General: No focal deficit present.     Mental Status: She is alert and oriented to person, place, and time.  Psychiatric:        Mood and Affect: Mood normal.        Behavior: Behavior normal.    Labs on Admission: I have personally reviewed following labs and imaging studies  No results for input(s): CKTOTAL, CKMB, TROPONINI in the last 72 hours. Lab Results  Component Value Date   WBC 8.9 01/03/2021   HGB 10.1 (L) 01/03/2021   HCT 30.8 (L) 01/03/2021   MCV 90.9 01/03/2021   PLT 284 01/03/2021    Recent Labs  Lab 01/03/21 2011  NA 135  K 5.1  CL 103  CO2 24  BUN 39*  CREATININE 7.73*  CALCIUM 8.6*  PROT 6.7  BILITOT 0.5  ALKPHOS 101  ALT 11  AST 16  GLUCOSE 212*   No results found for: CHOL, HDL, LDLCALC, TRIG No results found for: DDIMER Invalid input(s): POCBNP  Urinalysis    Component Value Date/Time   COLORURINE YELLOW (A) 12/05/2020 1030   APPEARANCEUR CLOUDY (A) 12/05/2020 1030   LABSPEC 1.023 12/05/2020 1030   PHURINE 8.0 12/05/2020 1030   GLUCOSEU >=500 (A) 12/05/2020 1030   HGBUR NEGATIVE 12/05/2020 1030   BILIRUBINUR NEGATIVE 12/05/2020 1030   [place] 12/05/2020 1030   PROTEINUR >=300 (A) 12/05/2020 1030   NITRITE NEGATIVE 12/05/2020 1030   LEUKOCYTESUR NEGATIVE 12/05/2020 1030   COVID-19 Labs No results for input(s): DDIMER, FERRITIN, LDH, CRP in the last 72 hours.  Lab Results  Component Value Date   SARSCOV2NAA POSITIVE (A)  12/04/2020   [place] NEGATIVE 08/23/2020   [place] NEGATIVE 12/09/2019    Radiological Exams on Admission: DG Chest Portable 1 View  Result Date: 01/03/2021 CLINICAL DATA:  Shortness of breath. EXAM: PORTABLE CHEST 1 VIEW COMPARISON:  12/04/2020 FINDINGS: Patient has RIGHT-sided dialysis catheter, tips overlying the superior vena cava. The heart is enlarged. There are pulmonary opacities bilaterally, consistent with edema. No consolidations. No pleural effusions. IMPRESSION: Cardiomegaly and pulmonary edema. Electronically Signed   By: Nolon Nations M.D.   On: 01/03/2021 20:33    EKG: Independently reviewed.  Sr 72 WITH Q WAVES IN LEAD III, aVr.  Echocardiogram 11/2020 :  1. Left ventricular ejection fraction, by estimation, is 55 to 60%. The  left ventricle has normal function. The left ventricle has no regional  wall motion abnormalities. There is mild concentric left ventricular  hypertrophy. Left ventricular diastolic  parameters are consistent with Grade I diastolic dysfunction (impaired  relaxation).   2. Right ventricular systolic function is normal. The right ventricular  size is normal.   3. The mitral valve is normal in structure. No evidence of mitral valve  regurgitation. No evidence of mitral stenosis.   4. The aortic valve is normal in structure. Aortic valve regurgitation is  not visualized. No aortic stenosis is present.   5. The inferior vena cava is normal in size with greater than 50%  respiratory variability, suggesting right atrial pressure of 3 mmHg.    Assessment/Plan Principal Problem:   Respiratory failure with hypoxia (HCC) Active Problems:   Type 2 diabetes mellitus with diabetic chronic  kidney disease ([place])   Hypertensive urgency   End stage renal disease on dialysis ([place])   Anemia of chronic disease   Secondary hyperparathyroidism of renal origin ([place])   Acute respiratory failure with hypoxia (HCC)   Acute hypoxic respiratory failure:   Will admit patient to progressive unit with continuous cardiac monitoring for her respiratory failure secondary to her volume overload from her end-stage renal disease. Nephrology consult per a.m. team.  Patient follows up with Dr. Juleen China. Supplemental oxygen with goal O2 sats of 90% and above. Strict I's and O's, renal, heart healthy, consistent carb diet. We will also obtain continuous pulse oximetry and suspect OSA.   End-stage renal disease on hemodialysis: Per Dr. Tiana Loft note patient to receive emergent hemodialysis tonight.  Anemia of chronic kidney disease: Hemoglobin of 10.1, and has been chronically ranging from 7.4-11.5 over the past 2 years. Patient is currently receiving Epogen/ venofer with her HD treatments. Type and screen transfuse as indicated.  Diabetes mellitus type 2:  Poorly controlled with A1C of 11 on November 10, 2020. Home regimen of Basaglar held. Will continue sliding scale and glycemic protocol.  Hypertension: Blood pressure (!) 167/116, pulse 72, resp. rate 18, SpO2 99 %. We will continue patient on 5 mg of amlodipine, metoprolol/Toprol-XL 50 mg daily.  Secondary hyperparathyroidism: Continue patient on calcium carbonate.     DVT prophylaxis:  Heparin  Code Status:  Full code  Family Communication:  Ardine Eng (Daughter)  [phone] (Mobile)   Disposition Plan:  Home  Consults called:  Nephrology per a.m. team  Admission status: Inpatient   Para Skeans MD [facility] [phone] How to contact the [facility] Attending or Consulting provider [place] or covering provider during after hours [place], for this patient.    Check the care team in [facility] and look for a) attending/consulting [facility] provider listed and b) the [facility] team listed Log into [email or web address] and use [place] universal password to access. If you do not have the password, please contact the hospital operator. Locate the [facility] provider you are looking for under  [facility] and page to a number that you can be directly reached. If you still have difficulty reaching the provider, please page the [facility] (Director on Call) for the Hospitalists listed on amion for assistance. [email or web address] Password [facility] 01/03/2021, 10:31 PM

## 2021-01-04 ENCOUNTER — Inpatient Hospital Stay: Payer: Medicaid Other

## 2021-01-04 LAB — BASIC METABOLIC PANEL
Anion gap: 8 (ref 5–15)
BUN: 26 mg/dL — ABNORMAL HIGH (ref 6–20)
CO2: 28 mmol/L (ref 22–32)
Calcium: 8.6 mg/dL — ABNORMAL LOW (ref 8.9–10.3)
Chloride: 101 mmol/L (ref 98–111)
Creatinine, Ser: 5.88 mg/dL — ABNORMAL HIGH (ref 0.44–1.00)
GFR, Estimated: 8 mL/min — ABNORMAL LOW (ref >60)
Glucose, Bld: 121 mg/dL — ABNORMAL HIGH (ref 70–99)
Potassium: 4.3 mmol/L (ref 3.5–5.1)
Sodium: 137 mmol/L (ref 135–145)

## 2021-01-04 LAB — HEPATITIS B SURFACE ANTIBODY,QUALITATIVE: Hep B S Ab: NONREACTIVE

## 2021-01-04 LAB — GLUCOSE, CAPILLARY: Glucose-Capillary: 156 mg/dL — ABNORMAL HIGH (ref 70–99)

## 2021-01-04 LAB — HEPATITIS B CORE ANTIBODY, TOTAL: Hep B Core Total Ab: NONREACTIVE

## 2021-01-04 LAB — HIV ANTIBODY (ROUTINE TESTING W REFLEX): HIV Screen 4th Generation wRfx: NONREACTIVE

## 2021-01-04 LAB — CBG MONITORING, ED
Glucose-Capillary: 120 mg/dL — ABNORMAL HIGH (ref 70–99)
Glucose-Capillary: 209 mg/dL — ABNORMAL HIGH (ref 70–99)

## 2021-01-04 LAB — RESP PANEL BY RT-PCR (FLU A&B, COVID) ARPGX2
Influenza A by PCR: NEGATIVE
Influenza B by PCR: NEGATIVE
SARS Coronavirus 2 by RT PCR: POSITIVE — AB

## 2021-01-04 LAB — HEPATITIS B SURFACE ANTIGEN: Hepatitis B Surface Ag: NONREACTIVE

## 2021-01-04 MED ORDER — AMLODIPINE BESYLATE 10 MG PO TABS
10.0000 mg | ORAL_TABLET | Freq: Every day | ORAL | Status: DC
  Administered 2021-01-05: 10 mg via ORAL
  Filled 2021-01-04: qty 1

## 2021-01-04 MED ORDER — ONDANSETRON HCL 4 MG/2ML IJ SOLN
4.0000 mg | Freq: Four times a day (QID) | INTRAMUSCULAR | Status: DC | PRN
  Administered 2021-01-04: 4 mg via INTRAVENOUS
  Filled 2021-01-04: qty 2

## 2021-01-04 MED ORDER — EPOETIN ALFA 4000 UNIT/ML IJ SOLN
4000.0000 [IU] | INTRAMUSCULAR | Status: DC
  Filled 2021-01-04 (×2): qty 1

## 2021-01-04 MED ORDER — HYDRALAZINE HCL 20 MG/ML IJ SOLN
10.0000 mg | Freq: Four times a day (QID) | INTRAMUSCULAR | Status: DC | PRN
  Administered 2021-01-05: 10 mg via INTRAVENOUS
  Filled 2021-01-04: qty 1

## 2021-01-04 MED ORDER — HEPARIN SOD (PORK) LOCK FLUSH 100 UNIT/ML IV SOLN
INTRAVENOUS | Status: AC
  Filled 2021-01-04: qty 5

## 2021-01-04 MED ORDER — ACETAMINOPHEN 500 MG PO TABS
ORAL_TABLET | ORAL | Status: AC
  Filled 2021-01-04: qty 2

## 2021-01-04 MED ORDER — SODIUM CHLORIDE 0.9% FLUSH: 3.0000 mL | INTRAVENOUS | Status: DC | PRN

## 2021-01-04 MED ORDER — SODIUM CHLORIDE 0.9% FLUSH
3.0000 mL | Freq: Two times a day (BID) | INTRAVENOUS | Status: DC
  Administered 2021-01-04 – 2021-01-05 (×3): 3 mL via INTRAVENOUS

## 2021-01-04 MED ORDER — DIPHENHYDRAMINE HCL 25 MG PO CAPS
25.0000 mg | ORAL_CAPSULE | Freq: Four times a day (QID) | ORAL | Status: DC | PRN
  Administered 2021-01-04 – 2021-01-05 (×2): 25 mg via ORAL
  Filled 2021-01-04 (×2): qty 1

## 2021-01-04 MED ORDER — METOPROLOL SUCCINATE ER 50 MG PO TB24
50.0000 mg | ORAL_TABLET | Freq: Every day | ORAL | Status: DC
  Administered 2021-01-04 – 2021-01-05 (×2): 50 mg via ORAL
  Filled 2021-01-04 (×2): qty 1

## 2021-01-04 MED ORDER — SODIUM CHLORIDE 0.9 % IV SOLN: 250.0000 mL | INTRAVENOUS | Status: DC | PRN

## 2021-01-04 MED ORDER — TECHNETIUM TO 99M ALBUMIN AGGREGATED
4.0000 | Freq: Once | INTRAVENOUS | Status: AC | PRN
  Administered 2021-01-04: 4.64 via INTRAVENOUS

## 2021-01-04 MED ORDER — HEPARIN SODIUM (PORCINE) 5000 UNIT/ML IJ SOLN
5000.0000 [IU] | Freq: Three times a day (TID) | INTRAMUSCULAR | Status: DC
  Administered 2021-01-04 – 2021-01-05 (×4): 5000 [IU] via SUBCUTANEOUS
  Filled 2021-01-04 (×4): qty 1

## 2021-01-04 MED ORDER — INSULIN GLARGINE-YFGN 100 UNIT/ML ~~LOC~~ SOLN
14.0000 [IU] | Freq: Every day | SUBCUTANEOUS | Status: DC
  Administered 2021-01-05: 14 [IU] via SUBCUTANEOUS
  Filled 2021-01-04 (×2): qty 0.14

## 2021-01-04 MED ORDER — AMLODIPINE BESYLATE 5 MG PO TABS
5.0000 mg | ORAL_TABLET | Freq: Every day | ORAL | Status: DC
  Administered 2021-01-04: 5 mg via ORAL
  Filled 2021-01-04: qty 1

## 2021-01-04 MED ORDER — ACETAMINOPHEN 500 MG PO TABS
500.0000 mg | ORAL_TABLET | Freq: Four times a day (QID) | ORAL | Status: DC | PRN
  Administered 2021-01-04 – 2021-01-05 (×3): 1000 mg via ORAL
  Filled 2021-01-04 (×2): qty 2

## 2021-01-04 MED ORDER — CALCIUM CARBONATE ANTACID 500 MG PO CHEW
1.0000 | CHEWABLE_TABLET | Freq: Three times a day (TID) | ORAL | Status: DC
  Administered 2021-01-04 – 2021-01-05 (×4): 200 mg via ORAL
  Filled 2021-01-04 (×4): qty 1

## 2021-01-04 NOTE — ED Notes (Signed)
Patient transported to nuc med

## 2021-01-04 NOTE — ED Notes (Signed)
Pt set up with breakfast tray.

## 2021-01-04 NOTE — Progress Notes (Signed)
[facility]  ROUNDING NOTE   Subjective:   [name] was admitted to [facility] on 01/03/2021 for Acute respiratory failure with hypoxia ([place]) [J96.01]  Emergent hemodialysis treatment last night due to hypertensive urgency and pulmonary edema.  UF of 2.5 liters.   Patient continues to have shortness of breath. She is asking for another hemodialysis treatment.   Objective:  Vital signs in last 24 hours:  Temp:  [98.2 F (36.8 C)] 98.2 F (36.8 C) (08/21 2340) Pulse Rate:  [67-82] 72 (08/22 0930) Resp:  [0-28] 16 (08/22 0930) BP: (128-204)/(80-116) 138/84 (08/22 0930) SpO2:  [86 %-100 %] 99 % (08/22 0930)  Weight change:  There were no vitals filed for this visit.  Intake/Output: I/O last 3 completed shifts: In: -  Out: 2541 [Other:2541]   Intake/Output this shift:  No intake/output data recorded.  Physical Exam: General: Sitting up in stretcher,   Head: Normocephalic, atraumatic. Moist oral mucosal membranes  Eyes: Anicteric, PERRL  Neck: Supple, trachea midline  Lungs:  Bilateral crackles at bases, breathing room air  Heart: Regular rate and rhythm  Abdomen:  Soft, nontender, obese  Extremities:  No peripheral edema.  Neurologic: Nonfocal, moving all four extremities  Skin: No lesions  Access: RIJ permcath, left AVF maturing    Basic Metabolic Panel: Recent Labs  Lab 01/03/21 2011 01/04/21 0652  NA 135 137  K 5.1 4.3  CL 103 101  CO2 24 28  GLUCOSE 212* 121*  BUN 39* 26*  CREATININE 7.73* 5.88*  CALCIUM 8.6* 8.6*     Liver Function Tests: Recent Labs  Lab 01/03/21 2011  AST 16  ALT 11  ALKPHOS 101  BILITOT 0.5  PROT 6.7  ALBUMIN 3.4*    No results for input(s): LIPASE, AMYLASE in the last 168 hours. No results for input(s): AMMONIA in the last 168 hours.  CBC: Recent Labs  Lab 01/03/21 2011  WBC 8.9  HGB 10.1*  HCT 30.8*  MCV 90.9  PLT 284     Cardiac Enzymes: No results for input(s): CKTOTAL, CKMB, CKMBINDEX,  TROPONINI in the last 168 hours.  BNP: Invalid input(s): POCBNP  CBG: Recent Labs  Lab 01/04/21 0716  GLUCAP 120*    Microbiology: Results for orders placed or performed during the hospital encounter of 01/03/21  Resp Panel by RT-PCR (Flu A&B, Covid) Nasopharyngeal Swab     Status: Abnormal   Collection Time: 01/03/21  9:59 PM   Specimen: Nasopharyngeal Swab; Nasopharyngeal(NP) swabs in vial transport medium  Result Value Ref Range Status   SARS Coronavirus 2 by RT PCR POSITIVE (A) NEGATIVE Final    Comment: RESULT CALLED TO, READ BACK BY AND VERIFIED WITH: CALEB BURGESS '@0031'  01/04/21 LFD (NOTE) SARS-CoV-2 target nucleic acids are DETECTED.  The SARS-CoV-2 RNA is generally detectable in upper respiratory specimens during the acute phase of infection. Positive results are indicative of the presence of the identified virus, but do not rule out bacterial infection or co-infection with other pathogens not detected by the test. Clinical correlation with patient history and other diagnostic information is necessary to determine patient infection status. The expected result is Negative.  Fact Sheet for Patients: EntrepreneurPulse.com.au  Fact Sheet for Healthcare Providers: IncredibleEmployment.be  This test is not yet approved or cleared by the [place] FDA and  has been authorized for detection and/or diagnosis of SARS-CoV-2 by FDA under an Emergency Use Authorization (EUA).  This EUA will remain in effect (meaning this test can be  used) for  the duration of  the COVID-19 declaration under Section 564(b)(1) of the Act, 21 U.S.C. section 360bbb-3(b)(1), unless the authorization is terminated or revoked sooner.     Influenza A by PCR NEGATIVE NEGATIVE Final   Influenza B by PCR NEGATIVE NEGATIVE Final    Comment: (NOTE) The Xpert Xpress SARS-CoV-2/FLU/RSV plus assay is intended as an aid in the diagnosis of influenza from  Nasopharyngeal swab specimens and should not be used as a sole basis for treatment. Nasal washings and aspirates are unacceptable for Xpert Xpress SARS-CoV-2/FLU/RSV testing.  Fact Sheet for Patients: EntrepreneurPulse.com.au  Fact Sheet for Healthcare Providers: IncredibleEmployment.be  This test is not yet approved or cleared by the [place] FDA and has been authorized for detection and/or diagnosis of SARS-CoV-2 by FDA under an Emergency Use Authorization (EUA). This EUA will remain in effect (meaning this test can be used) for the duration of the COVID-19 declaration under Section 564(b)(1) of the Act, 21 U.S.C. section 360bbb-3(b)(1), unless the authorization is terminated or revoked.  Performed at [facility], [address] [place], [place] [address]     Coagulation Studies: No results for input(s): LABPROT, INR in the last 72 hours.  Urinalysis: No results for input(s): COLORURINE, LABSPEC, PHURINE, GLUCOSEU, HGBUR, BILIRUBINUR, KETONESUR, PROTEINUR, UROBILINOGEN, NITRITE, LEUKOCYTESUR in the last 72 hours.  Invalid input(s): APPERANCEUR    Imaging: DG Chest Portable 1 View  Result Date: 01/03/2021 CLINICAL DATA:  Shortness of breath. EXAM: PORTABLE CHEST 1 VIEW COMPARISON:  12/04/2020 FINDINGS: Patient has RIGHT-sided dialysis catheter, tips overlying the superior vena cava. The heart is enlarged. There are pulmonary opacities bilaterally, consistent with edema. No consolidations. No pleural effusions. IMPRESSION: Cardiomegaly and pulmonary edema. Electronically Signed   By: Nolon Nations M.D.   On: 01/03/2021 20:33     Medications:    Current Facility-Administered Medications:    0.9 %  sodium chloride infusion, 250 mL, Intravenous, PRN, Para Skeans, MD   acetaminophen (TYLENOL) tablet 500-1,000 mg, 500-1,000 mg, Oral, Q6H PRN, Para Skeans, MD, 1,000 mg at 01/04/21 0657   amLODipine (NORVASC) tablet 5  mg, 5 mg, Oral, Daily, Para Skeans, MD, 5 mg at 01/04/21 3220   calcium carbonate (TUMS - dosed in mg elemental calcium) chewable tablet 200 mg of elemental calcium, 1 tablet, Oral, TID WC, Para Skeans, MD, 200 mg of elemental calcium at 01/04/21 0809   Chlorhexidine Gluconate Cloth 2 % PADS 6 each, 6 each, Topical, Q0600, Marion Rosenberry, MD   diphenhydrAMINE (BENADRYL) capsule 25 mg, 25 mg, Oral, Q6H PRN, Enzo Bi, MD, 25 mg at 01/04/21 0809   heparin injection 5,000 Units, 5,000 Units, Subcutaneous, Q8H, Para Skeans, MD, 5,000 Units at 01/04/21 0654   insulin aspart (novoLOG) injection 0-6 Units, 0-6 Units, Subcutaneous, TID WC, Patel, Gretta Cool, MD   LORazepam (ATIVAN) tablet 0.5 mg, 0.5 mg, Oral, BID PRN, Para Skeans, MD   metoprolol succinate (TOPROL-XL) 24 hr tablet 50 mg, 50 mg, Oral, Daily, Florina Ou V, MD, 50 mg at 01/04/21 0938   ondansetron (ZOFRAN) injection 4 mg, 4 mg, Intravenous, Q6H PRN, Florina Ou V, MD   sodium chloride flush (NS) 0.9 % injection 3 mL, 3 mL, Intravenous, Q12H, Florina Ou V, MD, 3 mL at 01/04/21 2542   sodium chloride flush (NS) 0.9 % injection 3 mL, 3 mL, Intravenous, PRN, Para Skeans, MD  Current Outpatient Medications:    acetaminophen (TYLENOL) 500 MG tablet, Take 500-1,000 mg by mouth every 6 (  six) hours as needed (pain)., Disp: , Rfl:    amLODipine (NORVASC) 5 MG tablet, Take 1 tablet (5 mg total) by mouth once daily., Disp: 60 tablet, Rfl: 0   Blood Glucose Monitoring Suppl (RIGHTEST GM550 BLOOD GLUCOSE) w/Device KIT, USE AS DIRECTED UP TO FOUR TIMES DAILY., Disp: 1 kit, Rfl: 0   Blood Pressure KIT, USE AS DIRECTED., Disp: 1 kit, Rfl: 0   CALCITRIOL IV, Inject 2 mcg into the vein 3 (three) times a week., Disp: , Rfl:    calcium carbonate (TUMS - DOSED IN MG ELEMENTAL CALCIUM) 500 MG chewable tablet, Chew 1 tablet (200 mg of elemental calcium total) by mouth 3 (three) times daily with meals., Disp: 90 tablet, Rfl: 1   diphenhydrAMINE (BENADRYL)  25 MG tablet, Take 25 mg by mouth every 6 (six) hours as needed. Taking as needed, Disp: , Rfl:    epoetin alfa (EPOGEN) 10000 UNIT/ML injection, Inject 1,400 Units into the vein 3 (three) times a week., Disp: , Rfl:    glucose blood (RIGHTEST GS550 BLOOD GLUCOSE) test strip, USE AS DIRECTED UP TO FOUR TIMES DAILY., Disp: 100 strip, Rfl: 0   Insulin Glargine (BASAGLAR KWIKPEN) 100 UNIT/ML, Inject 14 Units into the skin once daily at bedtime., Disp: 15 mL, Rfl: 3   Insulin Pen Needle 32G X 4 MM MISC, USE AS DIRECTED ONCE DAILY AT BEDTIME., Disp: 100 each, Rfl: 2   Iron Sucrose (VENOFER IV), Inject 100 mg into the vein 3 (three) times a week., Disp: , Rfl:    metoprolol succinate (TOPROL-XL) 50 MG 24 hr tablet, Take 1 tablet (50 mg total) by mouth once daily. Take with or immediately following a meal., Disp: 30 tablet, Rfl: 3   Rightest GL300 Lancets MISC, USE AS DIRECTED UP TO FOUR TIMES DAILY., Disp: 100 each, Rfl: 0    Assessment/ Plan:  [name] is a 45 y.o. Hispanic female with end stage renal disease on hemodialysis, diabetes mellitus type II, asthma, hypertension, depression, GERD, who is admitted to [facility] on 01/03/2021 for Acute respiratory failure with hypoxia ([place]) [J96.01]  [facility] MWF [place] 120kg  End Stage Renal Disease: patient with pulmonary edema requiring oxygen and hypertensive urgency on admission.  - Dialysis treatment today. Orders prepared. UF goal of 2 -2.5 liters - Needs outpatient dry weight adjusted.   Hypertension: with urgency on admission . Now at goal, 138/84. Continue home regimen of metoprolol and amlodipine.   Anemia of chronic kidney disease: Hemoglobin 10.1 - at goal. Normocytic.  - EPO with HD treatment today.   Secondary Hyperparathyroidism: outpatient labs on 8/8 with phos 5.9, calcium 7.8, PTH 897. Patient to take calcium carbonate with meals.   Diabetes mellitus type II with chronic kidney disease: insulin dependent.  History of poor control. Hemoglobin A1c of  11% on 11/10/2020.    LOS: 1 Dollye Glasser 8/22/202210:56 AM

## 2021-01-04 NOTE — Progress Notes (Signed)
PROGRESS NOTE    [name]  [id number] DOB: Jun 16, 1975 DOA: 01/03/2021 PCP: System, Provider Not In  214A/214A-AA   Assessment & Plan:   Principal Problem:   Respiratory failure with hypoxia ([place]) Active Problems:   Type 2 diabetes mellitus with diabetic chronic kidney disease ([place])   Hypertensive urgency   End stage renal disease on dialysis ([place])   Anemia of chronic disease   Secondary hyperparathyroidism of renal origin ([place])   Acute respiratory failure with hypoxia ([place])   [name] is a 45 y.o. female seen in ed with complaints of shortness of breath. Patient is coming to [place] from her nephrology appointment where patient was noted to be hypoxic at 86% and placed on 4 L of oxygen.  Dyspnea for 10 days and cant lay flat.   Acute hypoxic respiratory failure 2/2 Pulm edema 2/2 volume overload  --received dialysis with improvement in symptom and no longer needing supplemental oxygen. Plan: --inpatient HD today again, per nephro --can go to MedSurg  End-stage renal disease on hemodialysis MWF --recently started on dialysis --inpatient HD today again, per nephro   Anemia of chronic kidney disease: Hemoglobin of 10.1, and has been chronically ranging from 7.4-11.5 over the past 2 years. --Epo with dialysis   Diabetes mellitus type 2:  Poorly controlled with A1C of 11 on November 10, 2020. --resume home glargine 14u nightly --SSI   Hypertension: --varied widely --increase amlodipine to 10 mg daily --cont metoprolol/Toprol-XL 50 mg daily. --IV hdralazine PRN   Secondary hyperparathyroidism: Continue patient on calcium carbonate.     DVT prophylaxis: Heparin SQ Code Status: Full code  Family Communication:  Level of care: Med-Surg Dispo:   The patient is from: home Anticipated d/c is to: home Anticipated d/c date is: tomorrow Patient currently is not medically ready to d/c due to: need repeat dialysis today   Subjective and Interval History:  Pt  reported dyspnea improved, now on room air.  No swelling.    Plan for inpatient dialysis again today.   Objective: Vitals:   01/04/21 1230 01/04/21 1330 01/04/21 1400 01/04/21 1921  BP: 130/66 140/77 (!) 144/72 (!) 183/95  Pulse: 68 67 73 71  Resp: '16 16 20 15  '$ Temp:    98 F (36.7 C)  TempSrc:    Oral  SpO2: 97% 97% 94%     Intake/Output Summary (Last 24 hours) at 01/04/2021 2309 Last data filed at 01/04/2021 0215 Gross per 24 hour  Intake --  Output 2541 ml  Net -2541 ml   There were no vitals filed for this visit.  Examination:   Constitutional: NAD, AAOx3 HEENT: conjunctivae and lids normal, EOMI CV: No cyanosis.   RESP: normal respiratory effort, on RA Extremities: No effusions, edema in BLE SKIN: warm, dry Neuro: II - XII grossly intact.   Psych: depressed mood and affect.  Appropriate judgement and reason   Data Reviewed: I have personally reviewed following labs and imaging studies  CBC: Recent Labs  Lab 01/03/21 2011  WBC 8.9  HGB 10.1*  HCT 30.8*  MCV 90.9  PLT [id number]   Basic Metabolic Panel: Recent Labs  Lab 01/03/21 2011 01/04/21 0652  NA 135 137  K 5.1 4.3  CL 103 101  CO2 24 28  GLUCOSE 212* 121*  BUN 39* 26*  CREATININE 7.73* 5.88*  CALCIUM 8.6* 8.6*   GFR: Estimated Creatinine Clearance: 14.9 mL/min (A) (by C-G formula based on SCr of 5.88 mg/dL (H)). Liver Function Tests: Recent Labs  Lab 01/03/21 2011  AST 16  ALT 11  ALKPHOS 101  BILITOT 0.5  PROT 6.7  ALBUMIN 3.4*   No results for input(s): LIPASE, AMYLASE in the last 168 hours. No results for input(s): AMMONIA in the last 168 hours. Coagulation Profile: No results for input(s): INR, PROTIME in the last 168 hours. Cardiac Enzymes: No results for input(s): CKTOTAL, CKMB, CKMBINDEX, TROPONINI in the last 168 hours. BNP (last 3 results) No results for input(s): PROBNP in the last 8760 hours. HbA1C: No results for input(s): HGBA1C in the last 72 hours. CBG: Recent Labs   Lab 01/04/21 0716 01/04/21 1121  GLUCAP 120* 209*   Lipid Profile: No results for input(s): CHOL, HDL, LDLCALC, TRIG, CHOLHDL, LDLDIRECT in the last 72 hours. Thyroid Function Tests: No results for input(s): TSH, T4TOTAL, FREET4, T3FREE, THYROIDAB in the last 72 hours. Anemia Panel: No results for input(s): VITAMINB12, FOLATE, FERRITIN, TIBC, IRON, RETICCTPCT in the last 72 hours. Sepsis Labs: No results for input(s): PROCALCITON, LATICACIDVEN in the last 168 hours.  Recent Results (from the past 240 hour(s))  Resp Panel by RT-PCR (Flu A&B, Covid) Nasopharyngeal Swab     Status: Abnormal   Collection Time: 01/03/21  9:59 PM   Specimen: Nasopharyngeal Swab; Nasopharyngeal(NP) swabs in vial transport medium  Result Value Ref Range Status   SARS Coronavirus 2 by RT PCR POSITIVE (A) NEGATIVE Final    Comment: RESULT CALLED TO, READ BACK BY AND VERIFIED WITH: CALEB BURGESS '@0031'$  01/04/21 LFD (NOTE) SARS-CoV-2 target nucleic acids are DETECTED.  The SARS-CoV-2 RNA is generally detectable in upper respiratory specimens during the acute phase of infection. Positive results are indicative of the presence of the identified virus, but do not rule out bacterial infection or co-infection with other pathogens not detected by the test. Clinical correlation with patient history and other diagnostic information is necessary to determine patient infection status. The expected result is Negative.  Fact Sheet for Patients: EntrepreneurPulse.com.au  Fact Sheet for Healthcare Providers: IncredibleEmployment.be  This test is not yet approved or cleared by the [place] FDA and  has been authorized for detection and/or diagnosis of SARS-CoV-2 by FDA under an Emergency Use Authorization (EUA).  This EUA will remain in effect (meaning this test can be  used) for the duration of  the COVID-19 declaration under Section 564(b)(1) of the Act, 21 U.S.C. section  360bbb-3(b)(1), unless the authorization is terminated or revoked sooner.     Influenza A by PCR NEGATIVE NEGATIVE Final   Influenza B by PCR NEGATIVE NEGATIVE Final    Comment: (NOTE) The Xpert Xpress SARS-CoV-2/FLU/RSV plus assay is intended as an aid in the diagnosis of influenza from Nasopharyngeal swab specimens and should not be used as a sole basis for treatment. Nasal washings and aspirates are unacceptable for Xpert Xpress SARS-CoV-2/FLU/RSV testing.  Fact Sheet for Patients: EntrepreneurPulse.com.au  Fact Sheet for Healthcare Providers: IncredibleEmployment.be  This test is not yet approved or cleared by the [place] FDA and has been authorized for detection and/or diagnosis of SARS-CoV-2 by FDA under an Emergency Use Authorization (EUA). This EUA will remain in effect (meaning this test can be used) for the duration of the COVID-19 declaration under Section 564(b)(1) of the Act, 21 U.S.C. section 360bbb-3(b)(1), unless the authorization is terminated or revoked.  Performed at [facility], [address], [place], [place] [address]       Radiology Studies: NM Pulmonary Perfusion  Result Date: 01/04/2021 CLINICAL DATA:  Respiratory failure, COVID, asthma, rule  out PE EXAM: NUCLEAR MEDICINE PERFUSION LUNG SCAN TECHNIQUE: Perfusion images were obtained in multiple projections after intravenous injection of radiopharmaceutical. Ventilation scans intentionally deferred if perfusion scan and chest x-ray adequate for interpretation during COVID 19 epidemic. RADIOPHARMACEUTICALS:  4.64 mCi Tc-7mMAA IV COMPARISON:  Chest radiograph, 01/03/2021 FINDINGS: Normal, homogeneous perfusion of the lungs. No suspicious perfusion defect. Cardiomegaly. IMPRESSION: 1. Very low probability for pulmonary embolism by modified perfusion only PIOPED criteria (PE absent). 2.  Cardiomegaly. Electronically Signed   By: AEddie CandleM.D.   On:  01/04/2021 11:53   DG Chest Portable 1 View  Result Date: 01/03/2021 CLINICAL DATA:  Shortness of breath. EXAM: PORTABLE CHEST 1 VIEW COMPARISON:  12/04/2020 FINDINGS: Patient has RIGHT-sided dialysis catheter, tips overlying the superior vena cava. The heart is enlarged. There are pulmonary opacities bilaterally, consistent with edema. No consolidations. No pleural effusions. IMPRESSION: Cardiomegaly and pulmonary edema. Electronically Signed   By: ENolon NationsM.D.   On: 01/03/2021 20:33     Scheduled Meds:  acetaminophen       amLODipine  5 mg Oral Daily   calcium carbonate  1 tablet Oral TID WC   Chlorhexidine Gluconate Cloth  6 each Topical Q0600   epoetin (EPOGEN/PROCRIT) injection  4,000 Units Intravenous Q M,W,F-HD   heparin  5,000 Units Subcutaneous Q8H   insulin aspart  0-6 Units Subcutaneous TID WC   metoprolol succinate  50 mg Oral Daily   sodium chloride flush  3 mL Intravenous Q12H   Continuous Infusions:  sodium chloride       LOS: 1 day     TEnzo Bi MD [facility] If 7PM-7AM, please contact night-coverage 01/04/2021, 11:09 PM

## 2021-01-04 NOTE — ED Notes (Signed)
Pt set up with lunch tray

## 2021-01-04 NOTE — Progress Notes (Signed)
Spoke with patient about wearing CPAP; c/o headache and wants to get pain under control before trying CPAP.  Nurse informed of patient's request, will have CPAP ready when patient decides she wants to try device.

## 2021-01-04 NOTE — Progress Notes (Signed)
Patient tolerated 3 hrs of HD treatment via R CVC.  UF goal of 2.5 achieved.  Report given to primary nurse

## 2021-01-04 NOTE — Progress Notes (Signed)
Pt.with right chest CVC,functioning well. BFR maintained throughout tx. Unable to reach targeted UF d/t pt. request to terminate tx secondary to feeling nausea, and headache. Fld removed [id number], mins lost 19. Pt transferred to room 214.

## 2021-01-05 ENCOUNTER — Other Ambulatory Visit: Payer: Self-pay

## 2021-01-05 LAB — BASIC METABOLIC PANEL
Anion gap: 9 (ref 5–15)
BUN: 24 mg/dL — ABNORMAL HIGH (ref 6–20)
CO2: 29 mmol/L (ref 22–32)
Calcium: 8.2 mg/dL — ABNORMAL LOW (ref 8.9–10.3)
Chloride: 96 mmol/L — ABNORMAL LOW (ref 98–111)
Creatinine, Ser: 5.78 mg/dL — ABNORMAL HIGH (ref 0.44–1.00)
GFR, Estimated: 9 mL/min — ABNORMAL LOW (ref >60)
Glucose, Bld: 165 mg/dL — ABNORMAL HIGH (ref 70–99)
Potassium: 4.3 mmol/L (ref 3.5–5.1)
Sodium: 134 mmol/L — ABNORMAL LOW (ref 135–145)

## 2021-01-05 LAB — MAGNESIUM: Magnesium: 1.9 mg/dL (ref 1.7–2.4)

## 2021-01-05 LAB — CBC
HCT: 28.8 % — ABNORMAL LOW (ref 36.0–46.0)
Hemoglobin: 9.8 g/dL — ABNORMAL LOW (ref 12.0–15.0)
MCH: 30.1 pg (ref 26.0–34.0)
MCHC: 34 g/dL (ref 30.0–36.0)
MCV: 88.3 fL (ref 80.0–100.0)
Platelets: 270 10*3/uL (ref 150–400)
RBC: 3.26 MIL/uL — ABNORMAL LOW (ref 3.87–5.11)
RDW: 13 % (ref 11.5–15.5)
WBC: 7.1 10*3/uL (ref 4.0–10.5)
nRBC: 0 % (ref 0.0–0.2)

## 2021-01-05 LAB — GLUCOSE, CAPILLARY
Glucose-Capillary: 158 mg/dL — ABNORMAL HIGH (ref 70–99)
Glucose-Capillary: 173 mg/dL — ABNORMAL HIGH (ref 70–99)

## 2021-01-05 MED ORDER — AMLODIPINE BESYLATE 10 MG PO TABS
10.0000 mg | ORAL_TABLET | Freq: Every day | ORAL | 2 refills | Status: DC
  Filled 2021-01-05: qty 30, 30d supply, fill #0
  Filled 2021-02-25: qty 30, 30d supply, fill #1

## 2021-01-05 NOTE — TOC Transition Note (Signed)
Transition of Care [facility]) - CM/SW Discharge Note   Patient Details  Name: [name] MRN: [id number] Date of Birth: 11-13-1975  [facility]) CM/SW Contact:  Candie Chroman, LCSW Phone Number: 01/05/2021, 1:51 PM   Clinical Narrative:   Patient has orders to discharge home today. MD aware discharge summary needed to send to HD coordinator. No further concerns. CSW signing off.  Final next level of care: Home/Self Care Barriers to Discharge: No Barriers Identified   Patient Goals and CMS Choice        Discharge Placement                Patient to be transferred to facility by: Daughter will pick her up   Patient and family notified of of transfer: 01/05/21  Discharge Plan and Services     Post Acute Care Choice: NA                               Social Determinants of Health (SDOH) Interventions     Readmission Risk Interventions Readmission Risk Prevention Plan 01/05/2021 12/07/2020  Transportation Screening Complete Complete  PCP or Specialist Appt within 3-5 Days Complete Complete  HRI or [place] - Complete  Social Work Consult for [place] Planning/Counseling Complete Complete  Palliative Care Screening Not Applicable Not Applicable  Medication Review Press photographer) Complete Complete

## 2021-01-05 NOTE — Plan of Care (Signed)
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
## 2021-01-05 NOTE — Progress Notes (Signed)
Patient refused CPAP; patient is claustrophobic.

## 2021-01-05 NOTE — Discharge Summary (Addendum)
Physician Discharge Summary   [name]  female DOB: 1975/10/16  [id number]  PCP: System, Provider Not In  Admit date: 01/03/2021 Discharge date: 01/05/2021  Admitted From: home Disposition:  home CODE STATUS: Full code  Discharge Instructions     Discharge instructions   Complete by: As directed    You had fluid in your lungs that was causing your breathing difficulty.  After extra dialysis, you have improved and no longer need supplemental oxygen.  Please be sure to stay for the full sessions of dialysis to avoid more fluid buildup.   Dr. Enzo Bi - -   No wound care   Complete by: As directed         Hospital Course:  For full details, please see H&P, progress notes, consult notes and ancillary notes.  Briefly,  [name] is a 45 y.o. female seen in ed with complaints of shortness of breath. Patient came to [place] from her nephrology appointment where patient was noted to be hypoxic at 86% and placed on 4 L of oxygen.  Dyspnea for 10 days and couldn't lay flat.   Acute hypoxic respiratory failure 2/2 Pulm edema 2/2 volume overload  --received extra dialysis with improvement in symptom and no longer needing supplemental oxygen. --per nephrology, pt was noted to get off dialysis sessions early, so pt encouraged to stay for full sessions at outpatient dialysis center to avoid future volume overload.   End-stage renal disease on hemodialysis MWF --recently started on dialysis --pt received extra inpatient dialysis sessions for volume removal, and was cleared for discharge by nephrology to continue outpatient HD schedule.   Anemia of chronic kidney disease: Hemoglobin of 10.1, and has been chronically ranging from 7.4-11.5 over the past 2 years. --Epo with dialysis   Diabetes mellitus type 2:  Poorly controlled with A1C of 11 on November 10, 2020. --cont home Basaglar 14u nightly.   Hypertension: --BP varied widely --increase amlodipine to 10 mg  daily --cont Toprol-XL 50 mg daily.   Secondary hyperparathyroidism: Continue patient on calcium carbonate.    Covid -19 is no longer an active infection despite testing positive, since pt was tested positive on 12/04/20, about 1 month ago.   Discharge Diagnoses:  Principal Problem:   Respiratory failure with hypoxia ([place]) Active Problems:   Type 2 diabetes mellitus with diabetic chronic kidney disease ([place])   Hypertensive urgency   End stage renal disease on dialysis ([place])   Anemia of chronic disease   Secondary hyperparathyroidism of renal origin ([place])   Acute respiratory failure with hypoxia ([place])   30 Day Unplanned Readmission Risk Score    Flowsheet Row ED to Hosp-Admission (Current) from 01/03/2021 in [facility]  30 Day Unplanned Readmission Risk Score (%) 24.32 Filed at 01/05/2021 0801       This score is the patient's risk of an unplanned readmission within 30 days of being discharged (0 -100%). The score is based on dignosis, age, lab data, medications, orders, and past utilization.   Low:  0-14.9   Medium: 15-21.9   High: 22-29.9   Extreme: 30 and above         Discharge Instructions:  Allergies as of 01/05/2021       Reactions   Statins    Other reaction(s): Other (See Comments) "redness and facial swelling"   Cholesterol Rash   Cholesterol medicine        Medication List     TAKE these medications  acetaminophen 500 MG tablet Commonly known as: TYLENOL Take 500-1,000 mg by mouth every 6 (six) hours as needed (pain).   amLODipine 10 MG tablet Commonly known as: NORVASC Take 1 tablet (10 mg total) by mouth daily. What changed:  medication strength how much to take   Basaglar KwikPen 100 UNIT/ML Inject 14 Units into the skin once daily at bedtime.   Blood Pressure Kit USE AS DIRECTED.   CALCITRIOL IV Inject 2 mcg into the vein 3 (three) times a week.   calcium acetate 667 MG tablet Commonly known  as: PHOSLO Take 1,334 mg by mouth 3 (three) times daily.   calcium carbonate 500 MG chewable tablet Commonly known as: TUMS - dosed in mg elemental calcium Chew 1 tablet (200 mg of elemental calcium total) by mouth 3 (three) times daily with meals.   Comfort EZ Pen Needles 32G X 4 MM Misc Generic drug: Insulin Pen Needle USE AS DIRECTED ONCE DAILY AT BEDTIME.   diphenhydrAMINE 25 MG tablet Commonly known as: BENADRYL Take 25 mg by mouth every 6 (six) hours as needed. Taking as needed   epoetin alfa 10000 UNIT/ML injection Commonly known as: EPOGEN Inject 1,400 Units into the vein 3 (three) times a week.   metoprolol succinate 50 MG 24 hr tablet Commonly known as: TOPROL-XL Take 1 tablet (50 mg total) by mouth once daily. Take with or immediately following a meal.   Rightest GL300 Lancets Misc USE AS DIRECTED UP TO FOUR TIMES DAILY.   Rightest GM550 Blood Glucose w/Device Kit USE AS DIRECTED UP TO FOUR TIMES DAILY.   Rightest GS550 Blood Glucose test strip Generic drug: glucose blood USE AS DIRECTED UP TO FOUR TIMES DAILY.   VENOFER IV Inject 100 mg into the vein 3 (three) times a week.         Follow-up Information     Your PCP Follow up in 1 week(s).                  Allergies  Allergen Reactions   Statins     Other reaction(s): Other (See Comments) "redness and facial swelling"   Cholesterol Rash    Cholesterol medicine     The results of significant diagnostics from this hospitalization (including imaging, microbiology, ancillary and laboratory) are listed below for reference.   Consultations:   Procedures/Studies: NM Pulmonary Perfusion  Result Date: 01/04/2021 CLINICAL DATA:  Respiratory failure, COVID, asthma, rule out PE EXAM: NUCLEAR MEDICINE PERFUSION LUNG SCAN TECHNIQUE: Perfusion images were obtained in multiple projections after intravenous injection of radiopharmaceutical. Ventilation scans intentionally deferred if perfusion scan  and chest x-ray adequate for interpretation during COVID 19 epidemic. RADIOPHARMACEUTICALS:  4.64 mCi Tc-5mMAA IV COMPARISON:  Chest radiograph, 01/03/2021 FINDINGS: Normal, homogeneous perfusion of the lungs. No suspicious perfusion defect. Cardiomegaly. IMPRESSION: 1. Very low probability for pulmonary embolism by modified perfusion only PIOPED criteria (PE absent). 2.  Cardiomegaly. Electronically Signed   By: AEddie CandleM.D.   On: 01/04/2021 11:53   DG Chest Portable 1 View  Result Date: 01/03/2021 CLINICAL DATA:  Shortness of breath. EXAM: PORTABLE CHEST 1 VIEW COMPARISON:  12/04/2020 FINDINGS: Patient has RIGHT-sided dialysis catheter, tips overlying the superior vena cava. The heart is enlarged. There are pulmonary opacities bilaterally, consistent with edema. No consolidations. No pleural effusions. IMPRESSION: Cardiomegaly and pulmonary edema. Electronically Signed   By: ENolon NationsM.D.   On: 01/03/2021 20:33      Labs: BNP (last 3 results) Recent Labs  01/03/21 2159  BNP 458.0*   Basic Metabolic Panel: Recent Labs  Lab 01/03/21 2011 01/04/21 0652 01/05/21 0647  NA 135 137 134*  K 5.1 4.3 4.3  CL 103 101 96*  CO2 _0 GLUCOSE 212* 121* 165*  BUN 39* 26* 24*  CREATININE 7.73* 5.88* 5.78*  CALCIUM 8.6* 8.6* 8.2*  MG  --   --  1.9   Liver Function Tests: Recent Labs  Lab 01/03/21 2011  AST 16  ALT 11  ALKPHOS 101  BILITOT 0.5  PROT 6.7  ALBUMIN 3.4*   No results for input(s): LIPASE, AMYLASE in the last 168 hours. No results for input(s): AMMONIA in the last 168 hours. CBC: Recent Labs  Lab 01/03/21 2011 01/05/21 0647  WBC 8.9 7.1  HGB 10.1* 9.8*  HCT 30.8* 28.8*  MCV 90.9 88.3  PLT 284 270   Cardiac Enzymes: No results for input(s): CKTOTAL, CKMB, CKMBINDEX, TROPONINI in the last 168 hours. BNP: Invalid input(s): POCBNP CBG: Recent Labs  Lab 01/04/21 0716 01/04/21 1121 01/04/21 2317 01/05/21 0805  GLUCAP 120* 209* 156* 158*    D-Dimer No results for input(s): DDIMER in the last 72 hours. Hgb A1c No results for input(s): HGBA1C in the last 72 hours. Lipid Profile No results for input(s): CHOL, HDL, LDLCALC, TRIG, CHOLHDL, LDLDIRECT in the last 72 hours. Thyroid function studies No results for input(s): TSH, T4TOTAL, T3FREE, THYROIDAB in the last 72 hours.  Invalid input(s): FREET3 Anemia work up No results for input(s): VITAMINB12, FOLATE, FERRITIN, TIBC, IRON, RETICCTPCT in the last 72 hours. Urinalysis    Component Value Date/Time   COLORURINE YELLOW (A) 12/05/2020 1030   APPEARANCEUR CLOUDY (A) 12/05/2020 1030   LABSPEC 1.023 12/05/2020 1030   PHURINE 8.0 12/05/2020 1030   GLUCOSEU >=500 (A) 12/05/2020 1030   HGBUR NEGATIVE 12/05/2020 1030   BILIRUBINUR NEGATIVE 12/05/2020 1030   KETONESUR NEGATIVE 12/05/2020 1030   PROTEINUR >=300 (A) 12/05/2020 1030   NITRITE NEGATIVE 12/05/2020 1030   LEUKOCYTESUR NEGATIVE 12/05/2020 1030   Sepsis Labs Invalid input(s): PROCALCITONIN,  WBC,  LACTICIDVEN Microbiology Recent Results (from the past 240 hour(s))  Resp Panel by RT-PCR (Flu A&B, Covid) Nasopharyngeal Swab     Status: Abnormal   Collection Time: 01/03/21  9:59 PM   Specimen: Nasopharyngeal Swab; Nasopharyngeal(NP) swabs in vial transport medium  Result Value Ref Range Status   SARS Coronavirus 2 by RT PCR POSITIVE (A) NEGATIVE Final    Comment: RESULT CALLED TO, READ BACK BY AND VERIFIED WITH: CALEB BURGESS _1  01/04/21 LFD (NOTE) SARS-CoV-2 target nucleic acids are DETECTED.  The SARS-CoV-2 RNA is generally detectable in upper respiratory specimens during the acute phase of infection. Positive results are indicative of the presence of the identified virus, but do not rule out bacterial infection or co-infection with other pathogens not detected by the test. Clinical correlation with patient history and other diagnostic information is necessary to determine patient infection status. The  expected result is Negative.  Fact Sheet for Patients: EntrepreneurPulse.com.au  Fact Sheet for Healthcare Providers: IncredibleEmployment.be  This test is not yet approved or cleared by the [place] FDA and  has been authorized for detection and/or diagnosis of SARS-CoV-2 by FDA under an Emergency Use Authorization (EUA).  This EUA will remain in effect (meaning this test can be  used) for the duration of  the COVID-19 declaration under Section 564(b)(1) of the Act, 21 U.S.C. section 360bbb-3(b)(1), unless the authorization is terminated or revoked sooner.  Influenza A by PCR NEGATIVE NEGATIVE Final   Influenza B by PCR NEGATIVE NEGATIVE Final    Comment: (NOTE) The Xpert Xpress SARS-CoV-2/FLU/RSV plus assay is intended as an aid in the diagnosis of influenza from Nasopharyngeal swab specimens and should not be used as a sole basis for treatment. Nasal washings and aspirates are unacceptable for Xpert Xpress SARS-CoV-2/FLU/RSV testing.  Fact Sheet for Patients: EntrepreneurPulse.com.au  Fact Sheet for Healthcare Providers: IncredibleEmployment.be  This test is not yet approved or cleared by the [place] FDA and has been authorized for detection and/or diagnosis of SARS-CoV-2 by FDA under an Emergency Use Authorization (EUA). This EUA will remain in effect (meaning this test can be used) for the duration of the COVID-19 declaration under Section 564(b)(1) of the Act, 21 U.S.C. section 360bbb-3(b)(1), unless the authorization is terminated or revoked.  Performed at [facility], [address] [place], [place] [address]      Total time spend on discharging this patient, including the last patient exam, discussing the hospital stay, instructions for ongoing care as it relates to all pertinent caregivers, as well as preparing the medical discharge records, prescriptions, and/or  referrals as applicable, is 35 minutes.    Enzo Bi, MD  [facility] 01/05/2021, 10:01 AM

## 2021-01-05 NOTE — Progress Notes (Signed)
[facility]  ROUNDING NOTE   Subjective:   [name] was admitted to [facility] on 01/03/2021 for Shortness of breath [R06.02] Acute pulmonary edema ([place]) [J81.0] Acute respiratory failure with hypoxia ([place]) [J96.01] Bilateral lower extremity edema [R60.0]  Emergent hemodialysis treatment  in ED on 01/03/21 due to hypertensive urgency and pulmonary edema.  UF of 2.5 liters.  Dialysis yesterday for continued shortness of breath with UF 1982 ml removed.   Patient feels more comfortable today. Continues to complains of some shortness of breath, but states it is much improved. Tolerating meals. Feels she is ready for discharge   Objective:  Vital signs in last 24 hours:  Temp:  [98 F (36.7 C)-98.8 F (37.1 C)] 98.6 F (37 C) (08/23 0804) Pulse Rate:  [67-79] 72 (08/23 0804) Resp:  [11-22] 18 (08/23 0804) BP: (140-196)/(72-107) 162/88 (08/23 0804) SpO2:  [94 %-98 %] 98 % (08/23 0804) Weight:  [119.3 kg-121.3 kg] 121.3 kg (08/23 0500)  Weight change:  Filed Weights   01/04/21 2335 01/05/21 0500  Weight: 119.3 kg 121.3 kg    Intake/Output: I/O last 3 completed shifts: In: -  Out: 4523 [id number]   Intake/Output this shift:  No intake/output data recorded.  Physical Exam: General: Sitting up in bed, NAD  Head: Normocephalic, atraumatic. Moist oral mucosal membranes  Eyes: Anicteric  Lungs:  Bilateral fine crackles at bases, breathing room air  Heart: Regular rate and rhythm  Abdomen:  Soft, nontender, obese  Extremities:  No peripheral edema.  Neurologic: Nonfocal, moving all four extremities  Skin: No lesions  Access: RIJ permcath, left AVF maturing    Basic Metabolic Panel: Recent Labs  Lab 01/03/21 2011 01/04/21 0652 01/05/21 0647  NA 135 137 134*  K 5.1 4.3 4.3  CL 103 101 96*  CO2 '24 28 29  '$ GLUCOSE 212* 121* 165*  BUN 39* 26* 24*  CREATININE 7.73* 5.88* 5.78*  CALCIUM 8.6* 8.6* 8.2*  MG  --   --  1.9     Liver Function  Tests: Recent Labs  Lab 01/03/21 2011  AST 16  ALT 11  ALKPHOS 101  BILITOT 0.5  PROT 6.7  ALBUMIN 3.4*    No results for input(s): LIPASE, AMYLASE in the last 168 hours. No results for input(s): AMMONIA in the last 168 hours.  CBC: Recent Labs  Lab 01/03/21 2011 01/05/21 0647  WBC 8.9 7.1  HGB 10.1* 9.8*  HCT 30.8* 28.8*  MCV 90.9 88.3  PLT 284 270     Cardiac Enzymes: No results for input(s): CKTOTAL, CKMB, CKMBINDEX, TROPONINI in the last 168 hours.  BNP: Invalid input(s): POCBNP  CBG: Recent Labs  Lab 01/04/21 0716 01/04/21 1121 01/04/21 2317 01/05/21 0805 01/05/21 1215  GLUCAP 120* 209* 156* 158* 173*     Microbiology: Results for orders placed or performed during the hospital encounter of 01/03/21  Resp Panel by RT-PCR (Flu A&B, Covid) Nasopharyngeal Swab     Status: Abnormal   Collection Time: 01/03/21  9:59 PM   Specimen: Nasopharyngeal Swab; Nasopharyngeal(NP) swabs in vial transport medium  Result Value Ref Range Status   SARS Coronavirus 2 by RT PCR POSITIVE (A) NEGATIVE Final    Comment: RESULT CALLED TO, READ BACK BY AND VERIFIED WITH: CALEB BURGESS '@0031'$  01/04/21 LFD (NOTE) SARS-CoV-2 target nucleic acids are DETECTED.  The SARS-CoV-2 RNA is generally detectable in upper respiratory specimens during the acute phase of infection. Positive results are indicative of the presence of the identified virus, but do  not rule out bacterial infection or co-infection with other pathogens not detected by the test. Clinical correlation with patient history and other diagnostic information is necessary to determine patient infection status. The expected result is Negative.  Fact Sheet for Patients: EntrepreneurPulse.com.au  Fact Sheet for Healthcare Providers: IncredibleEmployment.be  This test is not yet approved or cleared by the [place] FDA and  has been authorized for detection and/or diagnosis of  SARS-CoV-2 by FDA under an Emergency Use Authorization (EUA).  This EUA will remain in effect (meaning this test can be  used) for the duration of  the COVID-19 declaration under Section 564(b)(1) of the Act, 21 U.S.C. section 360bbb-3(b)(1), unless the authorization is terminated or revoked sooner.     Influenza A by PCR NEGATIVE NEGATIVE Final   Influenza B by PCR NEGATIVE NEGATIVE Final    Comment: (NOTE) The Xpert Xpress SARS-CoV-2/FLU/RSV plus assay is intended as an aid in the diagnosis of influenza from Nasopharyngeal swab specimens and should not be used as a sole basis for treatment. Nasal washings and aspirates are unacceptable for Xpert Xpress SARS-CoV-2/FLU/RSV testing.  Fact Sheet for Patients: EntrepreneurPulse.com.au  Fact Sheet for Healthcare Providers: IncredibleEmployment.be  This test is not yet approved or cleared by the [place] FDA and has been authorized for detection and/or diagnosis of SARS-CoV-2 by FDA under an Emergency Use Authorization (EUA). This EUA will remain in effect (meaning this test can be used) for the duration of the COVID-19 declaration under Section 564(b)(1) of the Act, 21 U.S.C. section 360bbb-3(b)(1), unless the authorization is terminated or revoked.  Performed at [facility], [address] [place], [place] [address]     Coagulation Studies: No results for input(s): LABPROT, INR in the last 72 hours.  Urinalysis: No results for input(s): COLORURINE, LABSPEC, PHURINE, GLUCOSEU, HGBUR, BILIRUBINUR, KETONESUR, PROTEINUR, UROBILINOGEN, NITRITE, LEUKOCYTESUR in the last 72 hours.  Invalid input(s): APPERANCEUR    Imaging: NM Pulmonary Perfusion  Result Date: 01/04/2021 CLINICAL DATA:  Respiratory failure, COVID, asthma, rule out PE EXAM: NUCLEAR MEDICINE PERFUSION LUNG SCAN TECHNIQUE: Perfusion images were obtained in multiple projections after intravenous injection of  radiopharmaceutical. Ventilation scans intentionally deferred if perfusion scan and chest x-ray adequate for interpretation during COVID 19 epidemic. RADIOPHARMACEUTICALS:  4.64 mCi Tc-48mMAA IV COMPARISON:  Chest radiograph, 01/03/2021 FINDINGS: Normal, homogeneous perfusion of the lungs. No suspicious perfusion defect. Cardiomegaly. IMPRESSION: 1. Very low probability for pulmonary embolism by modified perfusion only PIOPED criteria (PE absent). 2.  Cardiomegaly. Electronically Signed   By: AEddie CandleM.D.   On: 01/04/2021 11:53   DG Chest Portable 1 View  Result Date: 01/03/2021 CLINICAL DATA:  Shortness of breath. EXAM: PORTABLE CHEST 1 VIEW COMPARISON:  12/04/2020 FINDINGS: Patient has RIGHT-sided dialysis catheter, tips overlying the superior vena cava. The heart is enlarged. There are pulmonary opacities bilaterally, consistent with edema. No consolidations. No pleural effusions. IMPRESSION: Cardiomegaly and pulmonary edema. Electronically Signed   By: ENolon NationsM.D.   On: 01/03/2021 20:33     Medications:    Current Facility-Administered Medications:    0.9 %  sodium chloride infusion, 250 mL, Intravenous, PRN, PPara Skeans MD   acetaminophen (TYLENOL) tablet 500-1,000 mg, 500-1,000 mg, Oral, Q6H PRN, PFlorina OuV, MD, 1,000 mg at 01/05/21 0612   amLODipine (NORVASC) tablet 10 mg, 10 mg, Oral, Daily, LEnzo Bi MD, 10 mg at 01/05/21 0827   calcium carbonate (TUMS - dosed in mg elemental calcium) chewable tablet 200 mg of elemental  calcium, 1 tablet, Oral, TID WC, Patel, Ekta V, MD, 200 mg of elemental calcium at 01/05/21 1222   Chlorhexidine Gluconate Cloth 2 % PADS 6 each, 6 each, Topical, Q0600, Kolluru, Sarath, MD, 6 each at 01/05/21 0615   diphenhydrAMINE (BENADRYL) capsule 25 mg, 25 mg, Oral, Q6H PRN, Enzo Bi, MD, 25 mg at 01/05/21 0841   epoetin alfa (EPOGEN) injection 4,000 Units, 4,000 Units, Intravenous, Q M,W,F-HD, Kolluru, Sarath, MD   heparin injection 5,000  Units, 5,000 Units, Subcutaneous, Q8H, Florina Ou V, MD, 5,000 Units at 01/05/21 [phone]   hydrALAZINE (APRESOLINE) injection 10 mg, 10 mg, Intravenous, Q6H PRN, Enzo Bi, MD, 10 mg at 01/05/21 0010   insulin aspart (novoLOG) injection 0-6 Units, 0-6 Units, Subcutaneous, TID WC, Para Skeans, MD, 1 Units at 01/05/21 1228   insulin glargine-yfgn (SEMGLEE) injection 14 Units, 14 Units, Subcutaneous, QHS, Enzo Bi, MD, 14 Units at 01/05/21 0008   LORazepam (ATIVAN) tablet 0.5 mg, 0.5 mg, Oral, BID PRN, Florina Ou V, MD, 0.5 mg at 01/05/21 0007   metoprolol succinate (TOPROL-XL) 24 hr tablet 50 mg, 50 mg, Oral, Daily, Florina Ou V, MD, 50 mg at 01/05/21 0828   ondansetron [facility]) injection 4 mg, 4 mg, Intravenous, Q6H PRN, Florina Ou V, MD, 4 mg at 01/04/21 2321   sodium chloride flush (NS) 0.9 % injection 3 mL, 3 mL, Intravenous, Q12H, Florina Ou V, MD, 3 mL at 01/05/21 0930   sodium chloride flush (NS) 0.9 % injection 3 mL, 3 mL, Intravenous, PRN, Para Skeans, MD    Assessment/ Plan:  [name] is a 45 y.o. Hispanic female with end stage renal disease on hemodialysis, diabetes mellitus type II, asthma, hypertension, depression, GERD, who is admitted to [facility] on 01/03/2021 for Shortness of breath [R06.02] Acute pulmonary edema ([place]) [J81.0] Acute respiratory failure with hypoxia ([place]) [J96.01] Bilateral lower extremity edema [R60.0]  [facility] M[facility] [place] 120kg  End Stage Renal Disease: patient with pulmonary edema requiring oxygen and hypertensive urgency on admission.  - Patient requested dialysis yesterday, UF goal 191m removed - Next treatment scheduled for Wednesday - Needs outpatient dry weight adjusted.  - Patient cleared to discharge from renal stance and continue outpatient treatments clinic. Notified clinic of Covid status and will make the necessary arrangements. Patient notified to stay in vehicle on arrival and call center, they will escort  her in when they're ready. Per clinic, she will remain on same schedule and time.   Hypertension: with urgency on admission . Elevated this am 162/88. Continue home regimen of metoprolol and amlodipine.   Anemia of chronic kidney disease: Hemoglobin 9.8 - at goal. Normocytic.  - EPO with HD treatment today.   Secondary Hyperparathyroidism: outpatient labs on 8/8 with phos 5.9, calcium 7.8, PTH 897. Patient to take calcium carbonate with meals.   Diabetes mellitus type II with chronic kidney disease: insulin dependent. History of poor control. Hemoglobin A1c of  11% on 11/10/2020. Glucose stable   LOS: 2 Leeroy Lovings 8/23/202212:45 PM

## 2021-01-07 ENCOUNTER — Ambulatory Visit: Payer: Self-pay | Admitting: Licensed Clinical Social Worker

## 2021-01-07 ENCOUNTER — Other Ambulatory Visit: Payer: Self-pay

## 2021-01-07 DIAGNOSIS — F411 Generalized anxiety disorder: Secondary | ICD-10-CM

## 2021-01-07 DIAGNOSIS — F410 Panic disorder [episodic paroxysmal anxiety] without agoraphobia: Secondary | ICD-10-CM

## 2021-01-07 DIAGNOSIS — F339 Major depressive disorder, recurrent, unspecified: Secondary | ICD-10-CM

## 2021-01-07 NOTE — BH Specialist Note (Signed)
Integrated Behavioral Health Follow Up Telephone Visit  MRN: [id number] Name: [name]   Total time: 60 minutes  Types of Service: Telephone visit Patient consents to telephone visit and 2 patient identifiers were used to identify patient   Interpretor:No. Interpretor Name and Language: N/A  Subjective: [name] is a 45 y.o. female accompanied by  herself Patient was referred by Merrilyn Puma for mental health. Patient reports the following symptoms/concerns: [name] reports that she has not been doing well since her last follow-up appointment. She explained that she has been sick and had to be in the hospital. She shared that they thought she had something wrong with her lungs but she just had COVID. She shared that the dialysis center was trying to kill her so she called and complained and they set her up an appointment at a different location. However, she explained that she had to go one last time and they threw her a party at dialysis to say they were sorry and begged her not to tell on them. [name] stated, " There was this man John who worked there and he kept telling me he was going to kill me." [name] shared that she has been seeing things no one else can see. She stated, " I don't care I'm not crazy I know I see the most beautiful light ball floating across the room at about two in the morning and I do not know what it is but it is beautiful." She shared that she has not heard the voices telling her to kill herself in about a week. [name] stated she did not want to kill herself. She shared that she had no plan to end her life, no access to means to carry out a plan, and has no firearms in the home. She explained that she would not end her life because she feels suicide is wrong, and she does not want to cause her family more pain. [name] stated that if the voices come back or her symptoms worsen she will tell her adult children, dial 911, or visit the nearest emergency  room. [name] stated that she understood the recommendation to walk in to [facility] to establish care and would do that this week.  Duration of problem: Years; Severity of problem: moderate  Objective: Mood: Depressed and Affect: Appropriate Risk of harm to self or others: No plan to harm self or others  Life Context: Family and Social: see above School/Work: see above Self-Care: see above Life Changes: see above  Patient and/or Family's Strengths/Protective Factors: Concrete supports in place (healthy food, safe environments, etc.)  Goals Addressed: Patient will:  Reduce symptoms of: anxiety, depression, and mood instability   Increase knowledge and/or ability of: coping skills, healthy habits, self-management skills, and stress reduction   Demonstrate ability to: Increase healthy adjustment to current life circumstances and Increase adequate support systems for patient/family  Progress towards Goals: Other  Interventions: Interventions utilized:  Supportive Counseling and Link to [employer] utilized by the clinician during today's follow up session. The clinician processed with the patient how they have been doing since the last follow-up session. The clinician provided a space for the patient to ventilate their frustrations regarding their current life circumstances. Clinician completed a risk assessment to determine level of severity when it comes to patient's suicidal thoughts. Clinician determined that the patient is competent, thinking clearly, orientated x 3, able to make own medical decisions, and is not acutely suicidal but if she were, she would access emergency  crisis services. Clinician mailed the patient a letter including the contact information for crisis mental health services and a [facility] information sheet with instructions and location to walk in to establish care.      Standardized Assessments completed: Patient declined screening  Assessment: Patient currently  experiencing see above.   Patient may benefit from see above.  Plan: Follow up with behavioral health clinician on :  Behavioral recommendations: Follow-Up with [facility] Referral(s): Community Mental Health Services (LME/Outside Clinic) "From scale of 1-10, how likely are you to follow plan?":   Lesli Albee, LCSWA

## 2021-01-14 ENCOUNTER — Ambulatory Visit: Payer: Self-pay | Admitting: Licensed Clinical Social Worker

## 2021-01-14 ENCOUNTER — Telehealth: Payer: Self-pay | Admitting: Licensed Clinical Social Worker

## 2021-01-14 NOTE — Telephone Encounter (Signed)
Called the patient twice during today's scheduled appointment; no answer, left a voicemail with the clinic contact information so they may reschedule.   

## 2021-01-21 ENCOUNTER — Ambulatory Visit (INDEPENDENT_AMBULATORY_CARE_PROVIDER_SITE_OTHER): Payer: Self-pay

## 2021-01-21 ENCOUNTER — Encounter (INDEPENDENT_AMBULATORY_CARE_PROVIDER_SITE_OTHER): Payer: Self-pay | Admitting: Nurse Practitioner

## 2021-01-21 ENCOUNTER — Ambulatory Visit (INDEPENDENT_AMBULATORY_CARE_PROVIDER_SITE_OTHER): Payer: Self-pay | Admitting: Nurse Practitioner

## 2021-01-21 ENCOUNTER — Other Ambulatory Visit: Payer: Self-pay

## 2021-01-21 VITALS — BP 169/94 | HR 80 | Ht 60.0 in | Wt 247.0 lb

## 2021-01-21 DIAGNOSIS — Z992 Dependence on renal dialysis: Secondary | ICD-10-CM

## 2021-01-21 DIAGNOSIS — E1122 Type 2 diabetes mellitus with diabetic chronic kidney disease: Secondary | ICD-10-CM

## 2021-01-21 DIAGNOSIS — N186 End stage renal disease: Secondary | ICD-10-CM

## 2021-01-21 DIAGNOSIS — I10 Essential (primary) hypertension: Secondary | ICD-10-CM

## 2021-01-21 NOTE — Progress Notes (Signed)
Subjective:    Patient ID: [name], female    DOB: January 19, 1976, 45 y.o.   MRN: [id number] Chief Complaint  Patient presents with   Follow-up    3 wk [place]    [name] is a 45 year old female that presents today for follow-up evaluation of her left brachiocephalic AV fistula.  The incision site is well-healed.  She denies any pain or swelling of the upper extremity.  The patient has a good thrill and bruit however it is noted to be somewhat deep.  Currently the patient is maintained via PermCath.  She denies any fevers or chills.  She denies any chest pain or shortness of breath.  Today the flow volume is 1686 no areas of significant stenosis.   Review of Systems  All other systems reviewed and are negative.     Objective:   Physical Exam Vitals reviewed.  HENT:     Head: Normocephalic.  Cardiovascular:     Rate and Rhythm: Normal rate.     Pulses: Normal pulses.          Radial pulses are 2+ on the left side.     Arteriovenous access: Left arteriovenous access is present.    Comments: Good thrill and bruit in left brachiocephalic AV fistula Pulmonary:     Effort: Pulmonary effort is normal.  Neurological:     Mental Status: She is alert and oriented to person, place, and time.  Psychiatric:        Mood and Affect: Mood normal.        Behavior: Behavior normal.        Thought Content: Thought content normal.        Judgment: Judgment normal.    BP (!) 169/94   Pulse 80   Ht 5' (1.524 m)   Wt 247 lb (112 kg)   BMI 48.24 kg/m   Past Medical History:  Diagnosis Date   Anemia    Asthma    well controlled   Complication of anesthesia    "takes alot for anesthesia to work"   Concussion    h/o   Depression    Diabetes ([place])    last A1c was 5.8 on 09-04-20   Dialysis patient ([place])    T, Th, and Sat   Family history of adverse reaction to anesthesia    son-became aggressive after surgery   GERD (gastroesophageal reflux disease)    occ no meds    Headache    migraines   Hypertension    Palpitations    Pneumonia    h/o   Renal disorder     Social History   Socioeconomic History   Marital status: Married    Spouse name: Not on file   Number of children: Not on file   Years of education: Not on file   Highest education level: Not on file  Occupational History   Not on file  Tobacco Use   Smoking status: Never   Smokeless tobacco: Never  Vaping Use   Vaping Use: Never used  Substance and Sexual Activity   Alcohol use: Never   Drug use: Never   Sexual activity: Not on file  Other Topics Concern   Not on file  Social History Narrative   Not on file   Social Determinants of Health   Financial Resource Strain: Not on file  Food Insecurity: Food Insecurity Present   Worried About [place] in the Last Year: Often true   Ran  Out of Food in the Last Year: Often true  Transportation Needs: Unmet Transportation Needs   Lack of Transportation (Medical): Yes   Lack of Transportation (Non-Medical): Yes  Physical Activity: Not on file  Stress: Not on file  Social Connections: Not on file  Intimate Partner Violence: Not on file    Past Surgical History:  Procedure Laterality Date   AV FISTULA PLACEMENT Left 11/20/2020   Procedure: ARTERIOVENOUS (AV) FISTULA CREATION ( BRACHIAL CEPHALIC );  Surgeon: Katha Cabal, MD;  Location: [facility] ORS;  Service: Vascular;  Laterality: Left;   CESAREAN SECTION N/A    x3   CHOLECYSTECTOMY     DIALYSIS/PERMA CATHETER INSERTION N/A 08/24/2020   Procedure: DIALYSIS/PERMA CATHETER INSERTION;  Surgeon: Algernon Huxley, MD;  Location: [facility];  Service: Cardiovascular;  Laterality: N/A;    Family History  Problem Relation Age of Onset   Diabetes Mother    Skin cancer Mother    Heart attack Father        died at ~28 years of age   Alcohol abuse Brother    Diabetes Brother     Allergies  Allergen Reactions   Statins     Other reaction(s): Other (See  Comments) "redness and facial swelling"   Cholesterol Rash    Cholesterol medicine    CBC Latest Ref Rng & Units 01/05/2021 01/03/2021 12/07/2020  WBC 4.0 - 10.5 K/uL 7.1 8.9 5.2  Hemoglobin 12.0 - 15.0 g/dL 9.8(L) 10.1(L) 9.7(L)  Hematocrit 36.0 - 46.0 % 28.8(L) 30.8(L) 28.0(L)  Platelets 150 - 400 K/uL 270 284 228      CMP     Component Value Date/Time   NA 134 (L) 01/05/2021 0647   K 4.3 01/05/2021 0647   CL 96 (L) 01/05/2021 0647   CO2 29 01/05/2021 0647   GLUCOSE 165 (H) 01/05/2021 0647   BUN 24 (H) 01/05/2021 0647   CREATININE 5.78 (H) 01/05/2021 0647   CALCIUM 8.2 (L) 01/05/2021 0647   PROT 6.7 01/03/2021 2011   ALBUMIN 3.4 (L) 01/03/2021 2011   AST 16 01/03/2021 2011   ALT 11 01/03/2021 2011   ALKPHOS 101 01/03/2021 2011   BILITOT 0.5 01/03/2021 2011   GFRNONAA 9 (L) 01/05/2021 0647   GFRAA 17 (L) 12/13/2019 0514     No results found.     Assessment & Plan:   1. End stage renal disease on dialysis [facility]) Based on noninvasive studies today the patient currently has adequate access for dialysis.  We can begin cannulating her fistula as of 01/21/2021.  I discussed with the patient that her fistula is on the deeper side and sometimes this causes issues with dialysis technicians.  If the technicians have continued issues with cannulation we may have to discuss superficialization of her fistula.  If there are no issues while the patient follow-up in 6 months with noninvasive studies.  2. Type 2 diabetes mellitus with chronic kidney disease on chronic dialysis, unspecified whether long term insulin use ([place]) Continue hypoglycemic medications as already ordered, these medications have been reviewed and there are no changes at this time.  Hgb A1C to be monitored as already arranged by primary service   3. Essential hypertension Continue antihypertensive medications as already ordered, these medications have been reviewed and there are no changes at this time.    Current  Outpatient Medications on File Prior to Visit  Medication Sig Dispense Refill   acetaminophen (TYLENOL) 500 MG tablet Take 500-1,000 mg by mouth every  6 (six) hours as needed (pain).     amLODipine (NORVASC) 10 MG tablet Take 1 tablet (10 mg total) by mouth once daily. 30 tablet 2   Blood Glucose Monitoring Suppl (RIGHTEST GM550 BLOOD GLUCOSE) w/Device KIT USE AS DIRECTED UP TO FOUR TIMES DAILY. 1 kit 0   Blood Pressure KIT USE AS DIRECTED. 1 kit 0   CALCITRIOL IV Inject 2 mcg into the vein 3 (three) times a week.     calcium acetate (PHOSLO) 667 MG tablet Take 1,334 mg by mouth 3 (three) times daily.     calcium carbonate (TUMS - DOSED IN MG ELEMENTAL CALCIUM) 500 MG chewable tablet Chew 1 tablet (200 mg of elemental calcium total) by mouth 3 (three) times daily with meals. 90 tablet 1   diphenhydrAMINE (BENADRYL) 25 MG tablet Take 25 mg by mouth every 6 (six) hours as needed. Taking as needed     epoetin alfa (EPOGEN) 10000 UNIT/ML injection Inject 1,400 Units into the vein 3 (three) times a week.     glucose blood (RIGHTEST GS550 BLOOD GLUCOSE) test strip USE AS DIRECTED UP TO FOUR TIMES DAILY. 100 strip 0   Insulin Glargine (BASAGLAR KWIKPEN) 100 UNIT/ML Inject 14 Units into the skin once daily at bedtime. 15 mL 3   Insulin Pen Needle 32G X 4 MM MISC USE AS DIRECTED ONCE DAILY AT BEDTIME. 100 each 2   Iron Sucrose (VENOFER IV) Inject 100 mg into the vein 3 (three) times a week.     metoprolol succinate (TOPROL-XL) 50 MG 24 hr tablet Take 1 tablet (50 mg total) by mouth once daily. Take with or immediately following a meal. 30 tablet 3   Rightest GL300 Lancets MISC USE AS DIRECTED UP TO FOUR TIMES DAILY. 100 each 0   No current facility-administered medications on file prior to visit.    There are no Patient Instructions on file for this visit. No follow-ups on file.   Kris Hartmann, NP

## 2021-01-22 ENCOUNTER — Other Ambulatory Visit: Payer: Self-pay

## 2021-01-22 MED ORDER — LIDOCAINE-PRILOCAINE 2.5-2.5 % EX CREA
TOPICAL_CREAM | CUTANEOUS | 11 refills | Status: DC
  Filled 2021-01-22: qty 30, 30d supply, fill #0

## 2021-01-25 ENCOUNTER — Other Ambulatory Visit: Payer: Self-pay

## 2021-01-26 ENCOUNTER — Other Ambulatory Visit: Payer: Self-pay

## 2021-02-04 ENCOUNTER — Other Ambulatory Visit: Payer: Self-pay

## 2021-02-04 ENCOUNTER — Encounter: Payer: Self-pay | Admitting: Gerontology

## 2021-02-04 ENCOUNTER — Ambulatory Visit: Payer: Self-pay | Admitting: Gerontology

## 2021-02-04 VITALS — BP 155/84 | HR 67 | Temp 98.4°F | Resp 18 | Ht 61.0 in | Wt 236.1 lb

## 2021-02-04 DIAGNOSIS — N186 End stage renal disease: Secondary | ICD-10-CM

## 2021-02-04 DIAGNOSIS — Z992 Dependence on renal dialysis: Secondary | ICD-10-CM

## 2021-02-04 DIAGNOSIS — E1122 Type 2 diabetes mellitus with diabetic chronic kidney disease: Secondary | ICD-10-CM

## 2021-02-04 DIAGNOSIS — R350 Frequency of micturition: Secondary | ICD-10-CM

## 2021-02-04 DIAGNOSIS — I10 Essential (primary) hypertension: Secondary | ICD-10-CM

## 2021-02-04 LAB — GLUCOSE, POCT (MANUAL RESULT ENTRY): POC Glucose: 224 mg/dl — AB (ref 70–99)

## 2021-02-04 MED ORDER — LIDOCAINE-PRILOCAINE 2.5-2.5 % EX CREA
1.0000 "application " | TOPICAL_CREAM | CUTANEOUS | 4 refills | Status: DC | PRN
  Filled 2021-02-04 – 2021-03-31 (×2): qty 30, 30d supply, fill #0
  Filled 2021-07-12: qty 30, 30d supply, fill #1

## 2021-02-04 NOTE — Progress Notes (Signed)
Established Patient Office Visit  Subjective:  Patient ID: [name], female    DOB: [birth year]  Age: 45 y.o. MRN: [id number]  CC:  Chief Complaint  Patient presents with   Follow-up   Back Pain   Urinary Frequency    Patient c/o urinary frequency and urgency x 1 month    HPI [name] is a 45 y/o female who history of Asthma,T2 Diabetes, Hypertension, Renal disorder on hemodialysis, presents for routine follow-up.  She states that she is compliant with her medication and continues to make healthy lifestyle changes.  She does not check her blood pressure at home but admits to adhering to DASH diet.  She denies chest pain, palpitation, dizziness, headache and vision changes. She was seen by Vascular Radiology  Rushie Nyhan NP on 01/21/21 for left AV fistula.  She has thrill and bruit present to left AC AV fistula.  She also has right chest permacath and she goes to dialysis Monday Wednesday Friday. She was discharged from the hospital on 01/05/21, and during hospital course, she was treated for pulmonary edema.  Currently, she states that she is doing well.  Her HgbA1c done on 12/05/20 was 10.8%. She checks her blood glucose bid, and she forgot her log. She denies hypoglycemic symptoms and peripheral neuropathy.  Her blood glucose checked during visit was 224 per DL. Currently, she c/o urinary frequency, urgency, that started 1 month ago. She denies dysuria, flank or pelvic pain. Overall, she states that she is doing well and offers no further complaint.  Past Medical History:  Diagnosis Date   Anemia    Asthma    well controlled   Complication of anesthesia    "takes alot for anesthesia to work"   Concussion    h/o   Depression    Diabetes ([place])    last A1c was 5.8 on 09-04-20   Dialysis patient ([place])    T, Th, and Sat   Family history of adverse reaction to anesthesia    son-became aggressive after surgery   GERD (gastroesophageal reflux disease)    occ no meds   Headache     migraines   Hypertension    Palpitations    Pneumonia    h/o   Renal disorder     Past Surgical History:  Procedure Laterality Date   AV FISTULA PLACEMENT Left 11/20/2020   Procedure: ARTERIOVENOUS (AV) FISTULA CREATION ( BRACHIAL CEPHALIC );  Surgeon: Katha Cabal, MD;  Location: [facility];  Service: Vascular;  Laterality: Left;   CESAREAN SECTION N/A    x3   CHOLECYSTECTOMY     DIALYSIS/PERMA CATHETER INSERTION N/A 08/24/2020   Procedure: DIALYSIS/PERMA CATHETER INSERTION;  Surgeon: Algernon Huxley, MD;  Location: [facility];  Service: Cardiovascular;  Laterality: N/A;    Family History  Problem Relation Age of Onset   Diabetes Mother    Skin cancer Mother    Heart attack Father        died at ~97 years of age   Other Sister        hit by a motorcycle   Alcohol abuse Brother    Diabetes Brother    HIV Brother     Social History   Socioeconomic History   Marital status: Married    Spouse name: Not on file   Number of children: Not on file   Years of education: Not on file   Highest education level: Not on file  Occupational History  Not on file  Tobacco Use   Smoking status: Never   Smokeless tobacco: Never  Vaping Use   Vaping Use: Never used  Substance and Sexual Activity   Alcohol use: Never   Drug use: Never   Sexual activity: Not on file  Other Topics Concern   Not on file  Social History Narrative   Not on file   Social Determinants of Health   Financial Resource Strain: Not on file  Food Insecurity: Food Insecurity Present   Worried About [place] in the Last Year: Often true   Arboriculturist in the Last Year: Often true  Transportation Needs: Public librarian (Medical): Yes   Lack of Transportation (Non-Medical): Yes  Physical Activity: Not on file  Stress: Not on file  Social Connections: Not on file  Intimate Partner Violence: Not on file    Outpatient Medications Prior to  Visit  Medication Sig Dispense Refill   acetaminophen (TYLENOL) 500 MG tablet Take 500-1,000 mg by mouth every 6 (six) hours as needed (pain).     amLODipine (NORVASC) 10 MG tablet Take 1 tablet (10 mg total) by mouth once daily. 30 tablet 2   Blood Glucose Monitoring Suppl (RIGHTEST GM550 BLOOD GLUCOSE) w/Device KIT USE AS DIRECTED UP TO FOUR TIMES DAILY. 1 kit 0   Blood Pressure KIT USE AS DIRECTED. 1 kit 0   CALCITRIOL IV Inject 2 mcg into the vein 3 (three) times a week.     calcium carbonate (TUMS - DOSED IN MG ELEMENTAL CALCIUM) 500 MG chewable tablet Chew 1 tablet (200 mg of elemental calcium total) by mouth 3 (three) times daily with meals. 90 tablet 1   diphenhydrAMINE (BENADRYL) 25 MG tablet Take 25 mg by mouth every 6 (six) hours as needed. Taking as needed     epoetin alfa (EPOGEN) 10000 UNIT/ML injection Inject 1,400 Units into the vein 3 (three) times a week.     glucose blood (RIGHTEST GS550 BLOOD GLUCOSE) test strip USE AS DIRECTED UP TO FOUR TIMES DAILY. 100 strip 0   Insulin Glargine (BASAGLAR KWIKPEN) 100 UNIT/ML Inject 14 Units into the skin once daily at bedtime. 15 mL 3   Insulin Pen Needle 32G X 4 MM MISC USE AS DIRECTED ONCE DAILY AT BEDTIME. 100 each 2   Iron Sucrose (VENOFER IV) Inject 100 mg into the vein 3 (three) times a week.     metoprolol succinate (TOPROL-XL) 50 MG 24 hr tablet Take 1 tablet (50 mg total) by mouth once daily. Take with or immediately following a meal. 30 tablet 3   Rightest GL300 Lancets MISC USE AS DIRECTED UP TO FOUR TIMES DAILY. 100 each 0   calcium acetate (PHOSLO) 667 MG tablet Take 1,334 mg by mouth 3 (three) times daily. (Patient not taking: Reported on 02/04/2021)     lidocaine-prilocaine (EMLA) cream Apply topically to access site 30 minutes prior to treatment. (Patient not taking: Reported on 02/04/2021) 30 g 11   No facility-administered medications prior to visit.    Allergies  Allergen Reactions   Statins     Other reaction(s):  Other (See Comments) "redness and facial swelling"   Cholesterol Rash    Cholesterol medicine    ROS Review of Systems  Constitutional: Negative.   Respiratory: Negative.    Cardiovascular: Negative.   Genitourinary:  Positive for urgency. Negative for difficulty urinating, dysuria, flank pain and pelvic pain.  Neurological: Negative.  Objective:    Physical Exam HENT:     Head: Normocephalic and atraumatic.     Mouth/Throat:     Mouth: Mucous membranes are moist.  Eyes:     Extraocular Movements: Extraocular movements intact.     Conjunctiva/sclera: Conjunctivae normal.     Pupils: Pupils are equal, round, and reactive to light.  Cardiovascular:     Rate and Rhythm: Normal rate and regular rhythm.     Pulses: Normal pulses.     Heart sounds: Normal heart sounds.  Pulmonary:     Effort: Pulmonary effort is normal.     Breath sounds: Normal breath sounds.  Skin:    General: Skin is warm.  Neurological:     General: No focal deficit present.     Mental Status: She is alert and oriented to person, place, and time. Mental status is at baseline.  Psychiatric:        Mood and Affect: Mood normal.        Behavior: Behavior normal.        Thought Content: Thought content normal.        Judgment: Judgment normal.    BP (!) 155/84 (BP Location: Right Arm, Patient Position: Sitting, Cuff Size: Normal) Comment (BP Location): forearm  Pulse 67   Temp 98.4 F (36.9 C)   Resp 18   Ht '5\' 1"'  (1.549 m)   Wt 236 lb 1.6 oz (107.1 kg)   LMP 01/21/2021 (Approximate)   SpO2 98%   BMI 44.61 kg/m  Wt Readings from Last 3 Encounters:  02/04/21 236 lb 1.6 oz (107.1 kg)  01/21/21 247 lb (112 kg)  01/05/21 267 lb 6.7 oz (121.3 kg)   Encouraged weight loss.  Health Maintenance Due  Topic Date Due   COVID-19 Vaccine (1) Never done   OPHTHALMOLOGY EXAM  Never done   URINE MICROALBUMIN  Never done   Hepatitis C Screening  Never done   PAP SMEAR-Modifier  Never done    COLONOSCOPY (Pts 45-10yr Insurance coverage will need to be confirmed)  Never done    There are no preventive care reminders to display for this patient.  Lab Results  Component Value Date   TSH 3.921 08/29/2020   Lab Results  Component Value Date   WBC 7.1 01/05/2021   HGB 9.8 (L) 01/05/2021   HCT 28.8 (L) 01/05/2021   MCV 88.3 01/05/2021   PLT 270 01/05/2021   Lab Results  Component Value Date   NA 134 (L) 01/05/2021   K 4.3 01/05/2021   CO2 29 01/05/2021   GLUCOSE 165 (H) 01/05/2021   BUN 24 (H) 01/05/2021   CREATININE 5.78 (H) 01/05/2021   BILITOT 0.5 01/03/2021   ALKPHOS 101 01/03/2021   AST 16 01/03/2021   ALT 11 01/03/2021   PROT 6.7 01/03/2021   ALBUMIN 3.4 (L) 01/03/2021   CALCIUM 8.2 (L) 01/05/2021   ANIONGAP 9 01/05/2021   No results found for: CHOL No results found for: HDL No results found for: LDLCALC No results found for: TRIG No results found for: CHOLHDL Lab Results  Component Value Date   HGBA1C 10.8 (H) 12/05/2020      Assessment & Plan:   1. Type 2 diabetes mellitus with chronic kidney disease on chronic dialysis, without long-term current use of insulin (HCC) -Her hemoglobin A1c was 10.8%, her goal should be less than 7%.  She will continue on 14 units of glargine, was advised to check her blood glucose 3 times daily before  female, bring meter to follow-up appointment for possible addition of mealtime insulin.  She was advised that her fasting blood glucose reading goal should be between 80 and 130 mg per DL.  She was advised to continue on low carbohydrate/no concentrated sweet diet. - POCT Glucose (CBG); Future - Urine Microalbumin w/creat. ratio; Future - Lipid panel; Future - POCT Glucose (CBG) - HgB A1c; Future - Urine Microalbumin w/creat. ratio  2. Essential hypertension -Her blood pressure was elevated not at goal which should be less than 130/80.  She will continue on her current medication, monitor blood pressure daily, DASH diet  and exercise as tolerated.  3. Urinary frequency -Urine specimen was collected to check for UTI.  She was advised to perform proper perineal hygiene. - UA/M w/rflx Culture, Routine; Future - UA/M w/rflx Culture, Routine  4. Hemodialysis status ([place]) -She will continue to apply Emla cream to AV fistula site prior to dialysis. - lidocaine-prilocaine (EMLA) cream; Apply 1 application topically to the affected area(s) daily as needed.  Dispense: 30 g; Refill: 4     Follow-up: Return in about 6 weeks (around 03/16/2021), or if symptoms worsen or fail to improve.    Moussa Wiegand Jerold Coombe, NP

## 2021-02-04 NOTE — Patient Instructions (Signed)
DASH Eating Plan DASH stands for Dietary Approaches to Stop Hypertension. The DASH eating plan is a healthy eating plan that has been shown to: Reduce high blood pressure (hypertension). Reduce your risk for type 2 diabetes, heart disease, and stroke. Help with weight loss. What are tips for following this plan? Reading food labels Check food labels for the amount of salt (sodium) per serving. Choose foods with less than 5 percent of the Daily Value of sodium. Generally, foods with less than 300 milligrams (mg) of sodium per serving fit into this eating plan. To find whole grains, look for the word "whole" as the first word in the ingredient list. Shopping Buy products labeled as "low-sodium" or "no salt added." Buy fresh foods. Avoid canned foods and pre-made or frozen meals. Cooking Avoid adding salt when cooking. Use salt-free seasonings or herbs instead of table salt or sea salt. Check with your health care provider or pharmacist before using salt substitutes. Do not fry foods. Cook foods using healthy methods such as baking, boiling, grilling, roasting, and broiling instead. Cook with heart-healthy oils, such as olive, canola, avocado, soybean, or sunflower oil. Meal planning  Eat a balanced diet that includes: 4 or more servings of fruits and 4 or more servings of vegetables each day. Try to fill one-half of your plate with fruits and vegetables. 6-8 servings of whole grains each day. Less than 6 oz (170 g) of lean meat, poultry, or fish each day. A 3-oz (85-g) serving of meat is about the same size as a deck of cards. One egg equals 1 oz (28 g). 2-3 servings of low-fat dairy each day. One serving is 1 cup (237 mL). 1 serving of nuts, seeds, or beans 5 times each week. 2-3 servings of heart-healthy fats. Healthy fats called omega-3 fatty acids are found in foods such as walnuts, flaxseeds, fortified milks, and eggs. These fats are also found in cold-water fish, such as sardines, salmon,  and mackerel. Limit how much you eat of: Canned or prepackaged foods. Food that is high in trans fat, such as some fried foods. Food that is high in saturated fat, such as fatty meat. Desserts and other sweets, sugary drinks, and other foods with added sugar. Full-fat dairy products. Do not salt foods before eating. Do not eat more than 4 egg yolks a week. Try to eat at least 2 vegetarian meals a week. Eat more home-cooked food and less restaurant, buffet, and fast food. Lifestyle When eating at a restaurant, ask that your food be prepared with less salt or no salt, if possible. If you drink alcohol: Limit how much you use to: 0-1 drink a day for women who are not pregnant. 0-2 drinks a day for men. Be aware of how much alcohol is in your drink. In the [place] one drink equals one 12 oz bottle of beer (355 mL), one 5 oz glass of wine (148 mL), or one 1 oz glass of hard liquor (44 mL). General information Avoid eating more than 2,300 mg of salt a day. If you have hypertension, you may need to reduce your sodium intake to 1,500 mg a day. Work with your health care provider to maintain a healthy body weight or to lose weight. Ask what an ideal weight is for you. Get at least 30 minutes of exercise that causes your heart to beat faster (aerobic exercise) most days of the week. Activities may include walking, swimming, or biking. Work with your health care provider or dietitian to   adjust your eating plan to your individual calorie needs. What foods should I eat? Fruits All fresh, dried, or frozen fruit. Canned fruit in natural juice (without added sugar). Vegetables Fresh or frozen vegetables (raw, steamed, roasted, or grilled). Low-sodium or reduced-sodium tomato and vegetable juice. Low-sodium or reduced-sodium tomato sauce and tomato paste. Low-sodium or reduced-sodium canned vegetables. Grains Whole-grain or whole-wheat bread. Whole-grain or whole-wheat pasta. Brown rice. Oatmeal. Quinoa.  Bulgur. Whole-grain and low-sodium cereals. Pita bread. Low-fat, low-sodium crackers. Whole-wheat flour tortillas. Meats and other proteins Skinless chicken or turkey. Ground chicken or turkey. Pork with fat trimmed off. Fish and seafood. Egg whites. Dried beans, peas, or lentils. Unsalted nuts, nut butters, and seeds. Unsalted canned beans. Lean cuts of beef with fat trimmed off. Low-sodium, lean precooked or cured meat, such as sausages or meat loaves. Dairy Low-fat (1%) or fat-free (skim) milk. Reduced-fat, low-fat, or fat-free cheeses. Nonfat, low-sodium ricotta or cottage cheese. Low-fat or nonfat yogurt. Low-fat, low-sodium cheese. Fats and oils Soft margarine without trans fats. Vegetable oil. Reduced-fat, low-fat, or light mayonnaise and salad dressings (reduced-sodium). Canola, safflower, olive, avocado, soybean, and sunflower oils. Avocado. Seasonings and condiments Herbs. Spices. Seasoning mixes without salt. Other foods Unsalted popcorn and pretzels. Fat-free sweets. The items listed above may not be a complete list of foods and beverages you can eat. Contact a dietitian for more information. What foods should I avoid? Fruits Canned fruit in a light or heavy syrup. Fried fruit. Fruit in cream or butter sauce. Vegetables Creamed or fried vegetables. Vegetables in a cheese sauce. Regular canned vegetables (not low-sodium or reduced-sodium). Regular canned tomato sauce and paste (not low-sodium or reduced-sodium). Regular tomato and vegetable juice (not low-sodium or reduced-sodium). Pickles. Olives. Grains Baked goods made with fat, such as croissants, muffins, or some breads. Dry pasta or rice meal packs. Meats and other proteins Fatty cuts of meat. Ribs. Fried meat. Bacon. Bologna, salami, and other precooked or cured meats, such as sausages or meat loaves. Fat from the back of a pig (fatback). Bratwurst. Salted nuts and seeds. Canned beans with added salt. Canned or smoked fish.  Whole eggs or egg yolks. Chicken or turkey with skin. Dairy Whole or 2% milk, cream, and half-and-half. Whole or full-fat cream cheese. Whole-fat or sweetened yogurt. Full-fat cheese. Nondairy creamers. Whipped toppings. Processed cheese and cheese spreads. Fats and oils Butter. Stick margarine. Lard. Shortening. Ghee. Bacon fat. Tropical oils, such as coconut, palm kernel, or palm oil. Seasonings and condiments Onion salt, garlic salt, seasoned salt, table salt, and sea salt. Worcestershire sauce. Tartar sauce. Barbecue sauce. Teriyaki sauce. Soy sauce, including reduced-sodium. Steak sauce. Canned and packaged gravies. Fish sauce. Oyster sauce. Cocktail sauce. Store-bought horseradish. Ketchup. Mustard. Meat flavorings and tenderizers. Bouillon cubes. Hot sauces. Pre-made or packaged marinades. Pre-made or packaged taco seasonings. Relishes. Regular salad dressings. Other foods Salted popcorn and pretzels. The items listed above may not be a complete list of foods and beverages you should avoid. Contact a dietitian for more information. Where to find more information National Heart, Lung, and Blood Institute: www.nhlbi.nih.gov American Heart Association: www.heart.org Academy of Nutrition and Dietetics: www.eatright.org National Kidney Foundation: www.kidney.org Summary The DASH eating plan is a healthy eating plan that has been shown to reduce high blood pressure (hypertension). It may also reduce your risk for type 2 diabetes, heart disease, and stroke. When on the DASH eating plan, aim to eat more fresh fruits and vegetables, whole grains, lean proteins, low-fat dairy, and heart-healthy fats. With the DASH   eating plan, you should limit salt (sodium) intake to 2,300 mg a day. If you have hypertension, you may need to reduce your sodium intake to 1,500 mg a day. Work with your health care provider or dietitian to adjust your eating plan to your individual calorie needs. This information is not  intended to replace advice given to you by your health care provider. Make sure you discuss any questions you have with your health care provider. Document Revised: 04/05/2019 Document Reviewed: 04/05/2019 Elsevier Patient Education  2022 [place] for Diabetes Mellitus, Adult Carbohydrate counting is a method of keeping track of how many carbohydrates you eat. Eating carbohydrates naturally increases the amount of sugar (glucose) in the blood. Counting how many carbohydrates you eat improves your blood glucose control, which helps you manage your diabetes. It is important to know how many carbohydrates you can safely have in each meal. This is different for every person. A dietitian can help you make a meal plan and calculate how many carbohydrates you should have at each meal and snack. What foods contain carbohydrates? Carbohydrates are found in the following foods: Grains, such as breads and cereals. Dried beans and soy products. Starchy vegetables, such as potatoes, peas, and corn. Fruit and fruit juices. Milk and yogurt. Sweets and snack foods, such as cake, cookies, candy, chips, and soft drinks. How do I count carbohydrates in foods? There are two ways to count carbohydrates in food. You can read food labels or learn standard serving sizes of foods. You can use either of the methods or a combination of both. Using the Nutrition Facts label The Nutrition Facts list is included on the labels of almost all packaged foods and beverages in the [place] It includes: The serving size. Information about nutrients in each serving, including the grams (g) of carbohydrate per serving. To use the Nutrition Facts: Decide how many servings you will have. Multiply the number of servings by the number of carbohydrates per serving. The resulting number is the total amount of carbohydrates that you will be having. Learning the standard serving sizes of foods When you eat  carbohydrate foods that are not packaged or do not include Nutrition Facts on the label, you need to measure the servings in order to count the amount of carbohydrates. Measure the foods that you will eat with a food scale or measuring cup, if needed. Decide how many standard-size servings you will eat. Multiply the number of servings by 15. For foods that contain carbohydrates, one serving equals 15 g of carbohydrates. For example, if you eat 2 cups or 10 oz (300 g) of strawberries, you will have eaten 2 servings and 30 g of carbohydrates (2 servings x 15 g = 30 g). For foods that have more than one food mixed, such as soups and casseroles, you must count the carbohydrates in each food that is included. The following list contains standard serving sizes of common carbohydrate-rich foods. Each of these servings has about 15 g of carbohydrates: 1 slice of bread. 1 six-inch (15 cm) tortilla. ? cup or 2 oz (53 g) cooked rice or pasta.  cup or 3 oz (85 g) cooked or canned, drained and rinsed beans or lentils.  cup or 3 oz (85 g) starchy vegetable, such as peas, corn, or squash.  cup or 4 oz (120 g) hot cereal.  cup or 3 oz (85 g) boiled or mashed potatoes, or  or 3 oz (85 g) of a large baked potato.  cup or 4 fl oz (118 mL) fruit juice. 1 cup or 8 fl oz (237 mL) milk. 1 small or 4 oz (106 g) apple.  or 2 oz (63 g) of a medium banana. 1 cup or 5 oz (150 g) strawberries. 3 cups or 1 oz (24 g) popped popcorn. What is an example of carbohydrate counting? To calculate the number of carbohydrates in this sample meal, follow the steps shown below. Sample meal 3 oz (85 g) chicken breast. ? cup or 4 oz (106 g) brown rice.  cup or 3 oz (85 g) corn. 1 cup or 8 fl oz (237 mL) milk. 1 cup or 5 oz (150 g) strawberries with sugar-free whipped topping. Carbohydrate calculation Identify the foods that contain carbohydrates: Rice. Corn. Milk. Strawberries. Calculate how many servings you have of  each food: 2 servings rice. 1 serving corn. 1 serving milk. 1 serving strawberries. Multiply each number of servings by 15 g: 2 servings rice x 15 g = 30 g. 1 serving corn x 15 g = 15 g. 1 serving milk x 15 g = 15 g. 1 serving strawberries x 15 g = 15 g. Add together all of the amounts to find the total grams of carbohydrates eaten: 30 g + 15 g + 15 g + 15 g = 75 g of carbohydrates total. What are tips for following this plan? Shopping Develop a meal plan and then make a shopping list. Buy fresh and frozen vegetables, fresh and frozen fruit, dairy, eggs, beans, lentils, and whole grains. Look at food labels. Choose foods that have more fiber and less sugar. Avoid processed foods and foods with added sugars. Meal planning Aim to have the same amount of carbohydrates at each meal and for each snack time. Plan to have regular, balanced meals and snacks. Where to find more information American Diabetes Association: www.diabetes.org Centers for Disease Control and Prevention: http://www.wolf.info/ Summary Carbohydrate counting is a method of keeping track of how many carbohydrates you eat. Eating carbohydrates naturally increases the amount of sugar (glucose) in the blood. Counting how many carbohydrates you eat improves your blood glucose control, which helps you manage your diabetes. A dietitian can help you make a meal plan and calculate how many carbohydrates you should have at each meal and snack. This information is not intended to replace advice given to you by your health care provider. Make sure you discuss any questions you have with your health care provider. Document Revised: 05/02/2019 Document Reviewed: 05/03/2019 Elsevier Patient Education  2021 [employer].

## 2021-02-05 ENCOUNTER — Other Ambulatory Visit: Payer: Self-pay

## 2021-02-08 ENCOUNTER — Other Ambulatory Visit: Payer: Self-pay

## 2021-02-08 MED ORDER — CALCIUM ACETATE (PHOS BINDER) 667 MG PO CAPS
667.0000 mg | ORAL_CAPSULE | Freq: Three times a day (TID) | ORAL | 11 refills | Status: DC
  Filled 2021-02-08: qty 90, 30d supply, fill #0

## 2021-02-09 LAB — UA/M W/RFLX CULTURE, ROUTINE
Bilirubin, UA: NEGATIVE
Ketones, UA: NEGATIVE
Nitrite, UA: NEGATIVE
Specific Gravity, UA: 1.027 (ref 1.005–1.030)
Urobilinogen, Ur: 0.2 mg/dL (ref 0.2–1.0)
pH, UA: 7.5 (ref 5.0–7.5)

## 2021-02-09 LAB — MICROALBUMIN / CREATININE URINE RATIO
Creatinine, Urine: 55.8 mg/dL
Microalb/Creat Ratio: 29509 mg/g creat — ABNORMAL HIGH (ref 0–29)
Microalbumin, Urine: 16466 ug/mL

## 2021-02-09 LAB — MICROSCOPIC EXAMINATION
Epithelial Cells (non renal): >10 /hpf — AB (ref 0–10)
RBC, Urine: NONE SEEN /hpf (ref 0–2)

## 2021-02-09 LAB — URINE CULTURE, REFLEX

## 2021-02-10 ENCOUNTER — Other Ambulatory Visit: Payer: Self-pay

## 2021-02-11 ENCOUNTER — Other Ambulatory Visit: Payer: Self-pay

## 2021-02-24 ENCOUNTER — Other Ambulatory Visit: Payer: Self-pay

## 2021-02-25 ENCOUNTER — Other Ambulatory Visit: Payer: Self-pay

## 2021-02-25 ENCOUNTER — Ambulatory Visit: Payer: Self-pay

## 2021-02-25 DIAGNOSIS — Z79899 Other long term (current) drug therapy: Secondary | ICD-10-CM

## 2021-02-25 NOTE — Progress Notes (Signed)
[facility] Visit Note  Patient: [name] MRN: [id number] Date of Birth: 09/24/1975 PCP: System, Provider Not In   [name] 45 y.o. female presents for a MTM visit today. Identified by name and date of birth.  There were no vitals taken for this visit.  Patient Information   Past Medical History:  Diagnosis Date   Anemia    Asthma    well controlled   Complication of anesthesia    "takes alot for anesthesia to work"   Concussion    h/o   Depression    Diabetes (HCC)    last A1c was 5.8 on 09-04-20   Dialysis patient (HCC)    T, Th, and Sat   Family history of adverse reaction to anesthesia    son-became aggressive after surgery   GERD (gastroesophageal reflux disease)    occ no meds   Headache    migraines   Hypertension    Palpitations    Pneumonia    h/o   Renal disorder       Past Surgical History:  Procedure Laterality Date   AV FISTULA PLACEMENT Left 11/20/2020   Procedure: ARTERIOVENOUS (AV) FISTULA CREATION ( BRACHIAL CEPHALIC );  Surgeon: Schnier, Gregory G, MD;  Location: [facility] ORS;  Service: Vascular;  Laterality: Left;   CESAREAN SECTION N/A    x3   CHOLECYSTECTOMY     DIALYSIS/PERMA CATHETER INSERTION N/A 08/24/2020   Procedure: DIALYSIS/PERMA CATHETER INSERTION;  Surgeon: Dew, Jason S, MD;  Location: [facility];  Service: Cardiovascular;  Laterality: N/A;     Family History  Problem Relation Age of Onset   Diabetes Mother    Skin cancer Mother    Heart attack Father        died at ~100 years of age   Other Sister        hit by a motorcycle   Alcohol abuse Brother    Diabetes Brother    HIV Brother    Family Support: Comments:Patient has seen Heather Pruitt, LSCWA at [facility]. See health maintenance below.   Lifestyle Diet: Breakfast: oatmeal, sausage biscuits, or eggs  Lunch:sandwiches Snacks: patient notes she has sweets on the weekends            Social History   Substance and  Sexual Activity  Alcohol Use Never      Social History   Tobacco Use  Smoking Status Never  Smokeless Tobacco Never      Health Maintenance  Topic Date Due   COVID-19 Vaccine (1) Never done   OPHTHALMOLOGY EXAM  Never done   Hepatitis C Screening  Never done   PAP SMEAR-Modifier  Never done   COLONOSCOPY (Pts 45-49yrs Insurance coverage will need to be confirmed)  Never done   INFLUENZA VACCINE  08/13/2021 (Originally 12/14/2020)   HEMOGLOBIN A1C  06/07/2021   FOOT EXAM  02/04/2022   URINE MICROALBUMIN  02/04/2022   TETANUS/TDAP  08/03/2025   HIV Screening  Completed   HPV VACCINES  Aged Out   Outpatient Encounter Medications as of 02/25/2021  Medication Sig   acetaminophen (TYLENOL) 500 MG tablet Take 500-1,000 mg by mouth every 6 (six) hours as needed (pain). Takes 3 times per week on dialysis days   amLODipine (NORVASC) 10 MG tablet Take 1 tablet (10 mg total) by mouth once daily.   Blood Glucose Monitoring Suppl (RIGHTEST GM550 BLOOD GLUCOSE) w/Device KIT USE AS DIRECTED UP TO FOUR TIMES DAILY.   Blood Pressure   KIT USE AS DIRECTED.   CALCITRIOL IV Inject 2 mcg into the vein 3 (three) times a week.   calcium carbonate (TUMS - DOSED IN MG ELEMENTAL CALCIUM) 500 MG chewable tablet Chew 1 tablet (200 mg of elemental calcium total) by mouth 3 (three) times daily with meals.   CINNAMON PO Take by mouth. Patient taking 1 cup of cinnamon tea   diphenhydrAMINE (BENADRYL) 25 MG tablet Take 25 mg by mouth every 6 (six) hours as needed. Taking as needed   epoetin alfa (EPOGEN) 10000 UNIT/ML injection Inject 1,400 Units into the vein 3 (three) times a week.   glucose blood (RIGHTEST GS550 BLOOD GLUCOSE) test strip USE AS DIRECTED UP TO FOUR TIMES DAILY.   Insulin Glargine (BASAGLAR KWIKPEN) 100 UNIT/ML Inject 14 Units into the skin once daily at bedtime.   Insulin Pen Needle 32G X 4 MM MISC USE AS DIRECTED ONCE DAILY AT BEDTIME.   Iron Sucrose (VENOFER IV) Inject 100 mg into the vein 3  (three) times a week.   lidocaine-prilocaine (EMLA) cream Apply topically to access site 30 minutes prior to treatment.   lidocaine-prilocaine (EMLA) cream Apply 1 application topically to the affected area(s) daily as needed.   metoprolol succinate (TOPROL-XL) 50 MG 24 hr tablet Take 1 tablet (50 mg total) by mouth once daily. Take with or immediately following a meal.   Rightest GL300 Lancets MISC USE AS DIRECTED UP TO FOUR TIMES DAILY.   calcium acetate (PHOSLO) 667 MG capsule Take 1 capsule (667 mg total) by mouth 3 (three) times daily with meals. (Patient not taking: Reported on 02/25/2021)   calcium acetate (PHOSLO) 667 MG tablet Take 1,334 mg by mouth 3 (three) times daily. (Patient not taking: No sig reported)   No facility-administered encounter medications on file as of 02/25/2021.      Health Maintenance/Date Completed  Last ED visit: 8/21 Last Visit to PCP: 9/22 Next Visit to PCP:  Specialist Visit: Dr. Candiss Norse. [facility] for HD on Tue/Th/Sat Flu Vaccine: No.  Pneumonia Vaccine: Yes (1 dose) COVID-19 Vaccine: No. Patient is afraid of side effects.   Assessment and Plan: Type 2 Diabetes Mellitus: Uncontrolled. A1c recently increased from 5.8% on 4/22 to 10.8% on 7/22. Managed on Basaglar 14 units at bedtime. Checks blood glucose at home three times daily, with values 130-160 during the week days and ~200's on the weekends. Educated on low carbohydrate diet.  Patient notes symptoms of hypoglycemia recently (sweating, tremor) at blood glucose values of 90-100. Explained to patient that hypoglycemia symptoms can occur at BG > 70 if her typical values have been elevated. Educated on hypoglycemia management, including the "rule of 15's".   Hypertension and ESRD on HD Tue/Th/Sat: Uncontrolled with BP goal < 130/80 per primary care. Elevated to ~140/80 today in clinic, although possibly inaccurate as reading was taken from the forearm (Taken from R arm, fistula on L arm). Recent clinic  values have also been elevated.  Checks blood pressure at home with typical readings of 130-140/100. Patient confirms adherence to medication. Last fill date overdue. Patient requests refils of medications during appointment. Medications provided. Patient reports noticing elevated BP when she is feeling stressed for which she takes chamimole and cinnamon tea to self-treat. Discussed as below.  Health maintenance:  Patient expresses significant stress in her home life in addition to her ESRD. Patient reports she is overwhelmed by the number of doctors visits. Noted counselor at [facility] ([place], [place]) referred patient to [facility]. Patient has  not set up an appointment with [facility] due to transportation barriers. Advised patient to follow up with Jerrilyn Cairo, LCSWA at [facility] to discuss options for her mental health management. Has not received flu vaccine this year. Informed patient of flu clinic on October 20th from 2-4:30PM at [facility]. Educated on the benefits of vaccination.   RTC: 6 months   Wynelle Cleveland, PharmD Pharmacy Resident  02/25/2021 11:24 AM

## 2021-03-10 ENCOUNTER — Other Ambulatory Visit: Payer: Self-pay

## 2021-03-10 DIAGNOSIS — N186 End stage renal disease: Secondary | ICD-10-CM

## 2021-03-10 MED ORDER — CIPROFLOXACIN HCL 500 MG PO TABS
ORAL_TABLET | ORAL | 0 refills | Status: DC
  Filled 2021-03-10: qty 3, 3d supply, fill #0

## 2021-03-11 LAB — LIPID PANEL
Chol/HDL Ratio: 4 ratio (ref 0.0–4.4)
Cholesterol, Total: 183 mg/dL (ref 100–199)
HDL: 46 mg/dL (ref >39)
LDL Chol Calc (NIH): 109 mg/dL — ABNORMAL HIGH (ref 0–99)
Triglycerides: 162 mg/dL — ABNORMAL HIGH (ref 0–149)
VLDL Cholesterol Cal: 28 mg/dL (ref 5–40)

## 2021-03-11 LAB — HEMOGLOBIN A1C
Est. average glucose Bld gHb Est-mCnc: 189 mg/dL
Hgb A1c MFr Bld: 8.2 % — ABNORMAL HIGH (ref 4.8–5.6)

## 2021-03-12 ENCOUNTER — Other Ambulatory Visit: Payer: Self-pay

## 2021-03-12 MED ORDER — CIPROFLOXACIN HCL 500 MG PO TABS
ORAL_TABLET | ORAL | 0 refills | Status: DC
  Filled 2021-03-12: qty 3, 3d supply, fill #0

## 2021-03-16 ENCOUNTER — Ambulatory Visit: Payer: Self-pay | Admitting: Gerontology

## 2021-03-25 ENCOUNTER — Other Ambulatory Visit: Payer: Self-pay

## 2021-03-25 ENCOUNTER — Ambulatory Visit: Payer: Self-pay | Admitting: Gerontology

## 2021-03-25 ENCOUNTER — Encounter: Payer: Self-pay | Admitting: Gerontology

## 2021-03-25 VITALS — BP 114/67 | HR 74 | Temp 97.9°F | Resp 16 | Ht 61.0 in | Wt 264.6 lb

## 2021-03-25 DIAGNOSIS — R300 Dysuria: Secondary | ICD-10-CM

## 2021-03-25 DIAGNOSIS — E1122 Type 2 diabetes mellitus with diabetic chronic kidney disease: Secondary | ICD-10-CM

## 2021-03-25 DIAGNOSIS — N186 End stage renal disease: Secondary | ICD-10-CM

## 2021-03-25 DIAGNOSIS — I10 Essential (primary) hypertension: Secondary | ICD-10-CM

## 2021-03-25 DIAGNOSIS — M545 Low back pain, unspecified: Secondary | ICD-10-CM

## 2021-03-25 MED ORDER — AMLODIPINE BESYLATE 10 MG PO TABS
10.0000 mg | ORAL_TABLET | Freq: Every day | ORAL | 2 refills | Status: DC
Start: 2021-03-25 — End: 2021-06-16
  Filled 2021-03-25: qty 30, 30d supply, fill #0
  Filled 2021-05-06: qty 30, 30d supply, fill #1

## 2021-03-25 NOTE — Patient Instructions (Signed)

## 2021-03-25 NOTE — Progress Notes (Signed)
Established Patient Office Visit  Subjective:  Patient ID: [name], female    DOB: 04/27/1976  Age: 45 y.o. MRN: [id number]  CC:  Chief Complaint  Patient presents with   Follow-up   Diabetes    HPI [name] is a 45 year old female who history of Asthma,T2 Diabetes, Hypertension, Renal disorder on hemodialysis, presents for routine follow-up. She states that she is compliant with her medication and continues to make healthy lifestyle changes. Her HgbA1c done on 03/10/21 decreased from 10.8% to 8.2%. She checks her blood glucose daily at home and reports readings 130-180's mg/dl. Postprandial blood glucose 211 mg/dL at the office today. She  denies hypo/hyperglycemia, peripheral neuropathy and performs daily foot daily. She denies chest pain, palpitation, dizziness, headache and vision changes. She goes to dialysis Monday/Wednesday/Friday. Thrill and bruit present at left [facility] AV fistula. She states that her right chest permacath should be removed in 2 weeks. She is oliguric, sometimes with a strong smell and dysuria. She does report some low back pain on the left side that she first noted a few days ago. She describes it as an intermittent dull ache, 3/10, that does not radiate. She denies numbness and tingling, no change in bowel or bladder control. Other than this, she states that she's doing well and offers no further complaint.    Past Medical History:  Diagnosis Date   Anemia    Asthma    well controlled   Complication of anesthesia    "takes alot for anesthesia to work"   Concussion    h/o   Depression    Diabetes ([place])    last A1c was 5.8 on 09-04-20   Dialysis patient ([place])    T, Th, and Sat   Family history of adverse reaction to anesthesia    son-became aggressive after surgery   GERD (gastroesophageal reflux disease)    occ no meds   Headache    migraines   Hypertension    Palpitations    Pneumonia    h/o   Renal disorder     Past Surgical History:   Procedure Laterality Date   AV FISTULA PLACEMENT Left 11/20/2020   Procedure: ARTERIOVENOUS (AV) FISTULA CREATION ( BRACHIAL CEPHALIC );  Surgeon: Katha Cabal, MD;  Location: [facility] ORS;  Service: Vascular;  Laterality: Left;   CESAREAN SECTION N/A    x3   CHOLECYSTECTOMY     DIALYSIS/PERMA CATHETER INSERTION N/A 08/24/2020   Procedure: DIALYSIS/PERMA CATHETER INSERTION;  Surgeon: Algernon Huxley, MD;  Location: [place] CV [facility];  Service: Cardiovascular;  Laterality: N/A;    Family History  Problem Relation Age of Onset   Diabetes Mother    Skin cancer Mother    Heart attack Father        died at ~44 years of age   Other Sister        hit by a motorcycle   Alcohol abuse Brother    Diabetes Brother    HIV Brother     Social History   Socioeconomic History   Marital status: Married    Spouse name: Not on file   Number of children: Not on file   Years of education: Not on file   Highest education level: Not on file  Occupational History   Not on file  Tobacco Use   Smoking status: Never   Smokeless tobacco: Never  Vaping Use   Vaping Use: Never used  Substance and Sexual Activity   Alcohol  use: Never   Drug use: Never   Sexual activity: Not on file  Other Topics Concern   Not on file  Social History Narrative   Not on file   Social Determinants of Health   Financial Resource Strain: Not on file  Food Insecurity: Food Insecurity Present   Worried About [place] in the Last Year: Often true   Ran Out of Food in the Last Year: Often true  Transportation Needs: Public librarian (Medical): Yes   Lack of Transportation (Non-Medical): Yes  Physical Activity: Not on file  Stress: Not on file  Social Connections: Not on file  Intimate Partner Violence: Not on file    Outpatient Medications Prior to Visit  Medication Sig Dispense Refill   acetaminophen (TYLENOL) 500 MG tablet Take 500-1,000 mg by mouth every 6  (six) hours as needed (pain). Takes 3 times per week on dialysis days     amLODipine (NORVASC) 10 MG tablet Take 1 tablet (10 mg total) by mouth once daily. 30 tablet 2   Blood Glucose Monitoring Suppl (RIGHTEST GM550 BLOOD GLUCOSE) w/Device KIT USE AS DIRECTED UP TO FOUR TIMES DAILY. 1 kit 0   Blood Pressure KIT USE AS DIRECTED. 1 kit 0   CALCITRIOL IV Inject 2 mcg into the vein 3 (three) times a week.     calcium carbonate (TUMS - DOSED IN MG ELEMENTAL CALCIUM) 500 MG chewable tablet Chew 1 tablet (200 mg of elemental calcium total) by mouth 3 (three) times daily with meals. 90 tablet 1   CINNAMON PO Take by mouth. Patient taking 1 cup of cinnamon tea     diphenhydrAMINE (BENADRYL) 25 MG tablet Take 25 mg by mouth every 6 (six) hours as needed. Taking as needed     epoetin alfa (EPOGEN) 10000 UNIT/ML injection Inject 1,400 Units into the vein 3 (three) times a week.     glucose blood (RIGHTEST GS550 BLOOD GLUCOSE) test strip USE AS DIRECTED UP TO FOUR TIMES DAILY. 100 strip 0   Insulin Glargine (BASAGLAR KWIKPEN) 100 UNIT/ML Inject 14 Units into the skin once daily at bedtime. 15 mL 3   Insulin Pen Needle 32G X 4 MM MISC USE AS DIRECTED ONCE DAILY AT BEDTIME. 100 each 2   Iron Sucrose (VENOFER IV) Inject 100 mg into the vein 3 (three) times a week.     lidocaine-prilocaine (EMLA) cream Apply 1 application topically to the affected area(s) daily as needed. 30 g 4   metoprolol succinate (TOPROL-XL) 50 MG 24 hr tablet Take 1 tablet (50 mg total) by mouth once daily. Take with or immediately following a meal. 30 tablet 3   Rightest GL300 Lancets MISC USE AS DIRECTED UP TO FOUR TIMES DAILY. 100 each 0   calcium acetate (PHOSLO) 667 MG capsule Take 1 capsule (667 mg total) by mouth 3 (three) times daily with meals. (Patient not taking: Reported on 03/25/2021) 90 capsule 11   calcium acetate (PHOSLO) 667 MG tablet Take 1,334 mg by mouth 3 (three) times daily. (Patient not taking: No sig reported)      ciprofloxacin (CIPRO) 500 MG tablet TAKE ONE TABLET BY MOUTH ONCE DAILY FOR 3 DAYS. 3 tablet 0   No facility-administered medications prior to visit.    Allergies  Allergen Reactions   Statins     Other reaction(s): Other (See Comments) "redness and facial swelling"   Cholesterol Rash    Cholesterol medicine    ROS Review  of Systems  Constitutional:  Negative for chills, diaphoresis, fatigue, fever and unexpected weight change.  HENT: Negative.    Eyes:  Negative for visual disturbance.  Respiratory:  Negative for chest tightness, shortness of breath and wheezing.   Cardiovascular:  Negative for chest pain and leg swelling.  Gastrointestinal:  Negative for nausea and vomiting.  Endocrine: Negative for polydipsia, polyphagia and polyuria.  Genitourinary:  Positive for dysuria and flank pain.  Musculoskeletal:  Positive for back pain.  Neurological:  Negative for weakness, light-headedness, numbness and headaches.     Objective:    Physical Exam Vitals and nursing note reviewed.  Constitutional:      General: She is not in acute distress.    Appearance: She is obese.  HENT:     Head: Normocephalic and atraumatic.  Cardiovascular:     Rate and Rhythm: Normal rate and regular rhythm.     Pulses: Normal pulses.     Heart sounds: Normal heart sounds.  Pulmonary:     Effort: Pulmonary effort is normal.     Breath sounds: Normal breath sounds.  Chest:       Comments: Hemodialysis catheter present. Dressing changed 03/24/21 at dialysis center. Abdominal:     General: Bowel sounds are normal.     Tenderness: There is no right CVA tenderness or left CVA tenderness.  Feet:     Right foot:     Skin integrity: Skin integrity normal. No ulcer or erythema.     Left foot:     Skin integrity: Skin integrity normal. No ulcer or erythema.  Skin:    General: Skin is warm and dry.     Capillary Refill: Capillary refill takes less than 2 seconds.  Neurological:     General: No  focal deficit present.     Mental Status: She is alert and oriented to person, place, and time.  Psychiatric:        Mood and Affect: Mood normal.        Thought Content: Thought content normal.    BP 114/67 (BP Location: Right Arm, Patient Position: Sitting, Cuff Size: Large)   Pulse 74   Temp 97.9 F (36.6 C)   Resp 16   Ht _0  (1.549 m)   Wt 264 lb 9.6 oz (120 kg)   LMP 03/14/2021 (Approximate)   SpO2 96%   BMI 50.00 kg/m  Wt Readings from Last 3 Encounters:  03/25/21 264 lb 9.6 oz (120 kg)  03/10/21 268 lb 8 oz (121.8 kg)  02/04/21 236 lb 1.6 oz (107.1 kg)   Encouraged weight loss  Health Maintenance Due  Topic Date Due   COVID-19 Vaccine (1) Never done   Pneumococcal Vaccine 75-53 Years old (1 - PCV) Never done   OPHTHALMOLOGY EXAM  Never done   Hepatitis C Screening  Never done   PAP SMEAR-Modifier  Never done   COLONOSCOPY (Pts 45-21yr Insurance coverage will need to be confirmed)  Never done    There are no preventive care reminders to display for this patient.  Lab Results  Component Value Date   TSH 3.921 08/29/2020   Lab Results  Component Value Date   WBC 7.1 01/05/2021   HGB 9.8 (L) 01/05/2021   HCT 28.8 (L) 01/05/2021   MCV 88.3 01/05/2021   PLT 270 01/05/2021   Lab Results  Component Value Date   NA 134 (L) 01/05/2021   K 4.3 01/05/2021   CO2 29 01/05/2021   GLUCOSE 165 (H) 01/05/2021  BUN 24 (H) 01/05/2021   CREATININE 5.78 (H) 01/05/2021   BILITOT 0.5 01/03/2021   ALKPHOS 101 01/03/2021   AST 16 01/03/2021   ALT 11 01/03/2021   PROT 6.7 01/03/2021   ALBUMIN 3.4 (L) 01/03/2021   CALCIUM 8.2 (L) 01/05/2021   ANIONGAP 9 01/05/2021   Lab Results  Component Value Date   CHOL 183 03/10/2021   Lab Results  Component Value Date   HDL 46 03/10/2021   Lab Results  Component Value Date   LDLCALC 109 (H) 03/10/2021   Lab Results  Component Value Date   TRIG 162 (H) 03/10/2021   Lab Results  Component Value Date   CHOLHDL 4.0  03/10/2021   Lab Results  Component Value Date   HGBA1C 8.2 (H) 03/10/2021      Assessment & Plan:   1. Essential hypertension - Blood pressure within goal at today's visit. Encouraged her to continue DASH diet and to keep measuring her blood pressure at home.  - amLODipine (NORVASC) 10 MG tablet; Take 1 tablet (10 mg total) by mouth once daily.  Dispense: 30 tablet; Refill: 2  2. Type 2 diabetes mellitus with chronic kidney disease on chronic dialysis, without long-term current use of insulin (HCC) - Hemoglobin A1C improved to 8.2%. She will continue on low carb/non concentrated sweet diet. She was encouraged to check blood glucose tid, record and bring log to follow up appointment. - POCT Glucose (CBG); Future  3. Acute left-sided low back pain without sciatica - Will check urine to rule out pyleonephritis. Advised her to contact clinic or go to ED if her symptoms worsen. - UA/M w/rflx Culture, Routine; Future - UA/M w/rflx Culture, Routine  4. Dysuria - same as above. - UA/M w/rflx Culture, Routine; Future - UA/M w/rflx Culture, Routine    Follow-up: Return in about 2 months (around 06/09/2021).    Harvin Hazel, RN

## 2021-03-26 ENCOUNTER — Other Ambulatory Visit: Payer: Self-pay

## 2021-03-30 ENCOUNTER — Other Ambulatory Visit: Payer: Self-pay

## 2021-03-30 MED ORDER — SEVELAMER CARBONATE 800 MG PO TABS
ORAL_TABLET | ORAL | 5 refills | Status: DC
  Filled 2021-03-30: qty 90, 30d supply, fill #0
  Filled 2021-05-06: qty 90, 30d supply, fill #1
  Filled 2021-07-01: qty 90, 30d supply, fill #2

## 2021-03-31 ENCOUNTER — Other Ambulatory Visit: Payer: Self-pay

## 2021-04-01 ENCOUNTER — Other Ambulatory Visit: Payer: Self-pay

## 2021-04-02 LAB — UA/M W/RFLX CULTURE, ROUTINE
Bilirubin, UA: NEGATIVE
Ketones, UA: NEGATIVE
Leukocytes,UA: NEGATIVE
Nitrite, UA: NEGATIVE
RBC, UA: NEGATIVE
Specific Gravity, UA: 1.023 (ref 1.005–1.030)
Urobilinogen, Ur: 0.2 mg/dL (ref 0.2–1.0)
pH, UA: 7.5 (ref 5.0–7.5)

## 2021-04-02 LAB — MICROSCOPIC EXAMINATION
Epithelial Cells (non renal): >10 /hpf — AB (ref 0–10)
RBC, Urine: NONE SEEN /hpf (ref 0–2)

## 2021-04-02 LAB — URINE CULTURE, REFLEX

## 2021-04-12 ENCOUNTER — Other Ambulatory Visit: Payer: Self-pay

## 2021-04-12 MED ORDER — AZITHROMYCIN 250 MG PO TABS
ORAL_TABLET | ORAL | 0 refills | Status: DC
  Filled 2021-04-12: qty 6, 5d supply, fill #0

## 2021-04-15 ENCOUNTER — Ambulatory Visit: Payer: Self-pay | Admitting: Gerontology

## 2021-04-15 ENCOUNTER — Other Ambulatory Visit: Payer: Self-pay

## 2021-04-15 VITALS — BP 144/86 | HR 78 | Temp 98.4°F | Wt 269.1 lb

## 2021-04-15 DIAGNOSIS — T3695XA Adverse effect of unspecified systemic antibiotic, initial encounter: Secondary | ICD-10-CM | POA: Insufficient documentation

## 2021-04-15 MED ORDER — FLUCONAZOLE 100 MG PO TABS
100.0000 mg | ORAL_TABLET | Freq: Every day | ORAL | 0 refills | Status: DC
  Filled 2021-04-15: qty 2, 3d supply, fill #0

## 2021-04-15 NOTE — Progress Notes (Signed)
Established Patient Office Visit  Subjective:  Patient ID: [name], female    DOB: [birth year]  Age: 45 y.o. MRN: [id number]  CC:  Chief Complaint  Patient presents with   Fever    Fever & chills x2 weeks    HPI [name]  is a 45 year old female who history of Asthma,T2 Diabetes, Hypertension, Renal disorder on hemodialysis,presents for c/o having fever and chills that has been going on for 1 month. Dr Candiss Norse prescribed Zithromycin and currently experiences vaginal discharge and itching. She reports that she forgot to notify Dr Candiss Norse that she develops vaginal yeast with taking antibiotics. She denies fever, chills, dysuria, urinary frequency, urgency, but c/o whitish vaginal discharge. Overall, she states that she's doing well and offers no further complaint.   Past Medical History:  Diagnosis Date   Anemia    Asthma    well controlled   Complication of anesthesia    "takes alot for anesthesia to work"   Concussion    h/o   Depression    Diabetes ([place])    last A1c was 5.8 on 09-04-20   Dialysis patient ([place])    T, Th, and Sat   Family history of adverse reaction to anesthesia    son-became aggressive after surgery   GERD (gastroesophageal reflux disease)    occ no meds   Headache    migraines   Hypertension    Palpitations    Pneumonia    h/o   Renal disorder     Past Surgical History:  Procedure Laterality Date   AV FISTULA PLACEMENT Left 11/20/2020   Procedure: ARTERIOVENOUS (AV) FISTULA CREATION ( BRACHIAL CEPHALIC );  Surgeon: Katha Cabal, MD;  Location: [facility];  Service: Vascular;  Laterality: Left;   CESAREAN SECTION N/A    x3   CHOLECYSTECTOMY     DIALYSIS/PERMA CATHETER INSERTION N/A 08/24/2020   Procedure: DIALYSIS/PERMA CATHETER INSERTION;  Surgeon: Algernon Huxley, MD;  Location: [place] [facility];  Service: Cardiovascular;  Laterality: N/A;    Family History  Problem Relation Age of Onset   Diabetes Mother    Skin cancer Mother     Heart attack Father        died at ~38 years of age   Other Sister        hit by a motorcycle   Alcohol abuse Brother    Diabetes Brother    HIV Brother     Social History   Socioeconomic History   Marital status: Married    Spouse name: Not on file   Number of children: Not on file   Years of education: Not on file   Highest education level: Not on file  Occupational History   Not on file  Tobacco Use   Smoking status: Never   Smokeless tobacco: Never  Vaping Use   Vaping Use: Never used  Substance and Sexual Activity   Alcohol use: Never   Drug use: Never   Sexual activity: Not on file  Other Topics Concern   Not on file  Social History Narrative   Not on file   Social Determinants of Health   Financial Resource Strain: Not on file  Food Insecurity: Food Insecurity Present   Worried About [place] in the Last Year: Often true   Arboriculturist in the Last Year: Often true  Transportation Needs: Unmet Transportation Needs   Lack of Transportation (Medical): Yes   Lack of Transportation (  Non-Medical): Yes  Physical Activity: Not on file  Stress: Not on file  Social Connections: Not on file  Intimate Partner Violence: Not on file    Outpatient Medications Prior to Visit  Medication Sig Dispense Refill   acetaminophen (TYLENOL) 500 MG tablet Take 500-1,000 mg by mouth every 6 (six) hours as needed (pain). Takes 3 times per week on dialysis days     amLODipine (NORVASC) 10 MG tablet Take 1 tablet (10 mg total) by mouth once daily. 30 tablet 2   azithromycin (ZITHROMAX) 250 MG tablet TAKE 2 TABLETS BY MOUTH ONCE DAILY ON DAY 1. THEN TAKE ONE TABLET BY MOUTH ONCE DAILY ON DAY 2-5. 6 tablet 0   Blood Glucose Monitoring Suppl (RIGHTEST GM550 BLOOD GLUCOSE) w/Device KIT USE AS DIRECTED UP TO FOUR TIMES DAILY. 1 kit 0   Blood Pressure KIT USE AS DIRECTED. 1 kit 0   CALCITRIOL IV Inject 2 mcg into the vein 3 (three) times a week.     calcium acetate  (PHOSLO) 667 MG tablet Take 1,334 mg by mouth 3 (three) times daily. (Patient not taking: No sig reported)     CINNAMON PO Take by mouth. Patient taking 1 cup of cinnamon tea     ciprofloxacin (CIPRO) 500 MG tablet TAKE ONE TABLET BY MOUTH ONCE DAILY FOR 3 DAYS. 3 tablet 0   diphenhydrAMINE (BENADRYL) 25 MG tablet Take 25 mg by mouth every 6 (six) hours as needed. Taking as needed     epoetin alfa (EPOGEN) 10000 UNIT/ML injection Inject 1,400 Units into the vein 3 (three) times a week.     glucose blood (RIGHTEST GS550 BLOOD GLUCOSE) test strip USE AS DIRECTED UP TO FOUR TIMES DAILY. 100 strip 0   Insulin Glargine (BASAGLAR KWIKPEN) 100 UNIT/ML Inject 14 Units into the skin once daily at bedtime. 15 mL 3   Insulin Pen Needle 32G X 4 MM MISC USE AS DIRECTED ONCE DAILY AT BEDTIME. 100 each 2   Iron Sucrose (VENOFER IV) Inject 100 mg into the vein 3 (three) times a week.     lidocaine-prilocaine (EMLA) cream Apply 1 application topically to the affected area(s) daily as needed. 30 g 4   metoprolol succinate (TOPROL-XL) 50 MG 24 hr tablet Take 1 tablet (50 mg total) by mouth once daily. Take with or immediately following a meal. 30 tablet 3   Rightest GL300 Lancets MISC USE AS DIRECTED UP TO FOUR TIMES DAILY. 100 each 0   sevelamer carbonate (RENVELA) 800 MG tablet TAKE ONE TABLET BY MOUTH 3 TIMES DAILY WITH MEALS. 90 tablet 5   No facility-administered medications prior to visit.    Allergies  Allergen Reactions   Statins     Other reaction(s): Other (See Comments) "redness and facial swelling"   Cholesterol Rash    Cholesterol medicine    ROS Review of Systems  Constitutional: Negative.   Respiratory: Negative.    Cardiovascular: Negative.   Gastrointestinal:  Positive for abdominal pain.  Genitourinary:  Positive for vaginal discharge. Negative for dysuria, flank pain, frequency, hematuria, pelvic pain, urgency and vaginal pain.     Objective:    Physical Exam Cardiovascular:      Rate and Rhythm: Normal rate and regular rhythm.     Pulses: Normal pulses.     Heart sounds: Normal heart sounds.  Pulmonary:     Effort: Pulmonary effort is normal.     Breath sounds: Normal breath sounds. No rales.  Abdominal:     Tenderness:  There is no right CVA tenderness or left CVA tenderness.  Neurological:     General: No focal deficit present.     Mental Status: She is alert and oriented to person, place, and time. Mental status is at baseline.    BP (!) 144/86 (BP Location: Right Arm, Patient Position: Sitting, Cuff Size: Large)   Pulse 78   Temp 98.4 F (36.9 C)   Wt 269 lb 1.6 oz (122.1 kg)   SpO2 98%   BMI 50.85 kg/m  Wt Readings from Last 3 Encounters:  04/15/21 269 lb 1.6 oz (122.1 kg)  03/25/21 264 lb 9.6 oz (120 kg)  03/10/21 268 lb 8 oz (121.8 kg)   Encouraged to loose weight  Health Maintenance Due  Topic Date Due   COVID-19 Vaccine (1) Never done   Pneumococcal Vaccine 21-61 Years old (1 - PCV) Never done   OPHTHALMOLOGY EXAM  Never done   Hepatitis C Screening  Never done   PAP SMEAR-Modifier  Never done   COLONOSCOPY (Pts 45-80yr Insurance coverage will need to be confirmed)  Never done    There are no preventive care reminders to display for this patient.  Lab Results  Component Value Date   TSH 3.921 08/29/2020   Lab Results  Component Value Date   WBC 7.1 01/05/2021   HGB 9.8 (L) 01/05/2021   HCT 28.8 (L) 01/05/2021   MCV 88.3 01/05/2021   PLT 270 01/05/2021   Lab Results  Component Value Date   NA 134 (L) 01/05/2021   K 4.3 01/05/2021   CO2 29 01/05/2021   GLUCOSE 165 (H) 01/05/2021   BUN 24 (H) 01/05/2021   CREATININE 5.78 (H) 01/05/2021   BILITOT 0.5 01/03/2021   ALKPHOS 101 01/03/2021   AST 16 01/03/2021   ALT 11 01/03/2021   PROT 6.7 01/03/2021   ALBUMIN 3.4 (L) 01/03/2021   CALCIUM 8.2 (L) 01/05/2021   ANIONGAP 9 01/05/2021   Lab Results  Component Value Date   CHOL 183 03/10/2021   Lab Results  Component  Value Date   HDL 46 03/10/2021   Lab Results  Component Value Date   LDLCALC 109 (H) 03/10/2021   Lab Results  Component Value Date   TRIG 162 (H) 03/10/2021   Lab Results  Component Value Date   CHOLHDL 4.0 03/10/2021   Lab Results  Component Value Date   HGBA1C 8.2 (H) 03/10/2021      Assessment & Plan:      1. Antibiotic-induced yeast infection - Possibly antibiotic induced yeast infection and will start Fluconazole, was educated on medication side effects and advised to notify clinic. - fluconazole (DIFLUCAN) 100 MG tablet; Take 1 tablet (100 mg) by mouth once on 04/16/21 after dialysis and then take the second tablet (100 mg) by mouth on 04/19/21 after dialysis.  Dispense: 2 tablet; Refill: 0   Follow-up: Return in about 9 weeks (around 06/17/2021), or if symptoms worsen or fail to improve.    Kweku Stankey EJerold Coombe NP

## 2021-04-16 ENCOUNTER — Other Ambulatory Visit: Payer: Self-pay

## 2021-04-29 ENCOUNTER — Ambulatory Visit: Payer: Self-pay

## 2021-05-06 ENCOUNTER — Other Ambulatory Visit: Payer: Self-pay

## 2021-05-19 ENCOUNTER — Ambulatory Visit: Payer: Self-pay | Admitting: Internal Medicine

## 2021-05-19 ENCOUNTER — Other Ambulatory Visit: Payer: Self-pay

## 2021-05-19 DIAGNOSIS — R058 Other specified cough: Secondary | ICD-10-CM

## 2021-05-19 DIAGNOSIS — R059 Cough, unspecified: Secondary | ICD-10-CM | POA: Insufficient documentation

## 2021-05-19 MED ORDER — GUAIFENESIN-DM 100-10 MG/5ML PO SYRP
5.0000 mL | ORAL_SOLUTION | ORAL | 1 refills | Status: AC | PRN
Start: 2021-05-19 — End: 2021-05-29
  Filled 2021-05-19: qty 70, 3d supply, fill #0

## 2021-05-19 NOTE — Progress Notes (Signed)
Established Patient Office Visit  Subjective:  Patient ID: [name], female    DOB: [birth year]  Age: 46 y.o. MRN: [id number]  CC:  Chief Complaint  Patient presents with   Cough    Itchy throat, cough, runny nose, right ear pain    HPI [name] is a 46 y/o female who presents via telephone for evaluation for cough, runny nose, and right ear pain. She thinks it is bronchitis. Reports shortness of breath but denies wheezing. Reports this happens often when she is sick and her throat hurts. Pt states she is taking Tylenol, Benadryl, cough drop, and tea/honey with no relief. Denies fever, reports fatigue.  Patient gets dialysis M/W/F.   Got COVID tested 2-3 weeks ago. Negative. Has not had a flu test but has had the flu shot.   Explained to pt that this is probably viral and she does not need antibiotics. Explained it may take up to 6 weeks to clear the virus. Will call in liquid decongestant.  Explained importance of monitoring her chronic conditions and told her if she is not better in a week or if she gets worse she should go to the ED for evaluation, given her many chronic conditions.   Past Medical History:  Diagnosis Date   Anemia    Asthma    well controlled   Complication of anesthesia    "takes alot for anesthesia to work"   Concussion    h/o   Depression    Diabetes ([place])    last A1c was 5.8 on 09-04-20   Dialysis patient ([place])    T, Th, and Sat   Family history of adverse reaction to anesthesia    son-became aggressive after surgery   GERD (gastroesophageal reflux disease)    occ no meds   Headache    migraines   Hypertension    Palpitations    Pneumonia    h/o   Renal disorder     Past Surgical History:  Procedure Laterality Date   AV FISTULA PLACEMENT Left 11/20/2020   Procedure: ARTERIOVENOUS (AV) FISTULA CREATION ( BRACHIAL CEPHALIC );  Surgeon: Katha Cabal, MD;  Location: [facility];  Service: Vascular;  Laterality: Left;   CESAREAN  SECTION N/A    x3   CHOLECYSTECTOMY     DIALYSIS/PERMA CATHETER INSERTION N/A 08/24/2020   Procedure: DIALYSIS/PERMA CATHETER INSERTION;  Surgeon: Algernon Huxley, MD;  Location: [place] [facility];  Service: Cardiovascular;  Laterality: N/A;    Family History  Problem Relation Age of Onset   Diabetes Mother    Skin cancer Mother    Heart attack Father        died at ~58 years of age   Other Sister        hit by a motorcycle   Alcohol abuse Brother    Diabetes Brother    HIV Brother     Social History   Socioeconomic History   Marital status: Married    Spouse name: Not on file   Number of children: Not on file   Years of education: Not on file   Highest education level: Not on file  Occupational History   Not on file  Tobacco Use   Smoking status: Never   Smokeless tobacco: Never  Vaping Use   Vaping Use: Never used  Substance and Sexual Activity   Alcohol use: Never   Drug use: Never   Sexual activity: Not on file  Other Topics Concern  Not on file  Social History Narrative   Not on file   Social Determinants of Health   Financial Resource Strain: Not on file  Food Insecurity: Food Insecurity Present   Worried About Charity fundraiser in the Last Year: Often true   Arboriculturist in the Last Year: Often true  Transportation Needs: Public librarian (Medical): Yes   Lack of Transportation (Non-Medical): Yes  Physical Activity: Not on file  Stress: Not on file  Social Connections: Not on file  Intimate Partner Violence: Not on file    Outpatient Medications Prior to Visit  Medication Sig Dispense Refill   acetaminophen (TYLENOL) 500 MG tablet Take 500-1,000 mg by mouth every 6 (six) hours as needed (pain). Takes 3 times per week on dialysis days     amLODipine (NORVASC) 10 MG tablet Take 1 tablet (10 mg total) by mouth once daily. 30 tablet 2   Blood Glucose Monitoring Suppl (RIGHTEST GM550 BLOOD GLUCOSE) w/Device KIT  USE AS DIRECTED UP TO FOUR TIMES DAILY. 1 kit 0   Blood Pressure KIT USE AS DIRECTED. 1 kit 0   CALCITRIOL IV Inject 2 mcg into the vein 3 (three) times a week.     calcium acetate (PHOSLO) 667 MG tablet Take 1,334 mg by mouth 3 (three) times daily. (Patient not taking: No sig reported)     CINNAMON PO Take by mouth. Patient taking 1 cup of cinnamon tea     ciprofloxacin (CIPRO) 500 MG tablet TAKE ONE TABLET BY MOUTH ONCE DAILY FOR 3 DAYS. 3 tablet 0   diphenhydrAMINE (BENADRYL) 25 MG tablet Take 25 mg by mouth every 6 (six) hours as needed. Taking as needed     epoetin alfa (EPOGEN) 10000 UNIT/ML injection Inject 1,400 Units into the vein 3 (three) times a week.     glucose blood (RIGHTEST GS550 BLOOD GLUCOSE) test strip USE AS DIRECTED UP TO FOUR TIMES DAILY. 100 strip 0   Insulin Glargine (BASAGLAR KWIKPEN) 100 UNIT/ML Inject 14 Units into the skin once daily at bedtime. 15 mL 3   Insulin Pen Needle 32G X 4 MM MISC USE AS DIRECTED ONCE DAILY AT BEDTIME. 100 each 2   Iron Sucrose (VENOFER IV) Inject 100 mg into the vein 3 (three) times a week.     lidocaine-prilocaine (EMLA) cream Apply 1 application topically to the affected area(s) daily as needed. 30 g 4   metoprolol succinate (TOPROL-XL) 50 MG 24 hr tablet Take 1 tablet (50 mg total) by mouth once daily. Take with or immediately following a meal. 30 tablet 3   Rightest GL300 Lancets MISC USE AS DIRECTED UP TO FOUR TIMES DAILY. 100 each 0   sevelamer carbonate (RENVELA) 800 MG tablet TAKE ONE TABLET BY MOUTH 3 TIMES DAILY WITH MEALS. 90 tablet 5   No facility-administered medications prior to visit.    Allergies  Allergen Reactions   Statins     Other reaction(s): Other (See Comments) "redness and facial swelling"   Cholesterol Rash    Cholesterol medicine    ROS Review of Systems    Objective:    Physical Exam  There were no vitals taken for this visit. Wt Readings from Last 3 Encounters:  04/15/21 269 lb 1.6 oz (122.1  kg)  03/25/21 264 lb 9.6 oz (120 kg)  03/10/21 268 lb 8 oz (121.8 kg)     Health Maintenance Due  Topic Date Due   COVID-19 Vaccine (  1) Never done   Pneumococcal Vaccine 60-93 Years old (1 - PCV) Never done   OPHTHALMOLOGY EXAM  Never done   Hepatitis C Screening  Never done   PAP SMEAR-Modifier  Never done   COLONOSCOPY (Pts 45-35yr Insurance coverage will need to be confirmed)  Never done    There are no preventive care reminders to display for this patient.  Lab Results  Component Value Date   TSH 3.921 08/29/2020   Lab Results  Component Value Date   WBC 7.1 01/05/2021   HGB 9.8 (L) 01/05/2021   HCT 28.8 (L) 01/05/2021   MCV 88.3 01/05/2021   PLT 270 01/05/2021   Lab Results  Component Value Date   NA 134 (L) 01/05/2021   K 4.3 01/05/2021   CO2 29 01/05/2021   GLUCOSE 165 (H) 01/05/2021   BUN 24 (H) 01/05/2021   CREATININE 5.78 (H) 01/05/2021   BILITOT 0.5 01/03/2021   ALKPHOS 101 01/03/2021   AST 16 01/03/2021   ALT 11 01/03/2021   PROT 6.7 01/03/2021   ALBUMIN 3.4 (L) 01/03/2021   CALCIUM 8.2 (L) 01/05/2021   ANIONGAP 9 01/05/2021   Lab Results  Component Value Date   CHOL 183 03/10/2021   Lab Results  Component Value Date   HDL 46 03/10/2021   Lab Results  Component Value Date   LDLCALC 109 (H) 03/10/2021   Lab Results  Component Value Date   TRIG 162 (H) 03/10/2021   Lab Results  Component Value Date   CHOLHDL 4.0 03/10/2021   Lab Results  Component Value Date   HGBA1C 8.2 (H) 03/10/2021      Assessment & Plan:   1. Other cough  3-4 weeks of flu like syndrome with immediate family members demonstrating similar symptoms in the past weeks. Has denied any fever or significant sputum production. Has had cough, sore throat, and shortness of breath. Also reports pain in her right ear, as well as continued coughing. Denied any wheezing. Has been using Tylenol, Benadryl, and hot tea etc.... has underlying asthma, chronic renal disease - on  dialysis, and has a reported recent negative COVID test 2 weeks ago.  Examination: patient did not appear to be significantly short of breath during phone interview. Minimal cough detected no audible wheezes detected. Mentally alert and gave a good overview of her medical conditions/symptoms over the last month.  I reminded her that physical examination was helpful and that an in-person visit would provide more clarity. She is aware that she has to have a COVID test to be seen here and has not had one in the past 10 days. I have elected not to start her on an antibiotic nor pavlova, with a low probability this is a reoccurrence of her COVID. Will continue symptomatic support with guaifenesin dextromethorphan, with reminder that if her condition doe snot improve or she develops fever, short breath, or wheezing she should seek immediate medical treatment (Emergency room).   Follow-up: Return if symptoms worsen or fail to improve.    GDede Query CMA

## 2021-05-20 ENCOUNTER — Other Ambulatory Visit: Payer: Self-pay

## 2021-05-21 ENCOUNTER — Other Ambulatory Visit: Payer: Self-pay

## 2021-05-21 MED ORDER — AZITHROMYCIN 250 MG PO TABS
ORAL_TABLET | ORAL | 0 refills | Status: DC
  Filled 2021-05-21: qty 6, 5d supply, fill #0

## 2021-05-26 ENCOUNTER — Other Ambulatory Visit: Payer: Self-pay

## 2021-05-26 ENCOUNTER — Telehealth (INDEPENDENT_AMBULATORY_CARE_PROVIDER_SITE_OTHER): Payer: Self-pay

## 2021-05-26 MED ORDER — OMEPRAZOLE 20 MG PO CPDR
20.0000 mg | DELAYED_RELEASE_CAPSULE | Freq: Every day | ORAL | 3 refills | Status: DC
Start: 2021-05-26 — End: 2024-01-04
  Filled 2021-05-26: qty 30, 30d supply, fill #0
  Filled 2021-06-30: qty 30, 30d supply, fill #1
  Filled 2021-08-12: qty 60, 60d supply, fill #2

## 2021-05-26 NOTE — Telephone Encounter (Signed)
I attempted to contact the patient to schedule a permcath removal and a message was left for a return call. 

## 2021-06-01 ENCOUNTER — Other Ambulatory Visit: Payer: Self-pay

## 2021-06-01 MED ORDER — FLUCONAZOLE 150 MG PO TABS
ORAL_TABLET | ORAL | 0 refills | Status: DC
  Filled 2021-06-01: qty 2, 2d supply, fill #0

## 2021-06-09 ENCOUNTER — Other Ambulatory Visit: Payer: Self-pay

## 2021-06-10 ENCOUNTER — Other Ambulatory Visit: Payer: Self-pay | Admitting: Gerontology

## 2021-06-10 ENCOUNTER — Other Ambulatory Visit: Payer: Self-pay

## 2021-06-10 DIAGNOSIS — N186 End stage renal disease: Secondary | ICD-10-CM

## 2021-06-10 DIAGNOSIS — N390 Urinary tract infection, site not specified: Secondary | ICD-10-CM

## 2021-06-10 DIAGNOSIS — E1122 Type 2 diabetes mellitus with diabetic chronic kidney disease: Secondary | ICD-10-CM

## 2021-06-11 ENCOUNTER — Telehealth (INDEPENDENT_AMBULATORY_CARE_PROVIDER_SITE_OTHER): Payer: Self-pay

## 2021-06-11 NOTE — Telephone Encounter (Signed)
I attempted to contact the patient to schedule a left arm fistulagram with Dr. Delana Meyer. A  message was left for a return call.

## 2021-06-12 LAB — UA/M W/RFLX CULTURE, ROUTINE

## 2021-06-12 LAB — HEMOGLOBIN A1C
Est. average glucose Bld gHb Est-mCnc: 180 mg/dL
Hgb A1c MFr Bld: 7.9 % — ABNORMAL HIGH (ref 4.8–5.6)

## 2021-06-16 ENCOUNTER — Ambulatory Visit: Payer: Self-pay | Admitting: Gerontology

## 2021-06-16 ENCOUNTER — Encounter: Payer: Self-pay | Admitting: Gerontology

## 2021-06-16 ENCOUNTER — Other Ambulatory Visit: Payer: Self-pay

## 2021-06-16 VITALS — BP 142/82 | HR 75 | Ht 61.0 in | Wt 278.3 lb

## 2021-06-16 DIAGNOSIS — I10 Essential (primary) hypertension: Secondary | ICD-10-CM

## 2021-06-16 DIAGNOSIS — B379 Candidiasis, unspecified: Secondary | ICD-10-CM

## 2021-06-16 DIAGNOSIS — E1122 Type 2 diabetes mellitus with diabetic chronic kidney disease: Secondary | ICD-10-CM

## 2021-06-16 DIAGNOSIS — N186 End stage renal disease: Secondary | ICD-10-CM

## 2021-06-16 DIAGNOSIS — T3695XA Adverse effect of unspecified systemic antibiotic, initial encounter: Secondary | ICD-10-CM

## 2021-06-16 LAB — GLUCOSE, POCT (MANUAL RESULT ENTRY): POC Glucose: 170 mg/dl — AB (ref 70–99)

## 2021-06-16 MED ORDER — FLUCONAZOLE 150 MG PO TABS
150.0000 mg | ORAL_TABLET | Freq: Every day | ORAL | 0 refills | Status: DC
  Filled 2021-06-16: qty 1, 1d supply, fill #0

## 2021-06-16 MED ORDER — AMLODIPINE BESYLATE 10 MG PO TABS
10.0000 mg | ORAL_TABLET | Freq: Every day | ORAL | 1 refills | Status: DC
  Filled 2021-06-16: qty 90, 90d supply, fill #0
  Filled 2021-10-15: qty 30, 30d supply, fill #0

## 2021-06-16 MED ORDER — METOPROLOL SUCCINATE ER 50 MG PO TB24
50.0000 mg | ORAL_TABLET | Freq: Every day | ORAL | 1 refills | Status: AC
  Filled 2021-06-16: qty 90, 90d supply, fill #0
  Filled 2021-09-23: qty 30, 30d supply, fill #1

## 2021-06-16 MED ORDER — RIGHTEST GM550 BLOOD GLUCOSE W/DEVICE KIT
PACK | 0 refills | Status: AC
  Filled 2021-06-16: qty 1, 30d supply, fill #0

## 2021-06-16 NOTE — Progress Notes (Signed)
Established Patient Office Visit  Subjective:  Patient ID: [name], female    DOB: May 14, 1976  Age: 46 y.o. MRN: [id number]  CC:  Chief Complaint  Patient presents with   Follow-up    Pt does not check her blood sugar at home because her machine is broken. Pts cough is better. Pt reports blurry vision in left eye but missed [facility] eye appt. Left arm pain from dialysis. Pt reports continued vaginal itchiness and pain and discharge.     HPI [name]   is a 46 year old female who history of Asthma,T2 Diabetes, Hypertension, Renal disorder on hemodialysis presents for lab review. Her HgbA1c done on 06/10/21 decreased from 8.2% to 7.9%, she states that she's compliant with her medications, denies side effects and continues to make healthy lifestyle changes. She states that her glucometer is faulty and has not checked her blood glucose in 3 weeks. She denies hypo/hyperglycemic symptoms, Peripheral neuropathy. Her blood glucose was  170 mg/dl. She has right chest perm cath and left upper arm AV fistula with thrill and bruit or hemodialysis M,W,F. She denies any pain to her left upper arm. She was seen at [facility] ED on 05/25/21 for epigastric pain. She completed antibiotic course for urinary tract infection and c/o  pruritic whitish vaginal discharge that has being going on for 2 weeks. Currently, she states that her pain has resolved. Overall, she states that she's doing well and offers no further complaint.  Past Medical History:  Diagnosis Date   Anemia    Asthma    well controlled   Complication of anesthesia    "takes alot for anesthesia to work"   Concussion    h/o   Depression    Diabetes ([place])    last A1c was 5.8 on 09-04-20   Dialysis patient ([place])    T, Th, and Sat   Family history of adverse reaction to anesthesia    son-became aggressive after surgery   GERD (gastroesophageal reflux disease)    occ no meds   Headache    migraines   Hypertension    Palpitations     Pneumonia    h/o   Renal disorder     Past Surgical History:  Procedure Laterality Date   AV FISTULA PLACEMENT Left 11/20/2020   Procedure: ARTERIOVENOUS (AV) FISTULA CREATION ( BRACHIAL CEPHALIC );  Surgeon: Katha Cabal, MD;  Location: [facility] ORS;  Service: Vascular;  Laterality: Left;   CESAREAN SECTION N/A    x3   CHOLECYSTECTOMY     DIALYSIS/PERMA CATHETER INSERTION N/A 08/24/2020   Procedure: DIALYSIS/PERMA CATHETER INSERTION;  Surgeon: Algernon Huxley, MD;  Location: [facility];  Service: Cardiovascular;  Laterality: N/A;    Family History  Problem Relation Age of Onset   Diabetes Mother    Skin cancer Mother    Heart attack Father        died at ~32 years of age   Other Sister        hit by a motorcycle   Alcohol abuse Brother    Diabetes Brother    HIV Brother     Social History   Socioeconomic History   Marital status: Married    Spouse name: Not on file   Number of children: Not on file   Years of education: Not on file   Highest education level: Not on file  Occupational History   Not on file  Tobacco Use   Smoking status: Never  Smokeless tobacco: Never  Vaping Use   Vaping Use: Never used  Substance and Sexual Activity   Alcohol use: Never   Drug use: Never   Sexual activity: Not on file  Other Topics Concern   Not on file  Social History Narrative   Not on file   Social Determinants of Health   Financial Resource Strain: Not on file  Food Insecurity: Food Insecurity Present   Worried About [place] in the Last Year: Often true   Arboriculturist in the Last Year: Often true  Transportation Needs: Public librarian (Medical): Yes   Lack of Transportation (Non-Medical): Yes  Physical Activity: Not on file  Stress: Not on file  Social Connections: Not on file  Intimate Partner Violence: Not on file    Outpatient Medications Prior to Visit  Medication Sig Dispense Refill    acetaminophen (TYLENOL) 500 MG tablet Take 500-1,000 mg by mouth every 6 (six) hours as needed (pain). Takes 3 times per week on dialysis days     Insulin Glargine (BASAGLAR KWIKPEN) 100 UNIT/ML Inject 14 Units into the skin once daily at bedtime. 15 mL 3   omeprazole (PRILOSEC) 20 MG capsule Take 1 capsule (20 mg total) by mouth once daily. 30 capsule 3   amLODipine (NORVASC) 10 MG tablet Take 1 tablet (10 mg total) by mouth once daily. 30 tablet 2   metoprolol succinate (TOPROL-XL) 50 MG 24 hr tablet Take 1 tablet (50 mg total) by mouth once daily. Take with or immediately following a meal. 30 tablet 3   Blood Glucose Monitoring Suppl (RIGHTEST GM550 BLOOD GLUCOSE) w/Device KIT USE AS DIRECTED UP TO FOUR TIMES DAILY. 1 kit 0   Blood Pressure KIT USE AS DIRECTED. 1 kit 0   CALCITRIOL IV Inject 2 mcg into the vein 3 (three) times a week.     calcium acetate (PHOSLO) 667 MG tablet Take 1,334 mg by mouth 3 (three) times daily. (Patient not taking: No sig reported)     CINNAMON PO Take by mouth. Patient taking 1 cup of cinnamon tea     diphenhydrAMINE (BENADRYL) 25 MG tablet Take 25 mg by mouth every 6 (six) hours as needed. Taking as needed     epoetin alfa (EPOGEN) 10000 UNIT/ML injection Inject 1,400 Units into the vein 3 (three) times a week.     glucose blood (RIGHTEST GS550 BLOOD GLUCOSE) test strip USE AS DIRECTED UP TO FOUR TIMES DAILY. (Patient not taking: Reported on 06/16/2021) 100 strip 0   Insulin Pen Needle 32G X 4 MM MISC USE AS DIRECTED ONCE DAILY AT BEDTIME. 100 each 2   Iron Sucrose (VENOFER IV) Inject 100 mg into the vein 3 (three) times a week.     lidocaine-prilocaine (EMLA) cream Apply 1 application topically to the affected area(s) daily as needed. 30 g 4   Rightest GL300 Lancets MISC USE AS DIRECTED UP TO FOUR TIMES DAILY. 100 each 0   sevelamer carbonate (RENVELA) 800 MG tablet TAKE ONE TABLET BY MOUTH 3 TIMES DAILY WITH MEALS. 90 tablet 5   azithromycin (ZITHROMAX) 250 MG  tablet TAKE 2 TABLETS BY MOUTH ONCE DAILY ON DAY 1. THEN TAKE 1 TABLET BY MOUTH ONCE DAILY. 6 tablet 0   ciprofloxacin (CIPRO) 500 MG tablet TAKE ONE TABLET BY MOUTH ONCE DAILY FOR 3 DAYS. 3 tablet 0   fluconazole (DIFLUCAN) 150 MG tablet TAKE 1 TABLET BY MOUTH ONCE FOR A SINGLE  DOSE. MAY REPEAT IN 2 DAYS IF SYMPTOMS PERSIST. 2 tablet 0   No facility-administered medications prior to visit.    Allergies  Allergen Reactions   Statins     Other reaction(s): Other (See Comments) "redness and facial swelling"   Cholesterol Rash    Cholesterol medicine    ROS Review of Systems  Constitutional: Negative.   Eyes: Negative.   Respiratory: Negative.    Cardiovascular: Negative.   Gastrointestinal: Negative.   Genitourinary:  Positive for vaginal discharge. Negative for dysuria, flank pain, frequency, pelvic pain, urgency and vaginal pain.  Skin: Negative.   Neurological: Negative.   Psychiatric/Behavioral: Negative.       Objective:    Physical Exam HENT:     Head: Normocephalic and atraumatic.     Mouth/Throat:     Mouth: Mucous membranes are moist.  Eyes:     Extraocular Movements: Extraocular movements intact.     Conjunctiva/sclera: Conjunctivae normal.     Pupils: Pupils are equal, round, and reactive to light.  Cardiovascular:     Rate and Rhythm: Normal rate and regular rhythm.     Pulses: Normal pulses.     Heart sounds: Normal heart sounds.  Pulmonary:     Effort: Pulmonary effort is normal.     Breath sounds: Normal breath sounds.  Abdominal:     General: Bowel sounds are normal.     Palpations: Abdomen is soft.  Skin:    General: Skin is warm.  Neurological:     General: No focal deficit present.     Mental Status: She is alert and oriented to person, place, and time. Mental status is at baseline.  Psychiatric:        Mood and Affect: Mood normal.        Behavior: Behavior normal.        Thought Content: Thought content normal.        Judgment: Judgment  normal.    BP (!) 142/82 (BP Location: Right Arm, Patient Position: Sitting, Cuff Size: Large)    Pulse 75    Ht '5\' 1"'  (1.549 m)    Wt 278 lb 4.8 oz (126.2 kg)    BMI 52.58 kg/m  Wt Readings from Last 3 Encounters:  06/16/21 278 lb 4.8 oz (126.2 kg)  04/15/21 269 lb 1.6 oz (122.1 kg)  03/25/21 264 lb 9.6 oz (120 kg)     Health Maintenance Due  Topic Date Due   COVID-19 Vaccine (1) Never done   OPHTHALMOLOGY EXAM  Never done   Hepatitis C Screening  Never done   PAP SMEAR-Modifier  Never done   COLONOSCOPY (Pts 45-40yr Insurance coverage will need to be confirmed)  Never done    There are no preventive care reminders to display for this patient.  Lab Results  Component Value Date   TSH 3.921 08/29/2020   Lab Results  Component Value Date   WBC 7.1 01/05/2021   HGB 9.8 (L) 01/05/2021   HCT 28.8 (L) 01/05/2021   MCV 88.3 01/05/2021   PLT 270 01/05/2021   Lab Results  Component Value Date   NA 134 (L) 01/05/2021   K 4.3 01/05/2021   CO2 29 01/05/2021   GLUCOSE 165 (H) 01/05/2021   BUN 24 (H) 01/05/2021   CREATININE 5.78 (H) 01/05/2021   BILITOT 0.5 01/03/2021   ALKPHOS 101 01/03/2021   AST 16 01/03/2021   ALT 11 01/03/2021   PROT 6.7 01/03/2021   ALBUMIN 3.4 (L) 01/03/2021  CALCIUM 8.2 (L) 01/05/2021   ANIONGAP 9 01/05/2021   Lab Results  Component Value Date   CHOL 183 03/10/2021   Lab Results  Component Value Date   HDL 46 03/10/2021   Lab Results  Component Value Date   LDLCALC 109 (H) 03/10/2021   Lab Results  Component Value Date   TRIG 162 (H) 03/10/2021   Lab Results  Component Value Date   CHOLHDL 4.0 03/10/2021   Lab Results  Component Value Date   HGBA1C 7.9 (H) 06/10/2021      Assessment & Plan:     1. Type 2 diabetes mellitus with chronic kidney disease on chronic dialysis, without long-term current use of insulin (HCC) - Her HgbA1c was 7.9%, her goal should be less than 7%. She was advised to check her blood glucose bid,  record and bring log to follow up appointment. Her fasting blood glucose goal should be between 80-130 mg/dl. She was encouraged to continue on low carb/non concentrated sweet diet and exercise as tolerated. She will be provided with a new Glucometer. - POCT Glucose (CBG); Future - Blood Glucose Monitoring Suppl (RIGHTEST GM550 BLOOD GLUCOSE) w/Device KIT; Dispense based on patient and insurance preference. Use up to four times daily as directed. (FOR ICD-9 250.00, 250.01).  Dispense: 1 kit; Refill: 0 - POCT Glucose (CBG)  2. Essential hypertension - Her blood pressure is not under control, her goal should be less than 130/80. She will continue on current medication , Hemodialysis, DASH diet and exercise as tolerated. - amLODipine (NORVASC) 10 MG tablet; Take 1 tablet (10 mg total) by mouth once daily.  Dispense: 90 tablet; Refill: 1 - metoprolol succinate (TOPROL-XL) 50 MG 24 hr tablet; Take 1 tablet (50 mg total) by mouth once daily. Take with or immediately following a meal.  Dispense: 90 tablet; Refill: 1  3. Antibiotic-induced yeast infection - She will start Fluconazole 150 mg, educated on medication side effects and advised to notify clinic. - fluconazole (DIFLUCAN) 150 MG tablet; Take 1 tablet (150 mg total) by mouth daily.  Dispense: 1 tablet; Refill: 0   Follow-up: Return in about 3 months (around 09/14/2021), or if symptoms worsen or fail to improve.    Jovee Dettinger Jerold Coombe, NP

## 2021-06-16 NOTE — Patient Instructions (Signed)
Heart-Healthy Eating Plan Many factors influence your heart (coronary) health, including eating and exercise habits. Coronary risk increases with abnormal blood fat (lipid) levels. Heart-healthy meal planning includes limiting unhealthy fats, increasing healthy fats, and making other diet and lifestyle changes. What is my plan? Your health care provider may recommend that you: Limit your fat intake to _________% or less of your total calories each day. Limit your saturated fat intake to _________% or less of your total calories each day. Limit the amount of cholesterol in your diet to less than _________ mg per day. What are tips for following this plan? Cooking Cook foods using methods other than frying. Baking, boiling, grilling, and broiling are all good options. Other ways to reduce fat include: Removing the skin from poultry. Removing all visible fats from meats. Steaming vegetables in water or broth. Meal planning  At meals, imagine dividing your plate into fourths: Fill one-half of your plate with vegetables and green salads. Fill one-fourth of your plate with whole grains. Fill one-fourth of your plate with lean protein foods. Eat 4-5 servings of vegetables per day. One serving equals 1 cup raw or cooked vegetable, or 2 cups raw leafy greens. Eat 4-5 servings of fruit per day. One serving equals 1 medium whole fruit,  cup dried fruit,  cup fresh, frozen, or canned fruit, or  cup 100% fruit juice. Eat more foods that contain soluble fiber. Examples include apples, broccoli, carrots, beans, peas, and barley. Aim to get 25-30 g of fiber per day. Increase your consumption of legumes, nuts, and seeds to 4-5 servings per week. One serving of dried beans or legumes equals  cup cooked, 1 serving of nuts is  cup, and 1 serving of seeds equals 1 tablespoon. Fats Choose healthy fats more often. Choose monounsaturated and polyunsaturated fats, such as olive and canola oils, flaxseeds,  walnuts, almonds, and seeds. Eat more omega-3 fats. Choose salmon, mackerel, sardines, tuna, flaxseed oil, and ground flaxseeds. Aim to eat fish at least 2 times each week. Check food labels carefully to identify foods with trans fats or high amounts of saturated fat. Limit saturated fats. These are found in animal products, such as meats, butter, and cream. Plant sources of saturated fats include palm oil, palm kernel oil, and coconut oil. Avoid foods with partially hydrogenated oils in them. These contain trans fats. Examples are stick margarine, some tub margarines, cookies, crackers, and other baked goods. Avoid fried foods. General information Eat more home-cooked food and less restaurant, buffet, and fast food. Limit or avoid alcohol. Limit foods that are high in starch and sugar. Lose weight if you are overweight. Losing just 5-10% of your body weight can help your overall health and prevent diseases such as diabetes and heart disease. Monitor your salt (sodium) intake, especially if you have high blood pressure. Talk with your health care provider about your sodium intake. Try to incorporate more vegetarian meals weekly. What foods can I eat? Fruits All fresh, canned (in natural juice), or frozen fruits. Vegetables Fresh or frozen vegetables (raw, steamed, roasted, or grilled). Green salads. Grains Most grains. Choose whole wheat and whole grains most of the time. Rice and pasta, including brown rice and pastas made with whole wheat. Meats and other proteins Lean, well-trimmed beef, veal, pork, and lamb. Chicken and turkey without skin. All fish and shellfish. Wild duck, rabbit, pheasant, and venison. Egg whites or low-cholesterol egg substitutes. Dried beans, peas, lentils, and tofu. Seeds and most nuts. Dairy Low-fat or nonfat cheeses,   including ricotta and mozzarella. Skim or 1% milk (liquid, powdered, or evaporated). Buttermilk made with low-fat milk. Nonfat or low-fat  yogurt. Fats and oils Non-hydrogenated (trans-free) margarines. Vegetable oils, including soybean, sesame, sunflower, olive, peanut, safflower, corn, canola, and cottonseed. Salad dressings or mayonnaise made with a vegetable oil. Beverages Water (mineral or sparkling). Coffee and tea. Diet carbonated beverages. Sweets and desserts Sherbet, gelatin, and fruit ice. Small amounts of dark chocolate. Limit all sweets and desserts. Seasonings and condiments All seasonings and condiments. The items listed above may not be a complete list of foods and beverages you can eat. Contact a dietitian for more options. What foods are not recommended? Fruits Canned fruit in heavy syrup. Fruit in cream or butter sauce. Fried fruit. Limit coconut. Vegetables Vegetables cooked in cheese, cream, or butter sauce. Fried vegetables. Grains Breads made with saturated or trans fats, oils, or whole milk. Croissants. Sweet rolls. Donuts. High-fat crackers, such as cheese crackers. Meats and other proteins Fatty meats, such as hot dogs, ribs, sausage, bacon, rib-eye roast or steak. High-fat deli meats, such as salami and bologna. Caviar. Domestic duck and goose. Organ meats, such as liver. Dairy Cream, sour cream, cream cheese, and creamed cottage cheese. Whole-milk cheeses. Whole or 2% milk (liquid, evaporated, or condensed). Whole buttermilk. Cream sauce or high-fat cheese sauce. Whole-milk yogurt. Fats and oils Meat fat, or shortening. Cocoa butter, hydrogenated oils, palm oil, coconut oil, palm kernel oil. Solid fats and shortenings, including bacon fat, salt pork, lard, and butter. Nondairy cream substitutes. Salad dressings with cheese or sour cream. Beverages Regular sodas and any drinks with added sugar. Sweets and desserts Frosting. Pudding. Cookies. Cakes. Pies. Milk chocolate or white chocolate. Buttered syrups. Full-fat ice cream or ice cream drinks. The items listed above may not be a complete list of  foods and beverages to avoid. Contact a dietitian for more information. Summary Heart-healthy meal planning includes limiting unhealthy fats, increasing healthy fats, and making other diet and lifestyle changes. Lose weight if you are overweight. Losing just 5-10% of your body weight can help your overall health and prevent diseases such as diabetes and heart disease. Focus on eating a balance of foods, including fruits and vegetables, low-fat or nonfat dairy, lean protein, nuts and legumes, whole grains, and heart-healthy oils and fats. This information is not intended to replace advice given to you by your health care provider. Make sure you discuss any questions you have with your health care provider. Document Revised: 09/10/2020 Document Reviewed: 09/10/2020 Elsevier Patient Education  2022 [place] for Diabetes Mellitus, Adult Carbohydrate counting is a method of keeping track of how many carbohydrates you eat. Eating carbohydrates increases the amount of sugar (glucose) in the blood. Counting how many carbohydrates you eat improves how well you manage your blood glucose. This, in turn, helps you manage your diabetes. Carbohydrates are measured in grams (g) per serving. It is important to know how many carbohydrates (in grams or by serving size) you can have in each meal. This is different for every person. A dietitian can help you make a meal plan and calculate how many carbohydrates you should have at each meal and snack. What foods contain carbohydrates? Carbohydrates are found in the following foods: Grains, such as breads and cereals. Dried beans and soy products. Starchy vegetables, such as potatoes, peas, and corn. Fruit and fruit juices. Milk and yogurt. Sweets and snack foods, such as cake, cookies, candy, chips, and soft drinks. How do I count carbohydrates  in foods? There are two ways to count carbohydrates in food. You can read food labels or learn  standard serving sizes of foods. You can use either of these methods or a combination of both. Using the Nutrition Facts label The Nutrition Facts list is included on the labels of almost all packaged foods and beverages in the [place]. It includes: The serving size. Information about nutrients in each serving, including the grams of carbohydrate per serving. To use the Nutrition Facts, decide how many servings you will have. Then, multiply the number of servings by the number of carbohydrates per serving. The resulting number is the total grams of carbohydrates that you will be having. Learning the standard serving sizes of foods When you eat carbohydrate foods that are not packaged or do not include Nutrition Facts on the label, you need to measure the servings in order to count the grams of carbohydrates. Measure the foods that you will eat with a food scale or measuring cup, if needed. Decide how many standard-size servings you will eat. Multiply the number of servings by 15. For foods that contain carbohydrates, one serving equals 15 g of carbohydrates. For example, if you eat 2 cups or 10 oz (300 g) of strawberries, you will have eaten 2 servings and 30 g of carbohydrates (2 servings x 15 g = 30 g). For foods that have more than one food mixed, such as soups and casseroles, you must count the carbohydrates in each food that is included. The following list contains standard serving sizes of common carbohydrate-rich foods. Each of these servings has about 15 g of carbohydrates: 1 slice of bread. 1 six-inch (15 cm) tortilla. ? cup or 2 oz (53 g) cooked rice or pasta.  cup or 3 oz (85 g) cooked or canned, drained and rinsed beans or lentils.  cup or 3 oz (85 g) starchy vegetable, such as peas, corn, or squash.  cup or 4 oz (120 g) hot cereal.  cup or 3 oz (85 g) boiled or mashed potatoes, or  or 3 oz (85 g) of a large baked potato.  cup or 4 fl oz (118 mL) fruit juice. 1 cup or 8  fl oz (237 mL) milk. 1 small or 4 oz (106 g) apple.  or 2 oz (63 g) of a medium banana. 1 cup or 5 oz (150 g) strawberries. 3 cups or 1 oz (28.3 g) popped popcorn. What is an example of carbohydrate counting? To calculate the grams of carbohydrates in this sample meal, follow the steps shown below. Sample meal 3 oz (85 g) chicken breast. ? cup or 4 oz (106 g) brown rice.  cup or 3 oz (85 g) corn. 1 cup or 8 fl oz (237 mL) milk. 1 cup or 5 oz (150 g) strawberries with sugar-free whipped topping. Carbohydrate calculation Identify the foods that contain carbohydrates: Rice. Corn. Milk. Strawberries. Calculate how many servings you have of each food: 2 servings rice. 1 serving corn. 1 serving milk. 1 serving strawberries. Multiply each number of servings by 15 g: 2 servings rice x 15 g = 30 g. 1 serving corn x 15 g = 15 g. 1 serving milk x 15 g = 15 g. 1 serving strawberries x 15 g = 15 g. Add together all of the amounts to find the total grams of carbohydrates eaten: 30 g + 15 g + 15 g + 15 g = 75 g of carbohydrates total. What are tips for following this plan?  Shopping Develop a meal plan and then make a shopping list. Buy fresh and frozen vegetables, fresh and frozen fruit, dairy, eggs, beans, lentils, and whole grains. Look at food labels. Choose foods that have more fiber and less sugar. Avoid processed foods and foods with added sugars. Meal planning Aim to have the same number of grams of carbohydrates at each meal and for each snack time. Plan to have regular, balanced meals and snacks. Where to find more information American Diabetes Association: diabetes.org Centers for Disease Control and Prevention: StoreMirror.com.[place] Academy of Nutrition and Dietetics: eatright.org Association of Diabetes Care & Education Specialists: diabeteseducator.org Summary Carbohydrate counting is a method of keeping track of how many carbohydrates you eat. Eating carbohydrates increases the  amount of sugar (glucose) in your blood. Counting how many carbohydrates you eat improves how well you manage your blood glucose. This helps you manage your diabetes. A dietitian can help you make a meal plan and calculate how many carbohydrates you should have at each meal and snack. This information is not intended to replace advice given to you by your health care provider. Make sure you discuss any questions you have with your health care provider. Document Revised: 12/04/2019 Document Reviewed: 12/04/2019 Elsevier Patient Education  [place].

## 2021-06-18 ENCOUNTER — Other Ambulatory Visit: Payer: Self-pay

## 2021-06-18 MED ORDER — FUROSEMIDE 80 MG PO TABS
ORAL_TABLET | ORAL | 11 refills | Status: DC
  Filled 2021-06-18: qty 60, 30d supply, fill #0

## 2021-06-21 ENCOUNTER — Other Ambulatory Visit: Payer: Self-pay

## 2021-06-21 ENCOUNTER — Telehealth (INDEPENDENT_AMBULATORY_CARE_PROVIDER_SITE_OTHER): Payer: Self-pay

## 2021-06-21 NOTE — Telephone Encounter (Signed)
Spoke with the patient and she is scheduled with Dr. Delana Meyer for a left arm fistulagram on 06/22/21 with a 1:30 pm arrival time to the [facility]. Pre-procedure instructions were discussed and patient undertstood.

## 2021-06-22 ENCOUNTER — Ambulatory Visit
Admission: RE | Admit: 2021-06-22 | Discharge: 2021-06-22 | Disposition: A | Discharge status: Home / Self Care | Payer: Medicaid Other | Attending: Vascular Surgery | Admitting: Vascular Surgery

## 2021-06-22 ENCOUNTER — Encounter
Admission: RE | Disposition: A | Discharge status: Home / Self Care | Payer: Self-pay | Source: Home / Self Care | Attending: Vascular Surgery

## 2021-06-22 ENCOUNTER — Other Ambulatory Visit (INDEPENDENT_AMBULATORY_CARE_PROVIDER_SITE_OTHER): Payer: Self-pay | Admitting: Nurse Practitioner

## 2021-06-22 ENCOUNTER — Other Ambulatory Visit: Payer: Self-pay

## 2021-06-22 ENCOUNTER — Encounter: Payer: Self-pay | Admitting: Vascular Surgery

## 2021-06-22 DIAGNOSIS — T82858A Stenosis of vascular prosthetic devices, implants and grafts, initial encounter: Secondary | ICD-10-CM | POA: Insufficient documentation

## 2021-06-22 DIAGNOSIS — N186 End stage renal disease: Secondary | ICD-10-CM

## 2021-06-22 DIAGNOSIS — E1122 Type 2 diabetes mellitus with diabetic chronic kidney disease: Secondary | ICD-10-CM | POA: Insufficient documentation

## 2021-06-22 DIAGNOSIS — T82898A Other specified complication of vascular prosthetic devices, implants and grafts, initial encounter: Secondary | ICD-10-CM

## 2021-06-22 DIAGNOSIS — N185 Chronic kidney disease, stage 5: Secondary | ICD-10-CM

## 2021-06-22 DIAGNOSIS — Z992 Dependence on renal dialysis: Secondary | ICD-10-CM

## 2021-06-22 DIAGNOSIS — Y841 Kidney dialysis as the cause of abnormal reaction of the patient, or of later complication, without mention of misadventure at the time of the procedure: Secondary | ICD-10-CM | POA: Insufficient documentation

## 2021-06-22 DIAGNOSIS — Z794 Long term (current) use of insulin: Secondary | ICD-10-CM | POA: Insufficient documentation

## 2021-06-22 DIAGNOSIS — I120 Hypertensive chronic kidney disease with stage 5 chronic kidney disease or end stage renal disease: Secondary | ICD-10-CM | POA: Insufficient documentation

## 2021-06-22 HISTORY — PX: A/V FISTULAGRAM: CATH118298

## 2021-06-22 LAB — POTASSIUM (ARMC VASCULAR LAB ONLY): Potassium (ARMC vascular lab): 5.4 — ABNORMAL HIGH (ref 3.5–5.1)

## 2021-06-22 LAB — GLUCOSE, CAPILLARY: Glucose-Capillary: 140 mg/dL — ABNORMAL HIGH (ref 70–99)

## 2021-06-22 SURGERY — A/V FISTULAGRAM
Anesthesia: Moderate Sedation | Laterality: Left

## 2021-06-22 MED ORDER — IODIXANOL 320 MG/ML IV SOLN
INTRAVENOUS | Status: DC | PRN
  Administered 2021-06-22: 25 mL via INTRA_ARTERIAL

## 2021-06-22 MED ORDER — MIDAZOLAM HCL 2 MG/2ML IJ SOLN
INTRAMUSCULAR | Status: AC
  Filled 2021-06-22: qty 4

## 2021-06-22 MED ORDER — MIDAZOLAM HCL 2 MG/ML PO SYRP: 8.0000 mg | ORAL_SOLUTION | Freq: Once | ORAL | Status: DC | PRN

## 2021-06-22 MED ORDER — ONDANSETRON HCL 4 MG/2ML IJ SOLN: 4.0000 mg | Freq: Four times a day (QID) | INTRAMUSCULAR | Status: DC | PRN

## 2021-06-22 MED ORDER — CEFAZOLIN SODIUM-DEXTROSE 1-4 GM/50ML-% IV SOLN
1.0000 g | Freq: Once | INTRAVENOUS | Status: AC
  Administered 2021-06-22: 1 g via INTRAVENOUS

## 2021-06-22 MED ORDER — FENTANYL CITRATE PF 50 MCG/ML IJ SOSY
PREFILLED_SYRINGE | INTRAMUSCULAR | Status: AC
  Filled 2021-06-22: qty 1

## 2021-06-22 MED ORDER — ACETAMINOPHEN 325 MG PO TABS
ORAL_TABLET | ORAL | Status: AC
  Filled 2021-06-22: qty 2

## 2021-06-22 MED ORDER — FAMOTIDINE 20 MG PO TABS: 40.0000 mg | ORAL_TABLET | Freq: Once | ORAL | Status: DC | PRN

## 2021-06-22 MED ORDER — MIDAZOLAM HCL 2 MG/2ML IJ SOLN
INTRAMUSCULAR | Status: DC | PRN
  Administered 2021-06-22 (×2): .5 mg via INTRAVENOUS
  Administered 2021-06-22 (×2): 1 mg via INTRAVENOUS

## 2021-06-22 MED ORDER — FENTANYL CITRATE (PF) 100 MCG/2ML IJ SOLN
INTRAMUSCULAR | Status: DC | PRN
  Administered 2021-06-22 (×3): 25 ug via INTRAVENOUS
  Administered 2021-06-22: 50 ug via INTRAVENOUS

## 2021-06-22 MED ORDER — ACETAMINOPHEN 325 MG PO TABS
650.0000 mg | ORAL_TABLET | Freq: Once | ORAL | Status: AC
  Administered 2021-06-22: 650 mg via ORAL

## 2021-06-22 MED ORDER — HEPARIN SODIUM (PORCINE) 1000 UNIT/ML IJ SOLN
INTRAMUSCULAR | Status: DC | PRN
  Administered 2021-06-22: 4000 [IU] via INTRAVENOUS

## 2021-06-22 MED ORDER — METHYLPREDNISOLONE SODIUM SUCC 125 MG IJ SOLR: 125.0000 mg | Freq: Once | INTRAMUSCULAR | Status: DC | PRN

## 2021-06-22 MED ORDER — SODIUM CHLORIDE 0.9 % IV SOLN: INTRAVENOUS | Status: DC

## 2021-06-22 MED ORDER — FENTANYL CITRATE (PF) 100 MCG/2ML IJ SOLN
INTRAMUSCULAR | Status: AC
  Filled 2021-06-22: qty 2

## 2021-06-22 MED ORDER — HYDROMORPHONE HCL 1 MG/ML IJ SOLN: 1.0000 mg | Freq: Once | INTRAMUSCULAR | Status: DC | PRN

## 2021-06-22 MED ORDER — DIPHENHYDRAMINE HCL 50 MG/ML IJ SOLN: 50.0000 mg | Freq: Once | INTRAMUSCULAR | Status: DC | PRN

## 2021-06-22 SURGICAL SUPPLY — 23 items
BALLN LUTONIX AV 6X60X75 (BALLOONS) ×2
BALLN LUTONIX AV 7X60X75 (BALLOONS) ×2
BALLN ULTRVRSE 8X40X75C (BALLOONS) ×2
BALLOON LUTONIX AV 6X60X75 (BALLOONS) IMPLANT
BALLOON LUTONIX AV 7X60X75 (BALLOONS) IMPLANT
BALLOON ULTRVRSE 8X40X75C (BALLOONS) IMPLANT
CANNULA 5F STIFF (CANNULA) ×1 IMPLANT
CATH BEACON 5 .035 40 KMP TP (CATHETERS) IMPLANT
CATH BEACON 5 .038 40 KMP TP (CATHETERS) ×1
CATH MICROCATH PRGRT 2.8F 110 (CATHETERS) IMPLANT
COIL 400 COMPLEX SOFT 6X20CM (Vascular Products) ×1 IMPLANT
COIL 400 COMPLEX SOFT 6X30CM (Vascular Products) ×1 IMPLANT
COVER PROBE U/S 5X48 (MISCELLANEOUS) ×1 IMPLANT
DEVICE TORQUE .025-.038 (MISCELLANEOUS) ×1 IMPLANT
DRAPE BRACHIAL (DRAPES) ×1 IMPLANT
GUIDEWIRE ANGLED .035 180CM (WIRE) ×1 IMPLANT
GUIDEWIRE VERSACORE 175CM (WIRE) ×1 IMPLANT
HANDLE DETACHMENT COIL (MISCELLANEOUS) ×1 IMPLANT
KIT ENCORE 26 ADVANTAGE (KITS) ×1 IMPLANT
MICROCATH PROGREAT 2.8F 110 CM (CATHETERS) ×2
PACK ANGIOGRAPHY (CUSTOM PROCEDURE TRAY) ×2 IMPLANT
SHEATH BRITE TIP 6FRX5.5 (SHEATH) ×1 IMPLANT
SUT MNCRL AB 4-0 PS2 18 (SUTURE) ×1 IMPLANT

## 2021-06-22 NOTE — Interval H&P Note (Signed)
History and Physical Interval Note:  06/22/2021 2:03 PM  [name]  has presented today for surgery, with the diagnosis of LT Arm Fistulagram   ESRD.  The various methods of treatment have been discussed with the patient and family. After consideration of risks, benefits and other options for treatment, the patient has consented to  Procedure(s): A/V FISTULAGRAM (Left) as a surgical intervention.  The patient's history has been reviewed, patient examined, no change in status, stable for surgery.  I have reviewed the patient's chart and labs.  Questions were answered to the patient's satisfaction.     [name]

## 2021-06-22 NOTE — Op Note (Signed)
OPERATIVE NOTE   PROCEDURE: Contrast injection left brachiocephalic AV access Percutaneous transluminal angioplasty left brachiocephalic fistula in 2 locations. Coil embolization large venous tributary of the cephalic vein  PRE-OPERATIVE DIAGNOSIS: Complication of dialysis access                                                       End Stage Renal Disease  POST-OPERATIVE DIAGNOSIS: same as above   SURGEON: Katha Cabal, M.D.  ANESTHESIA: Conscious sedation was administered under my direct supervision by the interventional radiology RN. IV Versed plus fentanyl were utilized. Continuous ECG, pulse oximetry and blood pressure was monitored throughout the entire procedure.  Conscious sedation was for a total of 53 minutes.  ESTIMATED BLOOD LOSS: minimal  FINDING(S): Stricture of the AV graft  SPECIMEN(S):  None  CONTRAST: 25 cc  FLUOROSCOPY TIME: 5.3 minutes  INDICATIONS: [name] is a 46 y.o. female who  presents with malfunctioning left arm AV access.  The patient is scheduled for angiography with possible intervention of the AV access.  The patient is aware the risks include but are not limited to: bleeding, infection, thrombosis of the cannulated access, and possible anaphylactic reaction to the contrast.  The patient acknowledges if the access can not be salvaged a tunneled catheter will be needed and will be placed during this procedure.  The patient is aware of the risks of the procedure and elects to proceed with the angiogram and intervention.  DESCRIPTION: After full informed written consent was obtained, the patient was brought back to the Special Procedure suite and placed supine position.  Appropriate cardiopulmonary monitors were placed.  The left arm was prepped and draped in the standard fashion.  Appropriate timeout is called. The left brachiocephalic fistula was cannulated with a micropuncture needle.  Cannulation was performed with ultrasound guidance.  Ultrasound was placed in a sterile sleeve, the AV access was interrogated and noted to be echolucent and compressible indicating patency. Image was recorded for the permanent record. The puncture is performed under continuous ultrasound visualization.   The microwire was advanced and the needle was exchanged for  a microsheath.  The J-wire was then advanced and a 6 Fr sheath inserted.  Hand injections were completed to image the access from the arterial anastomosis through the entire access.  The central venous structures were also imaged by hand injections.  Interpretation: The fistula demonstrates 2 greater than 80% strictures within its course it is otherwise 12+ millimeters in diameter.  There is also a very tortuous area in the mid upper arm which does a 360 degree loop.  This is located between the 2 structures.  The arterial anastomosis is widely patent.  There are also 3 sizable tributaries involved with the main vein of the fistula.  The tributary closest to the elbow is the largest and also rapidly fills retrograde across the antecubital fossa and then fills the deep system.  This 1 appears to be the most significant.  Based on the images,  3000 units of heparin was given and a wire was negotiated.  Using a glide wire and a Kumpe catheter the tributary described above was selected after confirming location by hand-injection through the Kumpe catheter a prograde catheter was advanced and then to 6 mm Ruby coils were deployed a 6 x 20 and then a 6  x 30.  Follow-up imaging demonstrated an excellent result with near cessation of flow.  Attention was then turned to the 2 strictures.  The Kumpe catheter was repositioned into the fistula and then the wire catheter were negotiated proximally across both lesions.  The more distal stricture near the antecubital fossa was initially treated with a 6 mm Lutonix balloon inflated to 10 atm for 1 minute.  A 7 mm Lutonix balloon was then used to treat the lesion  that was located more proximal.  Again inflation was to 10 atm for 1 minute.  Follow-up imaging demonstrated minimal change in both lesions and I therefore reintroduced the 7 mm balloon and treated the stricture near the antecubital fossa.  Follow-up imaging demonstrated a marked improvement with 30-40% residual stenosis.  Before this lesion the fistula is now pulsatile and therefore an 8 mm x 40 mm Ultraverse balloon is introduced across this lesion and inflated slowly to 7 atm for 1 minute.  It is then deflated repositioned across the more proximal lesion and again inflated to 10 atm for 1 minute.  Follow-up imaging now demonstrates markedly improved flow with less than 10% residual stenosis.  Therefore, I elected to end the case.  A 4-0 Monocryl purse-string suture was sewn around the sheath.  The sheath was removed and light pressure was applied.  A sterile bandage was applied to the puncture site.    COMPLICATIONS: None  CONDITION: [name], M.D [facility] Office: [phone]  06/22/2021 3:07 PM

## 2021-06-22 NOTE — H&P (Signed)
@LOGO @   MRN : [id number]  [name] is a 46 y.o. ([birth year]) female who presents with chief complaint of problems wit my fistula.  History of Present Illness:    The patient is seen for evaluation of dialysis access.  The patient has a history of left brachiocephalic fistula creation in July 2022.  She is sent to [place] sooner than expected (the last time she was seen in the office was September 2022) secondary to problems with cannulation of her fistula.  Current access is via a catheter which is functioning adequately.  There have not been any episodes of catheter infection.  The patient denies amaurosis fugax or recent TIA symptoms. There are no recent neurological changes noted. The patient denies claudication symptoms or rest pain symptoms. The patient denies history of DVT, PE or superficial thrombophlebitis. The patient denies recent episodes of angina or shortness of breath.    Current Meds  Medication Sig   acetaminophen (TYLENOL) 500 MG tablet Take 500-1,000 mg by mouth every 6 (six) hours as needed (pain).   amLODipine (NORVASC) 10 MG tablet Take 1 tablet (10 mg total) by mouth once daily.   Biotin w/ Vitamins C & E (HAIR SKIN & NAILS GUMMIES PO) Take 2 tablets by mouth daily.   CALCITRIOL IV Inject 2 mcg into the vein 3 (three) times a week.   diphenhydrAMINE (BENADRYL) 25 MG tablet Take 25 mg by mouth daily as needed (before dialysis). Taking as needed   epoetin alfa (EPOGEN) 10000 UNIT/ML injection Inject 1,400 Units into the vein 3 (three) times a week.   Insulin Glargine (BASAGLAR KWIKPEN) 100 UNIT/ML Inject 14 Units into the skin once daily at bedtime. (Patient taking differently: Inject 12 Units into the skin at bedtime.)   Insulin Pen Needle 32G X 4 MM MISC USE AS DIRECTED ONCE DAILY AT BEDTIME.   Iron Sucrose (VENOFER IV) Inject 100 mg into the vein 3 (three) times a week.   omeprazole (PRILOSEC) 20 MG capsule Take 1 capsule (20 mg total) by mouth once daily.    sevelamer carbonate (RENVELA) 800 MG tablet TAKE ONE TABLET BY MOUTH 3 TIMES DAILY WITH MEALS. (Patient taking differently: Take 1,600 mg by mouth with breakfast, with lunch, and with evening meal.)    Past Medical History:  Diagnosis Date   Anemia    Asthma    well controlled   Complication of anesthesia    "takes alot for anesthesia to work"   Concussion    h/o   Depression    Diabetes ([place])    last A1c was 5.8 on 09-04-20   Dialysis patient ([place])    T, Th, and Sat   Family history of adverse reaction to anesthesia    son-became aggressive after surgery   GERD (gastroesophageal reflux disease)    occ no meds   Headache    migraines   Hypertension    Palpitations    Pneumonia    h/o   Renal disorder     Past Surgical History:  Procedure Laterality Date   AV FISTULA PLACEMENT Left 11/20/2020   Procedure: ARTERIOVENOUS (AV) FISTULA CREATION ( BRACHIAL CEPHALIC );  Surgeon: Katha Cabal, MD;  Location: [facility] ORS;  Service: Vascular;  Laterality: Left;   CESAREAN SECTION N/A    x3   CHOLECYSTECTOMY     DIALYSIS/PERMA CATHETER INSERTION N/A 08/24/2020   Procedure: DIALYSIS/PERMA CATHETER INSERTION;  Surgeon: Algernon Huxley, MD;  Location: [facility];  Service: Cardiovascular;  Laterality:  N/A;    Social History Social History   Tobacco Use   Smoking status: Never   Smokeless tobacco: Never  Vaping Use   Vaping Use: Never used  Substance Use Topics   Alcohol use: Never   Drug use: Never    Family History Family History  Problem Relation Age of Onset   Diabetes Mother    Skin cancer Mother    Heart attack Father        died at ~20 years of age   Other Sister        hit by a motorcycle   Alcohol abuse Brother    Diabetes Brother    HIV Brother     Allergies  Allergen Reactions   Statins Other (See Comments)    "redness and facial swelling"   Cholesterol Rash    Cholesterol medicine     REVIEW OF SYSTEMS (Negative unless  checked)  Constitutional: [] Weight loss  [] Fever  [] Chills Cardiac: [] Chest pain   [] Chest pressure   [] Palpitations   [] Shortness of breath when laying flat   [] Shortness of breath with exertion. Vascular:  [] Pain in legs with walking   [] Pain in legs at rest  [] History of DVT   [] Phlebitis   [] Swelling in legs   [] Varicose veins   [] Non-healing ulcers Pulmonary:   [] Uses home oxygen   [] Productive cough   [] Hemoptysis   [] Wheeze  [] COPD   [] Asthma Neurologic:  [] Dizziness   [] Seizures   [] History of stroke   [] History of TIA  [] Aphasia   [] Vissual changes   [] Weakness or numbness in arm   [] Weakness or numbness in leg Musculoskeletal:   [] Joint swelling   [] Joint pain   [] Low back pain Hematologic:  [] Easy bruising  [] Easy bleeding   [] Hypercoagulable state   [] Anemic Gastrointestinal:  [] Diarrhea   [] Vomiting  [] Gastroesophageal reflux/heartburn   [] Difficulty swallowing. Genitourinary:  [x] Chronic kidney disease   [] Difficult urination  [] Frequent urination   [] Blood in urine Skin:  [] Rashes   [] Ulcers  Psychological:  [] History of anxiety   []  History of major depression.  Physical Examination  Vitals:   06/22/21 1322  BP: 124/71  Pulse: 69  Resp: 18  Temp: 98 F (36.7 C)  TempSrc: Oral  SpO2: 97%  Weight: 127 kg  Height: 5\' 1"  (1.549 m)   Body mass index is 52.91 kg/m. Gen: WD/WN, NAD Head: [place]/AT, No temporalis wasting.  Ear/Nose/Throat: Hearing grossly intact, nares w/o erythema or drainage Eyes: PER, EOMI, sclera nonicteric.  Neck: Supple, no gross masses or lesions.  No JVD.  Pulmonary:  Good air movement, no audible wheezing, no use of accessory muscles.  Cardiac: RRR, precordium non-hyperdynamic. Vascular:   Right IJ catheter clean dry and intact; left brachiocephalic fistula with good thrill good bruit Vessel Right Left  Radial Palpable Palpable  Brachial Palpable Palpable  Gastrointestinal: soft, non-distended. No guarding/no peritoneal signs.  Musculoskeletal:  M/S 5/5 throughout.  No deformity.  Neurologic: CN 2-12 intact. Pain and light touch intact in extremities.  Symmetrical.  Speech is fluent. Motor exam as listed above. Psychiatric: Judgment intact, Mood & affect appropriate for pt's clinical situation. Dermatologic: No rashes or ulcers noted.  No changes consistent with cellulitis.   CBC Lab Results  Component Value Date   WBC 7.1 01/05/2021   HGB 9.8 (L) 01/05/2021   HCT 28.8 (L) 01/05/2021   MCV 88.3 01/05/2021   PLT 270 01/05/2021    BMET    Component Value Date/Time  NA 134 (L) 01/05/2021 0647   K 4.3 01/05/2021 0647   CL 96 (L) 01/05/2021 0647   CO2 29 01/05/2021 0647   GLUCOSE 165 (H) 01/05/2021 0647   BUN 24 (H) 01/05/2021 0647   CREATININE 5.78 (H) 01/05/2021 0647   CALCIUM 8.2 (L) 01/05/2021 0647   GFRNONAA 9 (L) 01/05/2021 0647   GFRAA 17 (L) 12/13/2019 0514   CrCl cannot be calculated (Patient's most recent lab result is older than the maximum 21 days allowed.).  COAG Lab Results  Component Value Date   INR 1.0 11/13/2020    Radiology No results found.   Assessment/Plan . End stage renal disease on dialysis [facility]) Recommend:  The patient is experiencing increasing problems with their dialysis access.  Patient should have a fistulagram with the intention for intervention.  The intention for intervention is to restore appropriate flow and prevent thrombosis and possible loss of the access.  As well as improve the quality of dialysis therapy.  The risks, benefits and alternative therapies were reviewed in detail with the patient.  All questions were answered.  The patient agrees to proceed with angio/intervention.      2. Type 2 diabetes mellitus with chronic kidney disease on chronic dialysis, unspecified whether long term insulin use ([place]) Continue hypoglycemic medications as already ordered, these medications have been reviewed and there are no changes at this time.   Hgb A1C to be monitored as  already arranged by primary service    3. Essential hypertension Continue antihypertensive medications as already ordered, these medications have been reviewed and there are no changes at this time.    [name], MD  06/22/2021 1:58 PM

## 2021-06-23 ENCOUNTER — Encounter: Payer: Self-pay | Admitting: Vascular Surgery

## 2021-06-30 ENCOUNTER — Other Ambulatory Visit: Payer: Self-pay

## 2021-06-30 MED ORDER — ESOMEPRAZOLE MAGNESIUM 20 MG PO CPDR
DELAYED_RELEASE_CAPSULE | ORAL | 11 refills | Status: DC
  Filled 2021-06-30: qty 30, 30d supply, fill #0

## 2021-07-01 ENCOUNTER — Other Ambulatory Visit: Payer: Self-pay

## 2021-07-05 ENCOUNTER — Other Ambulatory Visit: Payer: Self-pay

## 2021-07-05 MED ORDER — FLUCONAZOLE 150 MG PO TABS
ORAL_TABLET | Freq: Every day | ORAL | 1 refills | Status: DC
  Filled 2021-07-05: qty 1, 1d supply, fill #0
  Filled 2021-07-12: qty 1, 1d supply, fill #1

## 2021-07-06 ENCOUNTER — Other Ambulatory Visit: Payer: Self-pay

## 2021-07-08 ENCOUNTER — Ambulatory Visit (INDEPENDENT_AMBULATORY_CARE_PROVIDER_SITE_OTHER): Payer: Self-pay | Admitting: Vascular Surgery

## 2021-07-08 ENCOUNTER — Ambulatory Visit (INDEPENDENT_AMBULATORY_CARE_PROVIDER_SITE_OTHER): Payer: Medicaid Other

## 2021-07-08 ENCOUNTER — Encounter (INDEPENDENT_AMBULATORY_CARE_PROVIDER_SITE_OTHER): Payer: Self-pay | Admitting: Vascular Surgery

## 2021-07-08 ENCOUNTER — Other Ambulatory Visit: Payer: Self-pay

## 2021-07-08 VITALS — BP 150/93 | HR 85 | Ht 61.0 in | Wt 262.0 lb

## 2021-07-08 DIAGNOSIS — E1122 Type 2 diabetes mellitus with diabetic chronic kidney disease: Secondary | ICD-10-CM

## 2021-07-08 DIAGNOSIS — Z992 Dependence on renal dialysis: Secondary | ICD-10-CM | POA: Diagnosis not present

## 2021-07-08 DIAGNOSIS — N186 End stage renal disease: Secondary | ICD-10-CM

## 2021-07-08 DIAGNOSIS — E781 Pure hyperglyceridemia: Secondary | ICD-10-CM

## 2021-07-08 DIAGNOSIS — T829XXS Unspecified complication of cardiac and vascular prosthetic device, implant and graft, sequela: Secondary | ICD-10-CM

## 2021-07-08 DIAGNOSIS — I10 Essential (primary) hypertension: Secondary | ICD-10-CM

## 2021-07-08 NOTE — Progress Notes (Signed)
MRN : [id number]  [name] is a 46 y.o. (05-05-1976) female who presents with chief complaint of check access.  History of Present Illness:   The patient returns to the office for followup status post intervention of the dialysis access. Following the intervention the access function has significantly improved, with better flow rates and improved KT/V. The patient has not been experiencing increased bleeding times following decannulation and the patient denies increased recirculation. The patient denies an increase in arm swelling. At the present time the patient denies hand pain.  The patient denies amaurosis fugax or recent TIA symptoms. There are no recent neurological changes noted. The patient denies claudication symptoms or rest pain symptoms. The patient denies history of DVT, PE or superficial thrombophlebitis. The patient denies recent episodes of angina or shortness of breath.   Duplex ultrasound of the AV access shows a patent access with uniform velocities.  No focal hemodynamically significant stenosis noted at stent site.     No outpatient medications have been marked as taking for the 07/08/21 encounter (Appointment) with Delana Meyer, Dolores Lory, MD.    Past Medical History:  Diagnosis Date   Anemia    Asthma    well controlled   Complication of anesthesia    "takes alot for anesthesia to work"   Concussion    h/o   Depression    Diabetes ([place])    last A1c was 5.8 on 09-04-20   Dialysis patient ([place])    T, Th, and Sat   Family history of adverse reaction to anesthesia    son-became aggressive after surgery   GERD (gastroesophageal reflux disease)    occ no meds   Headache    migraines   Hypertension    Palpitations    Pneumonia    h/o   Renal disorder     Past Surgical History:  Procedure Laterality Date   A/V FISTULAGRAM Left 06/22/2021   Procedure: A/V FISTULAGRAM;  Surgeon: Katha Cabal, MD;  Location: [facility];  Service:  Cardiovascular;  Laterality: Left;   AV FISTULA PLACEMENT Left 11/20/2020   Procedure: ARTERIOVENOUS (AV) FISTULA CREATION ( BRACHIAL CEPHALIC );  Surgeon: Katha Cabal, MD;  Location: [facility];  Service: Vascular;  Laterality: Left;   CESAREAN SECTION N/A    x3   CHOLECYSTECTOMY     DIALYSIS/PERMA CATHETER INSERTION N/A 08/24/2020   Procedure: DIALYSIS/PERMA CATHETER INSERTION;  Surgeon: Algernon Huxley, MD;  Location: [facility];  Service: Cardiovascular;  Laterality: N/A;    Social History Social History   Tobacco Use   Smoking status: Never   Smokeless tobacco: Never  Vaping Use   Vaping Use: Never used  Substance Use Topics   Alcohol use: Never   Drug use: Never    Family History Family History  Problem Relation Age of Onset   Diabetes Mother    Skin cancer Mother    Heart attack Father        died at ~64 years of age   Other Sister        hit by a motorcycle   Alcohol abuse Brother    Diabetes Brother    HIV Brother     Allergies  Allergen Reactions   Statins Other (See Comments)    "redness and facial swelling"   Cholesterol Rash    Cholesterol medicine     REVIEW OF SYSTEMS (Negative unless checked)  Constitutional: [] Weight loss  [] Fever  [] Chills Cardiac: [] Chest pain   []   Chest pressure   [] Palpitations   [] Shortness of breath when laying flat   [] Shortness of breath with exertion. Vascular:  [] Pain in legs with walking   [] Pain in legs at rest  [] History of DVT   [] Phlebitis   [] Swelling in legs   [] Varicose veins   [] Non-healing ulcers Pulmonary:   [] Uses home oxygen   [] Productive cough   [] Hemoptysis   [] Wheeze  [] COPD   [] Asthma Neurologic:  [] Dizziness   [] Seizures   [] History of stroke   [] History of TIA  [] Aphasia   [] Vissual changes   [] Weakness or numbness in arm   [] Weakness or numbness in leg Musculoskeletal:   [] Joint swelling   [] Joint pain   [] Low back pain Hematologic:  [] Easy bruising  [] Easy bleeding   [] Hypercoagulable  state   [] Anemic Gastrointestinal:  [] Diarrhea   [] Vomiting  [x] Gastroesophageal reflux/heartburn   [] Difficulty swallowing. Genitourinary:  [x] Chronic kidney disease   [] Difficult urination  [] Frequent urination   [] Blood in urine Skin:  [] Rashes   [] Ulcers  Psychological:  [] History of anxiety   []  History of major depression.  Physical Examination  There were no vitals filed for this visit. There is no height or weight on file to calculate BMI. Gen: WD/WN, NAD Head: [place]/AT, No temporalis wasting.  Ear/Nose/Throat: Hearing grossly intact, nares w/o erythema or drainage Eyes: PER, EOMI, sclera nonicteric.  Neck: Supple, no gross masses or lesions.  No JVD.  Pulmonary:  Good air movement, no audible wheezing, no use of accessory muscles.  Cardiac: RRR, precordium non-hyperdynamic. Vascular:   left arm AV access good thrill good bruit Vessel Right Left  Radial Palpable Palpable  Brachial Palpable Palpable  Gastrointestinal: soft, non-distended. No guarding/no peritoneal signs.  Musculoskeletal: M/S 5/5 throughout.  No deformity.  Neurologic: CN 2-12 intact. Pain and light touch intact in extremities.  Symmetrical.  Speech is fluent. Motor exam as listed above. Psychiatric: Judgment intact, Mood & affect appropriate for pt's clinical situation. Dermatologic: No rashes or ulcers noted.  No changes consistent with cellulitis.   CBC Lab Results  Component Value Date   WBC 7.1 01/05/2021   HGB 9.8 (L) 01/05/2021   HCT 28.8 (L) 01/05/2021   MCV 88.3 01/05/2021   PLT 270 01/05/2021    BMET    Component Value Date/Time   NA 134 (L) 01/05/2021 0647   K 4.3 01/05/2021 0647   CL 96 (L) 01/05/2021 0647   CO2 29 01/05/2021 0647   GLUCOSE 165 (H) 01/05/2021 0647   BUN 24 (H) 01/05/2021 0647   CREATININE 5.78 (H) 01/05/2021 0647   CALCIUM 8.2 (L) 01/05/2021 0647   GFRNONAA 9 (L) 01/05/2021 0647   GFRAA 17 (L) 12/13/2019 0514   CrCl cannot be calculated (Patient's most recent lab  result is older than the maximum 21 days allowed.).  COAG Lab Results  Component Value Date   INR 1.0 11/13/2020    Radiology PERIPHERAL VASCULAR CATHETERIZATION  Result Date: 06/22/2021 See surgical note for result.    Assessment/Plan 1. End stage renal disease on dialysis [facility]) Recommend:  The patient is doing well and currently has adequate dialysis access. The patient's dialysis center is not reporting any access issues. Flow pattern is stable when compared to the prior ultrasound.  The patient should have a duplex ultrasound of the dialysis access in 6 months. The patient will follow-up with me in the office after each ultrasound    - VAS [place] (AVF, AVG); Future  2. Complication from  renal dialysis device, sequela Recommend:  The patient is doing well and currently has adequate dialysis access. The patient's dialysis center is not reporting any access issues. Flow pattern is stable when compared to the prior ultrasound.  The patient should have a duplex ultrasound of the dialysis access in 6 months. The patient will follow-up with me in the office after each ultrasound  - VAS [place] (AVF, AVG); Future  3. Essential hypertension Continue antihypertensive medications as already ordered, these medications have been reviewed and there are no changes at this time.   4. Type 2 diabetes mellitus with chronic kidney disease on chronic dialysis, without long-term current use of insulin ([facility]) Continue hypoglycemic medications as already ordered, these medications have been reviewed and there are no changes at this time.  Hgb A1C to be monitored as already arranged by primary service   5. Hypertriglyceridemia Continue statin as ordered and reviewed, no changes at this time     [name], MD  07/08/2021 10:46 AM

## 2021-07-08 NOTE — H&P (View-Only) (Signed)
MRN : [id number]  [name] is a 46 y.o. (1975/09/17) female who presents with chief complaint of check access.  History of Present Illness:   The patient returns to the office for followup status post intervention of the dialysis access. Following the intervention the access function has significantly improved, with better flow rates and improved KT/V. The patient has not been experiencing increased bleeding times following decannulation and the patient denies increased recirculation. The patient denies an increase in arm swelling. At the present time the patient denies hand pain.  The patient denies amaurosis fugax or recent TIA symptoms. There are no recent neurological changes noted. The patient denies claudication symptoms or rest pain symptoms. The patient denies history of DVT, PE or superficial thrombophlebitis. The patient denies recent episodes of angina or shortness of breath.   Duplex ultrasound of the AV access shows a patent access with uniform velocities.  No focal hemodynamically significant stenosis noted at stent site.     No outpatient medications have been marked as taking for the 07/08/21 encounter (Appointment) with Delana Meyer, Dolores Lory, MD.    Past Medical History:  Diagnosis Date   Anemia    Asthma    well controlled   Complication of anesthesia    "takes alot for anesthesia to work"   Concussion    h/o   Depression    Diabetes ([place])    last A1c was 5.8 on 09-04-20   Dialysis patient ([place])    T, Th, and Sat   Family history of adverse reaction to anesthesia    son-became aggressive after surgery   GERD (gastroesophageal reflux disease)    occ no meds   Headache    migraines   Hypertension    Palpitations    Pneumonia    h/o   Renal disorder     Past Surgical History:  Procedure Laterality Date   A/V FISTULAGRAM Left 06/22/2021   Procedure: A/V FISTULAGRAM;  Surgeon: Katha Cabal, MD;  Location: [place] [facility];  Service:  Cardiovascular;  Laterality: Left;   AV FISTULA PLACEMENT Left 11/20/2020   Procedure: ARTERIOVENOUS (AV) FISTULA CREATION ( BRACHIAL CEPHALIC );  Surgeon: Katha Cabal, MD;  Location: [facility];  Service: Vascular;  Laterality: Left;   CESAREAN SECTION N/A    x3   CHOLECYSTECTOMY     DIALYSIS/PERMA CATHETER INSERTION N/A 08/24/2020   Procedure: DIALYSIS/PERMA CATHETER INSERTION;  Surgeon: Algernon Huxley, MD;  Location: [place] [facility];  Service: Cardiovascular;  Laterality: N/A;    Social History Social History   Tobacco Use   Smoking status: Never   Smokeless tobacco: Never  Vaping Use   Vaping Use: Never used  Substance Use Topics   Alcohol use: Never   Drug use: Never    Family History Family History  Problem Relation Age of Onset   Diabetes Mother    Skin cancer Mother    Heart attack Father        died at ~48 years of age   Other Sister        hit by a motorcycle   Alcohol abuse Brother    Diabetes Brother    HIV Brother     Allergies  Allergen Reactions   Statins Other (See Comments)    "redness and facial swelling"   Cholesterol Rash    Cholesterol medicine     REVIEW OF SYSTEMS (Negative unless checked)  Constitutional: [] Weight loss  [] Fever  [] Chills Cardiac: [] Chest pain   []   Chest pressure   [] Palpitations   [] Shortness of breath when laying flat   [] Shortness of breath with exertion. Vascular:  [] Pain in legs with walking   [] Pain in legs at rest  [] History of DVT   [] Phlebitis   [] Swelling in legs   [] Varicose veins   [] Non-healing ulcers Pulmonary:   [] Uses home oxygen   [] Productive cough   [] Hemoptysis   [] Wheeze  [] COPD   [] Asthma Neurologic:  [] Dizziness   [] Seizures   [] History of stroke   [] History of TIA  [] Aphasia   [] Vissual changes   [] Weakness or numbness in arm   [] Weakness or numbness in leg Musculoskeletal:   [] Joint swelling   [] Joint pain   [] Low back pain Hematologic:  [] Easy bruising  [] Easy bleeding   [] Hypercoagulable  state   [] Anemic Gastrointestinal:  [] Diarrhea   [] Vomiting  [x] Gastroesophageal reflux/heartburn   [] Difficulty swallowing. Genitourinary:  [x] Chronic kidney disease   [] Difficult urination  [] Frequent urination   [] Blood in urine Skin:  [] Rashes   [] Ulcers  Psychological:  [] History of anxiety   []  History of major depression.  Physical Examination  There were no vitals filed for this visit. There is no height or weight on file to calculate BMI. Gen: WD/WN, NAD Head: [place]/AT, No temporalis wasting.  Ear/Nose/Throat: Hearing grossly intact, nares w/o erythema or drainage Eyes: PER, EOMI, sclera nonicteric.  Neck: Supple, no gross masses or lesions.  No JVD.  Pulmonary:  Good air movement, no audible wheezing, no use of accessory muscles.  Cardiac: RRR, precordium non-hyperdynamic. Vascular:   left arm AV access good thrill good bruit Vessel Right Left  Radial Palpable Palpable  Brachial Palpable Palpable  Gastrointestinal: soft, non-distended. No guarding/no peritoneal signs.  Musculoskeletal: M/S 5/5 throughout.  No deformity.  Neurologic: CN 2-12 intact. Pain and light touch intact in extremities.  Symmetrical.  Speech is fluent. Motor exam as listed above. Psychiatric: Judgment intact, Mood & affect appropriate for pt's clinical situation. Dermatologic: No rashes or ulcers noted.  No changes consistent with cellulitis.   CBC Lab Results  Component Value Date   WBC 7.1 01/05/2021   HGB 9.8 (L) 01/05/2021   HCT 28.8 (L) 01/05/2021   MCV 88.3 01/05/2021   PLT 270 01/05/2021    BMET    Component Value Date/Time   NA 134 (L) 01/05/2021 0647   K 4.3 01/05/2021 0647   CL 96 (L) 01/05/2021 0647   CO2 29 01/05/2021 0647   GLUCOSE 165 (H) 01/05/2021 0647   BUN 24 (H) 01/05/2021 0647   CREATININE 5.78 (H) 01/05/2021 0647   CALCIUM 8.2 (L) 01/05/2021 0647   GFRNONAA 9 (L) 01/05/2021 0647   GFRAA 17 (L) 12/13/2019 0514   CrCl cannot be calculated (Patient's most recent lab  result is older than the maximum 21 days allowed.).  COAG Lab Results  Component Value Date   INR 1.0 11/13/2020    Radiology PERIPHERAL VASCULAR CATHETERIZATION  Result Date: 06/22/2021 See surgical note for result.    Assessment/Plan 1. End stage renal disease on dialysis [facility]) Recommend:  The patient is doing well and currently has adequate dialysis access. The patient's dialysis center is not reporting any access issues. Flow pattern is stable when compared to the prior ultrasound.  The patient should have a duplex ultrasound of the dialysis access in 6 months. The patient will follow-up with me in the office after each ultrasound    - VAS [place] (AVF, AVG); Future  2. Complication from  renal dialysis device, sequela Recommend:  The patient is doing well and currently has adequate dialysis access. The patient's dialysis center is not reporting any access issues. Flow pattern is stable when compared to the prior ultrasound.  The patient should have a duplex ultrasound of the dialysis access in 6 months. The patient will follow-up with me in the office after each ultrasound  - VAS [place] (AVF, AVG); Future  3. Essential hypertension Continue antihypertensive medications as already ordered, these medications have been reviewed and there are no changes at this time.   4. Type 2 diabetes mellitus with chronic kidney disease on chronic dialysis, without long-term current use of insulin (HCC) Continue hypoglycemic medications as already ordered, these medications have been reviewed and there are no changes at this time.  Hgb A1C to be monitored as already arranged by primary service   5. Hypertriglyceridemia Continue statin as ordered and reviewed, no changes at this time     [name], MD  07/08/2021 10:46 AM

## 2021-07-11 ENCOUNTER — Encounter (INDEPENDENT_AMBULATORY_CARE_PROVIDER_SITE_OTHER): Payer: Self-pay | Admitting: Vascular Surgery

## 2021-07-11 DIAGNOSIS — T829XXA Unspecified complication of cardiac and vascular prosthetic device, implant and graft, initial encounter: Secondary | ICD-10-CM | POA: Insufficient documentation

## 2021-07-12 ENCOUNTER — Other Ambulatory Visit: Payer: Self-pay

## 2021-07-14 ENCOUNTER — Other Ambulatory Visit: Payer: Self-pay

## 2021-07-14 MED ORDER — SEVELAMER CARBONATE 800 MG PO TABS
1600.0000 mg | ORAL_TABLET | Freq: Three times a day (TID) | ORAL | 11 refills | Status: AC
Start: 2021-07-14 — End: ?
  Filled 2021-08-12: qty 180, 30d supply, fill #0
  Filled 2021-09-23: qty 90, 15d supply, fill #1

## 2021-07-21 ENCOUNTER — Telehealth (INDEPENDENT_AMBULATORY_CARE_PROVIDER_SITE_OTHER): Payer: Self-pay

## 2021-07-21 ENCOUNTER — Other Ambulatory Visit (INDEPENDENT_AMBULATORY_CARE_PROVIDER_SITE_OTHER): Payer: Self-pay | Admitting: Nurse Practitioner

## 2021-07-21 ENCOUNTER — Telehealth (INDEPENDENT_AMBULATORY_CARE_PROVIDER_SITE_OTHER): Payer: Self-pay | Admitting: Nurse Practitioner

## 2021-07-21 NOTE — Telephone Encounter (Signed)
Spoke with the patient and she has been scheduled with Dr. Lucky Cowboy for a permcath removal on 07/22/21 with a 2:15 pm arrival time to the MM. Pre-procedure instructions were discussed.  ?

## 2021-07-21 NOTE — Telephone Encounter (Signed)
I spoke with [name], the administrator for dialysis, due to concerns for permcath removal and orders.  I spoke with [name], our referral coordinator, and gave her orders to schedule PermCath Removal.  Orders will be faxed from dialysis center as well for our records.  ?

## 2021-07-22 ENCOUNTER — Encounter: Payer: Self-pay | Admitting: Vascular Surgery

## 2021-07-22 ENCOUNTER — Encounter
Admission: RE | Disposition: A | Discharge status: Home / Self Care | Payer: Self-pay | Source: Home / Self Care | Attending: Vascular Surgery

## 2021-07-22 ENCOUNTER — Encounter (INDEPENDENT_AMBULATORY_CARE_PROVIDER_SITE_OTHER): Payer: Self-pay

## 2021-07-22 ENCOUNTER — Ambulatory Visit (INDEPENDENT_AMBULATORY_CARE_PROVIDER_SITE_OTHER): Payer: Self-pay | Admitting: Vascular Surgery

## 2021-07-22 ENCOUNTER — Ambulatory Visit
Admission: RE | Admit: 2021-07-22 | Discharge: 2021-07-22 | Disposition: A | Discharge status: Home / Self Care | Payer: Self-pay | Attending: Vascular Surgery | Admitting: Vascular Surgery

## 2021-07-22 DIAGNOSIS — N186 End stage renal disease: Secondary | ICD-10-CM | POA: Insufficient documentation

## 2021-07-22 DIAGNOSIS — I120 Hypertensive chronic kidney disease with stage 5 chronic kidney disease or end stage renal disease: Secondary | ICD-10-CM | POA: Insufficient documentation

## 2021-07-22 DIAGNOSIS — E1122 Type 2 diabetes mellitus with diabetic chronic kidney disease: Secondary | ICD-10-CM | POA: Insufficient documentation

## 2021-07-22 DIAGNOSIS — E781 Pure hyperglyceridemia: Secondary | ICD-10-CM | POA: Insufficient documentation

## 2021-07-22 DIAGNOSIS — Z992 Dependence on renal dialysis: Secondary | ICD-10-CM

## 2021-07-22 DIAGNOSIS — Z4901 Encounter for fitting and adjustment of extracorporeal dialysis catheter: Secondary | ICD-10-CM | POA: Insufficient documentation

## 2021-07-22 HISTORY — PX: DIALYSIS/PERMA CATHETER REMOVAL: CATH118289

## 2021-07-22 SURGERY — DIALYSIS/PERMA CATHETER REMOVAL
Anesthesia: LOCAL

## 2021-07-22 SURGICAL SUPPLY — 2 items
FORCEPS HALSTEAD CVD 5IN STRL (INSTRUMENTS) ×1 IMPLANT
TRAY LACERAT/PLASTIC (MISCELLANEOUS) ×1 IMPLANT

## 2021-07-22 NOTE — Op Note (Signed)
Operative Note ? ? ? ? ?Preoperative diagnosis:   1. ESRD with functional permanent access ? ?Postoperative diagnosis:  1. ESRD with functional permanent access ? ?Procedure:  Removal of right jugular Permcath ? ?Surgeon:  [name], MD ? ?Anesthesia:  Local ? ?EBL:  Minimal ? ?Indication for the Procedure:  The patient has a functional permanent dialysis access and no longer needs their permcath.  This can be removed.  Risks and benefits are discussed and informed consent is obtained. ? ?Description of the Procedure:  The patient's right neck, chest and existing catheter were sterilely prepped and draped. The area around the catheter was anesthetized copiously with 1% lidocaine. The catheter was dissected out with curved hemostats until the cuff was freed from the surrounding fibrous sheath. The fiber sheath was transected, and the catheter was then removed in its entirety using gentle traction. Pressure was held and sterile dressings were placed. The patient tolerated the procedure well and was taken to the recovery room in stable condition. ? ? ? ? ?[name] ? ?07/22/2021, 2:08 PM ?This note was created with Dragon Medical transcription system. Any errors in dictation are purely unintentional.  ?

## 2021-07-22 NOTE — Interval H&P Note (Signed)
History and Physical Interval Note: ? ?07/22/2021 ?2:06 PM ? ?[name]  has presented today for surgery, with the diagnosis of Perma Cath Removal    End Stage Renal.  The various methods of treatment have been discussed with the patient and family. After consideration of risks, benefits and other options for treatment, the patient has consented to  Procedure(s): ?DIALYSIS/PERMA CATHETER REMOVAL (N/A) as a surgical intervention.  The patient's history has been reviewed, patient examined, no change in status, stable for surgery.  I have reviewed the patient's chart and labs.  Questions were answered to the patient's satisfaction.   ? ? ?[name] ? ? ?

## 2021-07-31 ENCOUNTER — Encounter: Payer: Self-pay | Admitting: Intensive Care

## 2021-07-31 ENCOUNTER — Inpatient Hospital Stay
Admission: EM | Admit: 2021-07-31 | Discharge: 2021-08-05 | DRG: 673 | Disposition: A | Discharge status: Home / Self Care | Payer: Medicaid Other | Attending: Internal Medicine | Admitting: Internal Medicine

## 2021-07-31 ENCOUNTER — Emergency Department: Payer: Medicaid Other

## 2021-07-31 ENCOUNTER — Other Ambulatory Visit: Payer: Self-pay

## 2021-07-31 DIAGNOSIS — Y832 Surgical operation with anastomosis, bypass or graft as the cause of abnormal reaction of the patient, or of later complication, without mention of misadventure at the time of the procedure: Secondary | ICD-10-CM | POA: Diagnosis present

## 2021-07-31 DIAGNOSIS — Z9049 Acquired absence of other specified parts of digestive tract: Secondary | ICD-10-CM

## 2021-07-31 DIAGNOSIS — Z888 Allergy status to other drugs, medicaments and biological substances status: Secondary | ICD-10-CM

## 2021-07-31 DIAGNOSIS — I10 Essential (primary) hypertension: Secondary | ICD-10-CM | POA: Diagnosis present

## 2021-07-31 DIAGNOSIS — E1169 Type 2 diabetes mellitus with other specified complication: Secondary | ICD-10-CM

## 2021-07-31 DIAGNOSIS — Z789 Other specified health status: Secondary | ICD-10-CM | POA: Diagnosis present

## 2021-07-31 DIAGNOSIS — E875 Hyperkalemia: Secondary | ICD-10-CM | POA: Diagnosis present

## 2021-07-31 DIAGNOSIS — I120 Hypertensive chronic kidney disease with stage 5 chronic kidney disease or end stage renal disease: Secondary | ICD-10-CM | POA: Diagnosis present

## 2021-07-31 DIAGNOSIS — T8241XA Breakdown (mechanical) of vascular dialysis catheter, initial encounter: Principal | ICD-10-CM | POA: Diagnosis present

## 2021-07-31 DIAGNOSIS — K219 Gastro-esophageal reflux disease without esophagitis: Secondary | ICD-10-CM | POA: Diagnosis present

## 2021-07-31 DIAGNOSIS — E1165 Type 2 diabetes mellitus with hyperglycemia: Secondary | ICD-10-CM | POA: Diagnosis present

## 2021-07-31 DIAGNOSIS — Z833 Family history of diabetes mellitus: Secondary | ICD-10-CM

## 2021-07-31 DIAGNOSIS — Z992 Dependence on renal dialysis: Secondary | ICD-10-CM

## 2021-07-31 DIAGNOSIS — Y712 Prosthetic and other implants, materials and accessory cardiovascular devices associated with adverse incidents: Secondary | ICD-10-CM | POA: Diagnosis present

## 2021-07-31 DIAGNOSIS — J45909 Unspecified asthma, uncomplicated: Secondary | ICD-10-CM | POA: Diagnosis present

## 2021-07-31 DIAGNOSIS — N186 End stage renal disease: Secondary | ICD-10-CM | POA: Diagnosis present

## 2021-07-31 DIAGNOSIS — T82898A Other specified complication of vascular prosthetic devices, implants and grafts, initial encounter: Principal | ICD-10-CM | POA: Diagnosis present

## 2021-07-31 DIAGNOSIS — I2510 Atherosclerotic heart disease of native coronary artery without angina pectoris: Secondary | ICD-10-CM | POA: Diagnosis present

## 2021-07-31 DIAGNOSIS — Z6841 Body Mass Index (BMI) 40.0 and over, adult: Secondary | ICD-10-CM

## 2021-07-31 DIAGNOSIS — N2581 Secondary hyperparathyroidism of renal origin: Secondary | ICD-10-CM | POA: Diagnosis present

## 2021-07-31 DIAGNOSIS — D631 Anemia in chronic kidney disease: Secondary | ICD-10-CM | POA: Diagnosis present

## 2021-07-31 DIAGNOSIS — Z79899 Other long term (current) drug therapy: Secondary | ICD-10-CM

## 2021-07-31 DIAGNOSIS — Z794 Long term (current) use of insulin: Secondary | ICD-10-CM

## 2021-07-31 DIAGNOSIS — F419 Anxiety disorder, unspecified: Secondary | ICD-10-CM | POA: Diagnosis present

## 2021-07-31 DIAGNOSIS — E1151 Type 2 diabetes mellitus with diabetic peripheral angiopathy without gangrene: Secondary | ICD-10-CM | POA: Diagnosis present

## 2021-07-31 DIAGNOSIS — E1122 Type 2 diabetes mellitus with diabetic chronic kidney disease: Secondary | ICD-10-CM | POA: Diagnosis present

## 2021-07-31 DIAGNOSIS — F32A Depression, unspecified: Secondary | ICD-10-CM | POA: Diagnosis present

## 2021-07-31 LAB — CBC
HCT: 28.8 % — ABNORMAL LOW (ref 36.0–46.0)
HCT: 28.5 % — ABNORMAL LOW (ref 36.0–46.0)
Hemoglobin: 9.4 g/dL — ABNORMAL LOW (ref 12.0–15.0)
Hemoglobin: 9.7 g/dL — ABNORMAL LOW (ref 12.0–15.0)
MCH: 30.9 pg (ref 26.0–34.0)
MCH: 31.3 pg (ref 26.0–34.0)
MCHC: 32.6 g/dL (ref 30.0–36.0)
MCHC: 34 g/dL (ref 30.0–36.0)
MCV: 94.7 fL (ref 80.0–100.0)
MCV: 91.9 fL (ref 80.0–100.0)
Platelets: 280 10*3/uL (ref 150–400)
Platelets: 281 10*3/uL (ref 150–400)
RBC: 3.04 MIL/uL — ABNORMAL LOW (ref 3.87–5.11)
RBC: 3.1 MIL/uL — ABNORMAL LOW (ref 3.87–5.11)
RDW: 13.2 % (ref 11.5–15.5)
RDW: 13.2 % (ref 11.5–15.5)
WBC: 11 10*3/uL — ABNORMAL HIGH (ref 4.0–10.5)
WBC: 9.8 10*3/uL (ref 4.0–10.5)
nRBC: 0 % (ref 0.0–0.2)
nRBC: 0 % (ref 0.0–0.2)

## 2021-07-31 LAB — BASIC METABOLIC PANEL
Anion gap: 15 (ref 5–15)
BUN: 62 mg/dL — ABNORMAL HIGH (ref 6–20)
CO2: 20 mmol/L — ABNORMAL LOW (ref 22–32)
Calcium: 7.4 mg/dL — ABNORMAL LOW (ref 8.9–10.3)
Chloride: 98 mmol/L (ref 98–111)
Creatinine, Ser: 11.55 mg/dL — ABNORMAL HIGH (ref 0.44–1.00)
GFR, Estimated: 4 mL/min — ABNORMAL LOW (ref >60)
Glucose, Bld: 287 mg/dL — ABNORMAL HIGH (ref 70–99)
Potassium: 5.3 mmol/L — ABNORMAL HIGH (ref 3.5–5.1)
Sodium: 133 mmol/L — ABNORMAL LOW (ref 135–145)

## 2021-07-31 LAB — RENAL FUNCTION PANEL
Albumin: 3.5 g/dL (ref 3.5–5.0)
Anion gap: 12 (ref 5–15)
BUN: 35 mg/dL — ABNORMAL HIGH (ref 6–20)
CO2: 25 mmol/L (ref 22–32)
Calcium: 7.6 mg/dL — ABNORMAL LOW (ref 8.9–10.3)
Chloride: 97 mmol/L — ABNORMAL LOW (ref 98–111)
Creatinine, Ser: 6.74 mg/dL — ABNORMAL HIGH (ref 0.44–1.00)
GFR, Estimated: 7 mL/min — ABNORMAL LOW (ref >60)
Glucose, Bld: 171 mg/dL — ABNORMAL HIGH (ref 70–99)
Phosphorus: 3.8 mg/dL (ref 2.5–4.6)
Potassium: 3.7 mmol/L (ref 3.5–5.1)
Sodium: 134 mmol/L — ABNORMAL LOW (ref 135–145)

## 2021-07-31 MED ORDER — ALTEPLASE 2 MG IJ SOLR
2.0000 mg | Freq: Once | INTRAMUSCULAR | Status: DC | PRN
  Filled 2021-07-31: qty 2

## 2021-07-31 MED ORDER — TRAZODONE HCL 50 MG PO TABS
25.0000 mg | ORAL_TABLET | Freq: Every evening | ORAL | Status: DC | PRN
  Administered 2021-08-02 – 2021-08-04 (×2): 25 mg via ORAL
  Filled 2021-07-31 (×2): qty 1

## 2021-07-31 MED ORDER — PANTOPRAZOLE SODIUM 40 MG PO TBEC
40.0000 mg | DELAYED_RELEASE_TABLET | Freq: Every day | ORAL | Status: DC
Start: 2021-08-01 — End: 2021-08-05
  Administered 2021-08-01 – 2021-08-05 (×5): 40 mg via ORAL
  Filled 2021-07-31 (×5): qty 1

## 2021-07-31 MED ORDER — HEPARIN SODIUM (PORCINE) 1000 UNIT/ML DIALYSIS
100.0000 [IU]/kg | INTRAMUSCULAR | Status: DC | PRN
  Filled 2021-07-31: qty 13

## 2021-07-31 MED ORDER — SODIUM CHLORIDE 0.9 % IV SOLN: 100.0000 mL | INTRAVENOUS | Status: DC | PRN

## 2021-07-31 MED ORDER — METOPROLOL SUCCINATE ER 50 MG PO TB24
50.0000 mg | ORAL_TABLET | Freq: Every day | ORAL | Status: DC
  Administered 2021-08-01 – 2021-08-05 (×4): 50 mg via ORAL
  Filled 2021-07-31 (×4): qty 1

## 2021-07-31 MED ORDER — HEPARIN SODIUM (PORCINE) 1000 UNIT/ML DIALYSIS
1000.0000 [IU] | INTRAMUSCULAR | Status: DC | PRN
  Administered 2021-07-31: 1000 [IU] via INTRAVENOUS_CENTRAL
  Filled 2021-07-31 (×2): qty 1

## 2021-07-31 MED ORDER — MAGNESIUM HYDROXIDE 400 MG/5ML PO SUSP
30.0000 mL | Freq: Every day | ORAL | Status: DC | PRN
  Administered 2021-08-01: 30 mL via ORAL
  Filled 2021-07-31: qty 30

## 2021-07-31 MED ORDER — ACETAMINOPHEN 325 MG PO TABS
ORAL_TABLET | ORAL | Status: AC
  Filled 2021-07-31: qty 2

## 2021-07-31 MED ORDER — LIDOCAINE HCL (PF) 1 % IJ SOLN: 5.0000 mL | INTRAMUSCULAR | Status: DC | PRN

## 2021-07-31 MED ORDER — PENTAFLUOROPROP-TETRAFLUOROETH EX AERO
1.0000 "application " | INHALATION_SPRAY | CUTANEOUS | Status: DC | PRN
  Filled 2021-07-31: qty 30

## 2021-07-31 MED ORDER — CHLORHEXIDINE GLUCONATE CLOTH 2 % EX PADS
6.0000 | MEDICATED_PAD | Freq: Every day | CUTANEOUS | Status: DC
  Filled 2021-07-31: qty 6

## 2021-07-31 MED ORDER — ACETAMINOPHEN 650 MG RE SUPP: 650.0000 mg | Freq: Four times a day (QID) | RECTAL | Status: DC | PRN

## 2021-07-31 MED ORDER — DIPHENHYDRAMINE HCL 25 MG PO TABS
25.0000 mg | ORAL_TABLET | Freq: Every day | ORAL | Status: DC | PRN
  Administered 2021-08-02: 25 mg via ORAL
  Filled 2021-07-31 (×5): qty 1

## 2021-07-31 MED ORDER — SEVELAMER CARBONATE 800 MG PO TABS
1600.0000 mg | ORAL_TABLET | Freq: Three times a day (TID) | ORAL | Status: DC
  Administered 2021-08-01 – 2021-08-05 (×12): 1600 mg via ORAL
  Filled 2021-07-31 (×14): qty 2

## 2021-07-31 MED ORDER — AMLODIPINE BESYLATE 10 MG PO TABS
10.0000 mg | ORAL_TABLET | Freq: Every day | ORAL | Status: DC
Start: 2021-08-01 — End: 2021-08-05
  Administered 2021-08-01 – 2021-08-05 (×3): 10 mg via ORAL
  Filled 2021-07-31: qty 2
  Filled 2021-07-31 (×2): qty 1

## 2021-07-31 MED ORDER — LIDOCAINE-PRILOCAINE 2.5-2.5 % EX CREA: 1.0000 "application " | TOPICAL_CREAM | CUTANEOUS | Status: DC | PRN

## 2021-07-31 MED ORDER — HEPARIN SODIUM (PORCINE) 5000 UNIT/ML IJ SOLN
5000.0000 [IU] | Freq: Three times a day (TID) | INTRAMUSCULAR | Status: DC
  Administered 2021-07-31 – 2021-08-05 (×14): 5000 [IU] via SUBCUTANEOUS
  Filled 2021-07-31 (×14): qty 1

## 2021-07-31 MED ORDER — HEPARIN SODIUM (PORCINE) 1000 UNIT/ML IJ SOLN
INTRAMUSCULAR | Status: AC
  Filled 2021-07-31: qty 10

## 2021-07-31 MED ORDER — ONDANSETRON HCL 4 MG PO TABS: 4.0000 mg | ORAL_TABLET | Freq: Four times a day (QID) | ORAL | Status: DC | PRN

## 2021-07-31 MED ORDER — ACETAMINOPHEN 325 MG PO TABS
650.0000 mg | ORAL_TABLET | Freq: Four times a day (QID) | ORAL | Status: DC | PRN
  Administered 2021-07-31 – 2021-08-04 (×8): 650 mg via ORAL
  Filled 2021-07-31 (×6): qty 2

## 2021-07-31 MED ORDER — FUROSEMIDE 40 MG PO TABS: 80.0000 mg | ORAL_TABLET | Freq: Two times a day (BID) | ORAL | Status: DC

## 2021-07-31 MED ORDER — CALCITRIOL 1 MCG/ML IV SOLN
2.0000 ug | INTRAVENOUS | Status: DC
  Administered 2021-08-02 – 2021-08-04 (×2): 2 ug via INTRAVENOUS
  Filled 2021-07-31 (×2): qty 2

## 2021-07-31 MED ORDER — INSULIN GLARGINE-YFGN 100 UNIT/ML ~~LOC~~ SOLN
12.0000 [IU] | Freq: Every day | SUBCUTANEOUS | Status: DC
  Administered 2021-07-31 – 2021-08-04 (×5): 12 [IU] via SUBCUTANEOUS
  Filled 2021-07-31 (×6): qty 0.12

## 2021-07-31 MED ORDER — SEVELAMER CARBONATE 800 MG PO TABS: 1600.0000 mg | ORAL_TABLET | Freq: Three times a day (TID) | ORAL | Status: DC

## 2021-07-31 MED ORDER — ONDANSETRON HCL 4 MG/2ML IJ SOLN: 4.0000 mg | Freq: Four times a day (QID) | INTRAMUSCULAR | Status: DC | PRN

## 2021-07-31 NOTE — Consult Note (Signed)
Referring Provider: No ref. provider found ?Primary Care Physician:  System, Provider Not In ?Primary Nephrologist:  Dr. Candiss Norse ? ?Reason for Consultation: ESRD ? ?HPI: 46 year old obese female with a history of hypertension, coronary artery disease, diabetes, peripheral vascular disease, end-stage renal disease and anemia now sent to the hospital because of inability to access her dialysis AV fistula on the left arm.  She has an AV fistula functioning and had the permacath removed recently.  Patient denies any other symptoms.  She is on a Monday Wednesday Friday schedule. ? ?Past Medical History:  ?Diagnosis Date  ? Anemia   ? Asthma   ? well controlled  ? Complication of anesthesia   ? "takes alot for anesthesia to work"  ? Concussion   ? h/o  ? Depression   ? Diabetes ([place])   ? last A1c was 5.8 on 09-04-20  ? Dialysis patient [facility])   ? T, Th, and Sat  ? Family history of adverse reaction to anesthesia   ? son-became aggressive after surgery  ? GERD (gastroesophageal reflux disease)   ? occ no meds  ? Headache   ? migraines  ? Hypertension   ? Palpitations   ? Pneumonia   ? h/o  ? Renal disorder   ? ? ?Past Surgical History:  ?Procedure Laterality Date  ? A/V FISTULAGRAM Left 06/22/2021  ? Procedure: A/V FISTULAGRAM;  Surgeon: Katha Cabal, MD;  Location: [place] [facility];  Service: Cardiovascular;  Laterality: Left;  ? AV FISTULA PLACEMENT Left 11/20/2020  ? Procedure: ARTERIOVENOUS (AV) FISTULA CREATION ( BRACHIAL CEPHALIC );  Surgeon: Katha Cabal, MD;  Location: [facility];  Service: Vascular;  Laterality: Left;  ? CESAREAN SECTION N/A   ? x3  ? CHOLECYSTECTOMY    ? DIALYSIS/PERMA CATHETER INSERTION N/A 08/24/2020  ? Procedure: DIALYSIS/PERMA CATHETER INSERTION;  Surgeon: Algernon Huxley, MD;  Location: [place] [facility];  Service: Cardiovascular;  Laterality: N/A;  ? DIALYSIS/PERMA CATHETER REMOVAL N/A 07/22/2021  ? Procedure: DIALYSIS/PERMA CATHETER REMOVAL;  Surgeon: Algernon Huxley, MD;  Location: [place] [facility];  Service: Cardiovascular;  Laterality: N/A;  ? ? ?Prior to Admission medications   ?Medication Sig Start Date End Date Taking? Authorizing Provider  ?acetaminophen (TYLENOL) 500 MG tablet Take 500-1,000 mg by mouth every 6 (six) hours as needed (pain).    [provider]  ?amLODipine (NORVASC) 10 MG tablet Take 1 tablet (10 mg total) by mouth once daily. 06/16/21 09/15/21  Iloabachie, Chioma E, NP  ?Biotin w/ Vitamins C & E (HAIR SKIN & NAILS GUMMIES PO) Take 2 tablets by mouth daily.    [provider]  ?Blood Glucose Monitoring Suppl (RIGHTEST O6331619 BLOOD GLUCOSE) w/Device KIT Use up to four times daily as directed to check blood sugar. 06/16/21   Iloabachie, Chioma E, NP  ?Blood Pressure KIT USE AS DIRECTED. 11/17/20   Iloabachie, Chioma E, NP  ?CALCITRIOL IV Inject 2 mcg into the vein 3 (three) times a week.    [provider]  ?diphenhydrAMINE (BENADRYL) 25 MG tablet Take 25 mg by mouth daily as needed (before dialysis). Taking as needed    [provider]  ?epoetin alfa (EPOGEN) 10000 UNIT/ML injection Inject 1,400 Units into the vein 3 (three) times a week.    [provider]  ?fluconazole (DIFLUCAN) 150 MG tablet TAKE 1 TABLET BY MOUTH ONCE DAILY. 07/05/21     ?furosemide (LASIX) 80 MG tablet Take 1 tablet by mouth twice daily. 06/18/21     ?  glucose blood (RIGHTEST GS550 BLOOD GLUCOSE) test strip USE AS DIRECTED UP TO FOUR TIMES DAILY. 12/24/20   Iloabachie, Chioma E, NP  ?Insulin Glargine (BASAGLAR KWIKPEN) 100 UNIT/ML Inject 14 Units into the skin once daily at bedtime. ?Patient taking differently: Inject 12 Units into the skin at bedtime. 12/24/20   Iloabachie, Chioma E, NP  ?Insulin Pen Needle 32G X 4 MM MISC USE AS DIRECTED ONCE DAILY AT BEDTIME. 12/24/20   Iloabachie, Chioma E, NP  ?Iron Sucrose (VENOFER IV) Inject 100 mg into the vein 3 (three) times a week.    [provider]  ?lidocaine-prilocaine (EMLA) cream Apply 1 application topically to  the affected area(s) daily as needed. 02/04/21   Iloabachie, Chioma E, NP  ?metoprolol succinate (TOPROL-XL) 50 MG 24 hr tablet Take 1 tablet (50 mg total) by mouth once daily. Take with or immediately following a meal. 06/16/21   Iloabachie, Chioma E, NP  ?omeprazole (PRILOSEC) 20 MG capsule Take 1 capsule (20 mg total) by mouth once daily. 05/26/21     ?Rightest GL300 Lancets MISC USE AS DIRECTED UP TO FOUR TIMES DAILY. 12/24/20   Iloabachie, Chioma E, NP  ?sevelamer carbonate (RENVELA) 800 MG tablet TAKE ONE TABLET BY MOUTH 3 TIMES DAILY WITH MEALS. ?Patient taking differently: Take 1,600 mg by mouth with breakfast, with lunch, and with evening meal. 03/29/21     ?sevelamer carbonate (RENVELA) 800 MG tablet Take 2 tablets (1,600 mg total) by mouth 3 (three) times daily with a meal. 07/14/21     ?esomeprazole (NEXIUM) 20 MG capsule Take 1.0 Capsule Delayed Release Oral once a day 06/30/21 06/30/21    ? ? ?Current Facility-Administered Medications  ?Medication Dose Route Frequency Provider Last Rate Last Admin  ? 0.9 %  sodium chloride infusion  100 mL Intravenous PRN Lanora Manis, Mourad Cwikla, MD      ? 0.9 %  sodium chloride infusion  100 mL Intravenous PRN Lyla Son, MD      ? acetaminophen (TYLENOL) tablet 650 mg  650 mg Oral Q6H PRN Mansy, Jan A, MD      ? Or  ? acetaminophen (TYLENOL) suppository 650 mg  650 mg Rectal Q6H PRN Mansy, Jan A, MD      ? alteplase (CATHFLO ACTIVASE) injection 2 mg  2 mg Intracatheter Once PRN Lyla Son, MD      ? amLODipine (NORVASC) tablet 10 mg  10 mg Oral Daily Mansy, Arvella Merles, MD      ? Kara Mead KwikPen 12 Units  12 Units Subcutaneous QHS Mansy, Jan A, MD      ? Derrill Memo ON 08/02/2021] calcitRIOL (CALCIJEX) 1 MCG/ML injection 2 mcg  2 mcg Intravenous Once per day on Mon Wed Fri Mansy, Jan A, MD      ? Derrill Memo ON 08/01/2021] Chlorhexidine Gluconate Cloth 2 % PADS 6 each  6 each Topical U4403 Lyla Son, MD      ? diphenhydrAMINE (BENADRYL) tablet 25 mg  25 mg Oral Daily PRN  Mansy, Jan A, MD      ? furosemide (LASIX) tablet 80 mg  80 mg Oral BID Mansy, Jan A, MD      ? heparin injection 1,000 Units  1,000 Units Dialysis PRN Lyla Son, MD      ? heparin injection 12,300 Units  100 Units/kg Dialysis PRN Lyla Son, MD      ? heparin injection 5,000 Units  5,000 Units Subcutaneous Q8H Mansy, Jan A, MD      ? lidocaine (PF) (  XYLOCAINE) 1 % injection 5 mL  5 mL Intradermal PRN Lyla Son, MD      ? lidocaine-prilocaine (EMLA) cream 1 application.  1 application. Topical PRN Lyla Son, MD      ? magnesium hydroxide (MILK OF MAGNESIA) suspension 30 mL  30 mL Oral Daily PRN Mansy, Jan A, MD      ? metoprolol succinate (TOPROL-XL) 24 hr tablet 50 mg  50 mg Oral Daily Mansy, Jan A, MD      ? ondansetron [facility]) tablet 4 mg  4 mg Oral Q6H PRN Mansy, Jan A, MD      ? Or  ? ondansetron [facility]) injection 4 mg  4 mg Intravenous Q6H PRN Mansy, Jan A, MD      ? pantoprazole (PROTONIX) EC tablet 40 mg  40 mg Oral Daily Mansy, Arvella Merles, MD      ? pentafluoroprop-tetrafluoroeth [place] [place]) aerosol 1 application.  1 application. Topical PRN Lyla Son, MD      ? sevelamer carbonate (RENVELA) tablet 1,600 mg  1,600 mg Oral TID with meals Mansy, Jan A, MD      ? sevelamer carbonate (RENVELA) tablet 1,600 mg  1,600 mg Oral TID Mansy, Jan A, MD      ? traZODone (DESYREL) tablet 25 mg  25 mg Oral QHS PRN Mansy, Arvella Merles, MD      ? ?Current Outpatient Medications  ?Medication Sig Dispense Refill  ? acetaminophen (TYLENOL) 500 MG tablet Take 500-1,000 mg by mouth every 6 (six) hours as needed (pain).    ? amLODipine (NORVASC) 10 MG tablet Take 1 tablet (10 mg total) by mouth once daily. 90 tablet 1  ? Biotin w/ Vitamins C & E (HAIR SKIN & NAILS GUMMIES PO) Take 2 tablets by mouth daily.    ? Blood Glucose Monitoring Suppl (RIGHTEST O6331619 BLOOD GLUCOSE) w/Device KIT Use up to four times daily as directed to check blood sugar. 1 kit 0  ? Blood Pressure KIT USE AS DIRECTED. 1 kit 0  ?  CALCITRIOL IV Inject 2 mcg into the vein 3 (three) times a week.    ? diphenhydrAMINE (BENADRYL) 25 MG tablet Take 25 mg by mouth daily as needed (before dialysis). Taking as needed    ? epoetin alfa (EPOGE

## 2021-07-31 NOTE — Progress Notes (Signed)
Evaluated patient in Dialysis. ? ?Assisted HD Nurse access fistula. ?Some areas deep to skin making access challenging. ?Able to gain access distal. Patient tolerating HD. ?HD Nurse to mark optimal areas for future HD. ?Have follow up with Dr. Delana Meyer next week. ? ?

## 2021-07-31 NOTE — ED Provider Notes (Signed)
? ?[facility] ?Provider Note ? ? ? Event Date/Time  ? First MD Initiated Contact with Patient 07/31/21 1320   ?  (approximate) ? ? ?History  ? ?Vascular Access Problem ? ? ?HPI ? ?[name] is a 46 y.o. female with a history of end-stage renal disease who presents with difficulty with vascular access.  Patient reports technician may not have injected heparin on Monday of this week however she did obtain dialysis on Monday and Wednesday.  On Friday and today patient was unable to have dialysis because of AV fistula issue.  Overall she feels well and has no significant plaints.  No shortness of breath.  Review of medical records demonstrates that patient has been seen by vascular surgery, recently had revision of fistula 1 month ago ?  ? ? ?Physical Exam  ? ?Triage Vital Signs: ?ED Triage Vitals [07/31/21 1316]  ?Enc Vitals Group  ?   BP (!) 149/76  ?   Pulse Rate 70  ?   Resp 18  ?   Temp 98.5 ?F (36.9 ?C)  ?   Temp Source Oral  ?   SpO2 98 %  ?   Weight 122.5 kg (270 lb)  ?   Height 1.549 m (5\' 1" )  ?   Head Circumference   ?   Peak Flow   ?   Pain Score 0  ?   Pain Loc   ?   Pain Edu?   ?   Excl. in [place]?   ? ? ?Most recent vital signs: ?Vitals:  ? 07/31/21 1430 07/31/21 1435  ?BP: (!) 159/85   ?Pulse: 71 71  ?Resp:    ?Temp:    ?SpO2: 100% 100%  ? ? ? ?General: Awake, no distress.  ?CV:  Good peripheral perfusion.  ?Resp:  Normal effort.  CTA bilaterally ?Abd:  No distention.  ?Other:  Left upper extremity, positive thrill, warm and well-perfused distally ? ?ED Results / Procedures / Treatments  ? ?Labs ?(all labs ordered are listed, but only abnormal results are displayed) ?Labs Reviewed  ?CBC - Abnormal; Notable for the following components:  ?    Result Value  ? WBC 11.0 (*)   ? RBC 3.04 (*)   ? Hemoglobin 9.4 (*)   ? HCT 28.8 (*)   ? All other components within normal limits  ?BASIC METABOLIC PANEL - Abnormal; Notable for the following components:  ? Sodium 133 (*)   ? Potassium 5.3  (*)   ? CO2 20 (*)   ? Glucose, Bld 287 (*)   ? BUN 62 (*)   ? Creatinine, Ser 11.55 (*)   ? Calcium 7.4 (*)   ? GFR, Estimated 4 (*)   ? All other components within normal limits  ? ? ? ?EKG ? ? ? ? ?RADIOLOGY ? ? ? ? ?PROCEDURES: ? ?Critical Care performed:  ? ?Procedures ? ? ?MEDICATIONS ORDERED IN ED: ?Medications  ?amLODipine (NORVASC) tablet 10 mg (has no administration in time range)  ?furosemide (LASIX) tablet 80 mg (has no administration in time range)  ?metoprolol succinate (TOPROL-XL) 24 hr tablet 50 mg (has no administration in time range)  ?calcitRIOL (CALCIJEX) 1 MCG/ML injection 2 mcg (has no administration in time range)  ?Basaglar KwikPen KwikPen 12 Units (has no administration in time range)  ?pantoprazole (PROTONIX) EC tablet 40 mg (has no administration in time range)  ?sevelamer carbonate (RENVELA) tablet 1,600 mg (has no administration in time range)  ?sevelamer carbonate (RENVELA) tablet  1,600 mg (has no administration in time range)  ?diphenhydrAMINE (BENADRYL) tablet 25 mg (has no administration in time range)  ?heparin injection 5,000 Units (has no administration in time range)  ?acetaminophen (TYLENOL) tablet 650 mg (has no administration in time range)  ?  Or  ?acetaminophen (TYLENOL) suppository 650 mg (has no administration in time range)  ?traZODone (DESYREL) tablet 25 mg (has no administration in time range)  ?ondansetron (ZOFRAN) tablet 4 mg (has no administration in time range)  ?  Or  ?ondansetron (ZOFRAN) injection 4 mg (has no administration in time range)  ?magnesium hydroxide (MILK OF MAGNESIA) suspension 30 mL (has no administration in time range)  ? ? ? ?IMPRESSION / MDM / ASSESSMENT AND PLAN / ED COURSE  ?I reviewed the triage vital signs and the nursing notes. ? ?Patient well-appearing and in no acute distress, no shortness of breath, no rales ? ?We will obtain CBC BMP, evaluate potassium, discussed with vascular surgery. ? ?Patient's potassium is mildly elevated at 5.3,  discussed with Dr. Joylene John of nephrology who recommends admission, consult vascular surgery for temporary catheter and dialysis. ? ?I did speak with Dr. Lorenso Courier of vascular surgery ? ?Discussed with the hospitalist for admission ? ?Nephrology in with patient, has contacted dialysis nurse to come and attempt access ? ? ? ?  ? ? ?FINAL CLINICAL IMPRESSION(S) / ED DIAGNOSES  ? ?Final diagnoses:  ?Arteriovenous fistula occlusion, initial encounter ([place])  ?Hyperkalemia  ? ? ? ?Rx / DC Orders  ? ?ED Discharge Orders   ? ? None  ? ?  ? ? ? ?Note:  This document was prepared using Dragon voice recognition software and may include unintentional dictation errors. ?  ?Lavonia Drafts, MD ?07/31/21 1439 ? ?

## 2021-07-31 NOTE — ED Triage Notes (Signed)
Dialysis patient  M,W, F. Presents with clogged access in left arm. Last had dialysis on Wednesday.  ?

## 2021-07-31 NOTE — Assessment & Plan Note (Signed)
-   The patient is getting hemodialysis on Mondays, Wednesdays and Fridays. ?- She missed today due to vascular access problem. ?- Nephrology consult and vascular surgery consult were obtained as mentioned above. ?- We will continue her Renvela. ?

## 2021-07-31 NOTE — ED Notes (Signed)
Pt sent here from dialysis for dialysis access not working yesterday or today. ?

## 2021-07-31 NOTE — ED Notes (Signed)
[place], light green, blue, red top tubes sent to lab. ?

## 2021-07-31 NOTE — Assessment & Plan Note (Signed)
-   We will continue Toprol-XL and Norvasc. ?

## 2021-07-31 NOTE — Progress Notes (Signed)
Patient completed dialysis treatment as ordered. Dr. Lorenso Courier , vascular surgeon assist with cannulation of left arm fistula. Patient given Tylenol for pain to left arm, pain was resolved. Site marked as instructed for easy identification of cannulation site. Picture taken with patient phone as instructed by care coordinator at patient dialysis center. 3000 ml removed over 3 hours. Report given to Merrilyn Puma, RN. ?

## 2021-07-31 NOTE — H&P (View-Only) (Signed)
Evaluated patient in Dialysis. ? ?Assisted HD Nurse access fistula. ?Some areas deep to skin making access challenging. ?Able to gain access distal. Patient tolerating HD. ?HD Nurse to mark optimal areas for future HD. ?Have follow up with Dr. Delana Meyer next week. ? ?

## 2021-07-31 NOTE — ED Notes (Signed)
Medications not yet verified by pharmacy. ?

## 2021-07-31 NOTE — Assessment & Plan Note (Signed)
-   We will place the patient on supplement coverage with NovoLog. ?- We will continue her basal coverage. ?

## 2021-07-31 NOTE — Assessment & Plan Note (Signed)
-   The patient will be admitted to observation medical telemetry bed. ?- Nephrology consult was obtained as mentioned above. ?- Dr. Mel Almond potty has evaluated the patient and recommended vascular surgery consult to to place a vascular access for hemodialysis today. ?- Vascular surgery consult will be obtained. ?- Dr. Lorenso Courier was notified about the patient. ?

## 2021-07-31 NOTE — Assessment & Plan Note (Signed)
-   We will continue PPI therapy 

## 2021-07-31 NOTE — H&P (Signed)
?  ?  ?[place] ? ? ?PATIENT NAME: [name]   ? ?MR#:  [id number] ? ?DATE OF BIRTH:  31-Oct-1975 ? ?DATE OF ADMISSION:  07/31/2021 ? ?PRIMARY CARE PHYSICIAN: System, Provider Not In  ? ?Patient is coming from: Home ? ?REQUESTING/REFERRING PHYSICIAN: Lavonia Drafts, MD ? ?CHIEF COMPLAINT:  ? ?Chief Complaint  ?Patient presents with  ? Vascular Access Problem  ? ? ?HISTORY OF PRESENT ILLNESS:  ?[name] is a 46 y.o. Hispanic female with medical history significant for GERD, type 2 diabetes mellitus, depression, hypertension and asthma, as well as stage renal disease on hemodialysis who presented to the emergency room with acute onset of problematic vascular access with her AV fistula.  She has received hemodialysis on Monday and Wednesday but today despite going to another HD center, it was not accessible.  She had a revision of her fistula about a month ago by vascular surgery.  She denies any nausea or vomiting or abdominal pain. She experienced chills on Thursday and took Tylenol but denied any fever or chills the last couple of days.  She is still making small amount of urine and denies any dysuria, urinary frequency or urgency or flank pain.  No chest pain or dyspnea or cough or wheezing. ? ?ED Course: Upon presentation to the ER, BP was 159/85 with otherwise normal vital signs.  Labs revealed a potassium of 5.3 with a sodium 133 and chloride of 98 CO2 of 20 and blood glucose of 287 with a BUN of 16 creatinine 11.55 with a calcium of 7.4.  CBC showed WBC of 11 hemoglobin of 9.4 hematocrit 28.8. ? ?Imaging: Chest x-ray showed cardiomegaly with mild interstitial pulmonary edema likely slightly improved from 01/03/2021.  There was interval removal of right internal jugular central venous dialysis catheter. ? ?Dr. Mel Almond potty was notified about the patient and recommended having vascular surgery consult.  Dr. Lorenso Courier for the patient vascular access for hemodialysis today.  She will be admitted to an  observation medical telemetry bed for further evaluation and management. ?PAST MEDICAL HISTORY:  ? ?Past Medical History:  ?Diagnosis Date  ? Anemia   ? Asthma   ? well controlled  ? Complication of anesthesia   ? "takes alot for anesthesia to work"  ? Concussion   ? h/o  ? Depression   ? Diabetes ([place])   ? last A1c was 5.8 on 09-04-20  ? Dialysis patient [facility])   ? T, Th, and Sat  ? Family history of adverse reaction to anesthesia   ? son-became aggressive after surgery  ? GERD (gastroesophageal reflux disease)   ? occ no meds  ? Headache   ? migraines  ? Hypertension   ? Palpitations   ? Pneumonia   ? h/o  ? Renal disorder   ? ? ?PAST SURGICAL HISTORY:  ? ?Past Surgical History:  ?Procedure Laterality Date  ? A/V FISTULAGRAM Left 06/22/2021  ? Procedure: A/V FISTULAGRAM;  Surgeon: Katha Cabal, MD;  Location: [place] [facility];  Service: Cardiovascular;  Laterality: Left;  ? AV FISTULA PLACEMENT Left 11/20/2020  ? Procedure: ARTERIOVENOUS (AV) FISTULA CREATION ( BRACHIAL CEPHALIC );  Surgeon: Katha Cabal, MD;  Location: [facility] OR[facility];  Service: Vascular;  Laterality: Left;  ? CESAREAN SECTION N/A   ? x3  ? CHOLECYSTECTOMY    ? DIALYSIS/PERMA CATHETER INSERTION N/A 08/24/2020  ? Procedure: DIALYSIS/PERMA CATHETER INSERTION;  Surgeon: Algernon Huxley, MD;  Location: [place] [facility];  Service: Cardiovascular;  Laterality: N/A;  ? DIALYSIS/PERMA CATHETER REMOVAL N/A 07/22/2021  ? Procedure: DIALYSIS/PERMA CATHETER REMOVAL;  Surgeon: Algernon Huxley, MD;  Location: [place] [facility];  Service: Cardiovascular;  Laterality: N/A;  ? ? ?SOCIAL HISTORY:  ? ?Social History  ? ?Tobacco Use  ? Smoking status: Never  ? Smokeless tobacco: Never  ?Substance Use Topics  ? Alcohol use: Never  ? ? ?FAMILY HISTORY:  ? ?Family History  ?Problem Relation Age of Onset  ? Diabetes Mother   ? Skin cancer Mother   ? Heart attack Father   ?     died at ~59 years of age  ? Other Sister   ?     hit by a motorcycle  ? Alcohol abuse  Brother   ? Diabetes Brother   ? HIV Brother   ? ? ?DRUG ALLERGIES:  ? ?Allergies  ?Allergen Reactions  ? Statins Other (See Comments)  ?  "redness and facial swelling"  ? Cholesterol Rash  ?  Cholesterol medicine  ? ? ?REVIEW OF SYSTEMS:  ? ?ROS ?As per history of present illness. All pertinent systems were reviewed above. Constitutional, HEENT, cardiovascular, respiratory, GI, GU, musculoskeletal, neuro, psychiatric, endocrine, integumentary and hematologic systems were reviewed and are otherwise negative/unremarkable except for positive findings mentioned above in the HPI. ? ? ?MEDICATIONS AT HOME:  ? ?Prior to Admission medications   ?Medication Sig Start Date End Date Taking? Authorizing Provider  ?acetaminophen (TYLENOL) 500 MG tablet Take 500-1,000 mg by mouth every 6 (six) hours as needed (pain).    [provider]  ?amLODipine (NORVASC) 10 MG tablet Take 1 tablet (10 mg total) by mouth once daily. 06/16/21 09/15/21  Iloabachie, Chioma E, NP  ?Biotin w/ Vitamins C & E (HAIR SKIN & NAILS GUMMIES PO) Take 2 tablets by mouth daily.    [provider]  ?Blood Glucose Monitoring Suppl (RIGHTEST O6331619 BLOOD GLUCOSE) w/Device KIT Use up to four times daily as directed to check blood sugar. 06/16/21   Iloabachie, Chioma E, NP  ?Blood Pressure KIT USE AS DIRECTED. 11/17/20   Iloabachie, Chioma E, NP  ?CALCITRIOL IV Inject 2 mcg into the vein 3 (three) times a week.    [provider]  ?diphenhydrAMINE (BENADRYL) 25 MG tablet Take 25 mg by mouth daily as needed (before dialysis). Taking as needed    [provider]  ?epoetin alfa (EPOGEN) 10000 UNIT/ML injection Inject 1,400 Units into the vein 3 (three) times a week.    [provider]  ?fluconazole (DIFLUCAN) 150 MG tablet TAKE 1 TABLET BY MOUTH ONCE DAILY. 07/05/21     ?furosemide (LASIX) 80 MG tablet Take 1 tablet by mouth twice daily. 06/18/21     ?glucose blood (RIGHTEST GS550 BLOOD GLUCOSE) test strip USE AS DIRECTED UP TO FOUR  TIMES DAILY. 12/24/20   Iloabachie, Chioma E, NP  ?Insulin Glargine (BASAGLAR KWIKPEN) 100 UNIT/ML Inject 14 Units into the skin once daily at bedtime. ?Patient taking differently: Inject 12 Units into the skin at bedtime. 12/24/20   Iloabachie, Chioma E, NP  ?Insulin Pen Needle 32G X 4 MM MISC USE AS DIRECTED ONCE DAILY AT BEDTIME. 12/24/20   Iloabachie, Chioma E, NP  ?Iron Sucrose (VENOFER IV) Inject 100 mg into the vein 3 (three) times a week.    [provider]  ?lidocaine-prilocaine (EMLA) cream Apply 1 application topically to the affected area(s) daily as needed. 02/04/21   Iloabachie, Chioma E, NP  ?metoprolol succinate (TOPROL-XL) 50 MG 24 hr  tablet Take 1 tablet (50 mg total) by mouth once daily. Take with or immediately following a meal. 06/16/21   Iloabachie, Chioma E, NP  ?omeprazole (PRILOSEC) 20 MG capsule Take 1 capsule (20 mg total) by mouth once daily. 05/26/21     ?Rightest GL300 Lancets MISC USE AS DIRECTED UP TO FOUR TIMES DAILY. 12/24/20   Iloabachie, Chioma E, NP  ?sevelamer carbonate (RENVELA) 800 MG tablet TAKE ONE TABLET BY MOUTH 3 TIMES DAILY WITH MEALS. ?Patient taking differently: Take 1,600 mg by mouth with breakfast, with lunch, and with evening meal. 03/29/21     ?sevelamer carbonate (RENVELA) 800 MG tablet Take 2 tablets (1,600 mg total) by mouth 3 (three) times daily with a meal. 07/14/21     ?esomeprazole (NEXIUM) 20 MG capsule Take 1.0 Capsule Delayed Release Oral once a day 06/30/21 06/30/21    ? ?  ? ?VITAL SIGNS:  ?Blood pressure (!) 144/82, pulse 69, temperature 97.8 ?F (36.6 ?C), temperature source Oral, resp. rate 20, height _0  (1.549 m), weight 122.5 kg, last menstrual period 07/14/2021, SpO2 100 %. ? ?PHYSICAL EXAMINATION:  ?Physical Exam ? ?GENERAL:  46 y.o.-year-old Hispanic patient lying in the bed with no acute distress.  ?EYES: Pupils equal, round, reactive to light and accommodation. No scleral icterus. Extraocular muscles intact.  ?HEENT: Head atraumatic,  normocephalic. Oropharynx and nasopharynx clear.  ?NECK:  Supple, no jugular venous distention. No thyroid enlargement, no tenderness.  ?LUNGS: Normal breath sounds bilaterally, no wheezing, rales,rhonchi or crepitation. No

## 2021-08-01 ENCOUNTER — Encounter: Payer: Self-pay | Admitting: Family Medicine

## 2021-08-01 DIAGNOSIS — F419 Anxiety disorder, unspecified: Secondary | ICD-10-CM | POA: Diagnosis present

## 2021-08-01 DIAGNOSIS — Z992 Dependence on renal dialysis: Secondary | ICD-10-CM | POA: Diagnosis not present

## 2021-08-01 DIAGNOSIS — T82590A Other mechanical complication of surgically created arteriovenous fistula, initial encounter: Secondary | ICD-10-CM | POA: Diagnosis not present

## 2021-08-01 DIAGNOSIS — Z6841 Body Mass Index (BMI) 40.0 and over, adult: Secondary | ICD-10-CM | POA: Diagnosis not present

## 2021-08-01 DIAGNOSIS — F32A Depression, unspecified: Secondary | ICD-10-CM | POA: Diagnosis present

## 2021-08-01 DIAGNOSIS — J45909 Unspecified asthma, uncomplicated: Secondary | ICD-10-CM | POA: Diagnosis present

## 2021-08-01 DIAGNOSIS — Z9049 Acquired absence of other specified parts of digestive tract: Secondary | ICD-10-CM | POA: Diagnosis not present

## 2021-08-01 DIAGNOSIS — Z833 Family history of diabetes mellitus: Secondary | ICD-10-CM | POA: Diagnosis not present

## 2021-08-01 DIAGNOSIS — N2581 Secondary hyperparathyroidism of renal origin: Secondary | ICD-10-CM | POA: Diagnosis present

## 2021-08-01 DIAGNOSIS — E1122 Type 2 diabetes mellitus with diabetic chronic kidney disease: Secondary | ICD-10-CM | POA: Diagnosis present

## 2021-08-01 DIAGNOSIS — Y712 Prosthetic and other implants, materials and accessory cardiovascular devices associated with adverse incidents: Secondary | ICD-10-CM | POA: Diagnosis present

## 2021-08-01 DIAGNOSIS — T82898A Other specified complication of vascular prosthetic devices, implants and grafts, initial encounter: Secondary | ICD-10-CM | POA: Diagnosis present

## 2021-08-01 DIAGNOSIS — E1165 Type 2 diabetes mellitus with hyperglycemia: Secondary | ICD-10-CM | POA: Diagnosis present

## 2021-08-01 DIAGNOSIS — Z79899 Other long term (current) drug therapy: Secondary | ICD-10-CM | POA: Diagnosis not present

## 2021-08-01 DIAGNOSIS — Z888 Allergy status to other drugs, medicaments and biological substances status: Secondary | ICD-10-CM | POA: Diagnosis not present

## 2021-08-01 DIAGNOSIS — D631 Anemia in chronic kidney disease: Secondary | ICD-10-CM | POA: Diagnosis present

## 2021-08-01 DIAGNOSIS — I120 Hypertensive chronic kidney disease with stage 5 chronic kidney disease or end stage renal disease: Secondary | ICD-10-CM | POA: Diagnosis present

## 2021-08-01 DIAGNOSIS — E875 Hyperkalemia: Secondary | ICD-10-CM | POA: Diagnosis present

## 2021-08-01 DIAGNOSIS — K219 Gastro-esophageal reflux disease without esophagitis: Secondary | ICD-10-CM | POA: Diagnosis present

## 2021-08-01 DIAGNOSIS — Z794 Long term (current) use of insulin: Secondary | ICD-10-CM | POA: Diagnosis not present

## 2021-08-01 DIAGNOSIS — T8241XA Breakdown (mechanical) of vascular dialysis catheter, initial encounter: Secondary | ICD-10-CM | POA: Diagnosis present

## 2021-08-01 DIAGNOSIS — I2510 Atherosclerotic heart disease of native coronary artery without angina pectoris: Secondary | ICD-10-CM | POA: Diagnosis present

## 2021-08-01 DIAGNOSIS — N186 End stage renal disease: Secondary | ICD-10-CM | POA: Diagnosis present

## 2021-08-01 DIAGNOSIS — E1151 Type 2 diabetes mellitus with diabetic peripheral angiopathy without gangrene: Secondary | ICD-10-CM | POA: Diagnosis present

## 2021-08-01 DIAGNOSIS — Y832 Surgical operation with anastomosis, bypass or graft as the cause of abnormal reaction of the patient, or of later complication, without mention of misadventure at the time of the procedure: Secondary | ICD-10-CM | POA: Diagnosis present

## 2021-08-01 LAB — BASIC METABOLIC PANEL
Anion gap: 12 (ref 5–15)
BUN: 35 mg/dL — ABNORMAL HIGH (ref 6–20)
CO2: 29 mmol/L (ref 22–32)
Calcium: 7.7 mg/dL — ABNORMAL LOW (ref 8.9–10.3)
Chloride: 95 mmol/L — ABNORMAL LOW (ref 98–111)
Creatinine, Ser: 7.67 mg/dL — ABNORMAL HIGH (ref 0.44–1.00)
GFR, Estimated: 6 mL/min — ABNORMAL LOW (ref >60)
Glucose, Bld: 172 mg/dL — ABNORMAL HIGH (ref 70–99)
Potassium: 4.5 mmol/L (ref 3.5–5.1)
Sodium: 136 mmol/L (ref 135–145)

## 2021-08-01 LAB — CBC
HCT: 29.4 % — ABNORMAL LOW (ref 36.0–46.0)
Hemoglobin: 9.9 g/dL — ABNORMAL LOW (ref 12.0–15.0)
MCH: 31.3 pg (ref 26.0–34.0)
MCHC: 33.7 g/dL (ref 30.0–36.0)
MCV: 93 fL (ref 80.0–100.0)
Platelets: 269 10*3/uL (ref 150–400)
RBC: 3.16 MIL/uL — ABNORMAL LOW (ref 3.87–5.11)
RDW: 13 % (ref 11.5–15.5)
WBC: 9 10*3/uL (ref 4.0–10.5)
nRBC: 0 % (ref 0.0–0.2)

## 2021-08-01 LAB — GLUCOSE, CAPILLARY
Glucose-Capillary: 218 mg/dL — ABNORMAL HIGH (ref 70–99)
Glucose-Capillary: 215 mg/dL — ABNORMAL HIGH (ref 70–99)

## 2021-08-01 LAB — HEPATITIS B SURFACE ANTIBODY,QUALITATIVE: Hep B S Ab: NONREACTIVE

## 2021-08-01 LAB — HEPATITIS B SURFACE ANTIGEN: Hepatitis B Surface Ag: NONREACTIVE

## 2021-08-01 MED ORDER — INSULIN ASPART 100 UNIT/ML IJ SOLN
0.0000 [IU] | Freq: Three times a day (TID) | INTRAMUSCULAR | Status: DC
  Administered 2021-08-01: 2 [IU] via SUBCUTANEOUS
  Administered 2021-08-02: 3 [IU] via SUBCUTANEOUS
  Administered 2021-08-02: 2 [IU] via SUBCUTANEOUS
  Filled 2021-08-01 (×3): qty 1

## 2021-08-01 NOTE — Progress Notes (Signed)
? ? ? ?Progress Note  ? ? ?[name]  XNT:[id number] DOB: 22-Dec-1975  DOA: 07/31/2021 ?PCP: System, Provider Not In  ? ? ? ? ?Brief Narrative:  ? ? ?Medical records reviewed and are as summarized below: ? ?[name] is a 46 y.o. female with medical history significant for ESRD on hemodialysis on Mondays, Wednesdays and Fridays, type 2 diabetes mellitus, hypertension, asthma, depression, GERD, who was brought to the hospital from the outpatient hemodialysis center because of problems with left arm AV fistula.  She was unable to have dialysis on Friday, 07/30/2021 because left arm AV fistula was not accessible. ? ?Dr. Lorenso Courier, vascular surgeon, was consulted and she was able to ?assist dialysis nurse at the inpatient hemodialysis unit to assess the left arm AV fistula.  Patient had successful hemodialysis in the hospital. ? ? ?Assessment/Plan:  ? ?Principal Problem: ?  Problem with vascular access ?Active Problems: ?  End-stage renal disease on hemodialysis ([place]) ?  Essential hypertension ?  Type 2 diabetes mellitus with diabetic chronic kidney disease ([place]) ?  GERD without esophagitis ? ? ? ?Body mass index is 51.02 kg/m?.  (Morbid obesity) ? ?ESRD, difficulty accessing left arm AV fistula: Patient had successful hemodialysis yesterday.  Case discussed with Dr.Korrapati, nephrologist.  He wants patient to stay overnight for hemodialysis tomorrow to ensure that patient can have successful hemodialysis through left arm AV fistula prior to discharge. ? ?Type II DM: Continue insulin glargine. ? ?Hypertension: Continue antihypertensives ? ? ? ?Diet Order   ? ?       ?  Diet Heart Room service appropriate? Yes; Fluid consistency: Thin  Diet effective now       ?  ? ?  ?  ? ?  ? ? ? ? ? ? ?Consultants: ?Nephrologist ?Vascular surgeon ? ?Procedures: ?None ? ? ? ?Medications:  ? ? amLODipine  10 mg Oral Daily  ? [START ON 08/02/2021] calcitRIOL  2 mcg Intravenous Once per day on Mon Wed Fri  ? Chlorhexidine Gluconate  Cloth  6 each Topical Q0600  ? heparin  5,000 Units Subcutaneous Q8H  ? insulin glargine-yfgn  12 Units Subcutaneous QHS  ? metoprolol succinate  50 mg Oral Daily  ? pantoprazole  40 mg Oral Daily  ? sevelamer carbonate  1,600 mg Oral TID PC  ? ?Continuous Infusions: ? ? ?Anti-infectives (From admission, onward)  ? ? None  ? ?  ? ? ? ? ? ? ? ? ? ?Family Communication/Anticipated D/C date and plan/Code Status  ? ?DVT prophylaxis: heparin injection 5,000 Units Start: 07/31/21 1445 ? ? ?  Code Status: Full Code ? ?Family Communication: None ?Disposition Plan: Plan to discharge home tomorrow ? ? ?Status is: Inpatient ?Remains inpatient appropriate because: Plan for hemodialysis tomorrow using left arm AV fistula prior to discharge ? ? ? ? ? ? ?Subjective:  ? ?Interval events noted.  She was unhappy about being stuck multiple times in the outpatient dialysis center before being brought to the emergency room.  She states she is not interested in having any "hole in her stomach" for dialysis. ? ?Objective:  ? ? ?Vitals:  ? 08/01/21 0800 08/01/21 0900 08/01/21 0941 08/01/21 1216  ?BP: (!) 145/70 133/77 122/60 134/79  ?Pulse: 80 74 71 75  ?Resp: 16 17  16   ?Temp:    98.1 ?F (36.7 ?C)  ?TempSrc:    Oral  ?SpO2: 99% 98%  100%  ?Weight:      ?Height:      ? ?  No data found. ? ? ?Intake/Output Summary (Last 24 hours) at 08/01/2021 1515 ?Last data filed at 07/31/2021 1932 ?Gross per 24 hour  ?Intake --  ?Output 3003 ml  ?Net -3003 ml  ? ?Filed Weights  ? 07/31/21 1316  ?Weight: 122.5 kg  ? ? ?Exam: ? ?GEN: NAD ?SKIN: No rash.  Left arm AV fistula with audible bruit ?EYES: EOMI ?ENT: MMM ?CV: RRR ?PULM: CTA B ?ABD: soft, obese, NT, +BS ?CNS: AAO x 3, non focal ?EXT: No edema or tenderness ? ? ? ?  ? ? ?Data Reviewed:  ? ?I have personally reviewed following labs and imaging studies: ? ?Labs: ?Labs show the following:  ? ?Basic Metabolic Panel: ?Recent Labs  ?Lab 07/31/21 ?1332 07/31/21 ?1448 08/01/21 ?3818  ?NA 133* 134* 136  ?K 5.3*  3.7 4.5  ?CL 98 97* 95*  ?CO2 20* 25 29  ?GLUCOSE 287* 171* 172*  ?BUN 62* 35* 35*  ?CREATININE 11.55* 6.74* 7.67*  ?CALCIUM 7.4* 7.6* 7.7*  ?PHOS  --  3.8  --   ? ?GFR ?Estimated Creatinine Clearance: 11.2 mL/min (A) (by C-G formula based on SCr of 7.67 mg/dL (H)). ?Liver Function Tests: ?Recent Labs  ?Lab 07/31/21 ?1448  ?ALBUMIN 3.5  ? ?No results for input(s): LIPASE, AMYLASE in the last 168 hours. ?No results for input(s): AMMONIA in the last 168 hours. ?Coagulation profile ?No results for input(s): INR, PROTIME in the last 168 hours. ? ?CBC: ?Recent Labs  ?Lab 07/31/21 ?1332 07/31/21 ?1448 08/01/21 ?2993  ?WBC 11.0* 9.8 9.0  ?HGB 9.4* 9.7* 9.9*  ?HCT 28.8* 28.5* 29.4*  ?MCV 94.7 91.9 93.0  ?PLT 280 281 269  ? ?Cardiac Enzymes: ?No results for input(s): CKTOTAL, CKMB, CKMBINDEX, TROPONINI in the last 168 hours. ?BNP (last 3 results) ?No results for input(s): PROBNP in the last 8760 hours. ?CBG: ?No results for input(s): GLUCAP in the last 168 hours. ?D-Dimer: ?No results for input(s): DDIMER in the last 72 hours. ?Hgb A1c: ?No results for input(s): HGBA1C in the last 72 hours. ?Lipid Profile: ?No results for input(s): CHOL, HDL, LDLCALC, TRIG, CHOLHDL, LDLDIRECT in the last 72 hours. ?Thyroid function studies: ?No results for input(s): TSH, T4TOTAL, T3FREE, THYROIDAB in the last 72 hours. ? ?Invalid input(s): FREET3 ?Anemia work up: ?No results for input(s): VITAMINB12, FOLATE, FERRITIN, TIBC, IRON, RETICCTPCT in the last 72 hours. ?Sepsis Labs: ?Recent Labs  ?Lab 07/31/21 ?1332 07/31/21 ?1448 08/01/21 ?7169  ?WBC 11.0* 9.8 9.0  ? ? ?Microbiology ?No results found for this or any previous visit (from the past 240 hour(s)). ? ?Procedures and diagnostic studies: ? ?DG Chest Port 1 View ? ?Result Date: 07/31/2021 ?CLINICAL DATA:  Shortness of breath. Clogged access port for dialysis. Last dialysis on Wednesday on a Monday Wednesday Friday schedule. EXAM: PORTABLE CHEST 1 VIEW COMPARISON:  01/03/2021 FINDINGS:  Interval removal of right internal jugular dual-lumen central venous catheter. Cardiac silhouette is again mildly to moderately enlarged. Mediastinal contours are within normal limits. Mild bilateral interstitial thickening is similar to prior. No pleural effusion or pneumothorax. No acute skeletal abnormality. IMPRESSION: Cardiomegaly and mild interstitial pulmonary edema, likely slightly improved compared to 01/03/2021. Interval removal of right internal jugular central venous dialysis catheter. Electronically Signed   By: Yvonne Kendall M.D.   On: 07/31/2021 14:56   ? ? ? ? ? ? ? ? ? ? ? ? LOS: 0 days  ? ?Javarion Douty  ?[facility]  ? ?Pager on [email or web address]. If 7PM-7AM, please contact night-coverage at [email or web address] ? ? ? ? ?  08/01/2021, 3:15 PM  ? ? ? ? ? ? ? ? ? ?

## 2021-08-02 LAB — GLUCOSE, CAPILLARY
Glucose-Capillary: 202 mg/dL — ABNORMAL HIGH (ref 70–99)
Glucose-Capillary: 207 mg/dL — ABNORMAL HIGH (ref 70–99)
Glucose-Capillary: 293 mg/dL — ABNORMAL HIGH (ref 70–99)
Glucose-Capillary: 297 mg/dL — ABNORMAL HIGH (ref 70–99)
Glucose-Capillary: 303 mg/dL — ABNORMAL HIGH (ref 70–99)

## 2021-08-02 MED ORDER — INSULIN ASPART 100 UNIT/ML IJ SOLN
0.0000 [IU] | Freq: Three times a day (TID) | INTRAMUSCULAR | Status: DC
  Administered 2021-08-03 (×2): 2 [IU] via SUBCUTANEOUS
  Administered 2021-08-03 – 2021-08-04 (×2): 3 [IU] via SUBCUTANEOUS
  Administered 2021-08-04: 5 [IU] via SUBCUTANEOUS
  Administered 2021-08-04: 3 [IU] via SUBCUTANEOUS
  Administered 2021-08-05: 5 [IU] via SUBCUTANEOUS
  Administered 2021-08-05: 2 [IU] via SUBCUTANEOUS
  Filled 2021-08-02 (×8): qty 1

## 2021-08-02 MED ORDER — INSULIN ASPART 100 UNIT/ML IJ SOLN
3.0000 [IU] | Freq: Once | INTRAMUSCULAR | Status: AC
  Administered 2021-08-02: 3 [IU] via SUBCUTANEOUS
  Filled 2021-08-02: qty 1

## 2021-08-02 MED ORDER — PENTAFLUOROPROP-TETRAFLUOROETH EX AERO
INHALATION_SPRAY | CUTANEOUS | Status: DC | PRN
  Filled 2021-08-02: qty 30

## 2021-08-02 MED ORDER — ALPRAZOLAM 0.25 MG PO TABS
0.2500 mg | ORAL_TABLET | Freq: Two times a day (BID) | ORAL | Status: AC | PRN
  Administered 2021-08-02 – 2021-08-03 (×2): 0.25 mg via ORAL
  Filled 2021-08-02 (×2): qty 1

## 2021-08-02 MED ORDER — DIPHENHYDRAMINE HCL 25 MG PO CAPS
25.0000 mg | ORAL_CAPSULE | Freq: Once | ORAL | Status: AC | PRN
  Administered 2021-08-02: 25 mg via ORAL
  Filled 2021-08-02: qty 1

## 2021-08-02 NOTE — Progress Notes (Signed)
? ? ? ?Progress Note  ? ? ?[name]  BHA:[id number] DOB: 06-27-1975  DOA: 07/31/2021 ?PCP: System, Provider Not In  ? ? ? ? ?Brief Narrative:  ? ? ?Medical records reviewed and are as summarized below: ? ?[name] is a 46 y.o. female with medical history significant for ESRD on hemodialysis on Mondays, Wednesdays and Fridays, type 2 diabetes mellitus, hypertension, asthma, depression, GERD, who was brought to the hospital from the outpatient hemodialysis center because of problems with left arm AV fistula.  She was unable to have dialysis on Friday, 07/30/2021 because left arm AV fistula was not accessible. ? ?Dr. Lorenso Courier, vascular surgeon, was consulted and she was able to ?assist dialysis nurse at the inpatient hemodialysis unit to assess the left arm AV fistula.  Patient had successful hemodialysis in the hospital. ? ? ?Assessment/Plan:  ? ?Principal Problem: ?  Problem with vascular access ?Active Problems: ?  End-stage renal disease on hemodialysis ([place]) ?  Essential hypertension ?  Type 2 diabetes mellitus with diabetic chronic kidney disease ([place]) ?  GERD without esophagitis ? ? ? ?Body mass index is 50.57 kg/m?.  (Morbid obesity) ? ?ESRD, difficulty accessing left arm AV fistula: She had hemodialysis today using left AV fistula.  However, there are still some concerns that fistula is deep and tortuous.  Vascular surgeon has been consulted with plan for permacath placement tomorrow. ? ?Anxiety: Xanax as needed for anxiety ? ?Type II DM with hyperglycemia: Continue insulin glargine.  Use NovoLog as needed for hyperglycemia ? ?Hypertension: Continue antihypertensives ? ? ? ?Diet Order   ? ?       ?  Diet Carb Modified Fluid consistency: Thin; Room service appropriate? Yes  Diet effective now       ?  ? ?  ?  ? ?  ? ? ? ? ? ? ?Consultants: ?Nephrologist ?Vascular surgeon ? ?Procedures: ?None ? ? ? ?Medications:  ? ? amLODipine  10 mg Oral Daily  ? calcitRIOL  2 mcg Intravenous Once per day on Mon Wed Fri   ? heparin  5,000 Units Subcutaneous Q8H  ? [START ON 08/03/2021] insulin aspart  0-15 Units Subcutaneous TID WC  ? insulin glargine-yfgn  12 Units Subcutaneous QHS  ? metoprolol succinate  50 mg Oral Daily  ? pantoprazole  40 mg Oral Daily  ? sevelamer carbonate  1,600 mg Oral TID PC  ? ?Continuous Infusions: ? ? ?Anti-infectives (From admission, onward)  ? ? None  ? ?  ? ? ? ? ? ? ? ? ? ?Family Communication/Anticipated D/C date and plan/Code Status  ? ?DVT prophylaxis: heparin injection 5,000 Units Start: 07/31/21 1445 ? ? ?  Code Status: Full Code ? ?Family Communication: None ?Disposition Plan: Plan to discharge home tomorrow ? ? ?Status is: Inpatient ?Remains inpatient appropriate because: Plan for hemodialysis tomorrow using left arm AV fistula prior to discharge ? ? ? ? ? ? ?Subjective:  ? ?Interval events noted.  Patient was upset that she would have to get another permacath tomorrow.  She complains of anxiety and wants something to calm her nerves.  [name], [name], was at the bedside. ? ?Objective:  ? ? ?Vitals:  ? 08/02/21 1200 08/02/21 1233 08/02/21 1303 08/02/21 1531  ?BP: 126/74  (!) 149/83 (!) 168/90  ?Pulse: 75 77 73 72  ?Resp: (!) 21 20 16 16   ?Temp: 98.8 ?F (37.1 ?C)  98.2 ?F (36.8 ?C) 98 ?F (36.7 ?C)  ?TempSrc: Oral   Oral  ?SpO2: 100%  100% 98% 100%  ?Weight: 121.4 kg     ?Height:      ? ?No data found. ? ? ?Intake/Output Summary (Last 24 hours) at 08/02/2021 1633 ?Last data filed at 08/02/2021 1200 ?Gross per 24 hour  ?Intake 480 ml  ?Output 3000 ml  ?Net -2520 ml  ? ?Filed Weights  ? 07/31/21 1316 08/02/21 0829 08/02/21 1200  ?Weight: 122.5 kg 123.7 kg 121.4 kg  ? ? ?Exam: ? ?GEN: NAD ?SKIN: No rash ?EYES: EOMI ?ENT: MMM ?CV: RRR ?PULM: CTA B ?ABD: soft, obese, NT, +BS ?CNS: AAO x 3, non focal ?EXT: No edema or tenderness.  Left arm AV fistula with audible bruit ? ? ? ?  ? ? ?Data Reviewed:  ? ?I have personally reviewed following labs and imaging studies: ? ?Labs: ?Labs show the following:  ? ?Basic  Metabolic Panel: ?Recent Labs  ?Lab 07/31/21 ?1332 07/31/21 ?1448 08/01/21 ?6294  ?NA 133* 134* 136  ?K 5.3* 3.7 4.5  ?CL 98 97* 95*  ?CO2 20* 25 29  ?GLUCOSE 287* 171* 172*  ?BUN 62* 35* 35*  ?CREATININE 11.55* 6.74* 7.67*  ?CALCIUM 7.4* 7.6* 7.7*  ?PHOS  --  3.8  --   ? ?GFR ?Estimated Creatinine Clearance: 11.2 mL/min (A) (by C-G formula based on SCr of 7.67 mg/dL (H)). ?Liver Function Tests: ?Recent Labs  ?Lab 07/31/21 ?1448  ?ALBUMIN 3.5  ? ?No results for input(s): LIPASE, AMYLASE in the last 168 hours. ?No results for input(s): AMMONIA in the last 168 hours. ?Coagulation profile ?No results for input(s): INR, PROTIME in the last 168 hours. ? ?CBC: ?Recent Labs  ?Lab 07/31/21 ?1332 07/31/21 ?1448 08/01/21 ?7654  ?WBC 11.0* 9.8 9.0  ?HGB 9.4* 9.7* 9.9*  ?HCT 28.8* 28.5* 29.4*  ?MCV 94.7 91.9 93.0  ?PLT 280 281 269  ? ?Cardiac Enzymes: ?No results for input(s): CKTOTAL, CKMB, CKMBINDEX, TROPONINI in the last 168 hours. ?BNP (last 3 results) ?No results for input(s): PROBNP in the last 8760 hours. ?CBG: ?Recent Labs  ?Lab 08/01/21 ?1635 08/01/21 ?2106 08/02/21 ?0752 08/02/21 ?1303  ?GLUCAP 218* 215* 202* 293*  ? ?D-Dimer: ?No results for input(s): DDIMER in the last 72 hours. ?Hgb A1c: ?No results for input(s): HGBA1C in the last 72 hours. ?Lipid Profile: ?No results for input(s): CHOL, HDL, LDLCALC, TRIG, CHOLHDL, LDLDIRECT in the last 72 hours. ?Thyroid function studies: ?No results for input(s): TSH, T4TOTAL, T3FREE, THYROIDAB in the last 72 hours. ? ?Invalid input(s): FREET3 ?Anemia work up: ?No results for input(s): VITAMINB12, FOLATE, FERRITIN, TIBC, IRON, RETICCTPCT in the last 72 hours. ?Sepsis Labs: ?Recent Labs  ?Lab 07/31/21 ?1332 07/31/21 ?1448 08/01/21 ?6503  ?WBC 11.0* 9.8 9.0  ? ? ?Microbiology ?No results found for this or any previous visit (from the past 240 hour(s)). ? ?Procedures and diagnostic studies: ? ?No results found. ? ? ? ? ? ? ? ? ? ? ? ? LOS: 1 day  ? ?Kortlynn Poust  ?[facility]  ? ?Pager on [email or web address]. If 7PM-7AM, please contact night-coverage at [email or web address] ? ? ? ? ?08/02/2021, 4:33 PM  ? ? ? ? ? ? ? ? ? ?

## 2021-08-02 NOTE — Progress Notes (Signed)
Patient completes 3 hour treatment without incident, via LFA AVF. Targeted UF achieved with 3 liter fluid removal.  No excessive bleeding post treatment. VSS. No labs drawn, nor med's given this session. Patient to be seen by vascular for catheter placement. Pt. Returned to assigned room.  ?

## 2021-08-02 NOTE — Progress Notes (Addendum)
?[facility]  ?ROUNDING NOTE  ? ?Subjective:  ? ?Patient seen and evaluated during dialysis ?  ?HEMODIALYSIS FLOWSHEET: ? ?Blood Flow Rate (mL/min): 400 mL/min ?Arterial Pressure (mmHg): -160 mmHg ?Venous Pressure (mmHg): 210 mmHg ?Transmembrane Pressure (mmHg): 60 mmHg ?Ultrafiltration Rate (mL/min): 1160 mL/min ?Dialysate Flow Rate (mL/min): 600 ml/min ?Conductivity: Machine : 14.2 ?Conductivity: Machine : 14.2 ?Dialysis Fluid Bolus: Normal Saline ?Bolus Amount (mL): 250 mL ? ?No complaints at this time ?Successful HD accessed by dialysis tech ? ?Objective:  ?Vital signs in last 24 hours:  ?Temp:  [98.1 ?F (36.7 ?C)-98.8 ?F (37.1 ?C)] 98.8 ?F (37.1 ?C) (03/20 1610) ?Pulse Rate:  [69-81] 69 (03/20 1030) ?Resp:  [15-20] 17 (03/20 1030) ?BP: (132-171)/(26-95) 142/75 (03/20 1030) ?SpO2:  [98 %-100 %] 100 % (03/20 1030) ?Weight:  [123.7 kg] 123.7 kg (03/20 0829) ? ?Weight change:  ?Filed Weights  ? 07/31/21 1316 08/02/21 0829  ?Weight: 122.5 kg 123.7 kg  ? ? ?Intake/Output: ?I/O last 3 completed shifts: ?In: 480 [P.O.:480] ?Out: 3003 [Other:3003] ?  ?Intake/Output this shift: ? No intake/output data recorded. ? ?Physical Exam: ?General: NAD, resting in bed  ?Head: Normocephalic, atraumatic. Moist oral mucosal membranes  ?Eyes: Anicteric  ?Lungs:  Clear to auscultation, normal effort  ?Heart: Regular rate and rhythm  ?Abdomen:  Soft, nontender  ?Extremities:  no peripheral edema.  ?Neurologic: Nonfocal, moving all four extremities  ?Skin: No lesions  ?Access: Lt AVF  ? ? ?Basic Metabolic Panel: ?Recent Labs  ?Lab 07/31/21 ?1332 07/31/21 ?1448 08/01/21 ?9604  ?NA 133* 134* 136  ?K 5.3* 3.7 4.5  ?CL 98 97* 95*  ?CO2 20* 25 29  ?GLUCOSE 287* 171* 172*  ?BUN 62* 35* 35*  ?CREATININE 11.55* 6.74* 7.67*  ?CALCIUM 7.4* 7.6* 7.7*  ?PHOS  --  3.8  --   ? ? ?Liver Function Tests: ?Recent Labs  ?Lab 07/31/21 ?1448  ?ALBUMIN 3.5  ? ?No results for input(s): LIPASE, AMYLASE in the last 168 hours. ?No results for  input(s): AMMONIA in the last 168 hours. ? ?CBC: ?Recent Labs  ?Lab 07/31/21 ?1332 07/31/21 ?1448 08/01/21 ?5409  ?WBC 11.0* 9.8 9.0  ?HGB 9.4* 9.7* 9.9*  ?HCT 28.8* 28.5* 29.4*  ?MCV 94.7 91.9 93.0  ?PLT 280 281 269  ? ? ?Cardiac Enzymes: ?No results for input(s): CKTOTAL, CKMB, CKMBINDEX, TROPONINI in the last 168 hours. ? ?BNP: ?Invalid input(s): POCBNP ? ?CBG: ?Recent Labs  ?Lab 08/01/21 ?1635 08/01/21 ?2106 08/02/21 ?0752  ?GLUCAP 218* 215* 202*  ? ? ?Microbiology: ?Results for orders placed or performed in visit on 03/25/21  ?Microscopic Examination     Status: Abnormal  ? Collection Time: 03/25/21  3:30 PM  ? Urine  ?Result Value Ref Range Status  ? WBC, UA 6-10 (A) 0 - 5 /hpf Final  ? RBC None seen 0 - 2 /hpf Final  ? Epithelial Cells (non renal) >10 (A) 0 - 10 /hpf Final  ? Casts Present (A) None seen /lpf Final  ? Cast Type Granular casts (A) N/A Final  ? Bacteria, UA Many (A) None seen/Few Final  ? Yeast, UA Present (A) None seen Final  ?Urine Culture, Reflex     Status: None  ? Collection Time: 03/25/21  3:30 PM  ? Urine  ?Result Value Ref Range Status  ? Urine Culture, Routine Final report  Final  ? Organism ID, Bacteria Comment  Final  ?  Comment: Greater than 2 organisms recovered, none predominant. Please submit ?another sample if clinically indicated. ?50,000-100,000  colony forming units per mL ?  ? ? ?Coagulation Studies: ?No results for input(s): LABPROT, INR in the last 72 hours. ? ?Urinalysis: ?No results for input(s): COLORURINE, LABSPEC, [place], GLUCOSEU, HGBUR, BILIRUBINUR, KETONESUR, PROTEINUR, UROBILINOGEN, NITRITE, LEUKOCYTESUR in the last 72 hours. ? ?Invalid input(s): APPERANCEUR  ? ? ?Imaging: ?DG Chest Port 1 View ? ?Result Date: 07/31/2021 ?CLINICAL DATA:  Shortness of breath. Clogged access port for dialysis. Last dialysis on Wednesday on a Monday Wednesday Friday schedule. EXAM: PORTABLE CHEST 1 VIEW COMPARISON:  01/03/2021 FINDINGS: Interval removal of right internal jugular  dual-lumen central venous catheter. Cardiac silhouette is again mildly to moderately enlarged. Mediastinal contours are within normal limits. Mild bilateral interstitial thickening is similar to prior. No pleural effusion or pneumothorax. No acute skeletal abnormality. IMPRESSION: Cardiomegaly and mild interstitial pulmonary edema, likely slightly improved compared to 01/03/2021. Interval removal of right internal jugular central venous dialysis catheter. Electronically Signed   By: Yvonne Kendall M.D.   On: 07/31/2021 14:56   ? ? ?Medications:  ? ? ? amLODipine  10 mg Oral Daily  ? calcitRIOL  2 mcg Intravenous Once per day on Mon Wed Fri  ? heparin  5,000 Units Subcutaneous Q8H  ? insulin aspart  0-6 Units Subcutaneous TID WC  ? insulin glargine-yfgn  12 Units Subcutaneous QHS  ? metoprolol succinate  50 mg Oral Daily  ? pantoprazole  40 mg Oral Daily  ? sevelamer carbonate  1,600 mg Oral TID PC  ? ?acetaminophen **OR** acetaminophen, diphenhydrAMINE, magnesium hydroxide, ondansetron **OR** ondansetron (ZOFRAN) IV, pentafluoroprop-tetrafluoroeth, traZODone ? ?Assessment/ Plan:  ?[name] is a 46 y.o.  female with a history of hypertension, coronary artery disease, diabetes, peripheral vascular disease, end-stage renal disease and anemia now sent to the hospital because of inability to access her dialysis AV fistula on the left arm. ? ?[facility] [place]/MWF/Lt AVF ? ?End stage renal disease with malfunctioning HD access on dialysis. Vascular successfully accessed fistula on Saturday. Received treatment with 3L UF achieved. Dialysis tech able to access fistula today. Will contact vascular to determine schedule of fistulogram. Vascular plans to place permcath tomorrow and fistulogram at a later date.  ? ?2. Anemia of chronic kidney disease ?Normocytic ?Lab Results  ?Component Value Date  ? HGB 9.9 (L) 08/01/2021  ? Hgb at goal.  ? ?3. Secondary Hyperparathyroidism:  ?Lab Results  ?Component Value Date  ?  CALCIUM 7.7 (L) 08/01/2021  ? CAION 1.10 (L) 11/20/2020  ? PHOS 3.8 07/31/2021  ?  ?Calcium decreased. Phosphorus within range.  ?Continue sevelamer with meals ? ?4. Hypertension with chronic kidney disease. Currently prescribed amlodipine and metoprolol. BP stable ? ? ? LOS: 1 ?[place] ?3/20/202310:42 AM ?  ?

## 2021-08-02 NOTE — Progress Notes (Signed)
Patient educated that she will be NPO at midnight for procedure tomorrow. Patient told that means nothing buy mouth to eat or drink, patient verbalized understanding. ?

## 2021-08-03 ENCOUNTER — Inpatient Hospital Stay
Admission: EM | Disposition: A | Discharge status: Home / Self Care | Payer: Self-pay | Source: Home / Self Care | Attending: Internal Medicine

## 2021-08-03 DIAGNOSIS — T82590A Other mechanical complication of surgically created arteriovenous fistula, initial encounter: Secondary | ICD-10-CM

## 2021-08-03 HISTORY — PX: DIALYSIS/PERMA CATHETER INSERTION: CATH118288

## 2021-08-03 LAB — GLUCOSE, CAPILLARY
Glucose-Capillary: 158 mg/dL — ABNORMAL HIGH (ref 70–99)
Glucose-Capillary: 149 mg/dL — ABNORMAL HIGH (ref 70–99)
Glucose-Capillary: 127 mg/dL — ABNORMAL HIGH (ref 70–99)
Glucose-Capillary: 93 mg/dL (ref 70–99)
Glucose-Capillary: 153 mg/dL — ABNORMAL HIGH (ref 70–99)
Glucose-Capillary: 337 mg/dL — ABNORMAL HIGH (ref 70–99)

## 2021-08-03 LAB — HEPATITIS B SURFACE ANTIBODY, QUANTITATIVE: Hep B S AB Quant (Post): <3.1 m[IU]/mL — ABNORMAL LOW (ref >9.9)

## 2021-08-03 SURGERY — DIALYSIS/PERMA CATHETER INSERTION
Anesthesia: Moderate Sedation

## 2021-08-03 MED ORDER — SODIUM CHLORIDE 0.9 % IV SOLN: Freq: Once | INTRAVENOUS | Status: AC

## 2021-08-03 MED ORDER — CEFAZOLIN SODIUM-DEXTROSE 2-4 GM/100ML-% IV SOLN
2.0000 g | INTRAVENOUS | Status: AC
  Filled 2021-08-03: qty 100

## 2021-08-03 MED ORDER — FENTANYL CITRATE PF 50 MCG/ML IJ SOSY
PREFILLED_SYRINGE | INTRAMUSCULAR | Status: AC
  Filled 2021-08-03: qty 1

## 2021-08-03 MED ORDER — SENNA 8.6 MG PO TABS: 1.0000 | ORAL_TABLET | Freq: Every evening | ORAL | Status: DC | PRN

## 2021-08-03 MED ORDER — FENTANYL CITRATE (PF) 100 MCG/2ML IJ SOLN
INTRAMUSCULAR | Status: DC | PRN
  Administered 2021-08-03: 25 ug via INTRAVENOUS
  Administered 2021-08-03: 50 ug via INTRAVENOUS
  Administered 2021-08-03: 25 ug via INTRAVENOUS

## 2021-08-03 MED ORDER — FENTANYL CITRATE PF 50 MCG/ML IJ SOSY
PREFILLED_SYRINGE | INTRAMUSCULAR | Status: AC
Start: 2021-08-03 — End: 2021-08-04
  Filled 2021-08-03: qty 1

## 2021-08-03 MED ORDER — SENNA 8.6 MG PO TABS
1.0000 | ORAL_TABLET | Freq: Every evening | ORAL | Status: DC | PRN
  Administered 2021-08-03: 8.6 mg via ORAL
  Filled 2021-08-03: qty 1

## 2021-08-03 MED ORDER — DIPHENHYDRAMINE HCL 25 MG PO CAPS
25.0000 mg | ORAL_CAPSULE | Freq: Three times a day (TID) | ORAL | Status: DC | PRN
  Administered 2021-08-04 – 2021-08-05 (×3): 25 mg via ORAL
  Filled 2021-08-03 (×3): qty 1

## 2021-08-03 MED ORDER — MIDAZOLAM HCL 2 MG/2ML IJ SOLN
INTRAMUSCULAR | Status: DC | PRN
  Administered 2021-08-03: 2 mg via INTRAVENOUS
  Administered 2021-08-03: .5 mg via INTRAVENOUS
  Administered 2021-08-03: 1 mg via INTRAVENOUS

## 2021-08-03 MED ORDER — OXYCODONE HCL 5 MG PO TABS
5.0000 mg | ORAL_TABLET | Freq: Once | ORAL | Status: AC
  Administered 2021-08-03: 5 mg via ORAL
  Filled 2021-08-03: qty 1

## 2021-08-03 MED ORDER — MIDAZOLAM HCL 2 MG/2ML IJ SOLN
INTRAMUSCULAR | Status: AC
  Filled 2021-08-03: qty 2

## 2021-08-03 MED ORDER — DIPHENHYDRAMINE HCL 25 MG PO TABS
25.0000 mg | ORAL_TABLET | Freq: Three times a day (TID) | ORAL | Status: DC | PRN
  Administered 2021-08-03 (×2): 25 mg via ORAL
  Filled 2021-08-03 (×4): qty 1

## 2021-08-03 MED ORDER — ACETAMINOPHEN 325 MG PO TABS
ORAL_TABLET | ORAL | Status: AC
  Filled 2021-08-03: qty 2

## 2021-08-03 SURGICAL SUPPLY — 9 items
CANNULA 5F STIFF (CANNULA) ×1 IMPLANT
CATH CANNON HEMO 15FR 19 (HEMODIALYSIS SUPPLIES) ×1 IMPLANT
COVER PROBE U/S 5X48 (MISCELLANEOUS) ×1 IMPLANT
DERMABOND ADVANCED (GAUZE/BANDAGES/DRESSINGS) ×1
DERMABOND ADVANCED .7 DNX12 (GAUZE/BANDAGES/DRESSINGS) IMPLANT
DRAPE INCISE IOBAN 66X45 STRL (DRAPES) ×1 IMPLANT
PACK ANGIOGRAPHY (CUSTOM PROCEDURE TRAY) ×1 IMPLANT
SUT MNCRL AB 4-0 PS2 18 (SUTURE) ×1 IMPLANT
SUT SILK 0 FSL (SUTURE) ×1 IMPLANT

## 2021-08-03 NOTE — Interval H&P Note (Signed)
History and Physical Interval Note: ? ?08/03/2021 ?4:15 PM ? ?Bronwyn Belasco  has presented today for surgery, with the diagnosis of complication of dialysis access; end stage renal disease.  The various methods of treatment have been discussed with the patient and family. After consideration of risks, benefits and other options for treatment, the patient has consented to  Procedure(s): ?DIALYSIS/PERMA CATHETER INSERTION (N/A) as a surgical intervention.  The patient's history has been reviewed, patient examined, no change in status, stable for surgery.  I have reviewed the patient's chart and labs.  Questions were answered to the patient's satisfaction.   ? ? ?[name] ? ? ?

## 2021-08-03 NOTE — Progress Notes (Signed)
Inpatient Diabetes Program Recommendations ? ?AACE/ADA: New Consensus Statement on Inpatient Glycemic Control (2015) ? ?Target Ranges:  Prepandial:   less than 140 mg/dL ?     Peak postprandial:   less than 180 mg/dL (1-2 hours) ?     Critically ill patients:  140 - 180 mg/dL  ? ?Lab Results  ?Component Value Date  ? GLUCAP 158 (H) 08/03/2021  ? HGBA1C 7.9 (H) 06/10/2021  ? ? ?Review of Glycemic Control ? Latest Reference Range & Units 08/02/21 21:08 08/02/21 23:35 08/03/21 07:20  ?Glucose-Capillary 70 - 99 mg/dL 303 (H) 297 (H) 158 (H)  ?(H): Data is abnormally high ?Diabetes history: Type 2 Dm ?Outpatient Diabetes medications: Basaglar 14 units QHS ?Current orders for Inpatient glycemic control: Novolog 0-15 units TID, Semglee 12 units QHS ? ?Inpatient Diabetes Program Recommendations:   ? ?Noted patient missed sliding scale 1700 dose due to order change, thus explaining increase at bedtime. Novolog 3 units x 1 given to cover.  ?This AM sliding scale was missed due to patient being NPO. RN notified per secure chat, encouraged to repeat CBG and administer based on sliding scale.  ? ?Following.  ? ?Thanks, ?Bronson Curb, MSN, RNC-OB ?Diabetes Coordinator ?[phone] (8a-5p) ? ? ? ? ?

## 2021-08-03 NOTE — Op Note (Signed)
OPERATIVE NOTE ? ? ?PROCEDURE: ?Insertion of tunneled dialysis catheter right IJ approach with ultrasound and fluoroscopic guidance. ? ?PRE-OPERATIVE DIAGNOSIS: End-stage renal disease requiring hemodialysis; complication dialysis access with difficulty cannulating ? ?POST-OPERATIVE DIAGNOSIS: Same ? ?SURGEON: [name]. ? ?ANESTHESIA: Conscious sedation was administered under my direct supervision by the interventional radiology RN. IV Versed plus fentanyl were utilized. Continuous ECG, pulse oximetry and blood pressure was monitored throughout the entire procedure. Conscious sedation was for a total of 36 minutes. ? ?ESTIMATED BLOOD LOSS: Minimal cc ? ?CONTRAST USED:  None ? ?FLUOROSCOPY TIME: 0.1 minutes ? ?INDICATIONS:   ?[name] a 46 y.o. y.o. female who presents with a functioning left arm brachiocephalic fistula but there is significant difficulty with cannulation and she is not able to maintain her dialysis schedule.  She will require superficialization of her left arm fistula and therefore a tunnel cath will be required while the revision heals.  Risk and benefits of been reviewed all questions answered patient agrees to proceed. ? ?DESCRIPTION: ?After obtaining full informed written consent, the patient was positioned supine. The right neck and chest wall was prepped and draped in a sterile fashion. Ultrasound was placed in a sterile sleeve. Ultrasound was utilized to identify the right internal jugular vein which is noted to be echolucent and compressible indicating patency. Image is recorded for the permanent record. Under direct ultrasound visualization a micro-needle is inserted into the vein followed by the micro-wire. Micro-sheath was then advanced and a J wire is inserted without difficulty under fluoroscopic guidance. Small counterincision was made at the wire insertion site. Dilators are passed over the wire and the tunneled dialysis catheter is fed into the central venous system  without difficulty. ? ?Under fluoroscopy the catheter tip positioned at the atrial caval junction. The catheter is then approximated to the right chest wall and an exit site selected. 1% lidocaine is infiltrated in soft tissues at this level small incision is made and the tunneling device is then passed from the exit site to the right neck counterincision. Catheter is then connected to the tunneling device and the catheter was pulled subcutaneously. It is then transected and the hub assembly connected without difficulty. Both lumens aspirate and flush easily. After verification of smooth contour with proper tip position under fluoroscopy the catheter is packed with 5000 units of heparin per lumen. ? ?Catheter secured to the skin of the right chest wall with 0 silk. A sterile dressing is applied with a Biopatch. ? ?COMPLICATIONS: None ? ?CONDITION: Good ? ?[name] ?[facility]. ?Office:  [phone]  ? ?08/03/2021,5:06 PM ? ?  ?

## 2021-08-03 NOTE — Progress Notes (Signed)
?[facility]  ?ROUNDING NOTE  ? ?Subjective:  ? ?Patient resting quietly ?Complains of itching, states she hasn't taken shower since admission.  ?Requesting benadryl for itching ?Currently NPO for permcath placement ? ? ?Objective:  ?Vital signs in last 24 hours:  ?Temp:  [98 ?F (36.7 ?C)-98.8 ?F (37.1 ?C)] 98.1 ?F (36.7 ?C) (03/21 0719) ?Pulse Rate:  [68-83] 73 (03/21 0719) ?Resp:  [16-21] 18 (03/21 0719) ?BP: (105-168)/(71-90) 139/83 (03/21 0719) ?SpO2:  [98 %-100 %] 100 % (03/21 0719) ?Weight:  [121.4 kg] 121.4 kg (03/20 1200) ? ?Weight change:  ?Filed Weights  ? 07/31/21 1316 08/02/21 0829 08/02/21 1200  ?Weight: 122.5 kg 123.7 kg 121.4 kg  ? ? ?Intake/Output: ?I/O last 3 completed shifts: ?In: 480 [P.O.:480] ?Out: 3000 [Other:3000] ?  ?Intake/Output this shift: ? No intake/output data recorded. ? ?Physical Exam: ?General: NAD, resting in bed  ?Head: Normocephalic, atraumatic. Moist oral mucosal membranes  ?Eyes: Anicteric  ?Lungs:  Clear to auscultation, normal effort  ?Heart: Regular rate and rhythm  ?Abdomen:  Soft, nontender  ?Extremities:  no peripheral edema.  ?Neurologic: Nonfocal, moving all four extremities  ?Skin: No lesions  ?Access: Lt AVF  ? ? ?Basic Metabolic Panel: ?Recent Labs  ?Lab 07/31/21 ?1332 07/31/21 ?1448 08/01/21 ?2836  ?NA 133* 134* 136  ?K 5.3* 3.7 4.5  ?CL 98 97* 95*  ?CO2 20* 25 29  ?GLUCOSE 287* 171* 172*  ?BUN 62* 35* 35*  ?CREATININE 11.55* 6.74* 7.67*  ?CALCIUM 7.4* 7.6* 7.7*  ?PHOS  --  3.8  --   ? ? ? ?Liver Function Tests: ?Recent Labs  ?Lab 07/31/21 ?1448  ?ALBUMIN 3.5  ? ? ?No results for input(s): LIPASE, AMYLASE in the last 168 hours. ?No results for input(s): AMMONIA in the last 168 hours. ? ?CBC: ?Recent Labs  ?Lab 07/31/21 ?1332 07/31/21 ?1448 08/01/21 ?6294  ?WBC 11.0* 9.8 9.0  ?HGB 9.4* 9.7* 9.9*  ?HCT 28.8* 28.5* 29.4*  ?MCV 94.7 91.9 93.0  ?PLT 280 281 269  ? ? ? ?Cardiac Enzymes: ?No results for input(s): CKTOTAL, CKMB, CKMBINDEX, TROPONINI in the last  168 hours. ? ?BNP: ?Invalid input(s): POCBNP ? ?CBG: ?Recent Labs  ?Lab 08/02/21 ?1639 08/02/21 ?2108 08/02/21 ?2335 08/03/21 ?0720 08/03/21 ?0938  ?GLUCAP 207* 303* 297* 158* 149*  ? ? ? ?Microbiology: ?Results for orders placed or performed in visit on 03/25/21  ?Microscopic Examination     Status: Abnormal  ? Collection Time: 03/25/21  3:30 PM  ? Urine  ?Result Value Ref Range Status  ? WBC, UA 6-10 (A) 0 - 5 /hpf Final  ? RBC None seen 0 - 2 /hpf Final  ? Epithelial Cells (non renal) >10 (A) 0 - 10 /hpf Final  ? Casts Present (A) None seen /lpf Final  ? Cast Type Granular casts (A) N/A Final  ? Bacteria, UA Many (A) None seen/Few Final  ? Yeast, UA Present (A) None seen Final  ?Urine Culture, Reflex     Status: None  ? Collection Time: 03/25/21  3:30 PM  ? Urine  ?Result Value Ref Range Status  ? Urine Culture, Routine Final report  Final  ? Organism ID, Bacteria Comment  Final  ?  Comment: Greater than 2 organisms recovered, none predominant. Please submit ?another sample if clinically indicated. ?50,000-100,000 colony forming units per mL ?  ? ? ?Coagulation Studies: ?No results for input(s): LABPROT, INR in the last 72 hours. ? ?Urinalysis: ?No results for input(s): COLORURINE, LABSPEC, [place], [place], [place], BILIRUBINUR, KETONESUR,  PROTEINUR, UROBILINOGEN, NITRITE, LEUKOCYTESUR in the last 72 hours. ? ?Invalid input(s): APPERANCEUR  ? ? ?Imaging: ?No results found. ? ? ?Medications:  ? ?  ceFAZolin (ANCEF) IV    ? ? amLODipine  10 mg Oral Daily  ? calcitRIOL  2 mcg Intravenous Once per day on Mon Wed Fri  ? heparin  5,000 Units Subcutaneous Q8H  ? insulin aspart  0-15 Units Subcutaneous TID WC  ? insulin glargine-yfgn  12 Units Subcutaneous QHS  ? metoprolol succinate  50 mg Oral Daily  ? pantoprazole  40 mg Oral Daily  ? sevelamer carbonate  1,600 mg Oral TID PC  ? ?acetaminophen **OR** acetaminophen, diphenhydrAMINE, magnesium hydroxide, ondansetron **OR** ondansetron (ZOFRAN) IV,  pentafluoroprop-tetrafluoroeth, traZODone ? ?Assessment/ Plan:  ?[name] is a 46 y.o.  female with a history of hypertension, coronary artery disease, diabetes, peripheral vascular disease, end-stage renal disease and anemia now sent to the hospital because of inability to access her dialysis AV fistula on the left arm. ? ?[facility] [place]/MWF/Lt AVF ? ?End stage renal disease with malfunctioning HD access on dialysis. Due to multiple complaints of difficulty accessing AVF during outpatient treatments, patient will receive PermCath today.  Vascular surgery consulted yesterday.  Plan to place PermCath today with access evaluation scheduled at a later time.  If PermCath placement successful, patient cleared to discharge from renal stance to resume outpatient treatments at outpatient clinic tomorrow. ? ?2. Anemia of chronic kidney disease ?Normocytic ?Lab Results  ?Component Value Date  ? HGB 9.9 (L) 08/01/2021  ?Hemoglobin remains within acceptable range. ? ?3. Secondary Hyperparathyroidism:  ?Lab Results  ?Component Value Date  ? CALCIUM 7.7 (L) 08/01/2021  ? CAION 1.10 (L) 11/20/2020  ? PHOS 3.8 07/31/2021  ?  ?We will continue to monitor bone minerals during this admission. ?Continue sevelamer with meals ? ?4. Hypertension with chronic kidney disease. Currently prescribed amlodipine and metoprolol. BP 139/83 ? ? ? LOS: 2 ?[place] ?3/21/202311:24 AM ?  ?

## 2021-08-03 NOTE — Progress Notes (Signed)
Dr. Delana Meyer at bedside, speaking with pt. Re: perm cath pt. Pt. Verbalizes understanding of conversation.  ?

## 2021-08-03 NOTE — Plan of Care (Signed)
?  Problem: Education: ?Goal: Knowledge of General Education information will improve ?Description: Including pain rating scale, medication(s)/side effects and non-pharmacologic comfort measures ?Outcome: Progressing ?  ?Problem: Health Behavior/Discharge Planning: ?Goal: Ability to manage health-related needs will improve ?Outcome: Progressing ?  ?Problem: Clinical Measurements: ?Goal: Ability to maintain clinical measurements within normal limits will improve ?Outcome: Progressing ?Goal: Will remain free from infection ?Outcome: Progressing ?Goal: Diagnostic test results will improve ?Outcome: Progressing ?Goal: Respiratory complications will improve ?Outcome: Progressing ?Goal: Cardiovascular complication will be avoided ?Outcome: Progressing ?  ?Problem: Nutrition: ?Goal: Adequate nutrition will be maintained ?Outcome: Progressing ?  ?Problem: Activity: ?Goal: Risk for activity intolerance will decrease ?Outcome: Progressing ?  ?Problem: Coping: ?Goal: Level of anxiety will decrease ?Outcome: Progressing ?  ?Problem: Pain Managment: ?Goal: General experience of comfort will improve ?Outcome: Progressing ?  ?Problem: Elimination: ?Goal: Will not experience complications related to bowel motility ?Outcome: Progressing ?Goal: Will not experience complications related to urinary retention ?Outcome: Progressing ?  ?Problem: Safety: ?Goal: Ability to remain free from injury will improve ?Outcome: Progressing ?  ?Problem: Skin Integrity: ?Goal: Risk for impaired skin integrity will decrease ?Outcome: Progressing ?  ?Problem: Skin Integrity: ?Goal: Risk for impaired skin integrity will decrease ?Outcome: Progressing ?  ?Problem: Safety: ?Goal: Ability to remain free from injury will improve ?Outcome: Progressing ?  ?

## 2021-08-03 NOTE — Progress Notes (Signed)
? ? ? ?Progress Note  ? ? ?[name]  HYQ:[id number] DOB: 08-23-1975  DOA: 07/31/2021 ?PCP: System, Provider Not In  ? ? ? ? ?Brief Narrative:  ? ? ?Medical records reviewed and are as summarized below: ? ?[name] is a 46 y.o. female with medical history significant for ESRD on hemodialysis on Mondays, Wednesdays and Fridays, type 2 diabetes mellitus, hypertension, asthma, depression, GERD, who was brought to the hospital from the outpatient hemodialysis center because of problems with left arm AV fistula.  She was unable to have dialysis on Friday, 07/30/2021 because left arm AV fistula was not accessible. ? ?Dr. Lorenso Courier, vascular surgeon, was consulted and she was able to ?assist dialysis nurse at the inpatient hemodialysis unit to assess the left arm AV fistula.  Patient had successful hemodialysis in the hospital. ? ? ?Assessment/Plan:  ? ?Principal Problem: ?  Problem with vascular access ?Active Problems: ?  End-stage renal disease on hemodialysis ([place]) ?  Essential hypertension ?  Type 2 diabetes mellitus with diabetic chronic kidney disease ([place]) ?  GERD without esophagitis ? ? ? ?Body mass index is 50.57 kg/m?.  (Morbid obesity) ? ?ESRD, difficulty accessing left arm AV fistula: Plan for permacath placement today because of dysfunctional left AV fistula.  Follow-up with vascular surgeon.   ? ?Anxiety: He said last as needed for anxiety. ? ?Type II DM with hyperglycemia: Continue insulin glargine.  Use NovoLog as needed for hyperglycemia ? ?Hypertension: Continue antihypertensives ? ? ? ?Diet Order   ? ?       ?  Diet NPO time specified  Diet effective now       ?  ? ?  ?  ? ?  ? ? ? ? ? ? ?Consultants: ?Nephrologist ?Vascular surgeon ? ?Procedures: ?None ? ? ? ?Medications:  ? ? amLODipine  10 mg Oral Daily  ? calcitRIOL  2 mcg Intravenous Once per day on Mon Wed Fri  ? heparin  5,000 Units Subcutaneous Q8H  ? insulin aspart  0-15 Units Subcutaneous TID WC  ? insulin glargine-yfgn  12 Units  Subcutaneous QHS  ? metoprolol succinate  50 mg Oral Daily  ? pantoprazole  40 mg Oral Daily  ? sevelamer carbonate  1,600 mg Oral TID PC  ? ?Continuous Infusions: ?  ceFAZolin (ANCEF) IV    ? ? ? ?Anti-infectives (From admission, onward)  ? ? Start     Dose/Rate Route Frequency Ordered Stop  ? 08/03/21 0915  ceFAZolin (ANCEF) IVPB 2g/100 mL premix       ? 2 g ?200 mL/hr over 30 Minutes Intravenous On call to O.R. 08/03/21 0827 08/04/21 0559  ? ?  ? ? ? ? ? ? ? ? ? ?Family Communication/Anticipated D/C date and plan/Code Status  ? ?DVT prophylaxis: heparin injection 5,000 Units Start: 07/31/21 1445 ? ? ?  Code Status: Full Code ? ?Family Communication: None ?Disposition Plan: Plan to discharge home tomorrow ? ? ?Status is: Inpatient ?Remains inpatient appropriate because: Plan for hemodialysis tomorrow using left arm AV fistula prior to discharge ? ? ? ? ? ? ?Subjective:  ? ?Interval events noted.  No shortness of breath or chest pain.  She said Xanax helped to calm her down. ? ?Objective:  ? ? ?Vitals:  ? 08/02/21 1531 08/02/21 1924 08/03/21 0411 08/03/21 0719  ?BP: (!) 168/90 (!) 147/71 135/74 139/83  ?Pulse: 72 72 72 73  ?Resp: 16 16 20 18   ?Temp: 98 ?F (36.7 ?C) 98.2 ?F (36.8 ?C)  98.4 ?F (36.9 ?C) 98.1 ?F (36.7 ?C)  ?TempSrc: Oral Oral Oral   ?SpO2: 100% 98% 100% 100%  ?Weight:      ?Height:      ? ?No data found. ? ? ?Intake/Output Summary (Last 24 hours) at 08/03/2021 1302 ?Last data filed at 08/03/2021 0458 ?Gross per 24 hour  ?Intake 0 ml  ?Output 0 ml  ?Net 0 ml  ? ?Filed Weights  ? 07/31/21 1316 08/02/21 0829 08/02/21 1200  ?Weight: 122.5 kg 123.7 kg 121.4 kg  ? ? ?Exam: ? ?GEN: NAD ?SKIN: Warm and dry ?EYES: EOMI ?ENT: MMM ?CV: RRR ?PULM: CTA B ?ABD: soft, obese, NT, +BS ?CNS: AAO x 3, non focal ?EXT: No edema or tenderness ? ? ? ?  ? ? ?Data Reviewed:  ? ?I have personally reviewed following labs and imaging studies: ? ?Labs: ?Labs show the following:  ? ?Basic Metabolic Panel: ?Recent Labs  ?Lab  07/31/21 ?1332 07/31/21 ?1448 08/01/21 ?1497  ?NA 133* 134* 136  ?K 5.3* 3.7 4.5  ?CL 98 97* 95*  ?CO2 20* 25 29  ?GLUCOSE 287* 171* 172*  ?BUN 62* 35* 35*  ?CREATININE 11.55* 6.74* 7.67*  ?CALCIUM 7.4* 7.6* 7.7*  ?PHOS  --  3.8  --   ? ?GFR ?Estimated Creatinine Clearance: 11.2 mL/min (A) (by C-G formula based on SCr of 7.67 mg/dL (H)). ?Liver Function Tests: ?Recent Labs  ?Lab 07/31/21 ?1448  ?ALBUMIN 3.5  ? ?No results for input(s): LIPASE, AMYLASE in the last 168 hours. ?No results for input(s): AMMONIA in the last 168 hours. ?Coagulation profile ?No results for input(s): INR, PROTIME in the last 168 hours. ? ?CBC: ?Recent Labs  ?Lab 07/31/21 ?1332 07/31/21 ?1448 08/01/21 ?0263  ?WBC 11.0* 9.8 9.0  ?HGB 9.4* 9.7* 9.9*  ?HCT 28.8* 28.5* 29.4*  ?MCV 94.7 91.9 93.0  ?PLT 280 281 269  ? ?Cardiac Enzymes: ?No results for input(s): CKTOTAL, CKMB, CKMBINDEX, TROPONINI in the last 168 hours. ?BNP (last 3 results) ?No results for input(s): PROBNP in the last 8760 hours. ?CBG: ?Recent Labs  ?Lab 08/02/21 ?2108 08/02/21 ?2335 08/03/21 ?0720 08/03/21 ?7858 08/03/21 ?1136  ?GLUCAP 303* 297* 158* 149* 127*  ? ?D-Dimer: ?No results for input(s): DDIMER in the last 72 hours. ?Hgb A1c: ?No results for input(s): HGBA1C in the last 72 hours. ?Lipid Profile: ?No results for input(s): CHOL, HDL, LDLCALC, TRIG, CHOLHDL, LDLDIRECT in the last 72 hours. ?Thyroid function studies: ?No results for input(s): TSH, T4TOTAL, T3FREE, THYROIDAB in the last 72 hours. ? ?Invalid input(s): FREET3 ?Anemia work up: ?No results for input(s): VITAMINB12, FOLATE, FERRITIN, TIBC, IRON, RETICCTPCT in the last 72 hours. ?Sepsis Labs: ?Recent Labs  ?Lab 07/31/21 ?1332 07/31/21 ?1448 08/01/21 ?8502  ?WBC 11.0* 9.8 9.0  ? ? ?Microbiology ?No results found for this or any previous visit (from the past 240 hour(s)). ? ?Procedures and diagnostic studies: ? ?No results found. ? ? ? ? ? ? ? ? ? ? ? ? LOS: 2 days  ? ?[name]  ?[facility]  ? ?Pager  on [email or web address]. If 7PM-7AM, please contact night-coverage at [email or web address] ? ? ? ? ?08/03/2021, 1:02 PM  ? ? ? ? ? ? ? ? ? ?

## 2021-08-03 NOTE — Progress Notes (Signed)
Consent form partially completed as orders not entered for perm cath placement, nurse has not had patient sign consent. Will relay to day shift nurse to complete orders once official order placed. This nurse was able to partially prepare form provider notes.  ?

## 2021-08-04 ENCOUNTER — Encounter: Payer: Self-pay | Admitting: Vascular Surgery

## 2021-08-04 LAB — GLUCOSE, CAPILLARY
Glucose-Capillary: 157 mg/dL — ABNORMAL HIGH (ref 70–99)
Glucose-Capillary: 238 mg/dL — ABNORMAL HIGH (ref 70–99)
Glucose-Capillary: 178 mg/dL — ABNORMAL HIGH (ref 70–99)
Glucose-Capillary: 168 mg/dL — ABNORMAL HIGH (ref 70–99)

## 2021-08-04 LAB — CBC
HCT: 30 % — ABNORMAL LOW (ref 36.0–46.0)
Hemoglobin: 9.9 g/dL — ABNORMAL LOW (ref 12.0–15.0)
MCH: 30.6 pg (ref 26.0–34.0)
MCHC: 33 g/dL (ref 30.0–36.0)
MCV: 92.6 fL (ref 80.0–100.0)
Platelets: 302 10*3/uL (ref 150–400)
RBC: 3.24 MIL/uL — ABNORMAL LOW (ref 3.87–5.11)
RDW: 12.7 % (ref 11.5–15.5)
WBC: 7.4 10*3/uL (ref 4.0–10.5)
nRBC: 0 % (ref 0.0–0.2)

## 2021-08-04 LAB — BASIC METABOLIC PANEL
Anion gap: 12 (ref 5–15)
BUN: 47 mg/dL — ABNORMAL HIGH (ref 6–20)
CO2: 26 mmol/L (ref 22–32)
Calcium: 8 mg/dL — ABNORMAL LOW (ref 8.9–10.3)
Chloride: 94 mmol/L — ABNORMAL LOW (ref 98–111)
Creatinine, Ser: 7.69 mg/dL — ABNORMAL HIGH (ref 0.44–1.00)
GFR, Estimated: 6 mL/min — ABNORMAL LOW (ref >60)
Glucose, Bld: 201 mg/dL — ABNORMAL HIGH (ref 70–99)
Potassium: 4.3 mmol/L (ref 3.5–5.1)
Sodium: 132 mmol/L — ABNORMAL LOW (ref 135–145)

## 2021-08-04 MED ORDER — HEPARIN SODIUM (PORCINE) 1000 UNIT/ML IJ SOLN
INTRAMUSCULAR | Status: AC
Start: 2021-08-04 — End: 2021-08-04
  Administered 2021-08-04: 2000 [IU] via INTRAVENOUS
  Filled 2021-08-04: qty 10

## 2021-08-04 MED ORDER — CHLORHEXIDINE GLUCONATE CLOTH 2 % EX PADS
6.0000 | MEDICATED_PAD | Freq: Every day | CUTANEOUS | Status: DC
  Administered 2021-08-04 – 2021-08-05 (×2): 6 via TOPICAL

## 2021-08-04 MED ORDER — OXYCODONE-ACETAMINOPHEN 5-325 MG PO TABS
1.0000 | ORAL_TABLET | Freq: Four times a day (QID) | ORAL | Status: DC | PRN
  Administered 2021-08-04 – 2021-08-05 (×5): 1 via ORAL
  Filled 2021-08-04 (×5): qty 1

## 2021-08-04 MED ORDER — ALPRAZOLAM 0.25 MG PO TABS
0.2500 mg | ORAL_TABLET | Freq: Two times a day (BID) | ORAL | Status: DC | PRN
  Administered 2021-08-04: 0.25 mg via ORAL
  Filled 2021-08-04: qty 1

## 2021-08-04 MED ORDER — HEPARIN SODIUM (PORCINE) 1000 UNIT/ML IJ SOLN: 2000.0000 [IU] | Freq: Once | INTRAMUSCULAR | Status: AC

## 2021-08-04 MED ORDER — MORPHINE SULFATE (PF) 2 MG/ML IV SOLN: 1.0000 mg | INTRAVENOUS | Status: DC | PRN

## 2021-08-04 NOTE — Plan of Care (Signed)
?  Problem: Education: ?Goal: Knowledge of General Education information will improve ?Description: Including pain rating scale, medication(s)/side effects and non-pharmacologic comfort measures ?Outcome: Progressing ?  ?Problem: Health Behavior/Discharge Planning: ?Goal: Ability to manage health-related needs will improve ?Outcome: Progressing ?  ?Problem: Clinical Measurements: ?Goal: Ability to maintain clinical measurements within normal limits will improve ?Outcome: Progressing ?Goal: Will remain free from infection ?Outcome: Progressing ?Goal: Diagnostic test results will improve ?Outcome: Progressing ?Goal: Respiratory complications will improve ?Outcome: Progressing ?Goal: Cardiovascular complication will be avoided ?Outcome: Progressing ?  ?Problem: Activity: ?Goal: Risk for activity intolerance will decrease ?Outcome: Progressing ?  ?Problem: Nutrition: ?Goal: Adequate nutrition will be maintained ?Outcome: Progressing ?  ?Problem: Coping: ?Goal: Level of anxiety will decrease ?Outcome: Progressing ?  ?Problem: Elimination: ?Goal: Will not experience complications related to bowel motility ?Outcome: Progressing ?Goal: Will not experience complications related to urinary retention ?Outcome: Progressing ?  ?Problem: Pain Managment: ?Goal: General experience of comfort will improve ?Outcome: Progressing ?  ?Problem: Safety: ?Goal: Ability to remain free from injury will improve ?Outcome: Progressing ?  ?Problem: Safety: ?Goal: Ability to remain free from injury will improve ?Outcome: Progressing ?  ?Problem: Pain Managment: ?Goal: General experience of comfort will improve ?Outcome: Progressing ?  ?Problem: Skin Integrity: ?Goal: Risk for impaired skin integrity will decrease ?Outcome: Progressing ?  ?

## 2021-08-04 NOTE — Progress Notes (Signed)
?PROGRESS NOTE ? ? ? ?[name]  JGO:[id number] DOB: 02/04/1976 DOA: 07/31/2021 ?PCP: System, Provider Not In  ? ?Assessment & Plan: ?  ?Principal Problem: ?  Problem with vascular access ?Active Problems: ?  End-stage renal disease on hemodialysis ([place]) ?  Essential hypertension ?  Type 2 diabetes mellitus with diabetic chronic kidney disease ([place]) ?  GERD without esophagitis ? ? ?ESRD: w/ difficulty accessing left arm AV fistula. S/p of right tunneled HD cath on 08/03/21 as per vascular surg. Continue on HD MWF.  ? ?Anxiety: severity unknown. Xanax prn  ? ?DM2: likely poorly controlled. Continue on glargine, SSI w/ accuchecks  ? ?HTN: continue on metoprolol ? ?Morbid obesity: BMI 50.5. Complicates overall care & prognosis  ? ? ?DVT prophylaxis: heparin ?Code Status:  full  ?Family Communication:  ?Disposition Plan:  likely d/c back home  ? ?Level of care: Med-Surg ? ?Status is: Inpatient ?Remains inpatient appropriate because: severity of illness ? ? ? ?Consultants:  ?Nephro  ? ?Procedures:  ? ?Antimicrobials:  ? ? ?Subjective: ?Pt c/o pain in the area of tunneled HD cath placement ? ?Objective: ?Vitals:  ? 08/04/21 1100 08/04/21 1115 08/04/21 1130 08/04/21 1145  ?BP: 134/75 140/70 122/63 (!) 148/89  ?Pulse: 65 68 74 70  ?Resp:  20 (!) 21 15  ?Temp:      ?TempSrc:      ?SpO2: 97% 98% 99% 100%  ?Weight:      ?Height:      ? ? ?Intake/Output Summary (Last 24 hours) at 08/04/2021 1149 ?Last data filed at 08/03/2021 1945 ?Gross per 24 hour  ?Intake 0 ml  ?Output 0 ml  ?Net 0 ml  ? ?Filed Weights  ? 08/02/21 0829 08/02/21 1200 08/04/21 0910  ?Weight: 123.7 kg 121.4 kg 122.5 kg  ? ? ?Examination: ? ?General exam: Appears calm and comfortable. Morbidly obese ?Respiratory system: Clear to auscultation. Respiratory effort normal. ?Cardiovascular system: S1 & S2 +. No rubs, gallops or clicks. ?Gastrointestinal system: Abdomen is obese, soft and nontender. Normal bowel sounds heard. ?Central nervous system: Alert and  oriented. Moves all extremities  ?Psychiatry: Judgement and insight appear normal. Flat mood and affect.  ? ? ? ?Data Reviewed: I have personally reviewed following labs and imaging studies ? ?CBC: ?Recent Labs  ?Lab 07/31/21 ?1332 07/31/21 ?1448 08/01/21 ?5597 08/04/21 ?1019  ?WBC 11.0* 9.8 9.0 7.4  ?HGB 9.4* 9.7* 9.9* 9.9*  ?HCT 28.8* 28.5* 29.4* 30.0*  ?MCV 94.7 91.9 93.0 92.6  ?PLT 280 281 269 302  ? ?Basic Metabolic Panel: ?Recent Labs  ?Lab 07/31/21 ?1332 07/31/21 ?1448 08/01/21 ?4163 08/04/21 ?1019  ?NA 133* 134* 136 132*  ?K 5.3* 3.7 4.5 4.3  ?CL 98 97* 95* 94*  ?CO2 20* 25 29 26   ?GLUCOSE 287* 171* 172* 201*  ?BUN 62* 35* 35* 47*  ?CREATININE 11.55* 6.74* 7.67* 7.69*  ?CALCIUM 7.4* 7.6* 7.7* 8.0*  ?PHOS  --  3.8  --   --   ? ?GFR: ?Estimated Creatinine Clearance: 11.2 mL/min (A) (by C-G formula based on SCr of 7.69 mg/dL (H)). ?Liver Function Tests: ?Recent Labs  ?Lab 07/31/21 ?1448  ?ALBUMIN 3.5  ? ?No results for input(s): LIPASE, AMYLASE in the last 168 hours. ?No results for input(s): AMMONIA in the last 168 hours. ?Coagulation Profile: ?No results for input(s): INR, PROTIME in the last 168 hours. ?Cardiac Enzymes: ?No results for input(s): CKTOTAL, CKMB, CKMBINDEX, TROPONINI in the last 168 hours. ?BNP (last 3 results) ?No results for input(s): PROBNP in the  last 8760 hours. ?HbA1C: ?No results for input(s): HGBA1C in the last 72 hours. ?CBG: ?Recent Labs  ?Lab 08/03/21 ?1136 08/03/21 ?1512 08/03/21 ?1710 08/03/21 ?2033 08/04/21 ?0755  ?GLUCAP 127* 93 153* 337* 157*  ? ?Lipid Profile: ?No results for input(s): CHOL, HDL, LDLCALC, TRIG, CHOLHDL, LDLDIRECT in the last 72 hours. ?Thyroid Function Tests: ?No results for input(s): TSH, T4TOTAL, FREET4, T3FREE, THYROIDAB in the last 72 hours. ?Anemia Panel: ?No results for input(s): VITAMINB12, FOLATE, FERRITIN, TIBC, IRON, RETICCTPCT in the last 72 hours. ?Sepsis Labs: ?No results for input(s): PROCALCITON, LATICACIDVEN in the last 168 hours. ? ?No results  found for this or any previous visit (from the past 240 hour(s)).  ? ? ? ? ? ?Radiology Studies: ?PERIPHERAL VASCULAR CATHETERIZATION ? ?Result Date: 08/03/2021 ?See surgical note for result.  ? ? ? ? ? ?Scheduled Meds: ? amLODipine  10 mg Oral Daily  ? calcitRIOL  2 mcg Intravenous Once per day on Mon Wed Fri  ? Chlorhexidine Gluconate Cloth  6 each Topical Daily  ? heparin  5,000 Units Subcutaneous Q8H  ? insulin aspart  0-15 Units Subcutaneous TID WC  ? insulin glargine-yfgn  12 Units Subcutaneous QHS  ? metoprolol succinate  50 mg Oral Daily  ? pantoprazole  40 mg Oral Daily  ? sevelamer carbonate  1,600 mg Oral TID PC  ? ?Continuous Infusions: ? ? LOS: 3 days  ? ? ?Time spent: 30 mins  ? ? ? ?Wyvonnia Dusky, MD ?[facility] ?Pager [phone] ? ?If 7PM-7AM, please contact night-coverage ?08/04/2021, 11:49 AM  ? ?

## 2021-08-04 NOTE — Progress Notes (Signed)
?[facility]  ?ROUNDING NOTE  ? ?Subjective:  ? ?Patient seen and evaluated during hemodialysis ?  ?HEMODIALYSIS FLOWSHEET: ? ?Blood Flow Rate (mL/min): 300 mL/min ?Arterial Pressure (mmHg): -240 mmHg ?Venous Pressure (mmHg): 100 mmHg ?Transmembrane Pressure (mmHg): 70 mmHg ?Ultrafiltration Rate (mL/min): 840 mL/min ?Dialysate Flow Rate (mL/min): 600 ml/min ?Conductivity: Machine : 13.7 ?Conductivity: Machine : 13.7 ?Dialysis Fluid Bolus: Normal Saline ?Bolus Amount (mL): 100 mL ? ?No complaints at this time ? ?Objective:  ?Vital signs in last 24 hours:  ?Temp:  [97.9 ?F (36.6 ?C)-98.3 ?F (36.8 ?C)] 98.3 ?F (36.8 ?C) (03/22 0910) ?Pulse Rate:  [0-91] 65 (03/22 1100) ?Resp:  [10-24] 18 (03/22 1045) ?BP: (113-160)/(68-103) 134/75 (03/22 1100) ?SpO2:  [95 %-100 %] 97 % (03/22 1100) ?Weight:  [122.5 kg] 122.5 kg (03/22 0910) ? ?Weight change:  ?Filed Weights  ? 08/02/21 0829 08/02/21 1200 08/04/21 0910  ?Weight: 123.7 kg 121.4 kg 122.5 kg  ? ? ?Intake/Output: ?No intake/output data recorded. ?  ?Intake/Output this shift: ? No intake/output data recorded. ? ?Physical Exam: ?General: NAD, resting in bed  ?Head: Normocephalic, atraumatic. Moist oral mucosal membranes  ?Eyes: Anicteric  ?Lungs:  Clear to auscultation, normal effort  ?Heart: Regular rate and rhythm  ?Abdomen:  Soft, nontender  ?Extremities:  no peripheral edema.  ?Neurologic: Nonfocal, moving all four extremities  ?Skin: No lesions  ?Access: Lt AVF, right PermCath placed on 08/03/2021  ? ? ?Basic Metabolic Panel: ?Recent Labs  ?Lab 07/31/21 ?1332 07/31/21 ?1448 08/01/21 ?4709 08/04/21 ?1019  ?NA 133* 134* 136 132*  ?K 5.3* 3.7 4.5 4.3  ?CL 98 97* 95* 94*  ?CO2 20* 25 29 26   ?GLUCOSE 287* 171* 172* 201*  ?BUN 62* 35* 35* 47*  ?CREATININE 11.55* 6.74* 7.67* 7.69*  ?CALCIUM 7.4* 7.6* 7.7* 8.0*  ?PHOS  --  3.8  --   --   ? ? ? ?Liver Function Tests: ?Recent Labs  ?Lab 07/31/21 ?1448  ?ALBUMIN 3.5  ? ? ?No results for input(s): LIPASE, AMYLASE in the  last 168 hours. ?No results for input(s): AMMONIA in the last 168 hours. ? ?CBC: ?Recent Labs  ?Lab 07/31/21 ?1332 07/31/21 ?1448 08/01/21 ?6283 08/04/21 ?1019  ?WBC 11.0* 9.8 9.0 7.4  ?HGB 9.4* 9.7* 9.9* 9.9*  ?HCT 28.8* 28.5* 29.4* 30.0*  ?MCV 94.7 91.9 93.0 92.6  ?PLT 280 281 269 302  ? ? ? ?Cardiac Enzymes: ?No results for input(s): CKTOTAL, CKMB, CKMBINDEX, TROPONINI in the last 168 hours. ? ?BNP: ?Invalid input(s): POCBNP ? ?CBG: ?Recent Labs  ?Lab 08/03/21 ?1136 08/03/21 ?1512 08/03/21 ?1710 08/03/21 ?2033 08/04/21 ?0755  ?GLUCAP 127* 93 153* 337* 157*  ? ? ? ?Microbiology: ?Results for orders placed or performed in visit on 03/25/21  ?Microscopic Examination     Status: Abnormal  ? Collection Time: 03/25/21  3:30 PM  ? Urine  ?Result Value Ref Range Status  ? WBC, UA 6-10 (A) 0 - 5 /hpf Final  ? RBC None seen 0 - 2 /hpf Final  ? Epithelial Cells (non renal) >10 (A) 0 - 10 /hpf Final  ? Casts Present (A) None seen /lpf Final  ? Cast Type Granular casts (A) N/A Final  ? Bacteria, UA Many (A) None seen/Few Final  ? Yeast, UA Present (A) None seen Final  ?Urine Culture, Reflex     Status: None  ? Collection Time: 03/25/21  3:30 PM  ? Urine  ?Result Value Ref Range Status  ? Urine Culture, Routine Final report  Final  ?  Organism ID, Bacteria Comment  Final  ?  Comment: Greater than 2 organisms recovered, none predominant. Please submit ?another sample if clinically indicated. ?50,000-100,000 colony forming units per mL ?  ? ? ?Coagulation Studies: ?No results for input(s): LABPROT, INR in the last 72 hours. ? ?Urinalysis: ?No results for input(s): COLORURINE, LABSPEC, [place], GLUCOSEU, HGBUR, BILIRUBINUR, KETONESUR, PROTEINUR, UROBILINOGEN, NITRITE, LEUKOCYTESUR in the last 72 hours. ? ?Invalid input(s): APPERANCEUR  ? ? ?Imaging: ?PERIPHERAL VASCULAR CATHETERIZATION ? ?Result Date: 08/03/2021 ?See surgical note for result.  ? ? ?Medications:  ? ? ? ? amLODipine  10 mg Oral Daily  ? calcitRIOL  2 mcg Intravenous  Once per day on Mon Wed Fri  ? Chlorhexidine Gluconate Cloth  6 each Topical Daily  ? heparin  5,000 Units Subcutaneous Q8H  ? insulin aspart  0-15 Units Subcutaneous TID WC  ? insulin glargine-yfgn  12 Units Subcutaneous QHS  ? metoprolol succinate  50 mg Oral Daily  ? pantoprazole  40 mg Oral Daily  ? sevelamer carbonate  1,600 mg Oral TID PC  ? ?acetaminophen **OR** acetaminophen, ALPRAZolam, diphenhydrAMINE, morphine injection, ondansetron **OR** ondansetron (ZOFRAN) IV, oxyCODONE-acetaminophen, pentafluoroprop-tetrafluoroeth, senna, traZODone ? ?Assessment/ Plan:  ?Ms. Elin Fenley is a 46 y.o.  female with a history of hypertension, coronary artery disease, diabetes, peripheral vascular disease, end-stage renal disease and anemia now sent to the hospital because of inability to access her dialysis AV fistula on the left arm. ? ?[facility] [place]/MWF/Lt AVF ? ?End stage renal disease with malfunctioning HD access on dialysis.  ? Appreciate vascular surgery placing right PermCath for HD access.  Patient will receive appointment at later date to have fistula evaluated.  Patient will receive dialysis today utilizing permacath.  If successful treatment, patient cleared to discharge from renal stance.  Blood flow reduced to 300, not unexpected due to recent placement and inflammation. ? ?2. Anemia of chronic kidney disease ?Normocytic ?Lab Results  ?Component Value Date  ? HGB 9.9 (L) 08/04/2021  ?Hemoglobin remains at goal ? ?3. Secondary Hyperparathyroidism:  ?Lab Results  ?Component Value Date  ? CALCIUM 8.0 (L) 08/04/2021  ? CAION 1.10 (L) 11/20/2020  ? PHOS 3.8 07/31/2021  ?  ?Calcium and phosphorus within acceptable range.  We will continue to monitor. ?Continue sevelamer with meals ? ?4. Hypertension with chronic kidney disease. Currently prescribed amlodipine and metoprolol. BP 134/75 during dialysis ? ? ? LOS: 3 ?[place] ?3/22/202311:02 AM ?  ?

## 2021-08-04 NOTE — Progress Notes (Signed)
Hemodialysis Post Treatment Note: ? ?Tx date:08/04/21 ?Tx time: 2 hours and 56 minutes ?Access: Right CVC ?UF Removed: 2L ? ?Note:  ?09:19 HD started ? ?10:15 pt was unable to tolerate BFR 400, BFR then lowered to 300, NP shantelle was made aware. ? ?10:30 venous pressure going high, risk for clotting , will do flushes 100cc every 30 minustes, NP shantelle aware ? ?11:00 lines reversed still can only tolerate BFR of 300 ? ?11:19 heparin 2000 units given IV as per NP shantelle ? ?11:30 100 cc NSS flush every 30 minutes was discontinued because venous pressure improved. ? ?12:34 HD terminated with 3 minutes remaining time d/t visible clot on arterial chamber. NP shantelle was made aware. ? ?Patient asymptomatic. no complaints.  ? ? ? ? ?  ?

## 2021-08-05 LAB — BASIC METABOLIC PANEL
Anion gap: 11 (ref 5–15)
BUN: 52 mg/dL — ABNORMAL HIGH (ref 6–20)
CO2: 26 mmol/L (ref 22–32)
Calcium: 8.3 mg/dL — ABNORMAL LOW (ref 8.9–10.3)
Chloride: 96 mmol/L — ABNORMAL LOW (ref 98–111)
Creatinine, Ser: 8.51 mg/dL — ABNORMAL HIGH (ref 0.44–1.00)
GFR, Estimated: 5 mL/min — ABNORMAL LOW (ref >60)
Glucose, Bld: 184 mg/dL — ABNORMAL HIGH (ref 70–99)
Potassium: 4.8 mmol/L (ref 3.5–5.1)
Sodium: 133 mmol/L — ABNORMAL LOW (ref 135–145)

## 2021-08-05 LAB — CBC
HCT: 28.8 % — ABNORMAL LOW (ref 36.0–46.0)
Hemoglobin: 9.6 g/dL — ABNORMAL LOW (ref 12.0–15.0)
MCH: 31 pg (ref 26.0–34.0)
MCHC: 33.3 g/dL (ref 30.0–36.0)
MCV: 92.9 fL (ref 80.0–100.0)
Platelets: 273 10*3/uL (ref 150–400)
RBC: 3.1 MIL/uL — ABNORMAL LOW (ref 3.87–5.11)
RDW: 12.7 % (ref 11.5–15.5)
WBC: 7.7 10*3/uL (ref 4.0–10.5)
nRBC: 0 % (ref 0.0–0.2)

## 2021-08-05 LAB — GLUCOSE, CAPILLARY
Glucose-Capillary: 201 mg/dL — ABNORMAL HIGH (ref 70–99)
Glucose-Capillary: 148 mg/dL — ABNORMAL HIGH (ref 70–99)

## 2021-08-05 NOTE — Progress Notes (Addendum)
?[name]  ?ROUNDING NOTE  ? ?Subjective:  ? ?Patient resting quietly ?States she feels better today ?Agreeable to discharge ?Tolerating meals ? ?Objective:  ?Vital signs in last 24 hours:  ?Temp:  [98 ?F (36.7 ?C)-98.8 ?F (37.1 ?C)] 98.4 ?F (36.9 ?C) (03/23 0800) ?Pulse Rate:  [65-71] 70 (03/23 0800) ?Resp:  [15-27] 19 (03/23 0800) ?BP: (137-161)/(79-92) 147/85 (03/23 0800) ?SpO2:  [96 %-100 %] 98 % (03/23 0800) ?Weight:  [121 kg] 121 kg (03/22 1256) ? ?Weight change:  ?Filed Weights  ? 08/02/21 1200 08/04/21 0910 08/04/21 1256  ?Weight: 121.4 kg 122.5 kg 121 kg  ? ? ?Intake/Output: ?I/O last 3 completed shifts: ?In: 240 [P.O.:240] ?Out: 2100 [Urine:100; Other:2000] ?  ?Intake/Output this shift: ? Total I/O ?In: 238 [P.O.:238] ?Out: -  ? ?Physical Exam: ?General: NAD, resting in bed  ?Head: Normocephalic, atraumatic. Moist oral mucosal membranes  ?Eyes: Anicteric  ?Lungs:  Clear to auscultation, normal effort  ?Heart: Regular rate and rhythm  ?Abdomen:  Soft, nontender  ?Extremities:  no peripheral edema.  ?Neurologic: Nonfocal, moving all four extremities  ?Skin: No lesions  ?Access: Lt AVF, right PermCath placed on 08/03/2021  ? ? ?Basic Metabolic Panel: ?Recent Labs  ?Lab 07/31/21 ?1332 07/31/21 ?1448 08/01/21 ?6314 08/04/21 ?1019 08/05/21 ?9702  ?NA 133* 134* 136 132* 133*  ?K 5.3* 3.7 4.5 4.3 4.8  ?CL 98 97* 95* 94* 96*  ?CO2 20* 25 29 26 26   ?GLUCOSE 287* 171* 172* 201* 184*  ?BUN 62* 35* 35* 47* 52*  ?CREATININE 11.55* 6.74* 7.67* 7.69* 8.51*  ?CALCIUM 7.4* 7.6* 7.7* 8.0* 8.3*  ?PHOS  --  3.8  --   --   --   ? ? ? ?Liver Function Tests: ?Recent Labs  ?Lab 07/31/21 ?1448  ?ALBUMIN 3.5  ? ? ?No results for input(s): LIPASE, AMYLASE in the last 168 hours. ?No results for input(s): AMMONIA in the last 168 hours. ? ?CBC: ?Recent Labs  ?Lab 07/31/21 ?1332 07/31/21 ?1448 08/01/21 ?6378 08/04/21 ?1019 08/05/21 ?5885  ?WBC 11.0* 9.8 9.0 7.4 7.7  ?HGB 9.4* 9.7* 9.9* 9.9* 9.6*  ?HCT 28.8* 28.5* 29.4*  30.0* 28.8*  ?MCV 94.7 91.9 93.0 92.6 92.9  ?PLT 280 281 269 302 273  ? ? ? ?Cardiac Enzymes: ?No results for input(s): CKTOTAL, CKMB, CKMBINDEX, TROPONINI in the last 168 hours. ? ?BNP: ?Invalid input(s): POCBNP ? ?CBG: ?Recent Labs  ?Lab 08/04/21 ?1317 08/04/21 ?1630 08/04/21 ?2117 08/05/21 ?0277 08/05/21 ?1142  ?GLUCAP 238* 178* 168* 201* 148*  ? ? ? ?Microbiology: ?Results for orders placed or performed in visit on 03/25/21  ?Microscopic Examination     Status: Abnormal  ? Collection Time: 03/25/21  3:30 PM  ? Urine  ?Result Value Ref Range Status  ? WBC, UA 6-10 (A) 0 - 5 /hpf Final  ? RBC None seen 0 - 2 /hpf Final  ? Epithelial Cells (non renal) >10 (A) 0 - 10 /hpf Final  ? Casts Present (A) None seen /lpf Final  ? Cast Type Granular casts (A) N/A Final  ? Bacteria, UA Many (A) None seen/Few Final  ? Yeast, UA Present (A) None seen Final  ?Urine Culture, Reflex     Status: None  ? Collection Time: 03/25/21  3:30 PM  ? Urine  ?Result Value Ref Range Status  ? Urine Culture, Routine Final report  Final  ? Organism ID, Bacteria Comment  Final  ?  Comment: Greater than 2 organisms recovered, none predominant. Please submit ?another sample  if clinically indicated. ?50,000-100,000 colony forming units per mL ?  ? ? ?Coagulation Studies: ?No results for input(s): LABPROT, INR in the last 72 hours. ? ?Urinalysis: ?No results for input(s): COLORURINE, LABSPEC, [place], GLUCOSEU, HGBUR, BILIRUBINUR, KETONESUR, PROTEINUR, UROBILINOGEN, NITRITE, LEUKOCYTESUR in the last 72 hours. ? ?Invalid input(s): APPERANCEUR  ? ? ?Imaging: ?PERIPHERAL VASCULAR CATHETERIZATION ? ?Result Date: 08/03/2021 ?See surgical note for result.  ? ? ?Medications:  ? ? ? ? amLODipine  10 mg Oral Daily  ? calcitRIOL  2 mcg Intravenous Once per day on Mon Wed Fri  ? Chlorhexidine Gluconate Cloth  6 each Topical Daily  ? heparin  5,000 Units Subcutaneous Q8H  ? insulin aspart  0-15 Units Subcutaneous TID WC  ? insulin glargine-yfgn  12 Units  Subcutaneous QHS  ? metoprolol succinate  50 mg Oral Daily  ? pantoprazole  40 mg Oral Daily  ? sevelamer carbonate  1,600 mg Oral TID PC  ? ?acetaminophen **OR** acetaminophen, ALPRAZolam, diphenhydrAMINE, morphine injection, ondansetron **OR** ondansetron (ZOFRAN) IV, oxyCODONE-acetaminophen, pentafluoroprop-tetrafluoroeth, senna, traZODone ? ?Assessment/ Plan:  ?Ms. Latoya Maulding is a 46 y.o.  female with a history of hypertension, coronary artery disease, diabetes, peripheral vascular disease, end-stage renal disease and anemia now sent to the hospital because of inability to access her dialysis AV fistula on the left arm. ? ?[facility] [place]/MWF/Lt AVF ? ?End stage renal disease with malfunctioning HD access on dialysis.  ? Appreciate vascular surgery placing right PermCath for HD access.  Patient will receive appointment at later date to have fistula evaluated. ? Dialysis received yesterday via permcath. Successful treatment performed with BFR 300, increased venous pressures suspected due to inflammation. UF 2L achieved. Next treatment scheduled for Friday.  ? ?2. Anemia of chronic Hines disease ?Normocytic ?Lab Results  ?Component Value Date  ? HGB 9.6 (L) 08/05/2021  ?Hemoglobin within acceptable range ? ?3. Secondary Hyperparathyroidism:  ?Lab Results  ?Component Value Date  ? CALCIUM 8.3 (L) 08/05/2021  ? CAION 1.10 (L) 11/20/2020  ? PHOS 3.8 07/31/2021  ?  ?Continue sevelamer with meals ? ?4. Hypertension with chronic Hines disease. Currently prescribed amlodipine and metoprolol. BP 147/85 ? ? ? LOS: 4 ?[place] ?3/23/202312:40 PM ?  ?
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
## 2021-08-05 NOTE — Discharge Summary (Signed)
Physician Discharge Summary  ?[name] LAG:[id number] DOB: [birth year] DOA: 07/31/2021 ? ?PCP: System, Provider Not In ? ?Admit date: 07/31/2021 ?Discharge date: 08/05/2021 ? ?Admitted From: home  ?Disposition:  home ? ?Recommendations for Outpatient Follow-up:  ?Follow up with PCP in 1-2 weeks ?F/u w/ nephro in 1-2 weeks ? ?Home Health: no ?Equipment/Devices: right tunneled HF cath  ? ?Discharge Condition: stable  ?CODE STATUS: full  ?Diet recommendation: Heart Healthy / Carb Modified / renal  ? ?Brief/Interim Summary: ?HPI was taken from Dr. Sidney Ace: ?[name] is a 46 y.o. Hispanic female with medical history significant for GERD, type 2 diabetes mellitus, depression, hypertension and asthma, as well as stage renal disease on hemodialysis who presented to the emergency room with acute onset of problematic vascular access with her AV fistula.  She has received hemodialysis on Monday and Wednesday but today despite going to another HD center, it was not accessible.  She had a revision of her fistula about a month ago by vascular surgery.  She denies any nausea or vomiting or abdominal pain. She experienced chills on Thursday and took Tylenol but denied any fever or chills the last couple of days.  She is still making small amount of urine and denies any dysuria, urinary frequency or urgency or flank pain.  No chest pain or dyspnea or cough or wheezing. ?  ?ED Course: Upon presentation to the ER, BP was 159/85 with otherwise normal vital signs.  Labs revealed a potassium of 5.3 with a sodium 133 and chloride of 98 CO2 of 20 and blood glucose of 287 with a BUN of 16 creatinine 11.55 with a calcium of 7.4.  CBC showed WBC of 11 hemoglobin of 9.4 hematocrit 28.8. ?  ?Imaging: Chest x-ray showed cardiomegaly with mild interstitial pulmonary edema likely slightly improved from 01/03/2021.  There was interval removal of right internal jugular central venous dialysis catheter. ? ?Dr. Mel Almond potty was notified about the  patient and recommended having vascular surgery consult.  Dr. Lorenso Courier for the patient vascular access for hemodialysis today.  She will be admitted to an observation medical telemetry bed for further evaluation and management ? ? ?As per Dr. Jimmye Norman 3/22-3/23/23: Pt is s/p right tunneled HD cath on 08/03/21 as per vascular surg. Pt was able to have HD after this cath was placed. Pt will continue on her MWF schedule. For more information, please see previous progress/consult notes.  ? ?Discharge Diagnoses:  ?Principal Problem: ?  Problem with vascular access ?Active Problems: ?  End-stage renal disease on hemodialysis ([place]) ?  Essential hypertension ?  Type 2 diabetes mellitus with diabetic chronic kidney disease ([place]) ?  GERD without esophagitis ? ?ESRD: w/ difficulty accessing left arm AV fistula. S/p of right tunneled HD cath on 08/03/21 as per vascular surg. Continue on HD MWF.  ?  ?Anxiety: severity unknown. Xanax prn  ?  ?DM2: likely poorly controlled. Continue on glargine, SSI w/ accuchecks  ? ?HTN: continue on metoprolol ?  ?Morbid obesity: BMI 50.5. Complicates overall care & prognosis  ? ?Discharge Instructions ? ?Discharge Instructions   ? ? Diet - low sodium heart healthy   Complete by: As directed ?  ? Diet Carb Modified   Complete by: As directed ?  ? Discharge instructions   Complete by: As directed ?  ? F/u w/ PCP in 1-2 weeks. F/u w/ nephro in 1-2 weeks  ? Increase activity slowly   Complete by: As directed ?  ? ?  ? ?Allergies as  of 08/05/2021   ? ?   Reactions  ? Statins Other (See Comments)  ? "redness and facial swelling"  ? Cholesterol Rash  ? Cholesterol medicine  ? ?  ? ?  ?Medication List  ?  ? ?STOP taking these medications   ? ?furosemide 80 MG tablet ?Commonly known as: Lasix ?  ? ?  ? ?TAKE these medications   ? ?acetaminophen 500 MG tablet ?Commonly known as: TYLENOL ?Take 500-1,000 mg by mouth every 6 (six) hours as needed (pain). ?  ?amLODipine 10 MG tablet ?Commonly known as: NORVASC ?Take  1 tablet (10 mg total) by mouth once daily. ?  ?Basaglar KwikPen 100 UNIT/ML ?Inject 14 Units into the skin once daily at bedtime. ?What changed: how much to take ?  ?Blood Pressure Kit ?USE AS DIRECTED. ?  ?CALCITRIOL IV ?Inject 2 mcg into the vein 3 (three) times a week. ?  ?Comfort EZ Pen Needles 32G X 4 MM Misc ?Generic drug: Insulin Pen Needle ?USE AS DIRECTED ONCE DAILY AT BEDTIME. ?  ?diphenhydrAMINE 25 MG tablet ?Commonly known as: BENADRYL ?Take 25 mg by mouth daily as needed (before dialysis). Taking as needed ?  ?epoetin alfa 10000 UNIT/ML injection ?Commonly known as: EPOGEN ?Inject 1,400 Units into the vein 3 (three) times a week. ?  ?HAIR SKIN & NAILS GUMMIES PO ?Take 2 tablets by mouth daily. ?  ?lidocaine-prilocaine cream ?Commonly known as: EMLA ?Apply 1 application topically to the affected area(s) daily as needed. ?  ?metoprolol succinate 50 MG 24 hr tablet ?Commonly known as: TOPROL-XL ?Take 1 tablet (50 mg total) by mouth once daily. Take with or immediately following a meal. ?  ?omeprazole 20 MG capsule ?Commonly known as: PRILOSEC ?Take 1 capsule (20 mg total) by mouth once daily. ?  ?Rightest GL300 Lancets Misc ?USE AS DIRECTED UP TO FOUR TIMES DAILY. ?  ?Rightest GM550 Blood Glucose w/Device Kit ?Use up to four times daily as directed to check blood sugar. ?  ?Rightest GS550 Blood Glucose test strip ?Generic drug: glucose blood ?USE AS DIRECTED UP TO FOUR TIMES DAILY. ?  ?sevelamer carbonate 800 MG tablet ?Commonly known as: Renvela ?Take 2 tablets (1,600 mg total) by mouth 3 (three) times daily with a meal. ?  ?VENOFER IV ?Inject 100 mg into the vein 3 (three) times a week. ?  ? ?  ? ? ?Allergies  ?Allergen Reactions  ? Statins Other (See Comments)  ?  "redness and facial swelling"  ? Cholesterol Rash  ?  Cholesterol medicine  ? ? ?Consultations: ?Nephro: Dr. Candiss Norse  ?Vascular surg: Dr. Delana Meyer  ? ? ?Procedures/Studies: ?PERIPHERAL VASCULAR CATHETERIZATION ? ?Result Date: 08/03/2021 ?See  surgical note for result. ? ?PERIPHERAL VASCULAR CATHETERIZATION ? ?Result Date: 07/22/2021 ?See surgical note for result. ? ?DG Chest Port 1 View ? ?Result Date: 07/31/2021 ?CLINICAL DATA:  Shortness of breath. Clogged access port for dialysis. Last dialysis on Wednesday on a Monday Wednesday Friday schedule. EXAM: PORTABLE CHEST 1 VIEW COMPARISON:  01/03/2021 FINDINGS: Interval removal of right internal jugular dual-lumen central venous catheter. Cardiac silhouette is again mildly to moderately enlarged. Mediastinal contours are within normal limits. Mild bilateral interstitial thickening is similar to prior. No pleural effusion or pneumothorax. No acute skeletal abnormality. IMPRESSION: Cardiomegaly and mild interstitial pulmonary edema, likely slightly improved compared to 01/03/2021. Interval removal of right internal jugular central venous dialysis catheter. Electronically Signed   By: Yvonne Kendall M.D.   On: 07/31/2021 14:56  ? ?VAS US DUPLEX DIALYSIS  ACCESS (AVF, AVG) ? ?Result Date: 07/12/2021 ?DIALYSIS ACCESS Patient Name:  [name]  Date of Exam:   07/08/2021 Medical Rec #: [id number]       Accession #:    [id number] Date of Birth: 10/26/1975      Patient Gender: F Patient Age:   73 years Exam Location:  [facility] Procedure:      VAS US DUPLEX DIALYSIS ACCESS (AVF, AVG) Referring Phys: Eulogio Ditch --------------------------------------------------------------------------------  Reason for Exam: Non-maturation of AVF. History: 11/20/2020: Left Brachial Cephalic AVF placement. Comparison Study: 01/21/2021 Performing Technologist: Concha Norway RVT  Examination Guidelines: A complete evaluation includes B-mode imaging, spectral Doppler, color Doppler, and power Doppler as needed of all accessible portions of each vessel. Unilateral testing is considered an integral part of a complete examination. Limited examinations for reoccurring indications may be performed as noted.  Findings:  +--------------------+----------+-----------------+--------+ AVF                 PSV (cm/s)Flow Vol (mL/min)Comments +--------------------+----------+-----------------+--------+ Native artery inflow   265          371

## 2021-08-12 ENCOUNTER — Ambulatory Visit: Payer: Self-pay | Admitting: Gerontology

## 2021-08-12 ENCOUNTER — Other Ambulatory Visit: Payer: Self-pay

## 2021-08-12 ENCOUNTER — Encounter: Payer: Self-pay | Admitting: Gerontology

## 2021-08-12 VITALS — BP 150/86 | HR 79 | Temp 97.8°F | Resp 16 | Ht 64.0 in | Wt 272.4 lb

## 2021-08-12 DIAGNOSIS — E1122 Type 2 diabetes mellitus with diabetic chronic kidney disease: Secondary | ICD-10-CM

## 2021-08-12 DIAGNOSIS — I10 Essential (primary) hypertension: Secondary | ICD-10-CM

## 2021-08-12 DIAGNOSIS — Z09 Encounter for follow-up examination after completed treatment for conditions other than malignant neoplasm: Secondary | ICD-10-CM | POA: Insufficient documentation

## 2021-08-12 NOTE — Progress Notes (Signed)
? ?Established Patient Office Visit ? ?Subjective:  ?Patient ID: [name], female    DOB: 04-18-1976  Age: 46 y.o. MRN: [id number] ? ?CC:  ?Chief Complaint  ?Patient presents with  ? Follow-up  ? ? ?HPI ?[name]   is a 46 year old female who history of Asthma,T2 Diabetes, Hypertension, Renal disorder on hemodialysis presents for a f/u on DM. She states that she checks her fasting blood glucose and it's usually  between 130-140 mg/dl. The blood glucose checked during this visit was 372 mg /dl, she reported  eating 4 mins ago. She continues to make healthy lifestyles changes. She said she is compliant with her medications but forgot to take her insulin last night because she was tired from going to dialysis..She denies any  symptoms hypo/hyperglycemia and peripheral nueropathy. She has right chest perm cath and left upper arm AV fistula with thrill and bruit for hemodialysis M,W,F. She was discharged from the hospital on 08/05/21 and during her hospital course  had difficulty accessing her left AV fistula and had right  chest tunneled HD permcath  done. She state that her mood is dysphoric, but denies  suicidal / homicidal ideation and declined seeing [facility] behavioral  health team. She states that she's doing well generally and denies any other complaints. ? ?Past Medical History:  ?Diagnosis Date  ? Anemia   ? Asthma   ? well controlled  ? Complication of anesthesia   ? "takes alot for anesthesia to work"  ? Concussion   ? h/o  ? Depression   ? Diabetes ([place])   ? last A1c was 5.8 on 09-04-20  ? Dialysis patient [facility])   ? T, Th, and Sat  ? Family history of adverse reaction to anesthesia   ? son-became aggressive after surgery  ? GERD (gastroesophageal reflux disease)   ? occ no meds  ? Headache   ? migraines  ? Hypertension   ? Palpitations   ? Pneumonia   ? h/o  ? Renal disorder   ? ? ?Past Surgical History:  ?Procedure Laterality Date  ? A/V FISTULAGRAM Left 06/22/2021  ? Procedure: A/V FISTULAGRAM;   Surgeon: Katha Cabal, MD;  Location: [place] [facility];  Service: Cardiovascular;  Laterality: Left;  ? AV FISTULA PLACEMENT Left 11/20/2020  ? Procedure: ARTERIOVENOUS (AV) FISTULA CREATION ( BRACHIAL CEPHALIC );  Surgeon: Katha Cabal, MD;  Location: [facility];  Service: Vascular;  Laterality: Left;  ? CESAREAN SECTION N/A   ? x3  ? CHOLECYSTECTOMY    ? DIALYSIS/PERMA CATHETER INSERTION N/A 08/24/2020  ? Procedure: DIALYSIS/PERMA CATHETER INSERTION;  Surgeon: Algernon Huxley, MD;  Location: [place] [facility];  Service: Cardiovascular;  Laterality: N/A;  ? DIALYSIS/PERMA CATHETER INSERTION N/A 08/03/2021  ? Procedure: DIALYSIS/PERMA CATHETER INSERTION;  Surgeon: Katha Cabal, MD;  Location: [place] [facility];  Service: Cardiovascular;  Laterality: N/A;  ? DIALYSIS/PERMA CATHETER REMOVAL N/A 07/22/2021  ? Procedure: DIALYSIS/PERMA CATHETER REMOVAL;  Surgeon: Algernon Huxley, MD;  Location: [place] [facility];  Service: Cardiovascular;  Laterality: N/A;  ? ? ?Family History  ?Problem Relation Age of Onset  ? Diabetes Mother   ? Skin cancer Mother   ? Heart attack Father   ?     died at ~28 years of age  ? Other Sister   ?     hit by a motorcycle  ? Alcohol abuse Brother   ? Diabetes Brother   ? HIV Brother   ? ? ?  Social History  ? ?Socioeconomic History  ? Marital status: Married  ?  Spouse name: Not on file  ? Number of children: Not on file  ? Years of education: Not on file  ? Highest education level: Not on file  ?Occupational History  ? Not on file  ?Tobacco Use  ? Smoking status: Never  ? Smokeless tobacco: Never  ?Vaping Use  ? Vaping Use: Never used  ?Substance and Sexual Activity  ? Alcohol use: Never  ? Drug use: Never  ? Sexual activity: Not on file  ?Other Topics Concern  ? Not on file  ?Social History Narrative  ? Not on file  ? ?Social Determinants of Health  ? ?Financial Resource Strain: Not on file  ?Food Insecurity: Food Insecurity Present  ? Worried About Charity fundraiser in  the Last Year: Often true  ? Ran Out of Food in the Last Year: Often true  ?Transportation Needs: Unmet Transportation Needs  ? Lack of Transportation (Medical): Yes  ? Lack of Transportation (Non-Medical): Yes  ?Physical Activity: Not on file  ?Stress: Not on file  ?Social Connections: Not on file  ?Intimate Partner Violence: Not on file  ? ? ?Outpatient Medications Prior to Visit  ?Medication Sig Dispense Refill  ? acetaminophen (TYLENOL) 500 MG tablet Take 500-1,000 mg by mouth every 6 (six) hours as needed (pain).    ? amLODipine (NORVASC) 10 MG tablet Take 1 tablet (10 mg total) by mouth once daily. 90 tablet 1  ? Biotin w/ Vitamins C & E (HAIR SKIN & NAILS GUMMIES PO) Take 2 tablets by mouth daily.    ? Blood Glucose Monitoring Suppl (RIGHTEST [id number] BLOOD GLUCOSE) w/Device KIT Use up to four times daily as directed to check blood sugar. 1 kit 0  ? Blood Pressure KIT USE AS DIRECTED. 1 kit 0  ? CALCITRIOL IV Inject 2 mcg into the vein 3 (three) times a week.    ? diphenhydrAMINE (BENADRYL) 25 MG tablet Take 25 mg by mouth daily as needed (before dialysis). Taking as needed    ? epoetin alfa (EPOGEN) 10000 UNIT/ML injection Inject 1,400 Units into the vein 3 (three) times a week.    ? glucose blood (RIGHTEST GS550 BLOOD GLUCOSE) test strip USE AS DIRECTED UP TO FOUR TIMES DAILY. 100 strip 0  ? Insulin Glargine (BASAGLAR KWIKPEN) 100 UNIT/ML Inject 14 Units into the skin once daily at bedtime. (Patient taking differently: Inject 10 Units into the skin at bedtime.) 15 mL 3  ? Insulin Pen Needle 32G X 4 MM MISC USE AS DIRECTED ONCE DAILY AT BEDTIME. 100 each 2  ? Iron Sucrose (VENOFER IV) Inject 100 mg into the vein 3 (three) times a week.    ? lidocaine-prilocaine (EMLA) cream Apply 1 application topically to the affected area(s) daily as needed. 30 g 4  ? metoprolol succinate (TOPROL-XL) 50 MG 24 hr tablet Take 1 tablet (50 mg total) by mouth once daily. Take with or immediately following a meal. 90 tablet 1  ?  omeprazole (PRILOSEC) 20 MG capsule Take 1 capsule (20 mg total) by mouth once daily. 30 capsule 3  ? Rightest GL300 Lancets MISC USE AS DIRECTED UP TO FOUR TIMES DAILY. 100 each 0  ? sevelamer carbonate (RENVELA) 800 MG tablet Take 2 tablets (1,600 mg total) by mouth 3 (three) times daily with a meal. 180 tablet 11  ? ?No facility-administered medications prior to visit.  ? ? ?Allergies  ?Allergen Reactions  ?  Statins Other (See Comments)  ?  "redness and facial swelling"  ? Cholesterol Rash  ?  Cholesterol medicine  ? ? ?ROS ?Review of Systems  ?Constitutional: Negative.   ?HENT: Negative.    ?Eyes: Negative.   ?Gastrointestinal: Negative.   ?Endocrine: Negative.   ?Genitourinary: Negative.   ?Musculoskeletal: Negative.   ?Skin: Negative.   ?Neurological: Negative.   ?Psychiatric/Behavioral: Negative.    ? ?  ?Objective:  ?  ?Physical Exam ?Constitutional:   ?   Appearance: Normal appearance.  ?HENT:  ?   Head: Normocephalic and atraumatic.  ?   Mouth/Throat:  ?   Mouth: Mucous membranes are moist.  ?Eyes:  ?   Extraocular Movements: Extraocular movements intact.  ?   Conjunctiva/sclera: Conjunctivae normal.  ?   Pupils: Pupils are equal, round, and reactive to light.  ?Cardiovascular:  ?   Rate and Rhythm: Normal rate and regular rhythm.  ?   Pulses: Normal pulses.  ?   Heart sounds: Normal heart sounds.  ?Pulmonary:  ?   Effort: Pulmonary effort is normal.  ?   Breath sounds: Normal breath sounds.  ?Musculoskeletal:  ?   Cervical back: Normal range of motion.  ?Skin: ?   General: Skin is warm and dry.  ?Neurological:  ?   General: No focal deficit present.  ?   Mental Status: She is alert and oriented to person, place, and time.  ?Psychiatric:     ?   Mood and Affect: Mood normal.     ?   Behavior: Behavior normal.     ?   Thought Content: Thought content normal.     ?   Judgment: Judgment normal.  ? ? ?BP (!) 150/86 (BP Location: Right Arm, Patient Position: Sitting, Cuff Size: Large)   Pulse 79   Temp 97.8  ?F (36.6 ?C) (Oral)   Resp 16   Ht '5\' 4"'  (1.626 m)   Wt 272 lb 6.4 oz (123.6 kg)   LMP 08/12/2021 (Exact Date)   SpO2 96%   BMI 46.76 kg/m?  ?Wt Readings from Last 3 Encounters:  ?08/12/21 272 lb 6.4 oz (123.6 kg)

## 2021-08-12 NOTE — Patient Instructions (Signed)

## 2021-08-13 ENCOUNTER — Telehealth (INDEPENDENT_AMBULATORY_CARE_PROVIDER_SITE_OTHER): Payer: Self-pay

## 2021-08-13 ENCOUNTER — Other Ambulatory Visit: Payer: Self-pay

## 2021-08-13 NOTE — Telephone Encounter (Signed)
Spoke with the patient and she is scheduled with Dr. Lucky Cowboy for a left supedrficialization of fistula on 08/25/21 at the [facility]. Pre-surgical instructions were discussed and will be mailed as well faxed to [facility]. ?

## 2021-08-19 ENCOUNTER — Other Ambulatory Visit (INDEPENDENT_AMBULATORY_CARE_PROVIDER_SITE_OTHER): Payer: Self-pay | Admitting: Nurse Practitioner

## 2021-08-19 ENCOUNTER — Encounter: Payer: Self-pay | Admitting: Anesthesiology

## 2021-08-19 ENCOUNTER — Other Ambulatory Visit
Admission: RE | Admit: 2021-08-19 | Discharge: 2021-08-19 | Disposition: A | Discharge status: Home / Self Care | Payer: Self-pay | Source: Ambulatory Visit | Attending: Vascular Surgery | Admitting: Vascular Surgery

## 2021-08-19 DIAGNOSIS — Z992 Dependence on renal dialysis: Secondary | ICD-10-CM | POA: Insufficient documentation

## 2021-08-19 DIAGNOSIS — N186 End stage renal disease: Secondary | ICD-10-CM | POA: Insufficient documentation

## 2021-08-19 DIAGNOSIS — Z01818 Encounter for other preprocedural examination: Secondary | ICD-10-CM | POA: Insufficient documentation

## 2021-08-19 HISTORY — DX: Sleep apnea, unspecified: G47.30

## 2021-08-19 HISTORY — DX: Chronic obstructive pulmonary disease, unspecified: J44.9

## 2021-08-19 LAB — CBC WITH DIFFERENTIAL/PLATELET
Abs Immature Granulocytes: 0.02 10*3/uL (ref 0.00–0.07)
Basophils Absolute: 0.1 10*3/uL (ref 0.0–0.1)
Basophils Relative: 1 %
Eosinophils Absolute: 0.2 10*3/uL (ref 0.0–0.5)
Eosinophils Relative: 2 %
HCT: 29.1 % — ABNORMAL LOW (ref 36.0–46.0)
Hemoglobin: 9.9 g/dL — ABNORMAL LOW (ref 12.0–15.0)
Immature Granulocytes: 0 %
Lymphocytes Relative: 23 %
Lymphs Abs: 1.9 10*3/uL (ref 0.7–4.0)
MCH: 31.2 pg (ref 26.0–34.0)
MCHC: 34 g/dL (ref 30.0–36.0)
MCV: 91.8 fL (ref 80.0–100.0)
Monocytes Absolute: 0.4 10*3/uL (ref 0.1–1.0)
Monocytes Relative: 5 %
Neutro Abs: 5.6 10*3/uL (ref 1.7–7.7)
Neutrophils Relative %: 69 %
Platelets: 318 10*3/uL (ref 150–400)
RBC: 3.17 MIL/uL — ABNORMAL LOW (ref 3.87–5.11)
RDW: 13.2 % (ref 11.5–15.5)
WBC: 8.1 10*3/uL (ref 4.0–10.5)
nRBC: 0 % (ref 0.0–0.2)

## 2021-08-19 LAB — BASIC METABOLIC PANEL WITH GFR
Anion gap: 15 (ref 5–15)
BUN: 49 mg/dL — ABNORMAL HIGH (ref 6–20)
CO2: 24 mmol/L (ref 22–32)
Calcium: 7.7 mg/dL — ABNORMAL LOW (ref 8.9–10.3)
Chloride: 97 mmol/L — ABNORMAL LOW (ref 98–111)
Creatinine, Ser: 8.54 mg/dL — ABNORMAL HIGH (ref 0.44–1.00)
GFR, Estimated: 5 mL/min — ABNORMAL LOW
Glucose, Bld: 247 mg/dL — ABNORMAL HIGH (ref 70–99)
Potassium: 4.5 mmol/L (ref 3.5–5.1)
Sodium: 136 mmol/L (ref 135–145)

## 2021-08-19 LAB — TYPE AND SCREEN
ABO/RH(D): A POS
Antibody Screen: NEGATIVE

## 2021-08-19 NOTE — Patient Instructions (Addendum)
?Your procedure is scheduled on: Wednesday August 25, 2021. ?Report to Day Surgery inside [facility] 2nd floor, stop by admission desk before getting on elevator.  ?To find out your arrival time please call [phone] between 1PM - 3PM on Tuesday August 24, 2021. ? ?Remember: Instructions that are not followed completely may result in serious medical risk,  ?up to and including death, or upon the discretion of your surgeon and anesthesiologist your  ?surgery may need to be rescheduled.  ? ?  _X__ 1. Do not eat food or drink fluids after midnight the night before your procedure. ?                No chewing gum or hard candies.  ? ?__X__2.  On the morning of surgery brush your teeth with toothpaste and water, you ?               may rinse your mouth with mouthwash if you wish.  Do not swallow any toothpaste or mouthwash. ?   ? _X__ 3.  No Alcohol for 24 hours before or after surgery. ? ? _X__ 4.  Do Not Smoke or use e-cigarettes For 24 Hours Prior to Your Surgery. ?                Do not use any chewable tobacco products for at least 6 hours prior to ?                Surgery. ? ?_X__  5.  Do not use any recreational drugs (marijuana, cocaine, heroin, ecstasy, MDMA or other) ?               For at least one week prior to your surgery.  Combination of these drugs with anesthesia ?               May have life threatening results. ? ?____  6.  Bring all medications with you on the day of surgery if instructed.  ? ?__X__  7.  Notify your doctor if there is any change in your medical condition  ?    (cold, fever, infections). ?    ?Do not wear jewelry, make-up, hairpins, clips or nail polish. ?Do not wear lotions, powders, or perfumes. You may wear deodorant. ?Do not shave 48 hours prior to surgery. Men may shave face and neck. ?Do not bring valuables to the hospital.   ? ?[facility] is not responsible for any belongings or valuables. ? ?Contacts, dentures or bridgework may not be worn into  surgery. ?Leave your suitcase in the car. After surgery it may be brought to your room. ?For patients admitted to the hospital, discharge time is determined by your ?treatment team. ?  ?Patients discharged the day of surgery will not be allowed to drive home.   ?Make arrangements for someone to be with you for the first 24 hours of your ?Same Day Discharge. ? ? ?__X__ Take these medicines the morning of surgery with A SIP OF WATER:  ? ? 1. omeprazole (PRILOSEC) 20 MG  ? 2.   ? 3.  ? 4. ? 5. ? 6. ? ?____ Fleet Enema (as directed)  ? ?__X__ Use CHG Soap (or wipes) as directed ? ?____ Use Benzoyl Peroxide Gel as instructed ? ?____ Use inhalers on the day of surgery ? ?____ Stop metformin 2 days prior to surgery   ? ?__X__ Take 1/2 of usual insulin dose the night before surgery. No insulin the morning ?  of surgery. Insulin Glargine (BASAGLAR KWIKPEN) 100 UNIT/ML ? ?____ Call your PCP, cardiologist, or Pulmonologist if taking Coumadin/Plavix/aspirin and ask when to stop before your surgery.  ? ?__X__ One Week prior to surgery- Stop Anti-inflammatories such as Ibuprofen, Aleve, Advil, Motrin, meloxicam (MOBIC), diclofenac, etodolac, ketorolac, Toradol, Daypro, piroxicam, Goody's or BC powders. OK TO USE TYLENOL IF NEEDED ?  ?__X__ Stop supplements until after surgery.   ? ?____ Bring C-Pap to the hospital.  ? ? ?If you have any questions regarding your pre-procedure instructions,  ?Please call Pre-admit Testing at [phone] ?

## 2021-08-24 MED ORDER — CEFAZOLIN SODIUM-DEXTROSE 2-4 GM/100ML-% IV SOLN: 2.0000 g | INTRAVENOUS | Status: DC

## 2021-08-24 MED ORDER — LACTATED RINGERS IV SOLN
INTRAVENOUS | Status: DC
Start: 2021-08-24 — End: 2021-08-25

## 2021-08-24 MED ORDER — CHLORHEXIDINE GLUCONATE CLOTH 2 % EX PADS: 6.0000 | MEDICATED_PAD | Freq: Once | CUTANEOUS | Status: DC

## 2021-08-24 MED ORDER — ORAL CARE MOUTH RINSE: 15.0000 mL | Freq: Once | OROMUCOSAL | Status: DC

## 2021-08-24 MED ORDER — CHLORHEXIDINE GLUCONATE 0.12 % MT SOLN: 15.0000 mL | Freq: Once | OROMUCOSAL | Status: DC

## 2021-08-25 ENCOUNTER — Encounter
Admission: RE | Disposition: A | Discharge status: Still In Hospital | Payer: Self-pay | Source: Home / Self Care | Attending: Vascular Surgery

## 2021-08-25 ENCOUNTER — Ambulatory Visit
Admission: RE | Admit: 2021-08-25 | Discharge: 2021-08-25 | Disposition: A | Discharge status: Still In Hospital | Payer: Self-pay | Attending: Vascular Surgery | Admitting: Vascular Surgery

## 2021-08-25 ENCOUNTER — Encounter: Payer: Self-pay | Admitting: Vascular Surgery

## 2021-08-25 ENCOUNTER — Encounter: Payer: Self-pay | Admitting: Emergency Medicine

## 2021-08-25 ENCOUNTER — Other Ambulatory Visit: Payer: Self-pay

## 2021-08-25 ENCOUNTER — Emergency Department
Admission: EM | Admit: 2021-08-25 | Discharge: 2021-08-25 | Disposition: A | Discharge status: Home / Self Care | Payer: Self-pay | Attending: Emergency Medicine | Admitting: Emergency Medicine

## 2021-08-25 DIAGNOSIS — Z79899 Other long term (current) drug therapy: Secondary | ICD-10-CM | POA: Insufficient documentation

## 2021-08-25 DIAGNOSIS — Z794 Long term (current) use of insulin: Secondary | ICD-10-CM | POA: Insufficient documentation

## 2021-08-25 DIAGNOSIS — F32A Depression, unspecified: Secondary | ICD-10-CM

## 2021-08-25 DIAGNOSIS — N186 End stage renal disease: Secondary | ICD-10-CM | POA: Insufficient documentation

## 2021-08-25 DIAGNOSIS — Z992 Dependence on renal dialysis: Secondary | ICD-10-CM | POA: Insufficient documentation

## 2021-08-25 DIAGNOSIS — E1122 Type 2 diabetes mellitus with diabetic chronic kidney disease: Secondary | ICD-10-CM | POA: Insufficient documentation

## 2021-08-25 DIAGNOSIS — I120 Hypertensive chronic kidney disease with stage 5 chronic kidney disease or end stage renal disease: Secondary | ICD-10-CM | POA: Insufficient documentation

## 2021-08-25 DIAGNOSIS — Y900 Blood alcohol level of less than 20 mg/100 ml: Secondary | ICD-10-CM | POA: Insufficient documentation

## 2021-08-25 DIAGNOSIS — Z01818 Encounter for other preprocedural examination: Secondary | ICD-10-CM | POA: Insufficient documentation

## 2021-08-25 DIAGNOSIS — F4310 Post-traumatic stress disorder, unspecified: Secondary | ICD-10-CM

## 2021-08-25 DIAGNOSIS — Z538 Procedure and treatment not carried out for other reasons: Secondary | ICD-10-CM | POA: Insufficient documentation

## 2021-08-25 DIAGNOSIS — J449 Chronic obstructive pulmonary disease, unspecified: Secondary | ICD-10-CM | POA: Insufficient documentation

## 2021-08-25 DIAGNOSIS — Z20822 Contact with and (suspected) exposure to covid-19: Secondary | ICD-10-CM | POA: Insufficient documentation

## 2021-08-25 DIAGNOSIS — D649 Anemia, unspecified: Secondary | ICD-10-CM | POA: Insufficient documentation

## 2021-08-25 DIAGNOSIS — F419 Anxiety disorder, unspecified: Secondary | ICD-10-CM | POA: Insufficient documentation

## 2021-08-25 DIAGNOSIS — R45851 Suicidal ideations: Secondary | ICD-10-CM

## 2021-08-25 DIAGNOSIS — J45909 Unspecified asthma, uncomplicated: Secondary | ICD-10-CM | POA: Insufficient documentation

## 2021-08-25 DIAGNOSIS — I10 Essential (primary) hypertension: Secondary | ICD-10-CM

## 2021-08-25 LAB — COMPREHENSIVE METABOLIC PANEL
ALT: 16 U/L (ref 0–44)
AST: 17 U/L (ref 15–41)
Albumin: 3.8 g/dL (ref 3.5–5.0)
Alkaline Phosphatase: 123 U/L (ref 38–126)
Anion gap: 12 (ref 5–15)
BUN: 37 mg/dL — ABNORMAL HIGH (ref 6–20)
CO2: 26 mmol/L (ref 22–32)
Calcium: 7.9 mg/dL — ABNORMAL LOW (ref 8.9–10.3)
Chloride: 97 mmol/L — ABNORMAL LOW (ref 98–111)
Creatinine, Ser: 6.98 mg/dL — ABNORMAL HIGH (ref 0.44–1.00)
GFR, Estimated: 7 mL/min — ABNORMAL LOW (ref >60)
Glucose, Bld: 156 mg/dL — ABNORMAL HIGH (ref 70–99)
Potassium: 4.4 mmol/L (ref 3.5–5.1)
Sodium: 135 mmol/L (ref 135–145)
Total Bilirubin: 0.7 mg/dL (ref 0.3–1.2)
Total Protein: 8 g/dL (ref 6.5–8.1)

## 2021-08-25 LAB — URINE DRUG SCREEN, QUALITATIVE (ARMC ONLY)
Amphetamines, Ur Screen: NOT DETECTED
Barbiturates, Ur Screen: NOT DETECTED
Benzodiazepine, Ur Scrn: NOT DETECTED
Cannabinoid 50 Ng, Ur ~~LOC~~: NOT DETECTED
Cocaine Metabolite,Ur ~~LOC~~: NOT DETECTED
MDMA (Ecstasy)Ur Screen: NOT DETECTED
Methadone Scn, Ur: NOT DETECTED
Opiate, Ur Screen: NOT DETECTED
Phencyclidine (PCP) Ur S: NOT DETECTED
Tricyclic, Ur Screen: NOT DETECTED

## 2021-08-25 LAB — RESP PANEL BY RT-PCR (FLU A&B, COVID) ARPGX2
Influenza A by PCR: NEGATIVE
Influenza B by PCR: NEGATIVE
SARS Coronavirus 2 by RT PCR: NEGATIVE

## 2021-08-25 LAB — POCT I-STAT, CHEM 8
BUN: 31 mg/dL — ABNORMAL HIGH (ref 6–20)
Calcium, Ion: 0.92 mmol/L — ABNORMAL LOW (ref 1.15–1.40)
Chloride: 101 mmol/L (ref 98–111)
Creatinine, Ser: 8 mg/dL — ABNORMAL HIGH (ref 0.44–1.00)
Glucose, Bld: 165 mg/dL — ABNORMAL HIGH (ref 70–99)
HCT: 29 % — ABNORMAL LOW (ref 36.0–46.0)
Hemoglobin: 9.9 g/dL — ABNORMAL LOW (ref 12.0–15.0)
Potassium: 4.2 mmol/L (ref 3.5–5.1)
Sodium: 138 mmol/L (ref 135–145)
TCO2: 25 mmol/L (ref 22–32)

## 2021-08-25 LAB — CBC WITH DIFFERENTIAL/PLATELET
Abs Immature Granulocytes: 0.03 10*3/uL (ref 0.00–0.07)
Basophils Absolute: 0.1 10*3/uL (ref 0.0–0.1)
Basophils Relative: 1 %
Eosinophils Absolute: 0.1 10*3/uL (ref 0.0–0.5)
Eosinophils Relative: 2 %
HCT: 29.6 % — ABNORMAL LOW (ref 36.0–46.0)
Hemoglobin: 9.8 g/dL — ABNORMAL LOW (ref 12.0–15.0)
Immature Granulocytes: 0 %
Lymphocytes Relative: 22 %
Lymphs Abs: 1.5 10*3/uL (ref 0.7–4.0)
MCH: 30.5 pg (ref 26.0–34.0)
MCHC: 33.1 g/dL (ref 30.0–36.0)
MCV: 92.2 fL (ref 80.0–100.0)
Monocytes Absolute: 0.3 10*3/uL (ref 0.1–1.0)
Monocytes Relative: 5 %
Neutro Abs: 5 10*3/uL (ref 1.7–7.7)
Neutrophils Relative %: 70 %
Platelets: 315 10*3/uL (ref 150–400)
RBC: 3.21 MIL/uL — ABNORMAL LOW (ref 3.87–5.11)
RDW: 12.9 % (ref 11.5–15.5)
WBC: 7 10*3/uL (ref 4.0–10.5)
nRBC: 0 % (ref 0.0–0.2)

## 2021-08-25 LAB — ETHANOL: Alcohol, Ethyl (B): <10 mg/dL (ref <10)

## 2021-08-25 LAB — POCT PREGNANCY, URINE: Preg Test, Ur: NEGATIVE

## 2021-08-25 SURGERY — FISTULA SUPERFICIALIZATION
Anesthesia: General | Laterality: Left

## 2021-08-25 MED ORDER — MIDAZOLAM HCL 2 MG/2ML IJ SOLN
INTRAMUSCULAR | Status: AC
  Filled 2021-08-25: qty 2

## 2021-08-25 MED ORDER — CHLORHEXIDINE GLUCONATE 0.12 % MT SOLN
OROMUCOSAL | Status: AC
  Filled 2021-08-25: qty 15

## 2021-08-25 MED ORDER — CEFAZOLIN SODIUM-DEXTROSE 2-4 GM/100ML-% IV SOLN
INTRAVENOUS | Status: AC
  Filled 2021-08-25: qty 100

## 2021-08-25 MED ORDER — AMLODIPINE BESYLATE 5 MG PO TABS
10.0000 mg | ORAL_TABLET | Freq: Every day | ORAL | Status: DC
  Administered 2021-08-25: 10 mg via ORAL
  Filled 2021-08-25: qty 2

## 2021-08-25 MED ORDER — FENTANYL CITRATE (PF) 100 MCG/2ML IJ SOLN
INTRAMUSCULAR | Status: AC
  Filled 2021-08-25: qty 2

## 2021-08-25 SURGICAL SUPPLY — 54 items
APPLIER CLIP 11 MED OPEN (CLIP)
APPLIER CLIP 9.375 SM OPEN (CLIP)
BAG DECANTER FOR FLEXI CONT (MISCELLANEOUS) ×2 IMPLANT
BLADE SURG 15 STRL LF DISP TIS (BLADE) ×1 IMPLANT
BLADE SURG 15 STRL SS (BLADE) ×1
BLADE SURG SZ11 CARB STEEL (BLADE) ×2 IMPLANT
BOOT SUTURE AID YELLOW STND (SUTURE) ×2 IMPLANT
BRUSH SCRUB EZ  4% CHG (MISCELLANEOUS) ×1
BRUSH SCRUB EZ 4% CHG (MISCELLANEOUS) ×1 IMPLANT
CHLORAPREP W/TINT 26 (MISCELLANEOUS) ×2 IMPLANT
CLIP APPLIE 11 MED OPEN (CLIP) IMPLANT
CLIP APPLIE 9.375 SM OPEN (CLIP) IMPLANT
DERMABOND ADVANCED (GAUZE/BANDAGES/DRESSINGS) ×1
DERMABOND ADVANCED .7 DNX12 (GAUZE/BANDAGES/DRESSINGS) ×1 IMPLANT
ELECT CAUTERY BLADE 6.4 (BLADE) ×2 IMPLANT
ELECT REM PT RETURN 9FT ADLT (ELECTROSURGICAL) ×2
ELECTRODE REM PT RTRN 9FT ADLT (ELECTROSURGICAL) ×1 IMPLANT
GLOVE SURG SYN 7.0 (GLOVE) ×2 IMPLANT
GOWN STRL REUS W/ TWL LRG LVL3 (GOWN DISPOSABLE) ×1 IMPLANT
GOWN STRL REUS W/ TWL XL LVL3 (GOWN DISPOSABLE) ×1 IMPLANT
GOWN STRL REUS W/TWL LRG LVL3 (GOWN DISPOSABLE) ×1
GOWN STRL REUS W/TWL XL LVL3 (GOWN DISPOSABLE) ×1
HEMOSTAT SURGICEL 2X3 (HEMOSTASIS) ×2 IMPLANT
IV NS 500ML (IV SOLUTION) ×1
IV NS 500ML BAXH (IV SOLUTION) ×1 IMPLANT
KIT TURNOVER KIT A (KITS) ×2 IMPLANT
LABEL OR SOLS (LABEL) ×2 IMPLANT
LOOP RED MAXI  1X406MM (MISCELLANEOUS) ×1
LOOP VESSEL MAXI 1X406 RED (MISCELLANEOUS) ×1 IMPLANT
LOOP VESSEL MINI 0.8X406 BLUE (MISCELLANEOUS) ×2 IMPLANT
LOOPS BLUE MINI 0.8X406MM (MISCELLANEOUS) ×2
MANIFOLD NEPTUNE II (INSTRUMENTS) ×2 IMPLANT
NEEDLE FILTER BLUNT 18X 1/2SAF (NEEDLE) ×1
NEEDLE FILTER BLUNT 18X1 1/2 (NEEDLE) ×1 IMPLANT
PACK EXTREMITY ARMC (MISCELLANEOUS) ×2 IMPLANT
PAD PREP 24X41 OB/GYN DISP (PERSONAL CARE ITEMS) ×2 IMPLANT
SPIKE FLUID TRANSFER (MISCELLANEOUS) ×2 IMPLANT
STOCKINETTE STRL 4IN 9604848 (GAUZE/BANDAGES/DRESSINGS) ×2 IMPLANT
SUT MNCRL 4-0 (SUTURE) ×2
SUT MNCRL 4-0 27XMFL (SUTURE) ×2
SUT PROLENE 6 0 BV (SUTURE) ×8 IMPLANT
SUT SILK 2 0 (SUTURE) ×1
SUT SILK 2 0 SH (SUTURE) IMPLANT
SUT SILK 2-0 18XBRD TIE 12 (SUTURE) ×1 IMPLANT
SUT SILK 3 0 (SUTURE) ×1
SUT SILK 3-0 18XBRD TIE 12 (SUTURE) ×1 IMPLANT
SUT SILK 4 0 (SUTURE) ×1
SUT SILK 4-0 18XBRD TIE 12 (SUTURE) ×1 IMPLANT
SUT VIC AB 3-0 SH 27 (SUTURE) ×3
SUT VIC AB 3-0 SH 27X BRD (SUTURE) ×3 IMPLANT
SUTURE MNCRL 4-0 27XMF (SUTURE) ×2 IMPLANT
SYR 20ML LL LF (SYRINGE) ×2 IMPLANT
SYR 3ML LL SCALE MARK (SYRINGE) ×2 IMPLANT
WATER STERILE IRR 500ML POUR (IV SOLUTION) ×2 IMPLANT

## 2021-08-25 NOTE — ED Notes (Signed)
Called same day surgery to give report and explain that pt is psych cleared. Explained to RN on phone that pt needs scheduled procedure.  ? ?They will call back.  ?
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
## 2021-08-25 NOTE — Progress Notes (Signed)
This patient verbalized not feeling safe at home and precently having suicidal thoughts,and that she has had a plan to throw herself from a bridge. Patient also stated that she has taken some pills before and that she wishes not to get dialysis some times, but it has not work. [facility] was called to verify about this pt's follow up with Langley Gauss who this  pt stated that she was seen for counseling; [facility] stated that this pt has not been seen in their office since last July and that Langley Gauss does not work there anymore. MD Lucky Cowboy was notified, this procedure was cancelled; his patient was escorted to the Emergency Room for further evaluation.continue to monitor. ?

## 2021-08-25 NOTE — Discharge Instructions (Addendum)
Please follow-up with your that you previously use.  If needed you may also follow-up with [facility] and I will attach the information.  Your blood pressure was elevated today and please make sure you are taking your blood pressure medications and have this rechecked. ?
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
## 2021-08-25 NOTE — ED Notes (Signed)
TTS at bedside with pt. ?

## 2021-08-25 NOTE — ED Notes (Signed)
This RN and EDP spoke with pt at bedside. Pt was at same day surgery to have dialysis fistula surgically repaired because it was not functioning. Pt disclosed to staff there that she felt suicidal and was sent to ED for psych eval. Pt states to this RN and EDP that she is not actively suicidal but has had fleeting SI for years and has had traumatic events in her life that have caused her to feel depressed and anxious. ?Pt is crying. Pt is cooperative and calm at this time. ?

## 2021-08-25 NOTE — Anesthesia Preprocedure Evaluation (Deleted)
Anesthesia Evaluation  ?Patient identified by MRN, date of birth, ID band ?Patient awake ? ? ? ?Reviewed: ?Allergy & Precautions, NPO status , Patient's Chart, lab work & pertinent test results, reviewed documented beta blocker date and time  ? ?History of Anesthesia Complications ?Negative for: history of anesthetic complications ? ?Airway ?Mallampati: III ? ?TM Distance: >3 FB ?Neck ROM: Full ? ? ? Dental ? ?(+) Poor Dentition ?  ?Pulmonary ?asthma , sleep apnea (does not use CPAP) ,  ?  ?breath sounds clear to auscultation- rhonchi ?(-) wheezing ? ? ? ? ? Cardiovascular ?hypertension, Pt. on medications and Pt. on home beta blockers ?(-) CAD, (-) Past MI, (-) Cardiac Stents and (-) CABG  ?Rhythm:Regular Rate:Normal ?- Systolic murmurs and - Diastolic murmurs ?EKG 4/23: ?Normal sinus rhythm ?Left axis deviation ?Low voltage QRS ?Inferior infarct , age undetermined ?Prolonged QT ?Abnormal ECG ?When compared with ECG of 03-Jan-2021 20:03, ?No significant change was found ?Confirmed by Neoma Laming [phone]) on 08/20/2021 6:40:18 PM ? ?ECHO 7/23: ??1. Left ventricular ejection fraction, by estimation, is 55 to 60%. The  ?left ventricle has normal function. The left ventricle has no regional  ?wall motion abnormalities. There is mild concentric left ventricular  ?hypertrophy. Left ventricular diastolic  ?parameters are consistent with Grade I diastolic dysfunction (impaired  ?relaxation).  ??2. Right ventricular systolic function is normal. The right ventricular  ?size is normal.  ??3. The mitral valve is normal in structure. No evidence of mitral valve  ?regurgitation. No evidence of mitral stenosis.  ??4. The aortic valve is normal in structure. Aortic valve regurgitation is  ?not visualized. No aortic stenosis is present.  ??5. The inferior vena cava is normal in size with greater than 50%  ?respiratory variability, suggesting right atrial pressure of 3 mmHg.  ?  ?Neuro/Psych ?  Headaches, neg Seizures PSYCHIATRIC DISORDERS Anxiety Depression   ? GI/Hepatic ?Neg liver ROS, GERD  ,  ?Endo/Other  ?diabetes, Well Controlled, Type 2Morbid obesity ? Renal/GU ?ESRF and DialysisRenal disease (T, Th, and Sat)  ? ?  ?Musculoskeletal ?negative musculoskeletal ROS ?(+)  ? Abdominal ?(+) + obese,   ?Peds ? Hematology ? ?(+) Blood dyscrasia, anemia ,   ?Anesthesia Other Findings ?Past Medical History: ?No date: Anemia ?No date: Asthma ?    Comment:  well controlled ?No date: Complication of anesthesia ?    Comment:  "takes alot for anesthesia to work" ?No date: Concussion ?    Comment:  h/o ?No date: Depression ?No date: Diabetes [facility]) ?    Comment:  last A1c was 5.8 on 09-04-20 ?No date: Dialysis patient [facility]) ?    Comment:  T, Th, and Sat ?No date: Family history of adverse reaction to anesthesia ?    Comment:  son-became aggressive after surgery ?No date: GERD (gastroesophageal reflux disease) ?    Comment:  occ no meds ?No date: Headache ?    Comment:  migraines ?No date: Hypertension ?No date: Palpitations ?No date: Pneumonia ?    Comment:  h/o ?No date: Renal disorder ? ? Reproductive/Obstetrics ? ?  ? ? ? ? ? ? ? ? ? ? ? ? ? ?  ?  ? ? ? ? ? ? ?Anesthesia Physical ? ?Anesthesia Plan ? ?ASA: 3 ? ?Anesthesia Plan: General  ? ?Post-op Pain Management: Regional block*  ? ?Induction: Intravenous ? ?PONV Risk Score and Plan: 2 and Ondansetron and Dexamethasone ? ?Airway Management Planned:  ? ?Additional Equipment:  ? ?Intra-op Plan:  ? ?Post-operative  Plan:  ? ?Informed Consent:  ? ?Plan Discussed with: CRNA and Anesthesiologist ? ?Anesthesia Plan Comments:   ? ? ? ? ? ?Anesthesia Quick Evaluation ? ?

## 2021-08-25 NOTE — ED Triage Notes (Signed)
Pt via POV from [facility] Day Surgery. Pt endorses SI, pt states "forever" and that she used to have dreams where she was on top of the building and jumped off. Denies any real plan. Pt states she has a hx of PTSD, denies being on medications but states she has seen a counselor in the past. Denies HI. States that "I shouldn't have told them that' Pt is A&OX4 and cooperative.  ?

## 2021-08-25 NOTE — ED Notes (Signed)
Bag 1/1 include:  ?1 pair of black shoes  ?1 pair of multi colored socks  ?1 tan cardigan  ?1 pair of sunglasses  ?1 black phone  ?1 pink shirt  ?1 tan bra ? ?

## 2021-08-25 NOTE — ED Notes (Signed)
Same day surgery called back. They will come to ED and escort pt back to their unit. Pt will miss her surgery that should have happened today. Pt was provided belongings and is getting dressed now. ?Paper discharge consent signed. ?

## 2021-08-25 NOTE — ED Notes (Signed)
Per TTS, pt has been psych cleared and can go back to same day surgery for procedure. ?

## 2021-08-25 NOTE — ED Notes (Signed)
Provided pt belongings bag to pt. Pt will go back to same day surgery, this RN waiting to hear back from them. ?

## 2021-09-09 ENCOUNTER — Ambulatory Visit: Payer: Self-pay | Admitting: Gerontology

## 2021-09-09 ENCOUNTER — Encounter: Payer: Self-pay | Admitting: Gerontology

## 2021-09-09 VITALS — BP 149/84 | HR 70 | Temp 98.1°F | Ht 63.0 in | Wt 271.4 lb

## 2021-09-09 DIAGNOSIS — N186 End stage renal disease: Secondary | ICD-10-CM

## 2021-09-09 LAB — POCT GLYCOSYLATED HEMOGLOBIN (HGB A1C): Hemoglobin A1C: 8.2 % — AB (ref 4.0–5.6)

## 2021-09-09 LAB — GLUCOSE, POCT (MANUAL RESULT ENTRY): POC Glucose: 324 mg/dl — AB (ref 70–99)

## 2021-09-09 NOTE — Progress Notes (Signed)
? ?Established Patient Office Visit ? ?Subjective   ?Patient ID: [name], female    DOB: 1975/08/16  Age: 46 y.o. MRN: [id number] ? ?Chief Complaint  ?Patient presents with  ? Follow-up  ?  Htn/diabetes f/u. 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 Thinks her niece is willing to donate to her.  ? ? ?HPI ? ?[name]   is a 46 year old female who history of Asthma,T2 Diabetes, Hypertension, Renal disorder on hemodialysis presents for follow up visit. She was seen at the ED on 08/25/21 for  suicidal ideation, she states that her mood is good, denies suicidal nor homicidal ideation and declines mental health referral. Currently, she wants to know if she can have kidney transplant and her niece in [place] wants to donate a kidney to her. She was advised to follow up Nephrologist. Her HgbA1c done during visit and it increased from 7.9% to 8.2%. She checks her blood glucose daily and her readings are usually less than 140 mg/dl. She states that she takes 10 units of Basaglar instead of 14 units because she doesn't want her fasting blood glucose to drop. She denies hypo/hyperglycemic symptoms, peripheral neuropathy and performs daily foot checks. Her blood glucose was 324 mg/dl during visit, states that she just had lunch. Her left upper AV fistula has trill and bruit, but not functional and uses right upper chest permcath for dialysis. Overall, she states that she's doing well and offers no further complaint. ? ? ?Review of Systems  ?Constitutional: Negative.   ?Respiratory: Negative.    ?Cardiovascular: Negative.   ?Neurological: Negative.   ?Psychiatric/Behavioral: Negative.    ? ?  ?Objective:  ?  ? ?BP (!) 149/84 (BP Location: Left Arm, Patient Position: Sitting, Cuff Size: Normal)   Pulse 70   Temp 98.1 ?F (36.7 ?C) (Oral)   Ht 5\' 3"  (1.6 m)   Wt 271 lb 6.4 oz (123.1 kg)   LMP 08/17/2021 (Exact Date)   SpO2 99%   BMI 48.08 kg/m?  ?BP Readings from Last 3  Encounters:  ?09/09/21 (!) 149/84  ?08/25/21 (!) 156/81  ?08/25/21 (!) 170/89  ? ?Wt Readings from Last 3 Encounters:  ?09/09/21 271 lb 6.4 oz (123.1 kg)  ?08/25/21 266 lb 12.1 oz (121 kg)  ?08/25/21 266 lb 12.1 oz (121 kg)  ? ? Encouraged weight loss ? ?Physical Exam ?HENT:  ?   Head: Normocephalic and atraumatic.  ?   Mouth/Throat:  ?   Mouth: Mucous membranes are moist.  ?Eyes:  ?   Extraocular Movements: Extraocular movements intact.  ?   Conjunctiva/sclera: Conjunctivae normal.  ?   Pupils: Pupils are equal, round, and reactive to light.  ?Cardiovascular:  ?   Rate and Rhythm: Normal rate and regular rhythm.  ?   Pulses: Normal pulses.  ?   Heart sounds: Normal heart sounds.  ?Pulmonary:  ?   Effort: Pulmonary effort is normal.  ?   Breath sounds: Normal breath sounds.  ?Neurological:  ?   General: No focal deficit present.  ?   Mental Status: She is alert and oriented to person, place, and time. Mental status is at baseline.  ?Psychiatric:     ?   Mood and Affect: Mood normal.     ?   Behavior: Behavior normal.     ?   Thought Content: Thought content normal.     ?   Judgment: Judgment normal.  ? ? ? ?Results for orders  placed or performed in visit on 09/09/21  ?POCT Glucose (CBG)  ?Result Value Ref Range  ? POC Glucose 324 (A) 70 - 99 mg/dl  ?POCT HgB A1C  ?Result Value Ref Range  ? Hemoglobin A1C 8.2 (A) 4.0 - 5.6 %  ? HbA1c POC (<> result, manual entry)    ? HbA1c, POC (prediabetic range)    ? HbA1c, POC (controlled diabetic range)    ? ? ?Last CBC ?Lab Results  ?Component Value Date  ? WBC 7.0 08/25/2021  ? HGB 9.8 (L) 08/25/2021  ? HCT 29.6 (L) 08/25/2021  ? MCV 92.2 08/25/2021  ? MCH 30.5 08/25/2021  ? RDW 12.9 08/25/2021  ? PLT 315 08/25/2021  ? ?Last metabolic panel ?Lab Results  ?Component Value Date  ? GLUCOSE 156 (H) 08/25/2021  ? NA 135 08/25/2021  ? K 4.4 08/25/2021  ? CL 97 (L) 08/25/2021  ? CO2 26 08/25/2021  ? BUN 37 (H) 08/25/2021  ? CREATININE 6.98 (H) 08/25/2021  ? GFRNONAA 7 (L) 08/25/2021   ? CALCIUM 7.9 (L) 08/25/2021  ? PHOS 3.8 07/31/2021  ? PROT 8.0 08/25/2021  ? ALBUMIN 3.8 08/25/2021  ? BILITOT 0.7 08/25/2021  ? ALKPHOS 123 08/25/2021  ? AST 17 08/25/2021  ? ALT 16 08/25/2021  ? ANIONGAP 12 08/25/2021  ? ?Last lipids ?Lab Results  ?Component Value Date  ? CHOL 183 03/10/2021  ? HDL 46 03/10/2021  ? [place] 109 (H) 03/10/2021  ? TRIG 162 (H) 03/10/2021  ? CHOLHDL 4.0 03/10/2021  ? ?Last hemoglobin A1c ?Lab Results  ?Component Value Date  ? HGBA1C 8.2 (A) 09/09/2021  ? ?Last thyroid functions ?Lab Results  ?Component Value Date  ? TSH 3.921 08/29/2020  ? ?  ? ?The 10-year ASCVD risk score (Arnett DK, et al., 2019) is: 3.5% ? ?  ?Assessment & Plan:  ? ?1. Type 2 diabetes mellitus with chronic kidney disease on chronic dialysis, unspecified whether long term insulin use ([place]) ?- Her diabetes is not improving, her HgbA1c was 8.2%, she was encouraged to start taking 14 units of Basaglar daily, check fasting blood glucose at least daily, record and bring log to follow up appointment. She was encouraged to continue on low carb/non concentrated sweet diet and exercise as tolerated. She was advised to notify her Nephrologist with regards to Kidney transplant. ?- POCT HgB A1C; Future ?- POCT Glucose (CBG); Future ?- POCT Glucose (CBG) ?- POCT HgB A1C ? ? ?Return in about 3 months (around 12/09/2021), or if symptoms worsen or fail to improve.  ? ? ?Rileigh Kawashima Jerold Coombe, NP ? ?

## 2021-09-09 NOTE — Patient Instructions (Addendum)
Carbohydrate Counting for Diabetes Mellitus, Adult ?Carbohydrate counting is a method of keeping track of how many carbohydrates you eat. Eating carbohydrates increases the amount of sugar (glucose) in the blood. Counting how many carbohydrates you eat improves how well you manage your blood glucose. This, in turn, helps you manage your diabetes. ?Carbohydrates are measured in grams (g) per serving. It is important to know how many carbohydrates (in grams or by serving size) you can have in each meal. This is different for every person. A dietitian can help you make a meal plan and calculate how many carbohydrates you should have at each meal and snack. ?What foods contain carbohydrates? ?Carbohydrates are found in the following foods: ?Grains, such as breads and cereals. ?Dried beans and soy products. ?Starchy vegetables, such as potatoes, peas, and corn. ?Fruit and fruit juices. ?Milk and yogurt. ?Sweets and snack foods, such as cake, cookies, candy, chips, and soft drinks. ?How do I count carbohydrates in foods? ?There are two ways to count carbohydrates in food. You can read food labels or learn standard serving sizes of foods. You can use either of these methods or a combination of both. ?Using the Nutrition Facts label ?The Nutrition Facts list is included on the labels of almost all packaged foods and beverages in the [place]. It includes: ?The serving size. ?Information about nutrients in each serving, including the grams of carbohydrate per serving. ?To use the Nutrition Facts, decide how many servings you will have. Then, multiply the number of servings by the number of carbohydrates per serving. The resulting number is the total grams of carbohydrates that you will be having. ?Learning the standard serving sizes of foods ?When you eat carbohydrate foods that are not packaged or do not include Nutrition Facts on the label, you need to measure the servings in order to count the grams of  carbohydrates. ?Measure the foods that you will eat with a food scale or measuring cup, if needed. ?Decide how many standard-size servings you will eat. ?Multiply the number of servings by 15. For foods that contain carbohydrates, one serving equals 15 g of carbohydrates. ?For example, if you eat 2 cups or 10 oz (300 g) of strawberries, you will have eaten 2 servings and 30 g of carbohydrates (2 servings x 15 g = 30 g). ?For foods that have more than one food mixed, such as soups and casseroles, you must count the carbohydrates in each food that is included. ?The following list contains standard serving sizes of common carbohydrate-rich foods. Each of these servings has about 15 g of carbohydrates: ?1 slice of bread. ?1 six-inch (15 cm) tortilla. ?? cup or 2 oz (53 g) cooked rice or pasta. ?? cup or 3 oz (85 g) cooked or canned, drained and rinsed beans or lentils. ?? cup or 3 oz (85 g) starchy vegetable, such as peas, corn, or squash. ?? cup or 4 oz (120 g) hot cereal. ?? cup or 3 oz (85 g) boiled or mashed potatoes, or ? or 3 oz (85 g) of a large baked potato. ?? cup or 4 fl oz (118 mL) fruit juice. ?1 cup or 8 fl oz (237 mL) milk. ?1 small or 4 oz (106 g) apple. ?? or 2 oz (63 g) of a medium banana. ?1 cup or 5 oz (150 g) strawberries. ?3 cups or 1 oz (28.3 g) popped popcorn. ?What is an example of carbohydrate counting? ?To calculate the grams of carbohydrates in this sample meal, follow the steps   shown below. ?Sample meal ?3 oz (85 g) chicken breast. ?? cup or 4 oz (106 g) brown rice. ?? cup or 3 oz (85 g) corn. ?1 cup or 8 fl oz (237 mL) milk. ?1 cup or 5 oz (150 g) strawberries with sugar-free whipped topping. ?Carbohydrate calculation ?Identify the foods that contain carbohydrates: ?Rice. ?Corn. ?Milk. ?Strawberries. ?Calculate how many servings you have of each food: ?2 servings rice. ?1 serving corn. ?1 serving milk. ?1 serving strawberries. ?Multiply each number of servings by 15 g: ?2 servings rice x 15  g = 30 g. ?1 serving corn x 15 g = 15 g. ?1 serving milk x 15 g = 15 g. ?1 serving strawberries x 15 g = 15 g. ?Add together all of the amounts to find the total grams of carbohydrates eaten: ?30 g + 15 g + 15 g + 15 g = 75 g of carbohydrates total. ?What are tips for following this plan? ?Shopping ?Develop a meal plan and then make a shopping list. ?Buy fresh and frozen vegetables, fresh and frozen fruit, dairy, eggs, beans, lentils, and whole grains. ?Look at food labels. Choose foods that have more fiber and less sugar. ?Avoid processed foods and foods with added sugars. ?Meal planning ?Aim to have the same number of grams of carbohydrates at each meal and for each snack time. ?Plan to have regular, balanced meals and snacks. ?Where to find more information ?American Diabetes Association: diabetes.org ?Centers for Disease Control and Prevention: cdc.gov ?Academy of Nutrition and Dietetics: eatright.org ?Association of Diabetes Care & Education Specialists: diabeteseducator.org ?Summary ?Carbohydrate counting is a method of keeping track of how many carbohydrates you eat. ?Eating carbohydrates increases the amount of sugar (glucose) in your blood. ?Counting how many carbohydrates you eat improves how well you manage your blood glucose. This helps you manage your diabetes. ?A dietitian can help you make a meal plan and calculate how many carbohydrates you should have at each meal and snack. ?This information is not intended to replace advice given to you by your health care provider. Make sure you discuss any questions you have with your health care provider. ?Document Revised: 12/04/2019 Document Reviewed: 12/04/2019 ?Elsevier Patient Education ? 2023 [employer] ?DASH Eating Plan ?DASH stands for Dietary Approaches to Stop Hypertension. The DASH eating plan is a healthy eating plan that has been shown to: ?Reduce high blood pressure (hypertension). ?Reduce your risk for type 2 diabetes, heart disease, and  stroke. ?Help with weight loss. ?What are tips for following this plan? ?Reading food labels ?Check food labels for the amount of salt (sodium) per serving. Choose foods with less than 5 percent of the Daily Value of sodium. Generally, foods with less than 300 milligrams (mg) of sodium per serving fit into this eating plan. ?To find whole grains, look for the word "whole" as the first word in the ingredient list. ?Shopping ?Buy products labeled as "low-sodium" or "no salt added." ?Buy fresh foods. Avoid canned foods and pre-made or frozen meals. ?Cooking ?Avoid adding salt when cooking. Use salt-free seasonings or herbs instead of table salt or sea salt. Check with your health care provider or pharmacist before using salt substitutes. ?Do not fry foods. Cook foods using healthy methods such as baking, boiling, grilling, roasting, and broiling instead. ?Cook with heart-healthy oils, such as olive, canola, avocado, soybean, or sunflower oil. ?Meal planning ? ?Eat a balanced diet that includes: ?4 or more servings of fruits and 4 or more servings of vegetables each day.   Try to fill one-half of your plate with fruits and vegetables. ?6-8 servings of whole grains each day. ?Less than 6 oz (170 g) of lean meat, poultry, or fish each day. A 3-oz (85-g) serving of meat is about the same size as a deck of cards. One egg equals 1 oz (28 g). ?2-3 servings of low-fat dairy each day. One serving is 1 cup (237 mL). ?1 serving of nuts, seeds, or beans 5 times each week. ?2-3 servings of heart-healthy fats. Healthy fats called omega-3 fatty acids are found in foods such as walnuts, flaxseeds, fortified milks, and eggs. These fats are also found in cold-water fish, such as sardines, salmon, and mackerel. ?Limit how much you eat of: ?Canned or prepackaged foods. ?Food that is high in trans fat, such as some fried foods. ?Food that is high in saturated fat, such as fatty meat. ?Desserts and other sweets, sugary drinks, and other foods  with added sugar. ?Full-fat dairy products. ?Do not salt foods before eating. ?Do not eat more than 4 egg yolks a week. ?Try to eat at least 2 vegetarian meals a week. ?Eat more home-cooked food and less restaurant, bu

## 2021-09-14 ENCOUNTER — Ambulatory Visit: Payer: Self-pay | Admitting: Gerontology

## 2021-09-16 ENCOUNTER — Telehealth (INDEPENDENT_AMBULATORY_CARE_PROVIDER_SITE_OTHER): Payer: Self-pay | Admitting: Vascular Surgery

## 2021-09-16 ENCOUNTER — Other Ambulatory Visit: Payer: Self-pay

## 2021-09-23 ENCOUNTER — Other Ambulatory Visit: Payer: Self-pay

## 2021-09-24 ENCOUNTER — Other Ambulatory Visit: Payer: Self-pay

## 2021-09-30 ENCOUNTER — Ambulatory Visit (INDEPENDENT_AMBULATORY_CARE_PROVIDER_SITE_OTHER): Payer: Self-pay | Admitting: Vascular Surgery

## 2021-10-14 DIAGNOSIS — N186 End stage renal disease: Secondary | ICD-10-CM | POA: Diagnosis not present

## 2021-10-14 DIAGNOSIS — T82898A Other specified complication of vascular prosthetic devices, implants and grafts, initial encounter: Secondary | ICD-10-CM | POA: Diagnosis not present

## 2021-10-15 ENCOUNTER — Other Ambulatory Visit: Payer: Self-pay

## 2021-10-21 DIAGNOSIS — I120 Hypertensive chronic kidney disease with stage 5 chronic kidney disease or end stage renal disease: Secondary | ICD-10-CM | POA: Diagnosis not present

## 2021-10-21 DIAGNOSIS — D631 Anemia in chronic kidney disease: Secondary | ICD-10-CM | POA: Diagnosis not present

## 2021-10-21 DIAGNOSIS — Z79899 Other long term (current) drug therapy: Secondary | ICD-10-CM | POA: Diagnosis not present

## 2021-10-21 DIAGNOSIS — Z992 Dependence on renal dialysis: Secondary | ICD-10-CM | POA: Diagnosis not present

## 2021-10-21 DIAGNOSIS — K5903 Drug induced constipation: Secondary | ICD-10-CM | POA: Diagnosis not present

## 2021-10-21 DIAGNOSIS — N186 End stage renal disease: Secondary | ICD-10-CM | POA: Diagnosis not present

## 2021-10-21 DIAGNOSIS — E1122 Type 2 diabetes mellitus with diabetic chronic kidney disease: Secondary | ICD-10-CM | POA: Diagnosis not present

## 2021-10-21 DIAGNOSIS — Z794 Long term (current) use of insulin: Secondary | ICD-10-CM | POA: Diagnosis not present

## 2021-10-21 DIAGNOSIS — K5900 Constipation, unspecified: Secondary | ICD-10-CM | POA: Diagnosis not present

## 2021-12-09 ENCOUNTER — Encounter: Payer: Self-pay | Admitting: Gerontology

## 2021-12-09 ENCOUNTER — Other Ambulatory Visit: Payer: Self-pay

## 2021-12-09 ENCOUNTER — Ambulatory Visit: Payer: Self-pay | Admitting: Gerontology

## 2021-12-09 VITALS — BP 135/84 | HR 69 | Wt 269.8 lb

## 2021-12-09 DIAGNOSIS — N186 End stage renal disease: Secondary | ICD-10-CM

## 2021-12-09 DIAGNOSIS — E1122 Type 2 diabetes mellitus with diabetic chronic kidney disease: Secondary | ICD-10-CM

## 2021-12-09 DIAGNOSIS — I10 Essential (primary) hypertension: Secondary | ICD-10-CM

## 2021-12-09 LAB — POCT GLYCOSYLATED HEMOGLOBIN (HGB A1C): Hemoglobin A1C: 8.8 % — AB (ref 4.0–5.6)

## 2021-12-09 LAB — GLUCOSE, POCT (MANUAL RESULT ENTRY): POC Glucose: 166 mg/dl — AB (ref 70–99)

## 2021-12-09 MED ORDER — BASAGLAR KWIKPEN 100 UNIT/ML ~~LOC~~ SOPN: 14.0000 [IU] | PEN_INJECTOR | Freq: Every day | SUBCUTANEOUS | 3 refills | Status: DC

## 2021-12-09 MED ORDER — BASAGLAR KWIKPEN 100 UNIT/ML ~~LOC~~ SOPN: 17.0000 [IU] | PEN_INJECTOR | Freq: Every day | SUBCUTANEOUS | 3 refills | Status: DC

## 2021-12-09 MED ORDER — AMLODIPINE BESYLATE 10 MG PO TABS
10.0000 mg | ORAL_TABLET | Freq: Every day | ORAL | 0 refills | Status: DC
  Filled 2021-12-09: qty 30, 30d supply, fill #0

## 2021-12-09 MED ORDER — AMLODIPINE BESYLATE 10 MG PO TABS: 10.0000 mg | ORAL_TABLET | Freq: Every day | ORAL | 0 refills | Status: DC

## 2021-12-09 NOTE — Patient Instructions (Signed)
DASH Eating Plan DASH stands for Dietary Approaches to Stop Hypertension. The DASH eating plan is a healthy eating plan that has been shown to: Reduce high blood pressure (hypertension). Reduce your risk for type 2 diabetes, heart disease, and stroke. Help with weight loss. What are tips for following this plan? Reading food labels Check food labels for the amount of salt (sodium) per serving. Choose foods with less than 5 percent of the Daily Value of sodium. Generally, foods with less than 300 milligrams (mg) of sodium per serving fit into this eating plan. To find whole grains, look for the word "whole" as the first word in the ingredient list. Shopping Buy products labeled as "low-sodium" or "no salt added." Buy fresh foods. Avoid canned foods and pre-made or frozen meals. Cooking Avoid adding salt when cooking. Use salt-free seasonings or herbs instead of table salt or sea salt. Check with your health care provider or pharmacist before using salt substitutes. Do not fry foods. Cook foods using healthy methods such as baking, boiling, grilling, roasting, and broiling instead. Cook with heart-healthy oils, such as olive, canola, avocado, soybean, or sunflower oil. Meal planning  Eat a balanced diet that includes: 4 or more servings of fruits and 4 or more servings of vegetables each day. Try to fill one-half of your plate with fruits and vegetables. 6-8 servings of whole grains each day. Less than 6 oz (170 g) of lean meat, poultry, or fish each day. A 3-oz (85-g) serving of meat is about the same size as a deck of cards. One egg equals 1 oz (28 g). 2-3 servings of low-fat dairy each day. One serving is 1 cup (237 mL). 1 serving of nuts, seeds, or beans 5 times each week. 2-3 servings of heart-healthy fats. Healthy fats called omega-3 fatty acids are found in foods such as walnuts, flaxseeds, fortified milks, and eggs. These fats are also found in cold-water fish, such as sardines, salmon,  and mackerel. Limit how much you eat of: Canned or prepackaged foods. Food that is high in trans fat, such as some fried foods. Food that is high in saturated fat, such as fatty meat. Desserts and other sweets, sugary drinks, and other foods with added sugar. Full-fat dairy products. Do not salt foods before eating. Do not eat more than 4 egg yolks a week. Try to eat at least 2 vegetarian meals a week. Eat more home-cooked food and less restaurant, buffet, and fast food. Lifestyle When eating at a restaurant, ask that your food be prepared with less salt or no salt, if possible. If you drink alcohol: Limit how much you use to: 0-1 drink a day for women who are not pregnant. 0-2 drinks a day for men. Be aware of how much alcohol is in your drink. In the [place] one drink equals one 12 oz bottle of beer (355 mL), one 5 oz glass of wine (148 mL), or one 1 oz glass of hard liquor (44 mL). General information Avoid eating more than 2,300 mg of salt a day. If you have hypertension, you may need to reduce your sodium intake to 1,500 mg a day. Work with your health care provider to maintain a healthy body weight or to lose weight. Ask what an ideal weight is for you. Get at least 30 minutes of exercise that causes your heart to beat faster (aerobic exercise) most days of the week. Activities may include walking, swimming, or biking. Work with your health care provider or dietitian to   adjust your eating plan to your individual calorie needs. What foods should I eat? Fruits All fresh, dried, or frozen fruit. Canned fruit in natural juice (without added sugar). Vegetables Fresh or frozen vegetables (raw, steamed, roasted, or grilled). Low-sodium or reduced-sodium tomato and vegetable juice. Low-sodium or reduced-sodium tomato sauce and tomato paste. Low-sodium or reduced-sodium canned vegetables. Grains Whole-grain or whole-wheat bread. Whole-grain or whole-wheat pasta. Brown rice. Oatmeal. Quinoa.  Bulgur. Whole-grain and low-sodium cereals. Pita bread. Low-fat, low-sodium crackers. Whole-wheat flour tortillas. Meats and other proteins Skinless chicken or turkey. Ground chicken or turkey. Pork with fat trimmed off. Fish and seafood. Egg whites. Dried beans, peas, or lentils. Unsalted nuts, nut butters, and seeds. Unsalted canned beans. Lean cuts of beef with fat trimmed off. Low-sodium, lean precooked or cured meat, such as sausages or meat loaves. Dairy Low-fat (1%) or fat-free (skim) milk. Reduced-fat, low-fat, or fat-free cheeses. Nonfat, low-sodium ricotta or cottage cheese. Low-fat or nonfat yogurt. Low-fat, low-sodium cheese. Fats and oils Soft margarine without trans fats. Vegetable oil. Reduced-fat, low-fat, or light mayonnaise and salad dressings (reduced-sodium). Canola, safflower, olive, avocado, soybean, and sunflower oils. Avocado. Seasonings and condiments Herbs. Spices. Seasoning mixes without salt. Other foods Unsalted popcorn and pretzels. Fat-free sweets. The items listed above may not be a complete list of foods and beverages you can eat. Contact a dietitian for more information. What foods should I avoid? Fruits Canned fruit in a light or heavy syrup. Fried fruit. Fruit in cream or butter sauce. Vegetables Creamed or fried vegetables. Vegetables in a cheese sauce. Regular canned vegetables (not low-sodium or reduced-sodium). Regular canned tomato sauce and paste (not low-sodium or reduced-sodium). Regular tomato and vegetable juice (not low-sodium or reduced-sodium). Pickles. Olives. Grains Baked goods made with fat, such as croissants, muffins, or some breads. Dry pasta or rice meal packs. Meats and other proteins Fatty cuts of meat. Ribs. Fried meat. Bacon. Bologna, salami, and other precooked or cured meats, such as sausages or meat loaves. Fat from the back of a pig (fatback). Bratwurst. Salted nuts and seeds. Canned beans with added salt. Canned or smoked fish.  Whole eggs or egg yolks. Chicken or turkey with skin. Dairy Whole or 2% milk, cream, and half-and-half. Whole or full-fat cream cheese. Whole-fat or sweetened yogurt. Full-fat cheese. Nondairy creamers. Whipped toppings. Processed cheese and cheese spreads. Fats and oils Butter. Stick margarine. Lard. Shortening. Ghee. Bacon fat. Tropical oils, such as coconut, palm kernel, or palm oil. Seasonings and condiments Onion salt, garlic salt, seasoned salt, table salt, and sea salt. Worcestershire sauce. Tartar sauce. Barbecue sauce. Teriyaki sauce. Soy sauce, including reduced-sodium. Steak sauce. Canned and packaged gravies. Fish sauce. Oyster sauce. Cocktail sauce. Store-bought horseradish. Ketchup. Mustard. Meat flavorings and tenderizers. Bouillon cubes. Hot sauces. Pre-made or packaged marinades. Pre-made or packaged taco seasonings. Relishes. Regular salad dressings. Other foods Salted popcorn and pretzels. The items listed above may not be a complete list of foods and beverages you should avoid. Contact a dietitian for more information. Where to find more information National Heart, Lung, and Blood Institute: www.nhlbi.nih.gov American Heart Association: www.heart.org Academy of Nutrition and Dietetics: www.eatright.org National Kidney Foundation: www.kidney.org Summary The DASH eating plan is a healthy eating plan that has been shown to reduce high blood pressure (hypertension). It may also reduce your risk for type 2 diabetes, heart disease, and stroke. When on the DASH eating plan, aim to eat more fresh fruits and vegetables, whole grains, lean proteins, low-fat dairy, and heart-healthy fats. With the DASH   eating plan, you should limit salt (sodium) intake to 2,300 mg a day. If you have hypertension, you may need to reduce your sodium intake to 1,500 mg a day. Work with your health care provider or dietitian to adjust your eating plan to your individual calorie needs. This information is not  intended to replace advice given to you by your health care provider. Make sure you discuss any questions you have with your health care provider. Document Revised: 04/05/2019 Document Reviewed: 04/05/2019 Elsevier Patient Education  2023 Elsevier Inc. Carbohydrate Counting for Diabetes Mellitus, Adult Carbohydrate counting is a method of keeping track of how many carbohydrates you eat. Eating carbohydrates increases the amount of sugar (glucose) in the blood. Counting how many carbohydrates you eat improves how well you manage your blood glucose. This, in turn, helps you manage your diabetes. Carbohydrates are measured in grams (g) per serving. It is important to know how many carbohydrates (in grams or by serving size) you can have in each meal. This is different for every person. A dietitian can help you make a meal plan and calculate how many carbohydrates you should have at each meal and snack. What foods contain carbohydrates? Carbohydrates are found in the following foods: Grains, such as breads and cereals. Dried beans and soy products. Starchy vegetables, such as potatoes, peas, and corn. Fruit and fruit juices. Milk and yogurt. Sweets and snack foods, such as cake, cookies, candy, chips, and soft drinks. How do I count carbohydrates in foods? There are two ways to count carbohydrates in food. You can read food labels or learn standard serving sizes of foods. You can use either of these methods or a combination of both. Using the Nutrition Facts label The Nutrition Facts list is included on the labels of almost all packaged foods and beverages in the [place]. It includes: The serving size. Information about nutrients in each serving, including the grams of carbohydrate per serving. To use the Nutrition Facts, decide how many servings you will have. Then, multiply the number of servings by the number of carbohydrates per serving. The resulting number is the total grams of  carbohydrates that you will be having. Learning the standard serving sizes of foods When you eat carbohydrate foods that are not packaged or do not include Nutrition Facts on the label, you need to measure the servings in order to count the grams of carbohydrates. Measure the foods that you will eat with a food scale or measuring cup, if needed. Decide how many standard-size servings you will eat. Multiply the number of servings by 15. For foods that contain carbohydrates, one serving equals 15 g of carbohydrates. For example, if you eat 2 cups or 10 oz (300 g) of strawberries, you will have eaten 2 servings and 30 g of carbohydrates (2 servings x 15 g = 30 g). For foods that have more than one food mixed, such as soups and casseroles, you must count the carbohydrates in each food that is included. The following list contains standard serving sizes of common carbohydrate-rich foods. Each of these servings has about 15 g of carbohydrates: 1 slice of bread. 1 six-inch (15 cm) tortilla. ? cup or 2 oz (53 g) cooked rice or pasta.  cup or 3 oz (85 g) cooked or canned, drained and rinsed beans or lentils.  cup or 3 oz (85 g) starchy vegetable, such as peas, corn, or squash.  cup or 4 oz (120 g) hot cereal.  cup or 3 oz (85   g) boiled or mashed potatoes, or  or 3 oz (85 g) of a large baked potato.  cup or 4 fl oz (118 mL) fruit juice. 1 cup or 8 fl oz (237 mL) milk. 1 small or 4 oz (106 g) apple.  or 2 oz (63 g) of a medium banana. 1 cup or 5 oz (150 g) strawberries. 3 cups or 1 oz (28.3 g) popped popcorn. What is an example of carbohydrate counting? To calculate the grams of carbohydrates in this sample meal, follow the steps shown below. Sample meal 3 oz (85 g) chicken breast. ? cup or 4 oz (106 g) brown rice.  cup or 3 oz (85 g) corn. 1 cup or 8 fl oz (237 mL) milk. 1 cup or 5 oz (150 g) strawberries with sugar-free whipped topping. Carbohydrate calculation Identify the foods that  contain carbohydrates: Rice. Corn. Milk. Strawberries. Calculate how many servings you have of each food: 2 servings rice. 1 serving corn. 1 serving milk. 1 serving strawberries. Multiply each number of servings by 15 g: 2 servings rice x 15 g = 30 g. 1 serving corn x 15 g = 15 g. 1 serving milk x 15 g = 15 g. 1 serving strawberries x 15 g = 15 g. Add together all of the amounts to find the total grams of carbohydrates eaten: 30 g + 15 g + 15 g + 15 g = 75 g of carbohydrates total. What are tips for following this plan? Shopping Develop a meal plan and then make a shopping list. Buy fresh and frozen vegetables, fresh and frozen fruit, dairy, eggs, beans, lentils, and whole grains. Look at food labels. Choose foods that have more fiber and less sugar. Avoid processed foods and foods with added sugars. Meal planning Aim to have the same number of grams of carbohydrates at each meal and for each snack time. Plan to have regular, balanced meals and snacks. Where to find more information American Diabetes Association: diabetes.org Centers for Disease Control and Prevention: cdc.gov Academy of Nutrition and Dietetics: eatright.org Association of Diabetes Care & Education Specialists: diabeteseducator.org Summary Carbohydrate counting is a method of keeping track of how many carbohydrates you eat. Eating carbohydrates increases the amount of sugar (glucose) in your blood. Counting how many carbohydrates you eat improves how well you manage your blood glucose. This helps you manage your diabetes. A dietitian can help you make a meal plan and calculate how many carbohydrates you should have at each meal and snack. This information is not intended to replace advice given to you by your health care provider. Make sure you discuss any questions you have with your health care provider. Document Revised: 12/04/2019 Document Reviewed: 12/04/2019 Elsevier Patient Education  2023 Elsevier  Inc.  

## 2021-12-09 NOTE — Progress Notes (Signed)
Established Patient Office Visit  Subjective   Patient ID: [name], female    DOB: 08/31/1975  Age: 46 y.o. MRN: [id number]  No chief complaint on file.   HPI [name]   is a 46 year old female who history of Asthma,T2 Diabetes, Hypertension, Renal disorder on hemodialysis presents for follow up visit and lab review. Thrill and bruit present o left upper arm AV fistula, and she is compliant with her hemodialysis. Her HgbA1c checked during visit increased from 8.2% to 8.8%.  She checks her blood glucose bid, didn't check this morning, and her blood glucose was 166 mg per DL.  She denies hypo-/hyperglycemic symptoms, peripheral neuropathy and performs daily foot check.  She states that she is compliant with her medications, denies side effects and continues to make healthy lifestyle changes.  This will be her last visit because she has active healthcare insurance.  Overall, she states that she is doing well and offers no further complaint.    Review of Systems  Constitutional: Negative.   Eyes: Negative.   Respiratory: Negative.    Cardiovascular: Negative.   Skin: Negative.   Neurological: Negative.   Endo/Heme/Allergies: Negative.   Psychiatric/Behavioral: Negative.        Objective:     There were no vitals taken for this visit. BP Readings from Last 3 Encounters:  12/09/21 135/84  09/09/21 (!) 149/84  08/25/21 (!) 156/81   Wt Readings from Last 3 Encounters:  12/09/21 269 lb 12.8 oz (122.4 kg)  09/09/21 271 lb 6.4 oz (123.1 kg)  08/25/21 266 lb 12.1 oz (121 kg)      Physical Exam HENT:     Head: Normocephalic.     Mouth/Throat:     Mouth: Mucous membranes are moist.  Eyes:     Extraocular Movements: Extraocular movements intact.     Conjunctiva/sclera: Conjunctivae normal.     Pupils: Pupils are equal, round, and reactive to light.  Cardiovascular:     Rate and Rhythm: Normal rate and regular rhythm.     Pulses: Normal pulses.     Heart  sounds: Normal heart sounds.  Pulmonary:     Effort: Pulmonary effort is normal.     Breath sounds: Normal breath sounds.  Skin:    General: Skin is warm.  Neurological:     General: No focal deficit present.     Mental Status: She is alert and oriented to person, place, and time. Mental status is at baseline.  Psychiatric:        Mood and Affect: Mood normal.        Behavior: Behavior normal.        Thought Content: Thought content normal.        Judgment: Judgment normal.      No results found for any visits on 12/09/21.  Last CBC Lab Results  Component Value Date   WBC 7.0 08/25/2021   HGB 9.8 (L) 08/25/2021   HCT 29.6 (L) 08/25/2021   MCV 92.2 08/25/2021   MCH 30.5 08/25/2021   RDW 12.9 08/25/2021   PLT 315 01/74/9449   Last metabolic panel Lab Results  Component Value Date   GLUCOSE 156 (H) 08/25/2021   NA 135 08/25/2021   K 4.4 08/25/2021   CL 97 (L) 08/25/2021   CO2 26 08/25/2021   BUN 37 (H) 08/25/2021   CREATININE 6.98 (H) 08/25/2021   GFRNONAA 7 (L) 08/25/2021   CALCIUM 7.9 (L) 08/25/2021   PHOS 3.8 07/31/2021  PROT 8.0 08/25/2021   ALBUMIN 3.8 08/25/2021   BILITOT 0.7 08/25/2021   ALKPHOS 123 08/25/2021   AST 17 08/25/2021   ALT 16 08/25/2021   ANIONGAP 12 08/25/2021   Last lipids Lab Results  Component Value Date   CHOL 183 03/10/2021   HDL 46 03/10/2021   LDLCALC 109 (H) 03/10/2021   TRIG 162 (H) 03/10/2021   CHOLHDL 4.0 03/10/2021   Last hemoglobin A1c Lab Results  Component Value Date   HGBA1C 8.8 (A) 12/09/2021      The 10-year ASCVD risk score (Arnett DK, et al., 2019) is: 4%    Assessment & Plan:   1. Essential hypertension -Her blood pressure is improving, she will continue on current medication, hemodialysis, DASH diet and exercise as tolerated. - amLODipine (NORVASC) 10 MG tablet; Take 1 tablet (10 mg total) by mouth daily.  Dispense: 90 tablet; Refill: 0  2. Type 2 diabetes mellitus with chronic kidney disease on  chronic dialysis, unspecified whether long term insulin use (HCC) -Her hemoglobin A1c was 8.8% and her goal should be less than 7%.  She was encouraged to check her blood glucose twice daily record and bring log to follow-up appointment.  Her Basaglar was increased to 17 units at bedtime.  She was advised to continue on low carbohydrate/non concentrated sweet diet and exercise as tolerated. - POCT HgB A1C; Future - POCT Glucose (CBG); Future - POCT Glucose (CBG) - POCT HgB A1C - Insulin Glargine (BASAGLAR KWIKPEN) 100 UNIT/ML; Inject 17 Units into the skin at bedtime.  Dispense: 15 mL; Refill: 3     No follow-ups on file.  No follow-up appointment because she has active health insurance.  [facility] wishes how well with her care.   Sinia Antosh Jerold Coombe, NP

## 2021-12-14 ENCOUNTER — Other Ambulatory Visit: Payer: Self-pay

## 2022-01-05 NOTE — Progress Notes (Deleted)
MRN : [id number]  [name] is a 46 y.o. ([birth year]) female who presents with chief complaint of check access.  History of Present Illness:   The patient returns to the office for followup status post intervention of the dialysis access. Following the intervention the access function has significantly improved, with better flow rates and improved KT/V. The patient has not been experiencing increased bleeding times following decannulation and the patient denies increased recirculation. The patient denies an increase in arm swelling. At the present time the patient denies hand pain.   The patient denies amaurosis fugax or recent TIA symptoms. There are no recent neurological changes noted. The patient denies claudication symptoms or rest pain symptoms. The patient denies history of DVT, PE or superficial thrombophlebitis. The patient denies recent episodes of angina or shortness of breath.    Duplex ultrasound of the AV access shows a patent access with uniform velocities.  No focal hemodynamically significant stenosis noted at stent site.  No outpatient medications have been marked as taking for the 01/06/22 encounter (Appointment) with Delana Meyer, Dolores Lory, MD.    Past Medical History:  Diagnosis Date   Anemia    Asthma    well controlled   Complication of anesthesia    "takes alot for anesthesia to work"   Concussion    h/o   COPD (chronic obstructive pulmonary disease) ([place])    Depression    Diabetes ([place])    last A1c was 5.8 on 09-04-20   Dialysis patient ([place])    T, Th, and Sat   Family history of adverse reaction to anesthesia    son-became aggressive after surgery   GERD (gastroesophageal reflux disease)    occ no meds   Headache    migraines   Hypertension    Palpitations    Pneumonia    h/o   Renal disorder    Sleep apnea     Past Surgical History:  Procedure Laterality Date   A/V FISTULAGRAM Left 06/22/2021   Procedure:  A/V FISTULAGRAM;  Surgeon: Katha Cabal, MD;  Location: [place] [facility];  Service: Cardiovascular;  Laterality: Left;   AV FISTULA PLACEMENT Left 11/20/2020   Procedure: ARTERIOVENOUS (AV) FISTULA CREATION ( BRACHIAL CEPHALIC );  Surgeon: Katha Cabal, MD;  Location: [facility];  Service: Vascular;  Laterality: Left;   CESAREAN SECTION N/A    x3   CHOLECYSTECTOMY     DIALYSIS/PERMA CATHETER INSERTION N/A 08/24/2020   Procedure: DIALYSIS/PERMA CATHETER INSERTION;  Surgeon: Algernon Huxley, MD;  Location: [place] [facility];  Service: Cardiovascular;  Laterality: N/A;   DIALYSIS/PERMA CATHETER INSERTION N/A 08/03/2021   Procedure: DIALYSIS/PERMA CATHETER INSERTION;  Surgeon: Katha Cabal, MD;  Location: [place] [facility];  Service: Cardiovascular;  Laterality: N/A;   DIALYSIS/PERMA CATHETER REMOVAL N/A 07/22/2021   Procedure: DIALYSIS/PERMA CATHETER REMOVAL;  Surgeon: Algernon Huxley, MD;  Location: [place] [facility];  Service: Cardiovascular;  Laterality: N/A;    Social History Social History   Tobacco Use   Smoking status: Never   Smokeless tobacco: Never  Vaping Use   Vaping Use: Never used  Substance Use Topics   Alcohol use: Never   Drug use: Never    Family History Family History  Problem Relation Age of Onset   Diabetes Mother    Skin cancer Mother  Heart attack Father        died at ~57 years of age   Other Sister        hit by a motorcycle   Alcohol abuse Brother    Diabetes Brother    HIV Brother     Allergies  Allergen Reactions   Statins Other (See Comments)    "redness and facial swelling"   Cholesterol Rash    Cholesterol medicine     REVIEW OF SYSTEMS (Negative unless checked)  Constitutional: [] Weight loss  [] Fever  [] Chills Cardiac: [] Chest pain   [] Chest pressure   [] Palpitations   [] Shortness of breath when laying flat   [] Shortness of breath with exertion. Vascular:  [] Pain in legs with walking   [] Pain in legs at rest   [] History of DVT   [] Phlebitis   [] Swelling in legs   [] Varicose veins   [] Non-healing ulcers Pulmonary:   [] Uses home oxygen   [] Productive cough   [] Hemoptysis   [] Wheeze  [] COPD   [] Asthma Neurologic:  [] Dizziness   [] Seizures   [] History of stroke   [] History of TIA  [] Aphasia   [] Vissual changes   [] Weakness or numbness in arm   [] Weakness or numbness in leg Musculoskeletal:   [] Joint swelling   [] Joint pain   [] Low back pain Hematologic:  [] Easy bruising  [] Easy bleeding   [] Hypercoagulable state   [] Anemic Gastrointestinal:  [] Diarrhea   [] Vomiting  [] Gastroesophageal reflux/heartburn   [] Difficulty swallowing. Genitourinary:  [x] Chronic kidney disease   [] Difficult urination  [] Frequent urination   [] Blood in urine Skin:  [] Rashes   [] Ulcers  Psychological:  [] History of anxiety   []  History of major depression.  Physical Examination  There were no vitals filed for this visit. There is no height or weight on file to calculate BMI. Gen: WD/WN, NAD Head: [place]/AT, No temporalis wasting.  Ear/Nose/Throat: Hearing grossly intact, nares w/o erythema or drainage Eyes: PER, EOMI, sclera nonicteric.  Neck: Supple, no gross masses or lesions.  No JVD.  Pulmonary:  Good air movement, no audible wheezing, no use of accessory muscles.  Cardiac: RRR, precordium non-hyperdynamic. Vascular:   *** Vessel Right Left  Radial Palpable Palpable  Brachial Palpable Palpable  Gastrointestinal: soft, non-distended. No guarding/no peritoneal signs.  Musculoskeletal: M/S 5/5 throughout.  No deformity.  Neurologic: CN 2-12 intact. Pain and light touch intact in extremities.  Symmetrical.  Speech is fluent. Motor exam as listed above. Psychiatric: Judgment intact, Mood & affect appropriate for pt's clinical situation. Dermatologic: No rashes or ulcers noted.  No changes consistent with cellulitis.   CBC Lab Results  Component Value Date   WBC 7.0 08/25/2021   HGB 9.8 (L) 08/25/2021   HCT 29.6 (L)  08/25/2021   MCV 92.2 08/25/2021   PLT 315 08/25/2021    BMET    Component Value Date/Time   NA 135 08/25/2021 1031   K 4.4 08/25/2021 1031   CL 97 (L) 08/25/2021 1031   CO2 26 08/25/2021 1031   GLUCOSE 156 (H) 08/25/2021 1031   BUN 37 (H) 08/25/2021 1031   CREATININE 6.98 (H) 08/25/2021 1031   CALCIUM 7.9 (L) 08/25/2021 1031   GFRNONAA 7 (L) 08/25/2021 1031   GFRAA 17 (L) 12/13/2019 0514   CrCl cannot be calculated (Patient's most recent lab result is older than the maximum 21 days allowed.).  COAG Lab Results  Component Value Date   INR 1.0 11/13/2020    Radiology No results found.   Assessment/Plan There are no  diagnoses linked to this encounter.   Angelica Pilar, MD  01/05/2022 7:39 AM

## 2022-01-06 ENCOUNTER — Ambulatory Visit (INDEPENDENT_AMBULATORY_CARE_PROVIDER_SITE_OTHER): Payer: MEDICAID | Admitting: Vascular Surgery

## 2022-01-06 ENCOUNTER — Encounter (INDEPENDENT_AMBULATORY_CARE_PROVIDER_SITE_OTHER): Payer: MEDICAID

## 2022-01-06 DIAGNOSIS — N186 End stage renal disease: Secondary | ICD-10-CM

## 2022-01-06 DIAGNOSIS — E781 Pure hyperglyceridemia: Secondary | ICD-10-CM

## 2022-01-06 DIAGNOSIS — I10 Essential (primary) hypertension: Secondary | ICD-10-CM

## 2022-01-06 DIAGNOSIS — E1122 Type 2 diabetes mellitus with diabetic chronic kidney disease: Secondary | ICD-10-CM

## 2022-01-19 ENCOUNTER — Telehealth (INDEPENDENT_AMBULATORY_CARE_PROVIDER_SITE_OTHER): Payer: Self-pay

## 2022-01-19 NOTE — Telephone Encounter (Signed)
Spoke with the patient and she is scheduled with Dr. Lucky Cowboy for a permcath removal on 01/27/22 with a 11:00 am arrival time to the MM. Pre-procedure instructions were discussed and will be mailed.

## 2022-01-27 ENCOUNTER — Encounter: Payer: Self-pay | Admitting: Vascular Surgery

## 2022-01-27 ENCOUNTER — Ambulatory Visit
Admission: RE | Admit: 2022-01-27 | Discharge: 2022-01-27 | Disposition: A | Discharge status: Home / Self Care | Payer: 59 | Source: Ambulatory Visit | Attending: Vascular Surgery | Admitting: Vascular Surgery

## 2022-01-27 ENCOUNTER — Encounter
Admission: RE | Disposition: A | Discharge status: Home / Self Care | Payer: Self-pay | Source: Ambulatory Visit | Attending: Vascular Surgery

## 2022-01-27 DIAGNOSIS — I120 Hypertensive chronic kidney disease with stage 5 chronic kidney disease or end stage renal disease: Secondary | ICD-10-CM | POA: Insufficient documentation

## 2022-01-27 DIAGNOSIS — Z4901 Encounter for fitting and adjustment of extracorporeal dialysis catheter: Secondary | ICD-10-CM | POA: Insufficient documentation

## 2022-01-27 DIAGNOSIS — N186 End stage renal disease: Secondary | ICD-10-CM | POA: Diagnosis not present

## 2022-01-27 DIAGNOSIS — Z95828 Presence of other vascular implants and grafts: Secondary | ICD-10-CM

## 2022-01-27 DIAGNOSIS — Z992 Dependence on renal dialysis: Secondary | ICD-10-CM | POA: Diagnosis not present

## 2022-01-27 DIAGNOSIS — E1122 Type 2 diabetes mellitus with diabetic chronic kidney disease: Secondary | ICD-10-CM | POA: Insufficient documentation

## 2022-01-27 HISTORY — PX: DIALYSIS/PERMA CATHETER REMOVAL: CATH118289

## 2022-01-27 SURGERY — DIALYSIS/PERMA CATHETER REMOVAL
Anesthesia: LOCAL

## 2022-01-27 MED ORDER — LIDOCAINE-EPINEPHRINE (PF) 1 %-1:200000 IJ SOLN
INTRAMUSCULAR | Status: DC | PRN
  Administered 2022-01-27: 20 mL via INTRADERMAL

## 2022-01-27 SURGICAL SUPPLY — 5 items
CHLORAPREP W/TINT 26 (MISCELLANEOUS) IMPLANT
DERMABOND ADVANCED .7 DNX12 (GAUZE/BANDAGES/DRESSINGS) IMPLANT
FORCEPS HALSTEAD CVD 5IN STRL (INSTRUMENTS) IMPLANT
SUT MNCRL AB 4-0 PS2 18 (SUTURE) IMPLANT
TRAY LACERAT/PLASTIC (MISCELLANEOUS) IMPLANT

## 2022-01-27 NOTE — H&P (Signed)
[facility] Admission History & Physical  MRN : [id number]  Eldorado is a 46 y.o. (04/19/1976) female who presents with chief complaint of No chief complaint on file. Marland Kitchen  History of Present Illness: I am asked to evaluate the patient by the dialysis center. The patient was sent here because they have a nonfunctioning tunneled catheter and a functioning AV fistula.  The patient reports they're not been any problems with any of their dialysis runs. They are reporting good flows with good parameters at dialysis.   Patient denies pain or tenderness overlying the access.  There is no pain with dialysis.  The patient denies hand pain or finger pain consistent with steal syndrome.  No fevers or chills while on dialysis.    No current facility-administered medications for this encounter.    Past Medical History:  Diagnosis Date   Anemia    Asthma    well controlled   Complication of anesthesia    "takes alot for anesthesia to work"   Concussion    h/o   COPD (chronic obstructive pulmonary disease) ([place])    Depression    Diabetes ([place])    last A1c was 5.8 on 09-04-20   Dialysis patient ([place])    T, Th, and Sat   Family history of adverse reaction to anesthesia    son-became aggressive after surgery   GERD (gastroesophageal reflux disease)    occ no meds   Headache    migraines   Hypertension    Palpitations    Pneumonia    h/o   Renal disorder    Sleep apnea     Past Surgical History:  Procedure Laterality Date   A/V FISTULAGRAM Left 06/22/2021   Procedure: A/V FISTULAGRAM;  Surgeon: Katha Cabal, MD;  Location: [place] [facility];  Service: Cardiovascular;  Laterality: Left;   AV FISTULA PLACEMENT Left 11/20/2020   Procedure: ARTERIOVENOUS (AV) FISTULA CREATION ( BRACHIAL CEPHALIC );  Surgeon: Katha Cabal, MD;  Location: [facility];  Service: Vascular;  Laterality: Left;   CESAREAN SECTION N/A    x3   CHOLECYSTECTOMY      DIALYSIS/PERMA CATHETER INSERTION N/A 08/24/2020   Procedure: DIALYSIS/PERMA CATHETER INSERTION;  Surgeon: Algernon Huxley, MD;  Location: [place] [facility];  Service: Cardiovascular;  Laterality: N/A;   DIALYSIS/PERMA CATHETER INSERTION N/A 08/03/2021   Procedure: DIALYSIS/PERMA CATHETER INSERTION;  Surgeon: Katha Cabal, MD;  Location: [place] [facility];  Service: Cardiovascular;  Laterality: N/A;   DIALYSIS/PERMA CATHETER REMOVAL N/A 07/22/2021   Procedure: DIALYSIS/PERMA CATHETER REMOVAL;  Surgeon: Algernon Huxley, MD;  Location: [place] [facility];  Service: Cardiovascular;  Laterality: N/A;     Social History   Tobacco Use   Smoking status: Never   Smokeless tobacco: Never  Vaping Use   Vaping Use: Never used  Substance Use Topics   Alcohol use: Never   Drug use: Never     Family History  Problem Relation Age of Onset   Diabetes Mother    Skin cancer Mother    Heart attack Father        died at ~12 years of age   Other Sister        hit by a motorcycle   Alcohol abuse Brother    Diabetes Brother    HIV Brother     No family history of bleeding or clotting disorders, autoimmune disease or porphyria  Allergies  Allergen Reactions   Statins  Other (See Comments)    "redness and facial swelling"   Cholesterol Rash    Cholesterol medicine     REVIEW OF SYSTEMS (Negative unless checked)  Constitutional: [] Weight loss  [] Fever  [] Chills Cardiac: [] Chest pain   [] Chest pressure   [] Palpitations   [] Shortness of breath when laying flat   [] Shortness of breath at rest   [x] Shortness of breath with exertion. Vascular:  [] Pain in legs with walking   [] Pain in legs at rest   [] Pain in legs when laying flat   [] Claudication   [] Pain in feet when walking  [] Pain in feet at rest  [] Pain in feet when laying flat   [] History of DVT   [] Phlebitis   [] Swelling in legs   [] Varicose veins   [] Non-healing ulcers Pulmonary:   [] Uses home oxygen   [] Productive cough   [] Hemoptysis    [] Wheeze  [x] COPD   [x] Asthma Neurologic:  [] Dizziness  [] Blackouts   [] Seizures   [] History of stroke   [] History of TIA  [] Aphasia   [] Temporary blindness   [] Dysphagia   [] Weakness or numbness in arms   [] Weakness or numbness in legs Musculoskeletal:  [] Arthritis   [] Joint swelling   [] Joint pain   [] Low back pain Hematologic:  [] Easy bruising  [] Easy bleeding   [] Hypercoagulable state   [x] Anemic  [] Hepatitis Gastrointestinal:  [] Blood in stool   [] Vomiting blood  [] Gastroesophageal reflux/heartburn   [] Difficulty swallowing. Genitourinary:  [x] Chronic kidney disease   [] Difficult urination  [] Frequent urination  [] Burning with urination   [] Blood in urine Skin:  [] Rashes   [] Ulcers   [] Wounds Psychological:  [] History of anxiety   []  History of major depression.  Physical Examination  Vitals:   01/27/22 1017  BP: (!) 188/99  Pulse: 75  Resp: 18  Temp: 98.5 F (36.9 C)  SpO2: 100%   There is no height or weight on file to calculate BMI. Gen: WD/WN, NAD Head: [place]/AT, No temporalis wasting.  Ear/Nose/Throat: Hearing grossly intact, nares w/o erythema or drainage, oropharynx w/o Erythema/Exudate,  Eyes: Conjunctiva clear, sclera non-icteric Neck: Trachea midline.  No JVD.  Pulmonary:  Good air movement, respirations not labored, no use of accessory muscles.  Cardiac: RRR, normal S1, S2. Vascular:  Vessel Right Left  Radial Palpable Palpable   Musculoskeletal: M/S 5/5 throughout.  Extremities without ischemic changes.  No deformity or atrophy.  Neurologic: Sensation grossly intact in extremities.  Symmetrical.  Speech is fluent. Motor exam as listed above. Psychiatric: Judgment intact, Mood & affect appropriate for pt's clinical situation. Dermatologic: No rashes or ulcers noted.  No cellulitis or open wounds.    CBC Lab Results  Component Value Date   WBC 7.0 08/25/2021   HGB 9.8 (L) 08/25/2021   HCT 29.6 (L) 08/25/2021   MCV 92.2 08/25/2021   PLT 315 08/25/2021     BMET    Component Value Date/Time   NA 135 08/25/2021 1031   K 4.4 08/25/2021 1031   CL 97 (L) 08/25/2021 1031   CO2 26 08/25/2021 1031   GLUCOSE 156 (H) 08/25/2021 1031   BUN 37 (H) 08/25/2021 1031   CREATININE 6.98 (H) 08/25/2021 1031   CALCIUM 7.9 (L) 08/25/2021 1031   GFRNONAA 7 (L) 08/25/2021 1031   GFRAA 17 (L) 12/13/2019 0514   CrCl cannot be calculated (Patient's most recent lab result is older than the maximum 21 days allowed.).  COAG Lab Results  Component Value Date   INR 1.0 11/13/2020    Radiology No  results found.  Assessment/Plan 1.  Complication dialysis device:  Patient's Tunneled catheter is not being used. The patient has an extremity access that is functioning well. Therefore, the patient will undergo removal of the tunneled catheter under local anesthesia.  The risks and benefits were described to the patient.  All questions were answered.  The patient agrees to proceed with angiography and intervention. Potassium will be drawn to ensure that it is an appropriate level prior to performing intervention. 2.  End-stage renal disease requiring hemodialysis:  Patient will continue dialysis therapy without further interruption if a successful intervention is not achieved then a tunneled catheter will be placed. Dialysis has already been arranged. 3.  Hypertension:  Patient will continue medical management; nephrology is following no changes in oral medications. 4. Diabetes mellitus:  Glucose will be monitored and oral medications been held this morning once the patient has undergone the patient's procedure po intake will be reinitiated and again Accu-Cheks will be used to assess the blood glucose level and treat as needed. The patient will be restarted on the patient's usual hypoglycemic regimen    [name], MD  01/27/2022 10:29 AM

## 2022-01-27 NOTE — Op Note (Signed)
Operative Note     Preoperative diagnosis:   1. ESRD with functional permanent access  Postoperative diagnosis:  1. ESRD with functional permanent access  Procedure:  Removal of right jugular Permcath  Surgeon:  [name], MD  Anesthesia:  Local  EBL:  Minimal  Indication for the Procedure:  The patient has a functional permanent dialysis access and no longer needs their permcath.  This can be removed.  Risks and benefits are discussed and informed consent is obtained.  Description of the Procedure:  The patient's right neck, chest and existing catheter were sterilely prepped and draped. The area around the catheter was anesthetized copiously with 1% lidocaine. The catheter was dissected out with curved hemostats until the cuff was freed from the surrounding fibrous sheath.  This required a small counterincision in the supraclavicular space due to the high location of the cuff.  The fiber sheath was transected, and the catheter was then removed in its entirety using gentle traction.  The counterincision in the supraclavicular space was closed with a single 4-0 Monocryl.  Pressure was held and sterile dressings were placed. The patient tolerated the procedure well and was taken to the recovery room in stable condition.     [name]  01/27/2022, 11:55 AM This note was created with Dragon Medical transcription system. Any errors in dictation are purely unintentional.

## 2022-01-27 NOTE — Discharge Instructions (Signed)
Tunneled Catheter Removal, Care After Refer to this sheet in the next few weeks. These instructions provide you with information about caring for yourself after your procedure. Your health care provider may also give you more specific instructions. Your treatment has been planned according to current medical practices, but problems sometimes occur. Call your health care provider if you have any problems or questions after your procedure. What can I expect after the procedure? After the procedure, it is common to have: Some mild redness, swelling, and pain around your catheter site.   Follow these instructions at home: Incision care  Check your removal site  every day for signs of infection. Check for: More redness, swelling, or pain. More fluid or blood. Warmth. Pus or a bad smell. Remove your dressing in 48hrs leave open to air  Activity  Return to your normal activities as told by your health care provider. Ask your health care provider what activities are safe for you. Do not lift anything that is heavier than 10 lb (4.5 kg) for 3 days  You may shower tomorrow  Contact a health care provider if: You have more fluid or blood coming from your removal site You have more redness, swelling, or pain at your incisions or around the area where your catheter was removed Your removal site feel warm to the touch. You feel unusually weak. You feel nauseous.. Get help right away if You have swelling in your arm, shoulder, neck, or face. You develop chest pain. You have difficulty breathing. You feel dizzy or light-headed. You have pus or a bad smell coming from your removal site You have a fever. You develop bleeding from your removal site, and your bleeding does not stop. This information is not intended to replace advice given to you by your health care provider. Make sure you discuss any questions you have with your health care provider. Document Released: 04/18/2012 Document Revised:  01/03/2016 Document Reviewed: 01/26/2015 Elsevier Interactive Patient Education  2017 [employer] 

## 2022-02-09 ENCOUNTER — Emergency Department
Admission: EM | Admit: 2022-02-09 | Discharge: 2022-02-09 | Disposition: A | Discharge status: Home / Self Care | Payer: Self-pay | Attending: Emergency Medicine | Admitting: Emergency Medicine

## 2022-02-09 ENCOUNTER — Encounter: Payer: Self-pay | Admitting: Intensive Care

## 2022-02-09 ENCOUNTER — Other Ambulatory Visit: Payer: Self-pay

## 2022-02-09 ENCOUNTER — Emergency Department: Payer: Self-pay

## 2022-02-09 DIAGNOSIS — S161XXA Strain of muscle, fascia and tendon at neck level, initial encounter: Secondary | ICD-10-CM | POA: Insufficient documentation

## 2022-02-09 DIAGNOSIS — N186 End stage renal disease: Secondary | ICD-10-CM | POA: Insufficient documentation

## 2022-02-09 DIAGNOSIS — S0990XA Unspecified injury of head, initial encounter: Secondary | ICD-10-CM | POA: Diagnosis not present

## 2022-02-09 DIAGNOSIS — S199XXA Unspecified injury of neck, initial encounter: Secondary | ICD-10-CM | POA: Diagnosis present

## 2022-02-09 DIAGNOSIS — Y92481 Parking lot as the place of occurrence of the external cause: Secondary | ICD-10-CM | POA: Diagnosis not present

## 2022-02-09 DIAGNOSIS — Z992 Dependence on renal dialysis: Secondary | ICD-10-CM | POA: Diagnosis not present

## 2022-02-09 MED ORDER — OXYCODONE-ACETAMINOPHEN 5-325 MG PO TABS
1.0000 | ORAL_TABLET | Freq: Once | ORAL | Status: AC
  Administered 2022-02-09: 1 via ORAL
  Filled 2022-02-09: qty 1

## 2022-02-09 MED ORDER — HYDROCODONE-ACETAMINOPHEN 5-325 MG PO TABS
1.0000 | ORAL_TABLET | Freq: Once | ORAL | Status: AC
  Administered 2022-02-09: 1 via ORAL
  Filled 2022-02-09: qty 1

## 2022-02-09 MED ORDER — HYDROCODONE-ACETAMINOPHEN 5-325 MG PO TABS: 1.0000 | ORAL_TABLET | Freq: Four times a day (QID) | ORAL | 0 refills | Status: DC | PRN
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
## 2022-02-09 NOTE — ED Notes (Signed)
Pt verbalized understanding of d/c instructions, prescription and follow up care. Pt A&OX4 wheeled out of ED via wheelchair.
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
## 2022-03-15 DIAGNOSIS — N186 End stage renal disease: Secondary | ICD-10-CM | POA: Diagnosis not present

## 2022-03-15 DIAGNOSIS — Z992 Dependence on renal dialysis: Secondary | ICD-10-CM | POA: Diagnosis not present

## 2022-04-14 DIAGNOSIS — Z992 Dependence on renal dialysis: Secondary | ICD-10-CM | POA: Diagnosis not present

## 2022-04-14 DIAGNOSIS — N186 End stage renal disease: Secondary | ICD-10-CM | POA: Diagnosis not present

## 2022-04-15 DIAGNOSIS — Z992 Dependence on renal dialysis: Secondary | ICD-10-CM | POA: Diagnosis not present

## 2022-04-15 DIAGNOSIS — N186 End stage renal disease: Secondary | ICD-10-CM | POA: Diagnosis not present

## 2022-04-18 DIAGNOSIS — Z992 Dependence on renal dialysis: Secondary | ICD-10-CM | POA: Diagnosis not present

## 2022-04-18 DIAGNOSIS — N186 End stage renal disease: Secondary | ICD-10-CM | POA: Diagnosis not present

## 2022-04-20 DIAGNOSIS — Z992 Dependence on renal dialysis: Secondary | ICD-10-CM | POA: Diagnosis not present

## 2022-04-20 DIAGNOSIS — N186 End stage renal disease: Secondary | ICD-10-CM | POA: Diagnosis not present

## 2022-04-22 DIAGNOSIS — Z992 Dependence on renal dialysis: Secondary | ICD-10-CM | POA: Diagnosis not present

## 2022-04-22 DIAGNOSIS — R300 Dysuria: Secondary | ICD-10-CM | POA: Diagnosis not present

## 2022-04-22 DIAGNOSIS — N186 End stage renal disease: Secondary | ICD-10-CM | POA: Diagnosis not present

## 2022-04-25 DIAGNOSIS — R300 Dysuria: Secondary | ICD-10-CM | POA: Diagnosis not present

## 2022-04-25 DIAGNOSIS — Z992 Dependence on renal dialysis: Secondary | ICD-10-CM | POA: Diagnosis not present

## 2022-04-25 DIAGNOSIS — N186 End stage renal disease: Secondary | ICD-10-CM | POA: Diagnosis not present

## 2022-04-27 DIAGNOSIS — R300 Dysuria: Secondary | ICD-10-CM | POA: Diagnosis not present

## 2022-04-27 DIAGNOSIS — N186 End stage renal disease: Secondary | ICD-10-CM | POA: Diagnosis not present

## 2022-04-27 DIAGNOSIS — Z992 Dependence on renal dialysis: Secondary | ICD-10-CM | POA: Diagnosis not present

## 2022-04-29 DIAGNOSIS — R300 Dysuria: Secondary | ICD-10-CM | POA: Diagnosis not present

## 2022-04-29 DIAGNOSIS — Z992 Dependence on renal dialysis: Secondary | ICD-10-CM | POA: Diagnosis not present

## 2022-04-29 DIAGNOSIS — N186 End stage renal disease: Secondary | ICD-10-CM | POA: Diagnosis not present

## 2022-04-30 DIAGNOSIS — R5383 Other fatigue: Secondary | ICD-10-CM | POA: Diagnosis not present

## 2022-04-30 DIAGNOSIS — R5381 Other malaise: Secondary | ICD-10-CM | POA: Diagnosis not present

## 2022-04-30 DIAGNOSIS — Z794 Long term (current) use of insulin: Secondary | ICD-10-CM | POA: Diagnosis not present

## 2022-04-30 DIAGNOSIS — R300 Dysuria: Secondary | ICD-10-CM | POA: Diagnosis not present

## 2022-04-30 DIAGNOSIS — M791 Myalgia, unspecified site: Secondary | ICD-10-CM | POA: Diagnosis not present

## 2022-04-30 DIAGNOSIS — R531 Weakness: Secondary | ICD-10-CM | POA: Diagnosis not present

## 2022-04-30 DIAGNOSIS — D649 Anemia, unspecified: Secondary | ICD-10-CM | POA: Diagnosis not present

## 2022-04-30 DIAGNOSIS — N3090 Cystitis, unspecified without hematuria: Secondary | ICD-10-CM | POA: Diagnosis not present

## 2022-04-30 DIAGNOSIS — I129 Hypertensive chronic kidney disease with stage 1 through stage 4 chronic kidney disease, or unspecified chronic kidney disease: Secondary | ICD-10-CM | POA: Diagnosis not present

## 2022-04-30 DIAGNOSIS — Z79899 Other long term (current) drug therapy: Secondary | ICD-10-CM | POA: Diagnosis not present

## 2022-04-30 DIAGNOSIS — E1122 Type 2 diabetes mellitus with diabetic chronic kidney disease: Secondary | ICD-10-CM | POA: Diagnosis not present

## 2022-04-30 DIAGNOSIS — N189 Chronic kidney disease, unspecified: Secondary | ICD-10-CM | POA: Diagnosis not present

## 2022-05-02 DIAGNOSIS — Z992 Dependence on renal dialysis: Secondary | ICD-10-CM | POA: Diagnosis not present

## 2022-05-02 DIAGNOSIS — R300 Dysuria: Secondary | ICD-10-CM | POA: Diagnosis not present

## 2022-05-02 DIAGNOSIS — N186 End stage renal disease: Secondary | ICD-10-CM | POA: Diagnosis not present

## 2022-05-04 DIAGNOSIS — N186 End stage renal disease: Secondary | ICD-10-CM | POA: Diagnosis not present

## 2022-05-04 DIAGNOSIS — Z992 Dependence on renal dialysis: Secondary | ICD-10-CM | POA: Diagnosis not present

## 2022-05-04 DIAGNOSIS — R300 Dysuria: Secondary | ICD-10-CM | POA: Diagnosis not present

## 2022-05-06 DIAGNOSIS — Z992 Dependence on renal dialysis: Secondary | ICD-10-CM | POA: Diagnosis not present

## 2022-05-06 DIAGNOSIS — N186 End stage renal disease: Secondary | ICD-10-CM | POA: Diagnosis not present

## 2022-05-08 DIAGNOSIS — Z992 Dependence on renal dialysis: Secondary | ICD-10-CM | POA: Diagnosis not present

## 2022-05-08 DIAGNOSIS — N186 End stage renal disease: Secondary | ICD-10-CM | POA: Diagnosis not present

## 2022-05-11 DIAGNOSIS — Z992 Dependence on renal dialysis: Secondary | ICD-10-CM | POA: Diagnosis not present

## 2022-05-11 DIAGNOSIS — N186 End stage renal disease: Secondary | ICD-10-CM | POA: Diagnosis not present

## 2022-05-13 DIAGNOSIS — N186 End stage renal disease: Secondary | ICD-10-CM | POA: Diagnosis not present

## 2022-05-13 DIAGNOSIS — Z992 Dependence on renal dialysis: Secondary | ICD-10-CM | POA: Diagnosis not present

## 2022-05-15 DIAGNOSIS — Z992 Dependence on renal dialysis: Secondary | ICD-10-CM | POA: Diagnosis not present

## 2022-05-15 DIAGNOSIS — N186 End stage renal disease: Secondary | ICD-10-CM | POA: Diagnosis not present

## 2022-05-23 IMAGING — CR DG CHEST 2V
2 series · 2 of 2 positions shown · non-contrast
Comparison: 12/08/2019

CLINICAL DATA: Chest pain for the past 2 days with shortness of
breath. History of asthma.

EXAM:
CHEST - 2 VIEW

[chest pa]
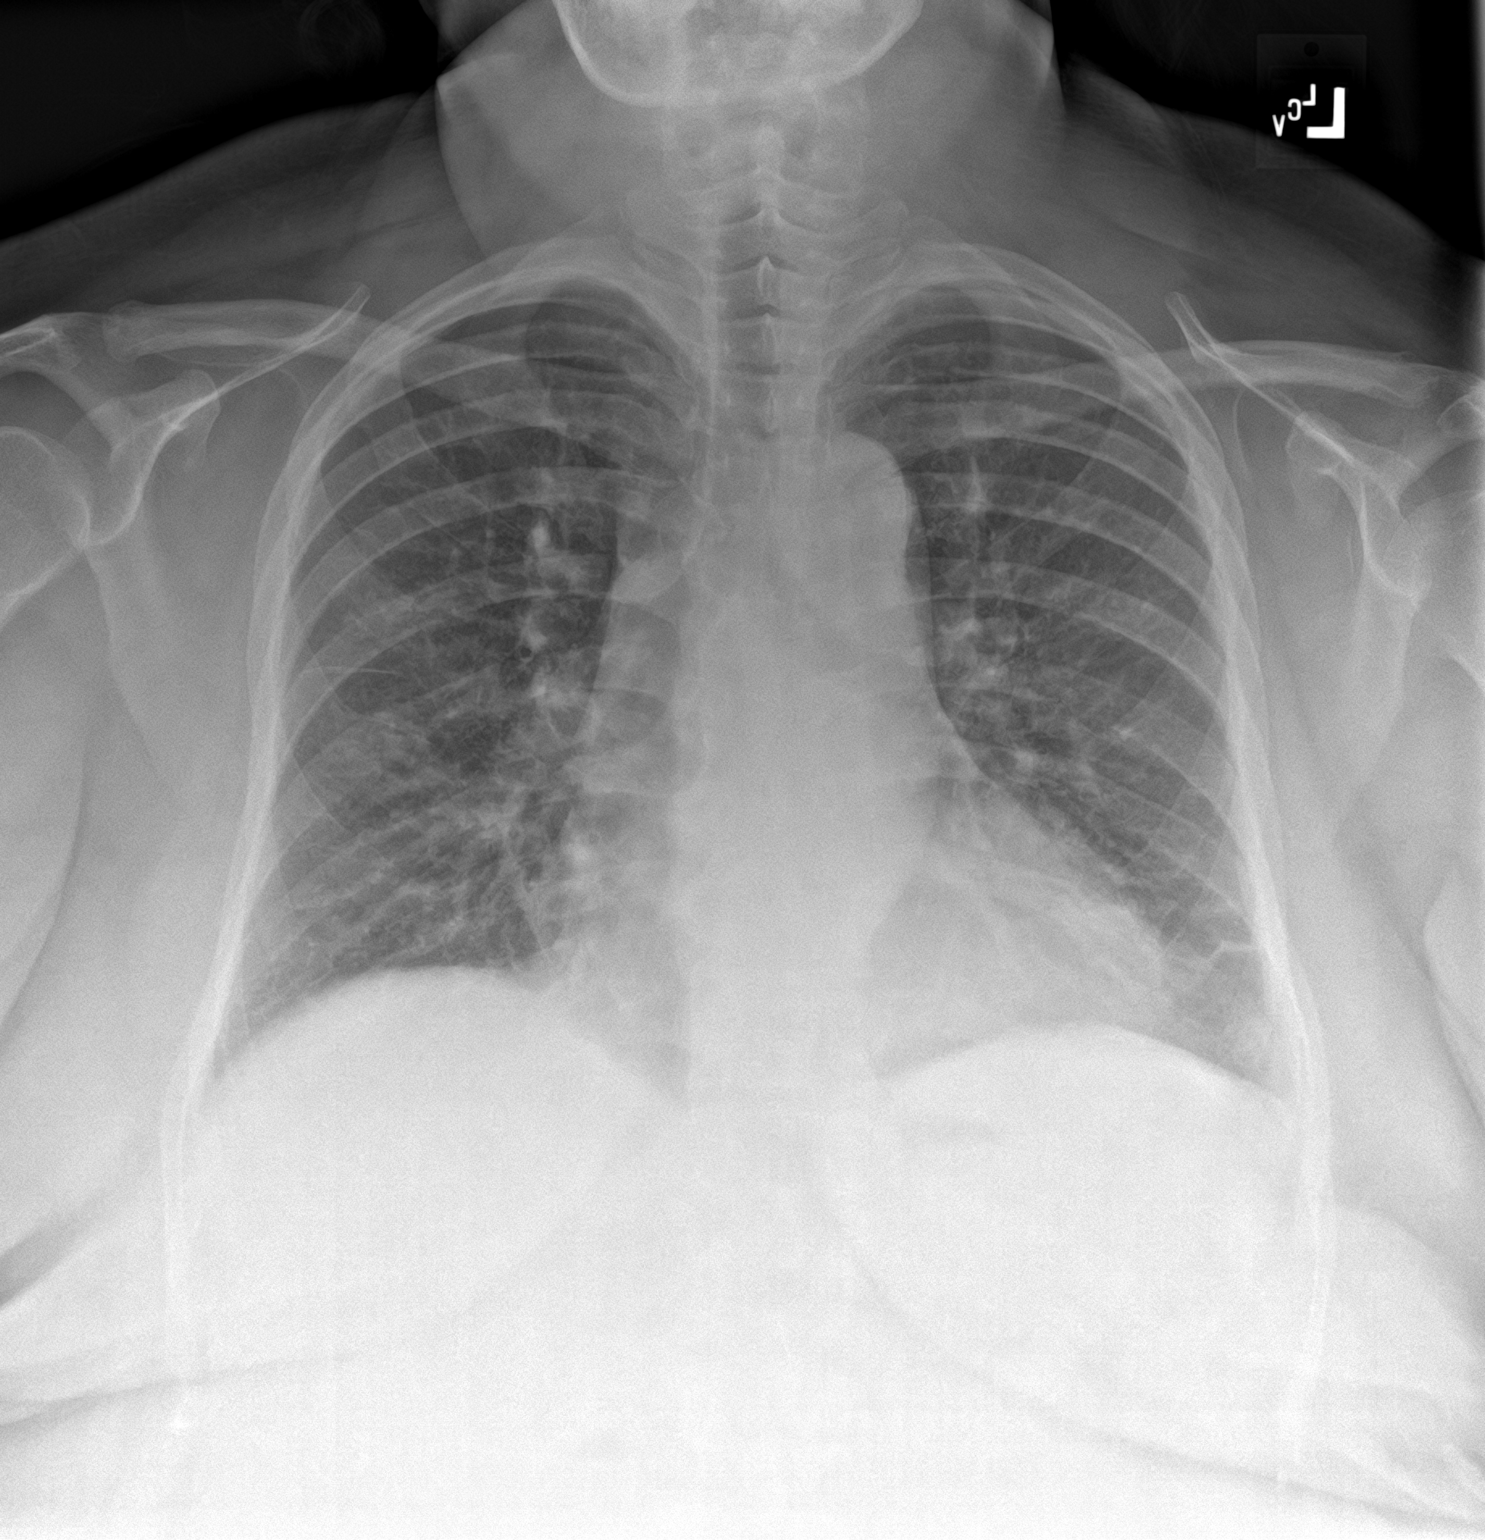

[chest lat]
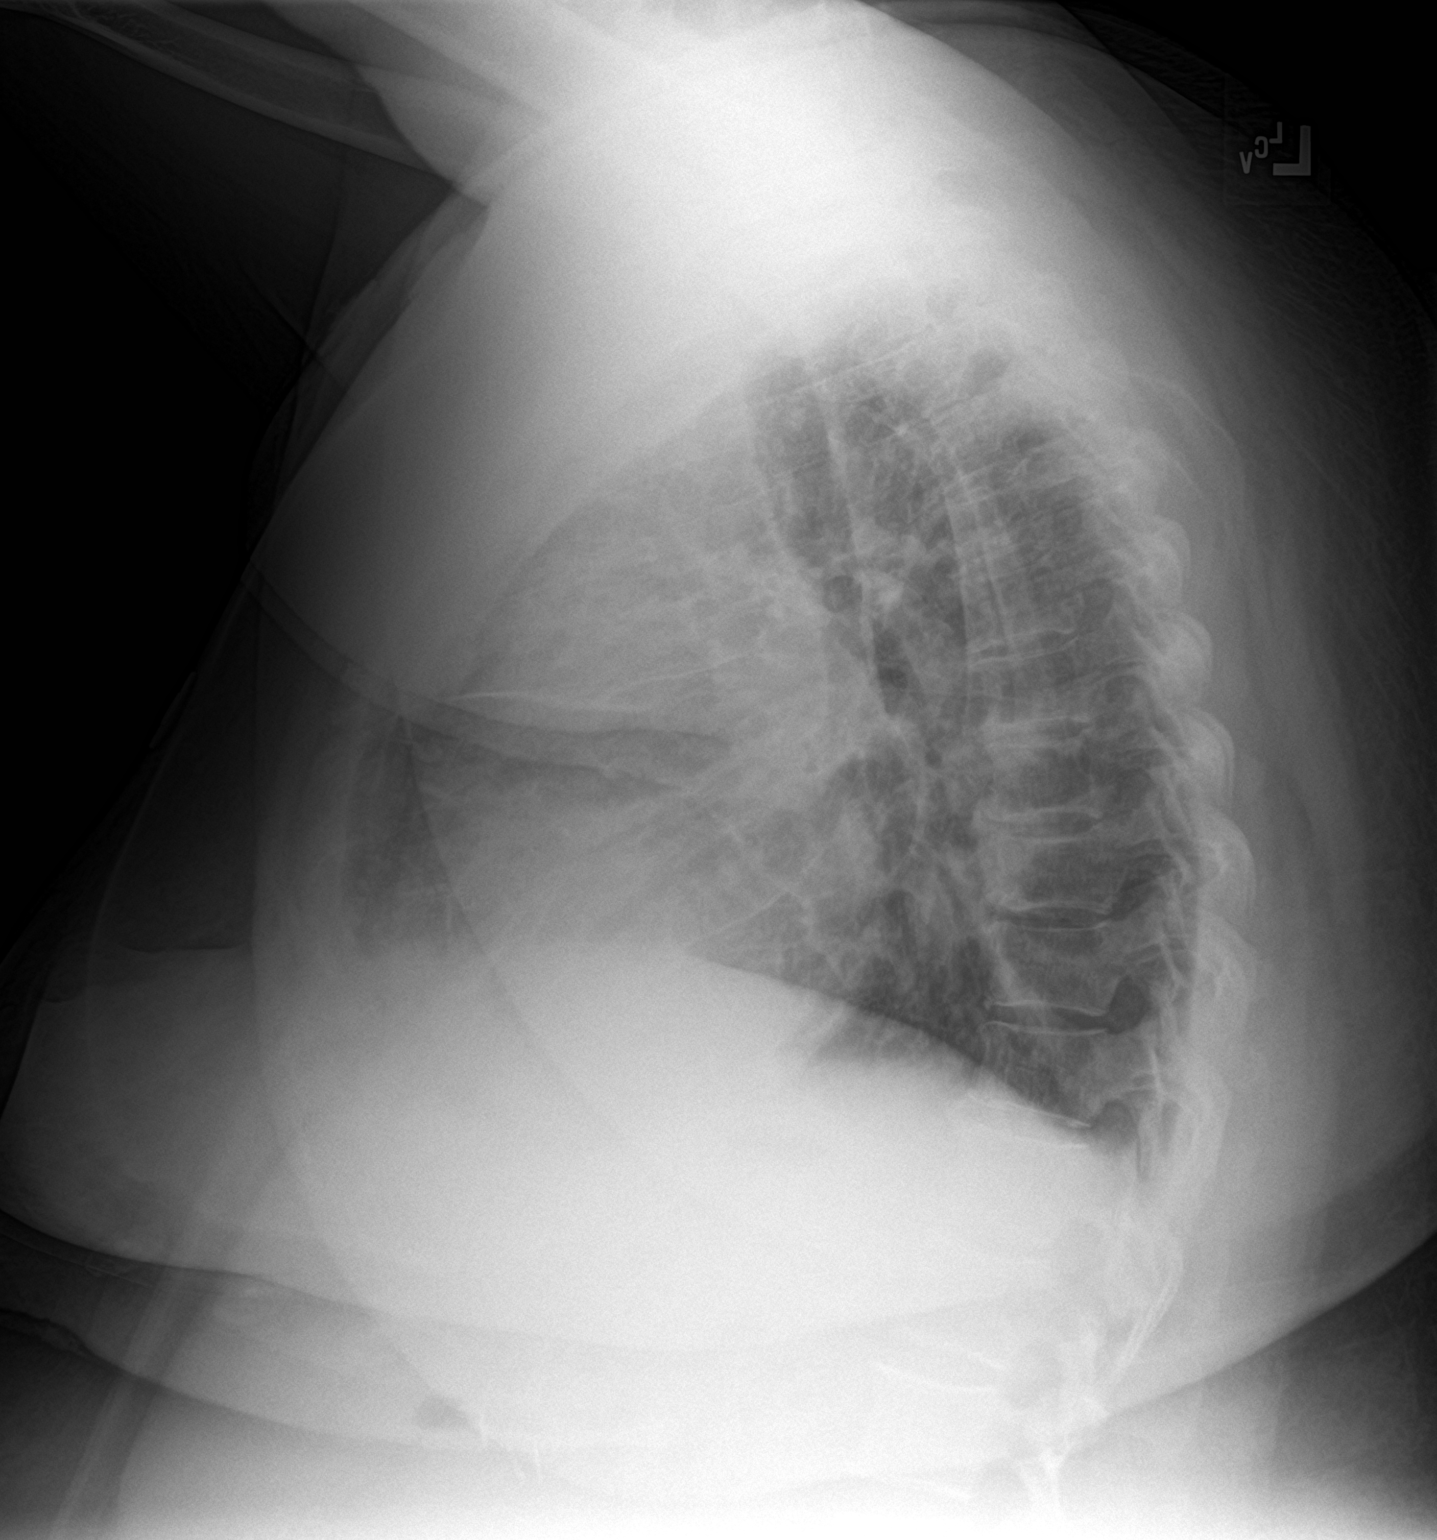

[2 of 2 positions shown; findings below may reference images not displayed]

FINDINGS: Borderline enlarged cardiac silhouette with a minimal increase in
size. Tortuous aorta. Stable linear scarring at the left lung base.
Otherwise, clear lungs. Thoracic spine degenerative changes.
IMPRESSION: No acute abnormality.

## 2022-09-03 IMAGING — CR DG CHEST 2V
1 series · 2 of 2 positions shown · non-contrast
Comparison: 08/23/2020

CLINICAL DATA: Shortness of breath.  Headache and nausea.

EXAM:
CHEST - 2 VIEW

[Series 1: dg chest 2 view · 0.14mm/px · 2 of 2 slices shown]
[im 1/2]
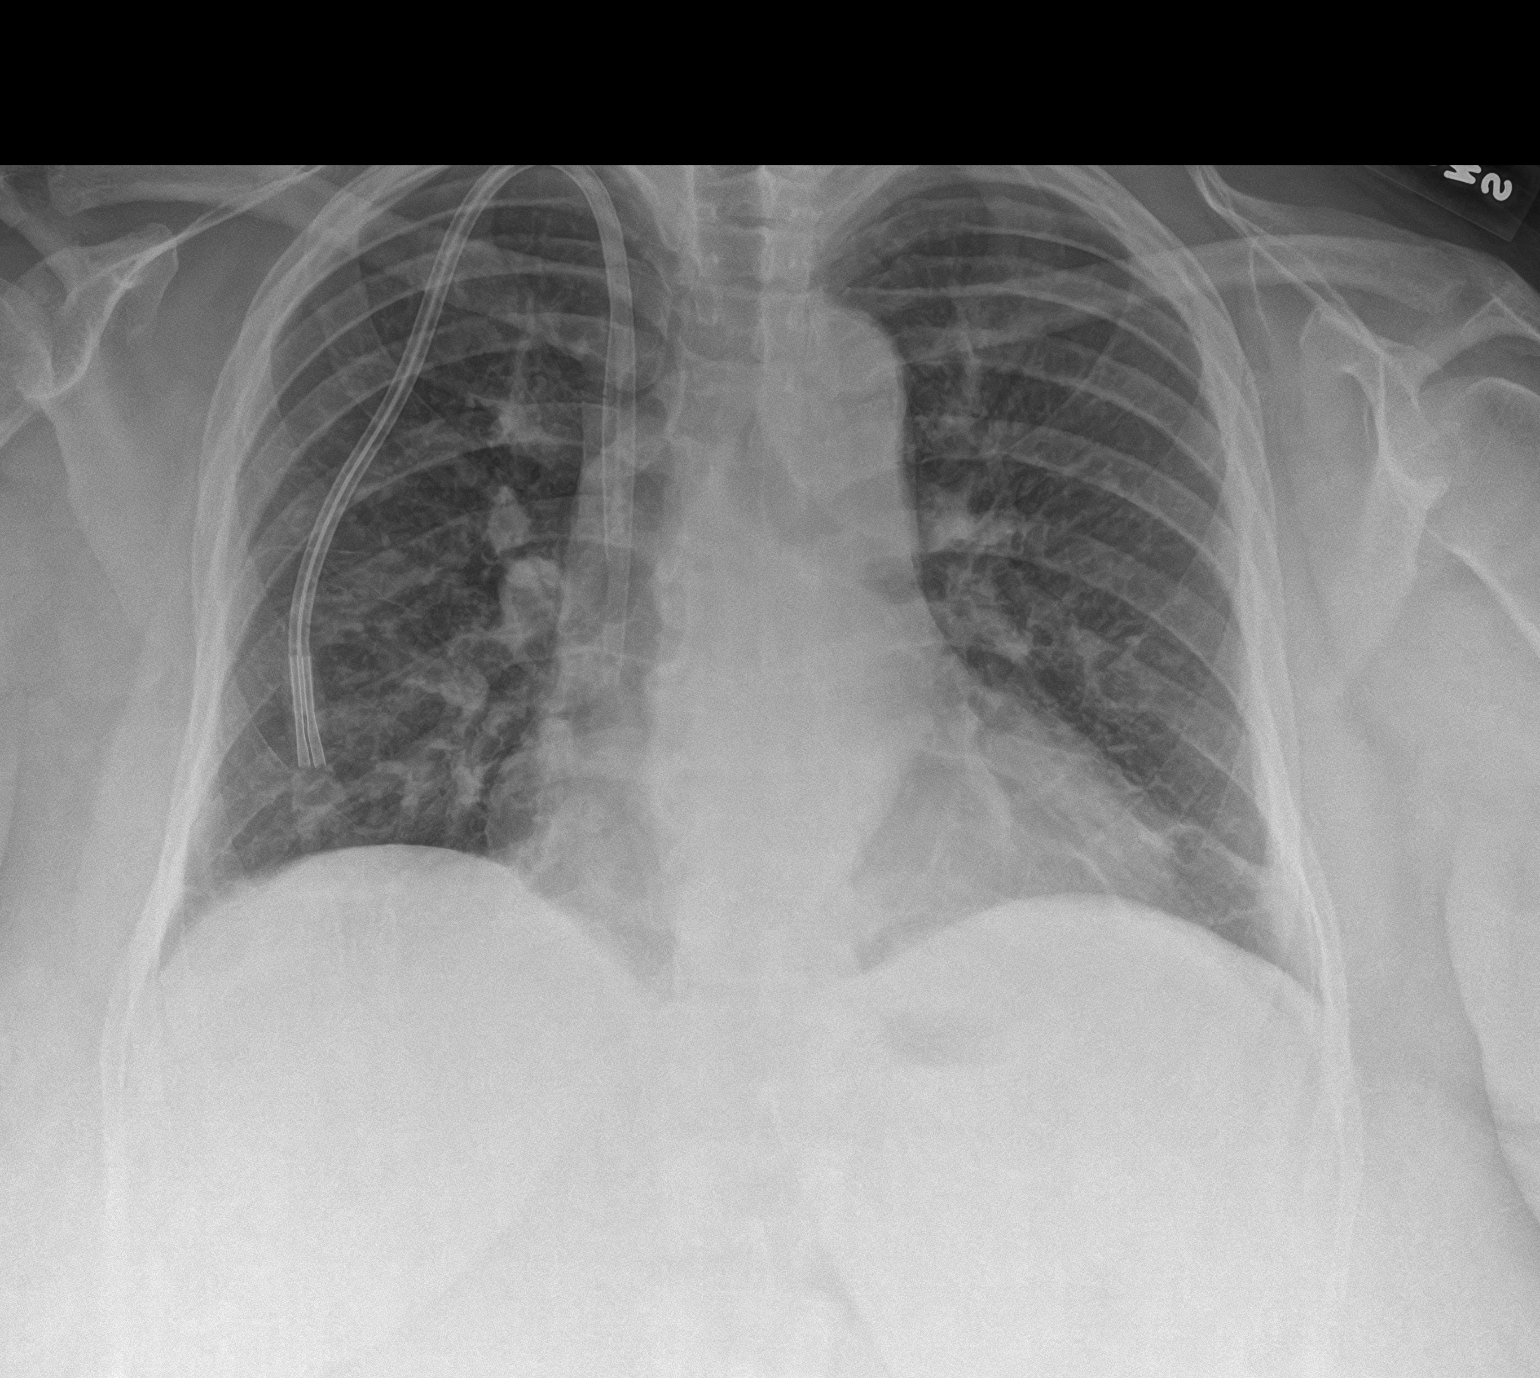
[im 2/2]
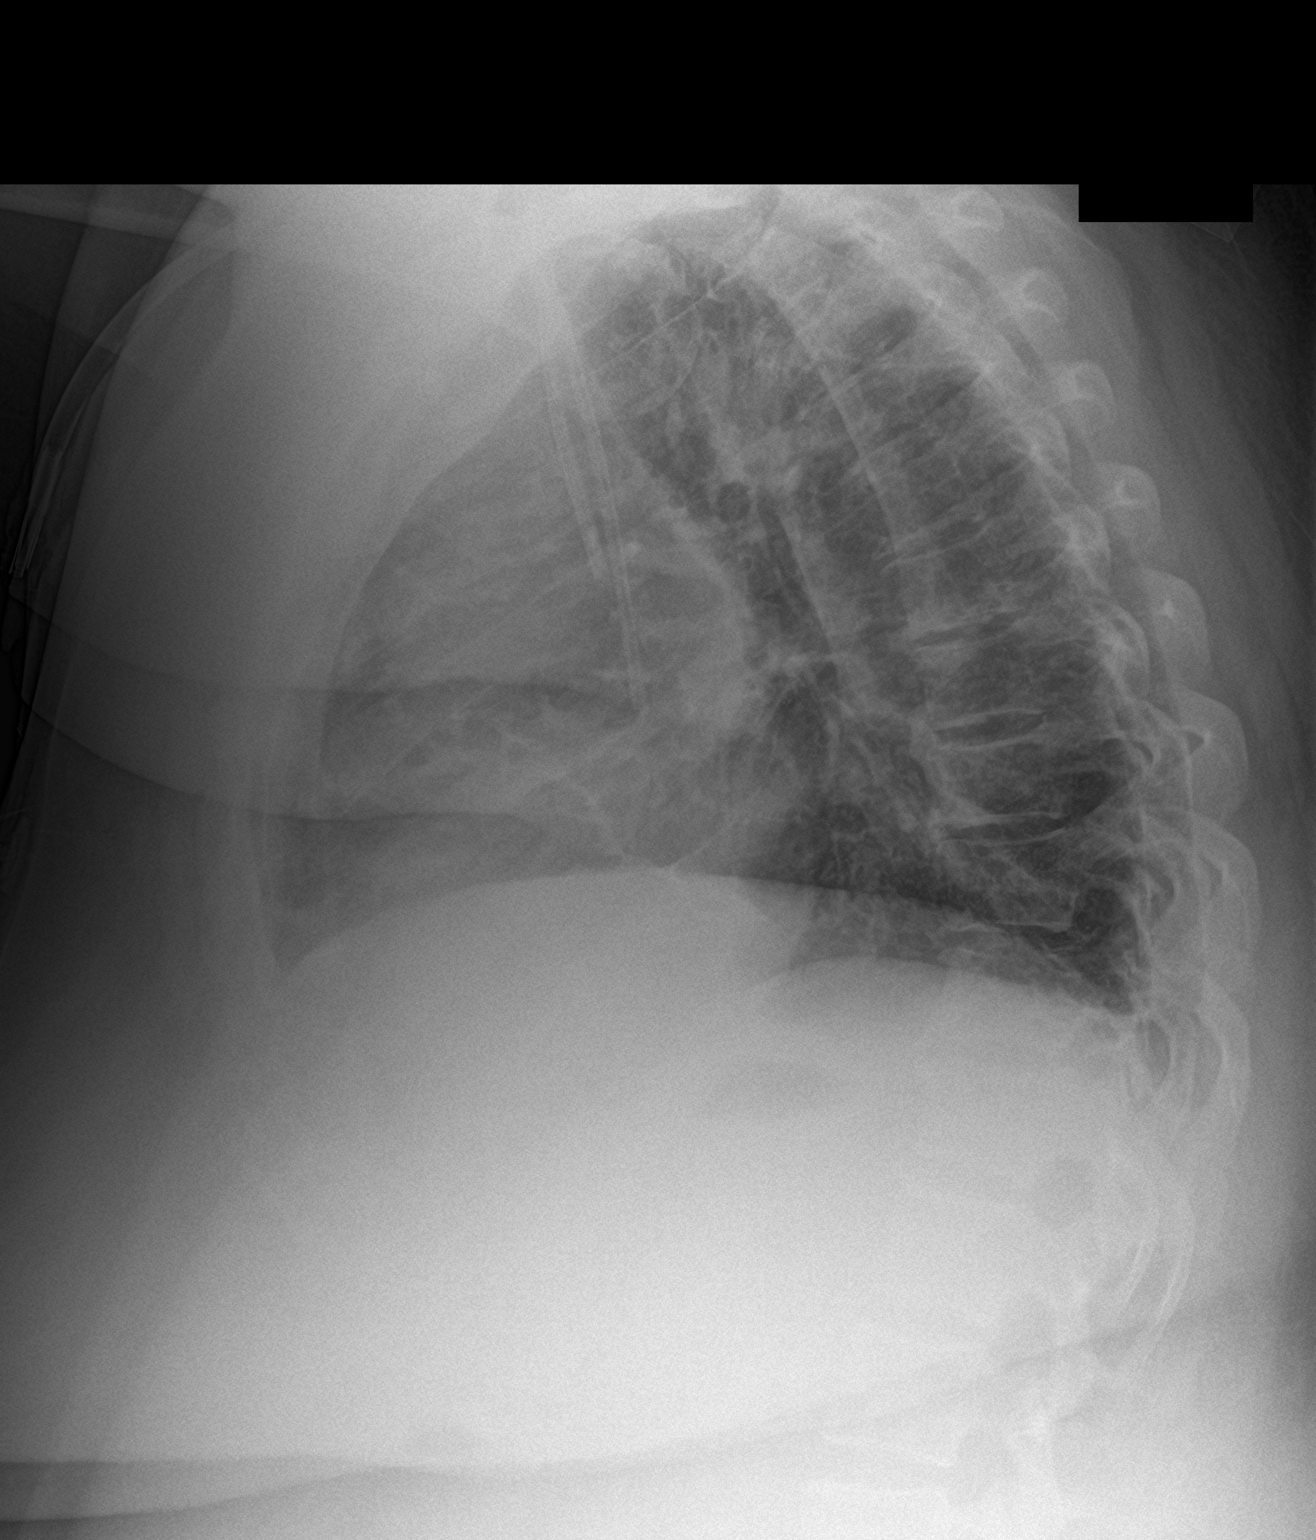

[2 of 2 positions shown; findings below may reference images not displayed]

FINDINGS: Right IJ dialysis catheter distal tip: SVC. No pneumothorax or
complicating feature.

Bibasilar subsegmental atelectasis or scarring, left greater than
right.

Thoracic spondylosis. Mild enlargement of the cardiopericardial
silhouette, without edema. No blunting of the costophrenic angles.
IMPRESSION: 1. Right IJ dialysis catheter tip: Lower SVC.
2. Bibasilar subsegmental atelectasis versus scarring.
3. Mild enlargement of the cardiopericardial silhouette, without
edema.

## 2022-09-14 NOTE — Telephone Encounter (Signed)
Error

## 2023-03-09 ENCOUNTER — Encounter: Payer: Self-pay | Admitting: Emergency Medicine

## 2023-03-09 ENCOUNTER — Emergency Department
Admission: EM | Admit: 2023-03-09 | Discharge: 2023-03-09 | Disposition: A | Discharge status: Home / Self Care | Payer: 59 | Attending: Emergency Medicine | Admitting: Emergency Medicine

## 2023-03-09 ENCOUNTER — Other Ambulatory Visit: Payer: Self-pay

## 2023-03-09 DIAGNOSIS — J449 Chronic obstructive pulmonary disease, unspecified: Secondary | ICD-10-CM | POA: Insufficient documentation

## 2023-03-09 DIAGNOSIS — T82848A Pain from vascular prosthetic devices, implants and grafts, initial encounter: Secondary | ICD-10-CM | POA: Insufficient documentation

## 2023-03-09 DIAGNOSIS — E119 Type 2 diabetes mellitus without complications: Secondary | ICD-10-CM | POA: Insufficient documentation

## 2023-03-09 DIAGNOSIS — Y828 Other medical devices associated with adverse incidents: Secondary | ICD-10-CM | POA: Insufficient documentation

## 2023-03-09 DIAGNOSIS — I10 Essential (primary) hypertension: Secondary | ICD-10-CM | POA: Insufficient documentation

## 2023-03-09 MED ORDER — ACETAMINOPHEN 325 MG PO TABS
650.0000 mg | ORAL_TABLET | Freq: Once | ORAL | Status: AC
  Administered 2023-03-09: 650 mg via ORAL
  Filled 2023-03-09: qty 2

## 2023-03-09 NOTE — ED Triage Notes (Addendum)
Pt with ongoing pain around her fistula.  Has an odd sensation and pain when the access her fistula.  Had stent placed in her arm on that side in April.  Continues to get evaluated she states at chiropractor, PCP, etc and feels nothing is being done.  States she was told it may be a "nerve problem and may need a replacement fistula"  Pt is not agreeable with that plan.  Had full dialysis tx this morning.   Was told she may need an ultrasound to evaluate the fistula but was scared to wait for that.

## 2023-03-09 NOTE — Discharge Instructions (Addendum)
Please follow up with vascular surgery. Call the surgeons office and schedule an appointment.   Reach out to your primary care provider to determine when the ultrasound will be scheduled. You can take the muscle relaxer they prescribed as needed for pain. You can also take 650 of tylenol every 6 hours.
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
## 2023-03-31 ENCOUNTER — Ambulatory Visit: Payer: Self-pay | Admitting: Cardiovascular Disease

## 2023-05-24 ENCOUNTER — Ambulatory Visit: Payer: Self-pay | Admitting: Gerontology

## 2023-05-24 ENCOUNTER — Encounter: Payer: Self-pay | Admitting: Gerontology

## 2023-05-24 ENCOUNTER — Other Ambulatory Visit: Payer: Self-pay

## 2023-05-24 VITALS — BP 159/85 | HR 80 | Ht 61.0 in | Wt 267.8 lb

## 2023-05-24 DIAGNOSIS — E1122 Type 2 diabetes mellitus with diabetic chronic kidney disease: Secondary | ICD-10-CM

## 2023-05-24 DIAGNOSIS — N390 Urinary tract infection, site not specified: Secondary | ICD-10-CM | POA: Insufficient documentation

## 2023-05-24 LAB — POCT URINALYSIS DIPSTICK
Glucose, UA: POSITIVE — AB
Ketones, UA: NEGATIVE
Nitrite, UA: POSITIVE
Protein, UA: NEGATIVE
Spec Grav, UA: 1.02 (ref 1.010–1.025)
Urobilinogen, UA: 0.2 U/dL
pH, UA: >=9 — AB (ref 5.0–8.0)

## 2023-05-24 MED ORDER — FLUCONAZOLE 100 MG PO TABS
100.0000 mg | ORAL_TABLET | Freq: Every day | ORAL | 0 refills | Status: DC
  Filled 2023-05-24: qty 2, 2d supply, fill #0

## 2023-05-24 MED ORDER — SULFAMETHOXAZOLE-TRIMETHOPRIM 400-80 MG PO TABS
1.0000 | ORAL_TABLET | Freq: Every day | ORAL | 0 refills | Status: DC
Start: 2023-05-24 — End: 2023-06-13
  Filled 2023-05-24: qty 7, 7d supply, fill #0

## 2023-05-24 MED ORDER — ACCU-CHEK GUIDE ME W/DEVICE KIT
PACK | Freq: Four times a day (QID) | 0 refills | Status: AC
  Filled 2023-05-24: qty 1, 30d supply, fill #0

## 2023-05-24 MED ORDER — ACCU-CHEK GUIDE TEST VI STRP: ORAL_STRIP | 0 refills | Status: AC

## 2023-05-24 MED ORDER — ACCU-CHEK SOFTCLIX LANCETS MISC
0 refills | Status: AC
Start: 2023-05-24 — End: ?
  Filled 2023-05-24: qty 100, 25d supply, fill #0

## 2023-05-24 NOTE — Progress Notes (Signed)
 Established Patient Office Visit  Subjective   Patient ID: [name], female    DOB: Apr 14, 1976  Age: 48 y.o. MRN: [id number]  Chief Complaint  Patient presents with   Urinary Tract Infection    HPI  [name]   is a 48 year old female who history of Asthma,T2 Diabetes, Hypertension, Renal disorder on hemodialysis who presents after her last visit July 2023 for c/o Urinary Tract Infection.  She states that her c/o dysuria, urinary frequency, and urgency, but denies fever, chills and flank pain.  Urine dip stick done during visit was showed positive Nitrite, Blood 3+, Glucose 2+, Bil 1+, Protein 3+, Leu trace. She has left upper arm Fistula for Hemodialysis with positive thrills and bruit and was last dialyzed yesterday.  She was seen at [facility] on 04/26/23 and her HgbA1c checked 2 months ago was 7.8%, she denies hypo/hyperglycemic symptoms and peripheral neuropathy. She states that she has not checked her blood glucose because she doesn't have glucometer. She also states that she's uninsured , because her insurance was cancelled. Overall, she states that's she's doing well and offers no further complaint.  Review of Systems  Constitutional: Negative.   Eyes: Negative.   Respiratory: Negative.    Cardiovascular: Negative.   Genitourinary:  Positive for dysuria, frequency and urgency. Negative for flank pain and hematuria.  Skin: Negative.   Neurological: Negative.   Psychiatric/Behavioral: Negative.        Objective:     BP (!) 159/85 (BP Location: Right Arm, Patient Position: Sitting, Cuff Size: Large)   Pulse 80   Ht 5' 1 (1.549 m)   Wt 267 lb 12.8 oz (121.5 kg)   LMP 05/17/2023 (Exact Date)   SpO2 94%   BMI 50.60 kg/m  BP Readings from Last 3 Encounters:  05/24/23 (!) 159/85  03/09/23 (!) 149/91  02/09/22 (!) 167/91   Wt Readings from Last 3 Encounters:  05/24/23 267 lb 12.8 oz (121.5 kg)  02/09/22 260 lb (117.9 kg)  12/09/21 269 lb 12.8 oz  (122.4 kg)      Physical Exam HENT:     Head: Normocephalic and atraumatic.     Mouth/Throat:     Mouth: Mucous membranes are moist.  Eyes:     Pupils: Pupils are equal, round, and reactive to light.  Cardiovascular:     Rate and Rhythm: Normal rate and regular rhythm.     Pulses: Normal pulses.     Heart sounds: Normal heart sounds.  Pulmonary:     Effort: Pulmonary effort is normal.     Breath sounds: Normal breath sounds.  Abdominal:     Tenderness: There is no right CVA tenderness or left CVA tenderness.  Skin:    General: Skin is warm.  Neurological:     General: No focal deficit present.     Mental Status: She is alert and oriented to person, place, and time.  Psychiatric:        Mood and Affect: Mood normal.        Behavior: Behavior normal.      Results for orders placed or performed in visit on 05/24/23  POCT Urinalysis Dipstick  Result Value Ref Range   Color, UA pink    Clarity, UA clear    Glucose, UA Positive (A) Negative   Bilirubin, UA 1+    Ketones, UA neg    Spec Grav, UA 1.020 1.010 - 1.025   Blood, UA 3+    pH, UA >=  9.0 (A) 5.0 - 8.0   Protein, UA Negative Negative   Urobilinogen, UA 0.2 0.2 or 1.0 E.U./dL   Nitrite, UA pos    Leukocytes, UA Trace (A) Negative   Appearance clear    Odor      Last CBC Lab Results  Component Value Date   WBC 7.0 08/25/2021   HGB 9.8 (L) 08/25/2021   HCT 29.6 (L) 08/25/2021   MCV 92.2 08/25/2021   MCH 30.5 08/25/2021   RDW 12.9 08/25/2021   PLT 315 08/25/2021   Last metabolic panel Lab Results  Component Value Date   GLUCOSE 156 (H) 08/25/2021   NA 135 08/25/2021   K 4.4 08/25/2021   CL 97 (L) 08/25/2021   CO2 26 08/25/2021   BUN 37 (H) 08/25/2021   CREATININE 6.98 (H) 08/25/2021   GFRNONAA 7 (L) 08/25/2021   CALCIUM  7.9 (L) 08/25/2021   PHOS 3.8 07/31/2021   PROT 8.0 08/25/2021   ALBUMIN  3.8 08/25/2021   BILITOT 0.7 08/25/2021   ALKPHOS 123 08/25/2021   AST 17 08/25/2021   ALT 16  08/25/2021   ANIONGAP 12 08/25/2021   Last lipids Lab Results  Component Value Date   CHOL 183 03/10/2021   HDL 46 03/10/2021   LDLCALC 109 (H) 03/10/2021   TRIG 162 (H) 03/10/2021   CHOLHDL 4.0 03/10/2021   Last hemoglobin A1c Lab Results  Component Value Date   HGBA1C 8.8 (A) 12/09/2021   Last thyroid  functions Lab Results  Component Value Date   TSH 3.921 08/29/2020   Last vitamin D No results found for: 25OHVITD2, 25OHVITD3, VD25OH Last vitamin B12 and Folate No results found for: VITAMINB12, FOLATE    The 10-year ASCVD risk score (Arnett DK, et al., 2019) is: 5.4%    Assessment & Plan:    1. Recurrent urinary tract infection -She was started on Bactrim  400/80 mg daily, was educated on medication side effects and she consented to treatment. She was advised to practice proper perineal hygiene and go to the ED for worsening symptoms. -- sulfamethoxazole -trimethoprim  (BACTRIM  DS) 800-160 MG tablet; Take 1 tablet by mouth daily. Take 1/2 tablet daily 400/80 mg daily Take medication after dialysis  Dispense: 4 tablet; Refill: 0 - fluconazole  (DIFLUCAN ) 100 MG tablet; Take 1 tablet (100 mg total) by mouth daily.  Dispense: 2 tablet; Refill: 0 - POCT Urinalysis Dipstick  2. Type 2 diabetes mellitus with chronic kidney disease on chronic dialysis, unspecified whether long term insulin  use (HCC) (Primary) - She was provided with glucometer, advised to check her blood glucose bid, record and bring log to follow up appointment.She will continue on current medication, low carb/non concentrated sweet diet. - blood glucose meter kit and supplies KIT; Dispense based on patient and insurance preference. Use up to four times daily as directed.  Dispense: 1 each; Refill: 0   No follow-ups on file. 06/07/23 in clinic   [name] E Angelica Meas, NP

## 2023-06-07 ENCOUNTER — Ambulatory Visit: Payer: Self-pay | Admitting: Gerontology

## 2023-06-13 ENCOUNTER — Ambulatory Visit: Payer: Self-pay | Admitting: Gerontology

## 2023-06-13 ENCOUNTER — Other Ambulatory Visit: Payer: Self-pay

## 2023-06-13 VITALS — BP 130/80 | HR 75 | Ht 61.0 in | Wt 258.0 lb

## 2023-06-13 DIAGNOSIS — E1122 Type 2 diabetes mellitus with diabetic chronic kidney disease: Secondary | ICD-10-CM

## 2023-06-13 DIAGNOSIS — I10 Essential (primary) hypertension: Secondary | ICD-10-CM

## 2023-06-13 LAB — POCT GLYCOSYLATED HEMOGLOBIN (HGB A1C): Hemoglobin A1C: 7.3 % — AB (ref 4.0–5.6)

## 2023-06-13 LAB — GLUCOSE, POCT (MANUAL RESULT ENTRY): POC Glucose: 300 mg/dL — AB (ref 70–99)

## 2023-06-13 MED ORDER — AMLODIPINE BESYLATE 10 MG PO TABS
10.0000 mg | ORAL_TABLET | Freq: Every day | ORAL | 0 refills | Status: DC
  Filled 2023-06-13 – 2023-09-01 (×3): qty 90, 90d supply, fill #0

## 2023-06-13 NOTE — Patient Instructions (Signed)
Carbohydrate Counting for Diabetes Mellitus, Adult Carbohydrate counting is a method of keeping track of how many carbohydrates you eat. Eating carbohydrates increases the amount of sugar (glucose) in the blood. Counting how many carbohydrates you eat improves how well you manage your blood glucose. This, in turn, helps you manage your diabetes. Carbohydrates are measured in grams (g) per serving. It is important to know how many carbohydrates (in grams or by serving size) you can have in each meal. This is different for every person. A dietitian can help you make a meal plan and calculate how many carbohydrates you should have at each meal and snack. What foods contain carbohydrates? Carbohydrates are found in the following foods: Grains, such as breads and cereals. Dried beans and soy products. Starchy vegetables, such as potatoes, peas, and corn. Fruit and fruit juices. Milk and yogurt. Sweets and snack foods, such as cake, cookies, candy, chips, and soft drinks. How do I count carbohydrates in foods? There are two ways to count carbohydrates in food. You can read food labels or learn standard serving sizes of foods. You can use either of these methods or a combination of both. Using the Nutrition Facts label The Nutrition Facts list is included on the labels of almost all packaged foods and beverages in the [place]. It includes: The serving size. Information about nutrients in each serving, including the grams of carbohydrate per serving. To use the Nutrition Facts, decide how many servings you will have. Then, multiply the number of servings by the number of carbohydrates per serving. The resulting number is the total grams of carbohydrates that you will be having. Learning the standard serving sizes of foods When you eat carbohydrate foods that are not packaged or do not include Nutrition Facts on the label, you need to measure the servings in order to count the grams of  carbohydrates. Measure the foods that you will eat with a food scale or measuring cup, if needed. Decide how many standard-size servings you will eat. Multiply the number of servings by 15. For foods that contain carbohydrates, one serving equals 15 g of carbohydrates. For example, if you eat 2 cups or 10 oz (300 g) of strawberries, you will have eaten 2 servings and 30 g of carbohydrates (2 servings x 15 g = 30 g). For foods that have more than one food mixed, such as soups and casseroles, you must count the carbohydrates in each food that is included. The following list contains standard serving sizes of common carbohydrate-rich foods. Each of these servings has about 15 g of carbohydrates: 1 slice of bread. 1 six-inch (15 cm) tortilla. ? cup or 2 oz (53 g) cooked rice or pasta.  cup or 3 oz (85 g) cooked or canned, drained and rinsed beans or lentils.  cup or 3 oz (85 g) starchy vegetable, such as peas, corn, or squash.  cup or 4 oz (120 g) hot cereal.  cup or 3 oz (85 g) boiled or mashed potatoes, or  or 3 oz (85 g) of a large baked potato.  cup or 4 fl oz (118 mL) fruit juice. 1 cup or 8 fl oz (237 mL) milk. 1 small or 4 oz (106 g) apple.  or 2 oz (63 g) of a medium banana. 1 cup or 5 oz (150 g) strawberries. 3 cups or 1 oz (28.3 g) popped popcorn. What is an example of carbohydrate counting? To calculate the grams of carbohydrates in this sample meal, follow the steps  shown below. Sample meal 3 oz (85 g) chicken breast. ? cup or 4 oz (106 g) brown rice.  cup or 3 oz (85 g) corn. 1 cup or 8 fl oz (237 mL) milk. 1 cup or 5 oz (150 g) strawberries with sugar-free whipped topping. Carbohydrate calculation Identify the foods that contain carbohydrates: Rice. Corn. Milk. Strawberries. Calculate how many servings you have of each food: 2 servings rice. 1 serving corn. 1 serving milk. 1 serving strawberries. Multiply each number of servings by 15 g: 2 servings rice x 15  g = 30 g. 1 serving corn x 15 g = 15 g. 1 serving milk x 15 g = 15 g. 1 serving strawberries x 15 g = 15 g. Add together all of the amounts to find the total grams of carbohydrates eaten: 30 g + 15 g + 15 g + 15 g = 75 g of carbohydrates total. What are tips for following this plan? Shopping Develop a meal plan and then make a shopping list. Buy fresh and frozen vegetables, fresh and frozen fruit, dairy, eggs, beans, lentils, and whole grains. Look at food labels. Choose foods that have more fiber and less sugar. Avoid processed foods and foods with added sugars. Meal planning Aim to have the same number of grams of carbohydrates at each meal and for each snack time. Plan to have regular, balanced meals and snacks. Where to find more information American Diabetes Association: diabetes.org Centers for Disease Control and Prevention: TonerPromos.no Academy of Nutrition and Dietetics: eatright.org Association of Diabetes Care & Education Specialists: diabeteseducator.org Summary Carbohydrate counting is a method of keeping track of how many carbohydrates you eat. Eating carbohydrates increases the amount of sugar (glucose) in your blood. Counting how many carbohydrates you eat improves how well you manage your blood glucose. This helps you manage your diabetes. A dietitian can help you make a meal plan and calculate how many carbohydrates you should have at each meal and snack. This information is not intended to replace advice given to you by your health care provider. Make sure you discuss any questions you have with your health care provider. Document Revised: 12/03/2019 Document Reviewed: 12/04/2019 Elsevier Patient Education  2024 Elsevier Inc.DASH Eating Plan DASH stands for Dietary Approaches to Stop Hypertension. The DASH eating plan is a healthy eating plan that has been shown to: Lower high blood pressure (hypertension). Reduce your risk for type 2 diabetes, heart disease, and  stroke. Help with weight loss. What are tips for following this plan? Reading food labels Check food labels for the amount of salt (sodium) per serving. Choose foods with less than 5 percent of the Daily Value (DV) of sodium. In general, foods with less than 300 milligrams (mg) of sodium per serving fit into this eating plan. To find whole grains, look for the word "whole" as the first word in the ingredient list. Shopping Buy products labeled as "low-sodium" or "no salt added." Buy fresh foods. Avoid canned foods and pre-made or frozen meals. Cooking Try not to add salt when you cook. Use salt-free seasonings or herbs instead of table salt or sea salt. Check with your health care provider or pharmacist before using salt substitutes. Do not fry foods. Cook foods in healthy ways, such as baking, boiling, grilling, roasting, or broiling. Cook using oils that are good for your heart. These include olive, canola, avocado, soybean, and sunflower oil. Meal planning  Eat a balanced diet. This should include: 4 or more servings of fruits  and 4 or more servings of vegetables each day. Try to fill half of your plate with fruits and vegetables. 6-8 servings of whole grains each day. 6 or less servings of lean meat, poultry, or fish each day. 1 oz is 1 serving. A 3 oz (85 g) serving of meat is about the same size as the palm of your hand. One egg is 1 oz (28 g). 2-3 servings of low-fat dairy each day. One serving is 1 cup (237 mL). 1 serving of nuts, seeds, or beans 5 times each week. 2-3 servings of heart-healthy fats. Healthy fats called omega-3 fatty acids are found in foods such as walnuts, flaxseeds, fortified milks, and eggs. These fats are also found in cold-water fish, such as sardines, salmon, and mackerel. Limit how much you eat of: Canned or prepackaged foods. Food that is high in trans fat, such as fried foods. Food that is high in saturated fat, such as fatty meat. Desserts and other  sweets, sugary drinks, and other foods with added sugar. Full-fat dairy products. Do not salt foods before eating. Do not eat more than 4 egg yolks a week. Try to eat at least 2 vegetarian meals a week. Eat more home-cooked food and less restaurant, buffet, and fast food. Lifestyle When eating at a restaurant, ask if your food can be made with less salt or no salt. If you drink alcohol: Limit how much you have to: 0-1 drink a day if you are female. 0-2 drinks a day if you are female. Know how much alcohol is in your drink. In the [place] one drink is one 12 oz bottle of beer (355 mL), one 5 oz glass of wine (148 mL), or one 1 oz glass of hard liquor (44 mL). General information Avoid eating more than 2,300 mg of salt a day. If you have hypertension, you may need to reduce your sodium intake to 1,500 mg a day. Work with your provider to stay at a healthy body weight or lose weight. Ask what the best weight range is for you. On most days of the week, get at least 30 minutes of exercise that causes your heart to beat faster. This may include walking, swimming, or biking. Work with your provider or dietitian to adjust your eating plan to meet your specific calorie needs. What foods should I eat? Fruits All fresh, dried, or frozen fruit. Canned fruits that are in their natural juice and do not have sugar added to them. Vegetables Fresh or frozen vegetables that are raw, steamed, roasted, or grilled. Low-sodium or reduced-sodium tomato and vegetable juice. Low-sodium or reduced-sodium tomato sauce and tomato paste. Low-sodium or reduced-sodium canned vegetables. Grains Whole-grain or whole-wheat bread. Whole-grain or whole-wheat pasta. Brown rice. Orpah Cobb. Bulgur. Whole-grain and low-sodium cereals. Pita bread. Low-fat, low-sodium crackers. Whole-wheat flour tortillas. Meats and other proteins Skinless chicken or [place]. Ground chicken or [place]. Pork with fat trimmed off. Fish and seafood.  Egg whites. Dried beans, peas, or lentils. Unsalted nuts, nut butters, and seeds. Unsalted canned beans. Lean cuts of beef with fat trimmed off. Low-sodium, lean precooked or cured meat, such as sausages or meat loaves. Dairy Low-fat (1%) or fat-free (skim) milk. Reduced-fat, low-fat, or fat-free cheeses. Nonfat, low-sodium ricotta or cottage cheese. Low-fat or nonfat yogurt. Low-fat, low-sodium cheese. Fats and oils Soft margarine without trans fats. Vegetable oil. Reduced-fat, low-fat, or light mayonnaise and salad dressings (reduced-sodium). Canola, safflower, olive, avocado, soybean, and sunflower oils. Avocado. Seasonings and condiments Herbs. Spices. Seasoning  mixes without salt. Other foods Unsalted popcorn and pretzels. Fat-free sweets. The items listed above may not be all the foods and drinks you can have. Talk to a dietitian to learn more. What foods should I avoid? Fruits Canned fruit in a light or heavy syrup. Fried fruit. Fruit in cream or butter sauce. Vegetables Creamed or fried vegetables. Vegetables in a cheese sauce. Regular canned vegetables that are not marked as low-sodium or reduced-sodium. Regular canned tomato sauce and paste that are not marked as low-sodium or reduced-sodium. Regular tomato and vegetable juices that are not marked as low-sodium or reduced-sodium. Rosita Fire. Olives. Grains Baked goods made with fat, such as croissants, muffins, or some breads. Dry pasta or rice meal packs. Meats and other proteins Fatty cuts of meat. Ribs. Fried meat. Tomasa Blase. Bologna, salami, and other precooked or cured meats, such as sausages or meat loaves, that are not lean and low in sodium. Fat from the back of a pig (fatback). Bratwurst. Salted nuts and seeds. Canned beans with added salt. Canned or smoked fish. Whole eggs or egg yolks. Chicken or [place] with skin. Dairy Whole or 2% milk, cream, and half-and-half. Whole or full-fat cream cheese. Whole-fat or sweetened yogurt.  Full-fat cheese. Nondairy creamers. Whipped toppings. Processed cheese and cheese spreads. Fats and oils Butter. Stick margarine. Lard. Shortening. Ghee. Bacon fat. Tropical oils, such as coconut, palm kernel, or palm oil. Seasonings and condiments Onion salt, garlic salt, seasoned salt, table salt, and sea salt. Worcestershire sauce. Tartar sauce. Barbecue sauce. Teriyaki sauce. Soy sauce, including reduced-sodium soy sauce. Steak sauce. Canned and packaged gravies. Fish sauce. Oyster sauce. Cocktail sauce. Store-bought horseradish. Ketchup. Mustard. Meat flavorings and tenderizers. Bouillon cubes. Hot sauces. Pre-made or packaged marinades. Pre-made or packaged taco seasonings. Relishes. Regular salad dressings. Other foods Salted popcorn and pretzels. The items listed above may not be all the foods and drinks you should avoid. Talk to a dietitian to learn more. Where to find more information National Heart, Lung, and Blood Institute (NHLBI): BuffaloDryCleaner.gl American Heart Association (AHA): heart.org Academy of Nutrition and Dietetics: eatright.org National Kidney Foundation (NKF): kidney.org This information is not intended to replace advice given to you by your health care provider. Make sure you discuss any questions you have with your health care provider. Document Revised: 05/19/2022 Document Reviewed: 05/19/2022 Elsevier Patient Education  2024 [employer].

## 2023-06-13 NOTE — [facility] Notes (Signed)
Established Patient Office Visit  Subjective   Patient ID: [name], female    DOB: 02/03/1976  Age: 48 y.o. MRN: [id number]  Chief Complaint  Patient presents with   Follow-up    HPI  [name]   is a 48 year old female who history of Asthma,T2 Diabetes, Hypertension, Renal disorder on hemodialysis who presents for follow up visit. She states that she's compliant with her medication, denies side effects and continues to make healthy lifestyle changes.  Her HgbA1c checked during  decreased from 7.8% to 7.3%, blood glucose was 300 mg/dl, states that she ate Granola bar and drank lemon soda. She states that she checks her blood glucose bid, though didn't check it prior to going for her dialysis.  She denies hypo/hyperglycemic symptoms, peripheral neuropathy and performs daily foot checks. She had dialysis today and thrill and bruitt present to left arm. Overall, she states that she's doing well and offers no further complaint. She has active [employer] and today is her last day.  Review of Systems  Constitutional: Negative.   Respiratory: Negative.    Cardiovascular: Negative.   Genitourinary: Negative.   Neurological: Negative.   Psychiatric/Behavioral: Negative.        Objective:     BP 130/80 (BP Location: Right Arm, Patient Position: Sitting, Cuff Size: Large)   Pulse 75   Ht 5\' 1"  (1.549 m)   Wt 258 lb (117 kg)   LMP 05/17/2023 (Exact Date)   SpO2 96%   BMI 48.75 kg/m  BP Readings from Last 3 Encounters:  06/13/23 130/80  05/24/23 (!) 159/85  03/09/23 (!) 149/91   Wt Readings from Last 3 Encounters:  06/13/23 258 lb (117 kg)  05/24/23 267 lb 12.8 oz (121.5 kg)  02/09/22 260 lb (117.9 kg)      Physical Exam Constitutional:      Appearance: Normal appearance.  HENT:     Head: Normocephalic and atraumatic.     Mouth/Throat:     Mouth: Mucous membranes are moist.  Eyes:     Extraocular Movements: Extraocular movements  intact.     Pupils: Pupils are equal, round, and reactive to light.  Cardiovascular:     Rate and Rhythm: Normal rate and regular rhythm.     Pulses: Normal pulses.     Heart sounds: Normal heart sounds.  Pulmonary:     Effort: Pulmonary effort is normal.     Breath sounds: Normal breath sounds.  Skin:    General: Skin is warm.  Neurological:     General: No focal deficit present.     Mental Status: She is alert and oriented to person, place, and time.  Psychiatric:        Mood and Affect: Mood normal.        Behavior: Behavior normal.      Results for orders placed or performed in visit on 06/13/23  POCT Glucose (CBG)  Result Value Ref Range   POC Glucose 300 (A) 70 - 99 mg/dl  POCT HgB L8V  Result Value Ref Range   Hemoglobin A1C 7.3 (A) 4.0 - 5.6 %   HbA1c POC (<> result, manual entry)     HbA1c, POC (prediabetic range)     HbA1c, POC (controlled diabetic range)      Last CBC Lab Results  Component Value Date   WBC 7.0 08/25/2021   HGB 9.8 (L) 08/25/2021   HCT 29.6 (L) 08/25/2021   MCV 92.2 08/25/2021  MCH 30.5 08/25/2021   RDW 12.9 08/25/2021   PLT 315 08/25/2021   Last metabolic panel Lab Results  Component Value Date   GLUCOSE 156 (H) 08/25/2021   NA 135 08/25/2021   K 4.4 08/25/2021   CL 97 (L) 08/25/2021   CO2 26 08/25/2021   BUN 37 (H) 08/25/2021   CREATININE 6.98 (H) 08/25/2021   GFRNONAA 7 (L) 08/25/2021   CALCIUM 7.9 (L) 08/25/2021   PHOS 3.8 07/31/2021   PROT 8.0 08/25/2021   ALBUMIN 3.8 08/25/2021   BILITOT 0.7 08/25/2021   ALKPHOS 123 08/25/2021   AST 17 08/25/2021   ALT 16 08/25/2021   ANIONGAP 12 08/25/2021   Last lipids Lab Results  Component Value Date   CHOL 183 03/10/2021   HDL 46 03/10/2021   LDLCALC 109 (H) 03/10/2021   TRIG 162 (H) 03/10/2021   CHOLHDL 4.0 03/10/2021   Last hemoglobin A1c Lab Results  Component Value Date   HGBA1C 7.3 (A) 06/13/2023   Last thyroid functions Lab Results  Component Value Date    TSH 3.921 08/29/2020   Last vitamin D No results found for: "25OHVITD2", "25OHVITD3", "VD25OH" Last vitamin B12 and Folate No results found for: "VITAMINB12", "FOLATE"    The 10-year ASCVD risk score (Arnett DK, et al., 2019) is: 3.6%    Assessment & Plan:   1. Type 2 diabetes mellitus with chronic kidney disease on chronic dialysis, unspecified whether long term insulin use (HCC) (Primary) - Her HgbA1c was 7.3%, her diabetes is improving, and her goal HgbA1c is 7%. She will continue current medication , low carb/non concentrated sweet diet and exercise as tolerated. - POCT Glucose (CBG) - POCT HgB A1C  2. Essential hypertension - Her blood pressure is under control after dialysis, will continue current medication, DASH diet and exercise as tolerated. - amLODipine (NORVASC) 10 MG tablet; Take 1 tablet (10 mg total) by mouth daily.  Dispense: 90 tablet; Refill: 0   Today is likely her last visit because she has active [employer]. She was advised to find PCP, and [facility] wishes her well with her care. Return in about 13 weeks (around 09/12/2023), or if symptoms worsen or fail to improve.    Angelica Gotay [name], NP

## 2023-06-16 ENCOUNTER — Other Ambulatory Visit: Payer: Self-pay

## 2023-09-01 ENCOUNTER — Other Ambulatory Visit: Payer: Self-pay

## 2023-09-01 MED ORDER — CALCIUM ACETATE (PHOS BINDER) 667 MG PO CAPS
2001.0000 mg | ORAL_CAPSULE | Freq: Three times a day (TID) | ORAL | 11 refills | Status: AC
  Filled 2023-09-01: qty 270, 30d supply, fill #0
  Filled 2023-10-10: qty 270, 30d supply, fill #1
  Filled 2023-12-08: qty 270, 30d supply, fill #2
  Filled 2024-01-17: qty 120, 14d supply, fill #3
  Filled 2024-02-12: qty 120, 14d supply, fill #4
  Filled 2024-03-08: qty 120, 14d supply, fill #5
  Filled 2024-04-10: qty 120, 14d supply, fill #6

## 2023-09-05 ENCOUNTER — Other Ambulatory Visit
Admission: RE | Admit: 2023-09-05 | Discharge: 2023-09-05 | Disposition: A | Discharge status: Home / Self Care | Payer: Self-pay | Source: Ambulatory Visit | Attending: Nephrology | Admitting: Nephrology

## 2023-09-05 DIAGNOSIS — N186 End stage renal disease: Secondary | ICD-10-CM | POA: Insufficient documentation

## 2023-09-05 LAB — POTASSIUM: Potassium: 5.4 mmol/L — ABNORMAL HIGH (ref 3.5–5.1)

## 2023-09-12 ENCOUNTER — Ambulatory Visit: Payer: Self-pay | Admitting: Gerontology

## 2023-09-14 ENCOUNTER — Other Ambulatory Visit: Payer: Self-pay

## 2023-09-14 MED ORDER — FLUCONAZOLE 150 MG PO TABS
150.0000 mg | ORAL_TABLET | Freq: Every day | ORAL | 0 refills | Status: DC
  Filled 2023-09-14 – 2023-09-29 (×2): qty 1, 1d supply, fill #0

## 2023-09-26 ENCOUNTER — Other Ambulatory Visit: Payer: Self-pay

## 2023-09-29 ENCOUNTER — Other Ambulatory Visit: Payer: Self-pay

## 2023-10-10 ENCOUNTER — Other Ambulatory Visit: Payer: Self-pay | Admitting: Gerontology

## 2023-10-10 ENCOUNTER — Other Ambulatory Visit: Payer: Self-pay

## 2023-10-10 DIAGNOSIS — I10 Essential (primary) hypertension: Secondary | ICD-10-CM

## 2023-10-11 ENCOUNTER — Other Ambulatory Visit: Payer: Self-pay

## 2023-10-12 ENCOUNTER — Other Ambulatory Visit: Payer: Self-pay

## 2023-10-13 ENCOUNTER — Other Ambulatory Visit: Payer: Self-pay

## 2023-10-16 ENCOUNTER — Other Ambulatory Visit: Payer: Self-pay

## 2023-10-16 MED ORDER — AMLODIPINE BESYLATE 10 MG PO TABS
10.0000 mg | ORAL_TABLET | Freq: Every day | ORAL | 3 refills | Status: AC
  Filled 2024-01-19: qty 90, 90d supply, fill #0
  Filled 2024-06-05: qty 90, 90d supply, fill #1

## 2023-10-16 MED ORDER — FUROSEMIDE 80 MG PO TABS
80.0000 mg | ORAL_TABLET | Freq: Every day | ORAL | 3 refills | Status: AC
  Filled 2023-10-16: qty 90, 90d supply, fill #0
  Filled 2024-01-19: qty 90, 90d supply, fill #1

## 2023-10-17 ENCOUNTER — Other Ambulatory Visit: Payer: Self-pay

## 2023-10-17 MED ORDER — FLUCONAZOLE 150 MG PO TABS
150.0000 mg | ORAL_TABLET | Freq: Every day | ORAL | 0 refills | Status: AC
  Filled 2023-10-17: qty 1, 1d supply, fill #0

## 2023-10-26 ENCOUNTER — Other Ambulatory Visit: Payer: Self-pay

## 2023-10-26 MED ORDER — CIPROFLOXACIN HCL 500 MG PO TABS
500.0000 mg | ORAL_TABLET | Freq: Every day | ORAL | 0 refills | Status: DC
  Filled 2023-10-26: qty 7, 7d supply, fill #0

## 2023-12-08 ENCOUNTER — Other Ambulatory Visit: Payer: Self-pay

## 2024-01-04 ENCOUNTER — Ambulatory Visit: Payer: Self-pay | Admitting: Gerontology

## 2024-01-04 ENCOUNTER — Other Ambulatory Visit: Payer: Self-pay

## 2024-01-04 ENCOUNTER — Telehealth: Payer: Self-pay | Admitting: Gerontology

## 2024-01-04 VITALS — BP 167/82 | HR 73 | Ht 62.0 in | Wt 270.0 lb

## 2024-01-04 DIAGNOSIS — I10 Essential (primary) hypertension: Secondary | ICD-10-CM

## 2024-01-04 DIAGNOSIS — E1122 Type 2 diabetes mellitus with diabetic chronic kidney disease: Secondary | ICD-10-CM

## 2024-01-04 DIAGNOSIS — Z8709 Personal history of other diseases of the respiratory system: Secondary | ICD-10-CM | POA: Insufficient documentation

## 2024-01-04 DIAGNOSIS — Z8719 Personal history of other diseases of the digestive system: Secondary | ICD-10-CM

## 2024-01-04 LAB — GLUCOSE, POCT (MANUAL RESULT ENTRY): POC Glucose: 486 mg/dL — AB (ref 70–99)

## 2024-01-04 LAB — POCT GLYCOSYLATED HEMOGLOBIN (HGB A1C): Hemoglobin A1C: 10.3 % — AB (ref 4.0–5.6)

## 2024-01-04 MED ORDER — INSULIN PEN NEEDLE 32G X 4 MM MISC
1.0000 | Freq: Every day | 2 refills | Status: AC
  Filled 2024-01-04: qty 100, 100d supply, fill #0

## 2024-01-04 MED ORDER — BASAGLAR KWIKPEN 100 UNIT/ML ~~LOC~~ SOPN
7.0000 [IU] | PEN_INJECTOR | Freq: Every day | SUBCUTANEOUS | 0 refills | Status: DC
  Filled 2024-01-04: qty 15, 150d supply, fill #0

## 2024-01-04 MED ORDER — ESOMEPRAZOLE MAGNESIUM 20 MG PO CPDR
20.0000 mg | DELAYED_RELEASE_CAPSULE | Freq: Every day | ORAL | 0 refills | Status: AC
  Filled 2024-01-04 – 2024-01-05 (×2): qty 30, 30d supply, fill #0

## 2024-01-04 MED ORDER — ALBUTEROL SULFATE HFA 108 (90 BASE) MCG/ACT IN AERS
1.0000 | INHALATION_SPRAY | Freq: Four times a day (QID) | RESPIRATORY_TRACT | 3 refills | Status: AC | PRN
  Filled 2024-01-04 – 2024-01-05 (×2): qty 6.7, 50d supply, fill #0

## 2024-01-04 NOTE — Progress Notes (Unsigned)
 Established Patient Office Visit  Subjective   Patient ID: [name], female    DOB: July 23, 1975  Age: 48 y.o. MRN: [id number]  No chief complaint on file.   HPI  [name]   is a 48 year old female who history of Asthma,T2 Diabetes, Hypertension, Renal disorder on hemodialysis who presents for follow up visit . She was last seen at the clinic 6months ago and presents today stating that she needs her diabetes medication to be refilled. Her HgbA1c checked during visit increased from  8.4% to 10.3% and her blood glucose during visit was 486 mg/dl.  She denies hypo-/hyperglycemic symptoms, peripheral neuropathy and perform temperature.  She states that she she was out of her Ozempic 1 month ago and out of the lancets and test strips for 1 month ago to. She goes to hemodialysis T [facility] through left upper arm fistula and has positive thrill and bruitt . Her blood pressure was elevated at 167/82, states that she came to the clinic from dialysis and has not taken her blood pressure medication.  She denies headache, chest pain, palpitation, lightheadedness, vision changes. She also c/o acid reflux and was taking Omeprazole  but has been out.  Overall, she states that she was doing well and offers no further complaints.  Review of Systems  Constitutional: Negative.   Eyes: Negative.   Respiratory: Negative.    Cardiovascular: Negative.   Skin: Negative.   Neurological: Negative.   Endo/Heme/Allergies: Negative.   Psychiatric/Behavioral: Negative.        Objective:     BP (!) 167/82 (BP Location: Right Arm, Patient Position: Sitting, Cuff Size: Large)   Pulse 73   Ht 5' 2 (1.575 m)   Wt 270 lb (122.5 kg)   BMI 49.38 kg/m  BP Readings from Last 3 Encounters:  01/04/24 (!) 148/83  01/04/24 (!) 167/82  06/13/23 130/80   Wt Readings from Last 3 Encounters:  01/04/24 270 lb 12.8 oz (122.8 kg)  01/04/24 270 lb (122.5 kg)  06/13/23 258 lb (117 kg)      Physical  Exam HENT:     Head: Normocephalic and atraumatic.     Mouth/Throat:     Mouth: Mucous membranes are moist.     Pharynx: Oropharynx is clear.  Eyes:     Extraocular Movements: Extraocular movements intact.     Conjunctiva/sclera: Conjunctivae normal.     Pupils: Pupils are equal, round, and reactive to light.  Cardiovascular:     Rate and Rhythm: Normal rate and regular rhythm.     Pulses: Normal pulses.     Heart sounds: Normal heart sounds.  Pulmonary:     Effort: Pulmonary effort is normal.     Breath sounds: Normal breath sounds.  Skin:    General: Skin is warm.     Capillary Refill: Capillary refill takes less than 2 seconds.  Neurological:     General: No focal deficit present.     Mental Status: She is alert and oriented to person, place, and time.  Psychiatric:        Mood and Affect: Mood normal.        Behavior: Behavior normal.        Thought Content: Thought content normal.        Judgment: Judgment normal.      Results for orders placed or performed in visit on 01/04/24  Comp Met (CMET)  Result Value Ref Range   Glucose 399 (H) 70 - 99 mg/dL  BUN 26 (H) 6 - 24 mg/dL   Creatinine, Ser 3.46 (H) 0.57 - 1.00 mg/dL   eGFR 7 (L) >40 fO/fpw/8.26   BUN/Creatinine Ratio 4 (L) 9 - 23   Sodium 135 134 - 144 mmol/L   Potassium 4.4 3.5 - 5.2 mmol/L   Chloride 91 (L) 96 - 106 mmol/L   CO2 25 20 - 29 mmol/L   Calcium  9.4 8.7 - 10.2 mg/dL   Total Protein 6.8 6.0 - 8.5 g/dL   Albumin  4.2 3.9 - 4.9 g/dL   Globulin, Total 2.6 1.5 - 4.5 g/dL   Bilirubin Total <9.7 0.0 - 1.2 mg/dL   Alkaline Phosphatase 102 44 - 121 IU/L   AST 12 0 - 40 IU/L   ALT 12 0 - 32 IU/L  Results for orders placed or performed in visit on 01/04/24  POCT Glucose (CBG)  Result Value Ref Range   POC Glucose 486 (A) 70 - 99 mg/dl  POCT HgB J8R  Result Value Ref Range   Hemoglobin A1C 10.3 (A) 4.0 - 5.6 %   HbA1c POC (<> result, manual entry)     HbA1c, POC (prediabetic range)     HbA1c, POC  (controlled diabetic range)      Last CBC Lab Results  Component Value Date   WBC 7.0 08/25/2021   HGB 9.8 (L) 08/25/2021   HCT 29.6 (L) 08/25/2021   MCV 92.2 08/25/2021   MCH 30.5 08/25/2021   RDW 12.9 08/25/2021   PLT 315 08/25/2021   Last metabolic panel Lab Results  Component Value Date   GLUCOSE 399 (H) 01/04/2024   NA 135 01/04/2024   K 4.4 01/04/2024   CL 91 (L) 01/04/2024   CO2 25 01/04/2024   BUN 26 (H) 01/04/2024   CREATININE 6.53 (H) 01/04/2024   GFRNONAA 7 (L) 08/25/2021   CALCIUM  9.4 01/04/2024   PHOS 3.8 07/31/2021   PROT 6.8 01/04/2024   ALBUMIN  4.2 01/04/2024   LABGLOB 2.6 01/04/2024   BILITOT <0.2 01/04/2024   ALKPHOS 102 01/04/2024   AST 12 01/04/2024   ALT 12 01/04/2024   ANIONGAP 12 08/25/2021   Last lipids Lab Results  Component Value Date   CHOL 183 03/10/2021   HDL 46 03/10/2021   LDLCALC 109 (H) 03/10/2021   TRIG 162 (H) 03/10/2021   CHOLHDL 4.0 03/10/2021   Last hemoglobin A1c Lab Results  Component Value Date   HGBA1C 10.3 (A) 01/04/2024   Last thyroid  functions Lab Results  Component Value Date   TSH 3.921 08/29/2020   Last vitamin D No results found for: 25OHVITD2, 25OHVITD3, VD25OH Last vitamin B12 and Folate No results found for: VITAMINB12, FOLATE    The 10-year ASCVD risk score (Arnett DK, et al., 2019) is: 5.9%    Assessment & Plan:  1. Type 2 diabetes mellitus with chronic kidney disease on chronic dialysis, unspecified whether long term insulin  use (HCC) (Primary) -Her diabetes is not under control, and hemoglobin A1c was at 10.3%. She was started on Basaglar  7 units at bedtime due to her kidney function. She was advised to check fasting blood glucose daily and 2 hours after eating her heaviest meal if meal, because meal time insulin  might be ordered. She was advised to bring log to follow up appointment. - Comp Met (CMET); Future - POCT Glucose (CBG); Future - POCT Glucose (CBG) - POCT HgB A1C;  Future - POCT HgB A1C - Insulin  Glargine (BASAGLAR  KWIKPEN) 100 UNIT/ML; Inject 7 Units into the skin daily.  If Blood glucose is > 400 mg/dl go to the E  Dispense: 15 mL; Refill: 0 - Insulin  Pen Needle 32G X 4 MM MISC; USE AS DIRECTED ONCE DAILY AT BEDTIME.  Dispense: 100 each; Refill: 2  2. Essential hypertension - Her blood pressure is not under control, was advised to take her medications, DASH diet and exercise as tolerated.  3. History of asthma - Her breathing is stable and she will use albuterol  as needed. - albuterol  (VENTOLIN  HFA) 108 (90 Base) MCG/ACT inhaler; Inhale 1 puff into the lungs every 6 (six) hours as needed for wheezing or shortness of breath.  Dispense: 6.7 g; Refill: 3  4. History of gastroesophageal reflux (GERD) - Her Omeprazole  was changed to Nexium  due to cost. She was educated on medication side effects and notify clinic. -Avoid spicy, fatty and fried food -Avoid sodas and sour juices -Avoid heavy meals -Avoid eating 4 hours before bedtime -Elevate head of bed at night - esomeprazole  (NEXIUM ) 20 MG capsule; Take 1 capsule (20 mg total) by mouth daily at 12 noon.  Dispense: 30 capsule; Refill: 0     Return in about 2 weeks (around 01/18/2024), or if symptoms worsen or fail to improve.    [name] E Angelica Catlin, NP

## 2024-01-04 NOTE — Patient Instructions (Signed)
 DASH Eating Plan DASH stands for Dietary Approaches to Stop Hypertension. The DASH eating plan is a healthy eating plan that has been shown to: Lower high blood pressure (hypertension). Reduce your risk for type 2 diabetes, heart disease, and stroke. Help with weight loss. What are tips for following this plan? Reading food labels Check food labels for the amount of salt (sodium) per serving. Choose foods with less than 5 percent of the Daily Value (DV) of sodium. In general, foods with less than 300 milligrams (mg) of sodium per serving fit into this eating plan. To find whole grains, look for the word whole as the first word in the ingredient list. Shopping Buy products labeled as low-sodium or no salt added. Buy fresh foods. Avoid canned foods and pre-made or frozen meals. Cooking Try not to add salt when you cook. Use salt-free seasonings or herbs instead of table salt or sea salt. Check with your health care provider or pharmacist before using salt substitutes. Do not fry foods. Cook foods in healthy ways, such as baking, boiling, grilling, roasting, or broiling. Cook using oils that are good for your heart. These include olive, canola, avocado, soybean, and sunflower oil. Meal planning  Eat a balanced diet. This should include: 4 or more servings of fruits and 4 or more servings of vegetables each day. Try to fill half of your plate with fruits and vegetables. 6-8 servings of whole grains each day. 6 or less servings of lean meat, poultry, or fish each day. 1 oz is 1 serving. A 3 oz (85 g) serving of meat is about the same size as the palm of your hand. One egg is 1 oz (28 g). 2-3 servings of low-fat dairy each day. One serving is 1 cup (237 mL). 1 serving of nuts, seeds, or beans 5 times each week. 2-3 servings of heart-healthy fats. Healthy fats called omega-3 fatty acids are found in foods such as walnuts, flaxseeds, fortified milks, and eggs. These fats are also found in  cold-water fish, such as sardines, salmon, and mackerel. Limit how much you eat of: Canned or prepackaged foods. Food that is high in trans fat, such as fried foods. Food that is high in saturated fat, such as fatty meat. Desserts and other sweets, sugary drinks, and other foods with added sugar. Full-fat dairy products. Do not salt foods before eating. Do not eat more than 4 egg yolks a week. Try to eat at least 2 vegetarian meals a week. Eat more home-cooked food and less restaurant, buffet, and fast food. Lifestyle When eating at a restaurant, ask if your food can be made with less salt or no salt. If you drink alcohol: Limit how much you have to: 0-1 drink a day if you are female. 0-2 drinks a day if you are female. Know how much alcohol is in your drink. In the [place] one drink is one 12 oz bottle of beer (355 mL), one 5 oz glass of wine (148 mL), or one 1 oz glass of hard liquor (44 mL). General information Avoid eating more than 2,300 mg of salt a day. If you have hypertension, you may need to reduce your sodium intake to 1,500 mg a day. Work with your provider to stay at a healthy body weight or lose weight. Ask what the best weight range is for you. On most days of the week, get at least 30 minutes of exercise that causes your heart to beat faster. This may include walking, swimming, or  biking. Work with your provider or dietitian to adjust your eating plan to meet your specific calorie needs. What foods should I eat? Fruits All fresh, dried, or frozen fruit. Canned fruits that are in their natural juice and do not have sugar added to them. Vegetables Fresh or frozen vegetables that are raw, steamed, roasted, or grilled. Low-sodium or reduced-sodium tomato and vegetable juice. Low-sodium or reduced-sodium tomato sauce and tomato paste. Low-sodium or reduced-sodium canned vegetables. Grains Whole-grain or whole-wheat bread. Whole-grain or whole-wheat pasta. Brown rice. [place]. Bulgur. Whole-grain and low-sodium cereals. Pita bread. Low-fat, low-sodium crackers. Whole-wheat flour tortillas. Meats and other proteins Skinless chicken or [place]. Ground chicken or [place]. Pork with fat trimmed off. Fish and seafood. Egg whites. Dried beans, peas, or lentils. Unsalted nuts, nut butters, and seeds. Unsalted canned beans. Lean cuts of beef with fat trimmed off. Low-sodium, lean precooked or cured meat, such as sausages or meat loaves. Dairy Low-fat (1%) or fat-free (skim) milk. Reduced-fat, low-fat, or fat-free cheeses. Nonfat, low-sodium ricotta or cottage cheese. Low-fat or nonfat yogurt. Low-fat, low-sodium cheese. Fats and oils Soft margarine without trans fats. Vegetable oil. Reduced-fat, low-fat, or light mayonnaise and salad dressings (reduced-sodium). Canola, safflower, olive, avocado, soybean, and sunflower oils. Avocado. Seasonings and condiments Herbs. Spices. Seasoning mixes without salt. Other foods Unsalted popcorn and pretzels. Fat-free sweets. The items listed above may not be all the foods and drinks you can have. Talk to a dietitian to learn more. What foods should I avoid? Fruits Canned fruit in a light or heavy syrup. Fried fruit. Fruit in cream or butter sauce. Vegetables Creamed or fried vegetables. Vegetables in a cheese sauce. Regular canned vegetables that are not marked as low-sodium or reduced-sodium. Regular canned tomato sauce and paste that are not marked as low-sodium or reduced-sodium. Regular tomato and vegetable juices that are not marked as low-sodium or reduced-sodium. Dene. Olives. Grains Baked goods made with fat, such as croissants, muffins, or some breads. Dry pasta or rice meal packs. Meats and other proteins Fatty cuts of meat. Ribs. Fried meat. [place]. Bologna, salami, and other precooked or cured meats, such as sausages or meat loaves, that are not lean and low in sodium. Fat from the back of a pig (fatback). Bratwurst.  Salted nuts and seeds. Canned beans with added salt. Canned or smoked fish. Whole eggs or egg yolks. Chicken or [place] with skin. Dairy Whole or 2% milk, cream, and half-and-half. Whole or full-fat cream cheese. Whole-fat or sweetened yogurt. Full-fat cheese. Nondairy creamers. Whipped toppings. Processed cheese and cheese spreads. Fats and oils Butter. Stick margarine. Lard. Shortening. Ghee. Bacon fat. Tropical oils, such as coconut, palm kernel, or palm oil. Seasonings and condiments Onion salt, garlic salt, seasoned salt, table salt, and sea salt. Worcestershire sauce. Tartar sauce. Barbecue sauce. Teriyaki sauce. Soy sauce, including reduced-sodium soy sauce. Steak sauce. Canned and packaged gravies. Fish sauce. Oyster sauce. Cocktail sauce. Store-bought horseradish. Ketchup. Mustard. Meat flavorings and tenderizers. Bouillon cubes. Hot sauces. Pre-made or packaged marinades. Pre-made or packaged taco seasonings. Relishes. Regular salad dressings. Other foods Salted popcorn and pretzels. The items listed above may not be all the foods and drinks you should avoid. Talk to a dietitian to learn more. Where to find more information National Heart, Lung, and Blood Institute (NHLBI): BuffaloDryCleaner.gl American Heart Association (AHA): heart.org Academy of Nutrition and Dietetics: eatright.org National Kidney Foundation (NKF): kidney.org This information is not intended to replace advice given to you by your health care provider. Make sure  you discuss any questions you have with your health care provider. Document Revised: 05/19/2022 Document Reviewed: 05/19/2022 Elsevier Patient Education  2024 Elsevier Inc.Diabetes: Carbohydrate Counting for Adults Carbohydrate counting is a method of keeping track of how many carbohydrates you eat. Eating carbohydrates increases the amount of sugar, also called glucose, in your blood. By counting how many carbohydrates you eat, you can improve how well you manage  your blood sugar. This, in turn, helps you manage your diabetes. Carbohydrates are measured in grams (g) per serving. It's important to know how many carbohydrates (in grams or by serving size) you can have in each meal. This is different for every person. A dietitian can help you make a meal plan and calculate how many carbohydrates you should have at each meal and snack. What foods contain carbohydrates? Carbohydrates are found in these foods: Grains, such as breads and cereals. Dried beans and soy products. Starchy vegetables, such as potatoes, peas, and corn. Fruit and fruit juices. Milk and yogurt. Sweets and snack foods like cake, cookies, candy, chips, and soft drinks. How do I count carbohydrates in foods? There are two ways to count carbohydrates in food. You can read food labels or learn standard serving sizes of foods. You can use either of these methods or a combination of both. Using the Nutrition Facts label The Nutrition Facts list is included on the labels of almost all packaged foods and drinks in the [place] It includes: The serving size. Information about nutrients in each serving. This includes the grams of carbohydrate per serving. To use the Nutrition Facts, decide how many servings you will have. Then, multiply the number of servings by the number of carbohydrates per serving. The resulting number is the total grams of carbohydrates that you'll be having. Learning the standard serving sizes of foods When you eat carbohydrate foods that aren't packaged or don't include Nutrition Facts on the label, you need to measure the servings in order to count the grams of carbohydrates. Measure the foods that you'll eat with a food scale or measuring cup, if needed. Decide how many standard-size servings you'll eat. Multiply the number of servings by 15. For foods that contain carbohydrates, one serving equals 15 g of carbohydrates. For example, if you eat 2 cups or 10 oz (300 g) of  strawberries, you'll have eaten 2 servings and 30 g of carbohydrates (2 servings x 15 g = 30 g). For foods that have more than one food mixed, such as soups and casseroles, you must count the carbohydrates in each food that's included. Here's a list of standard serving sizes for common carbohydrate-rich foods. Each of these servings has about 15 g of carbohydrates: 1 slice of bread. 1 six-inch (15 cm) tortilla. ? cup or 2 oz (53 g) of cooked rice or pasta.  cup or 3 oz (85 g) of cooked or canned, drained, and rinsed beans or lentils.  cup or 3 oz (85 g) of a starchy vegetable, such as peas, corn, or squash.  cup or 4 oz (120 g) of hot cereal.  cup or 3 oz (85 g) of boiled or mashed potatoes, or  or 3 oz (85 g) of a large baked potato.  cup or 4 fl oz (118 mL) of fruit juice. 1 cup or 8 fl oz (237 mL) of milk. 1 small or 4 oz (106 g) apple.  or 2 oz (63 g) of a medium banana. 1 cup or 5 oz (150 g) of strawberries. 3 cups  or 1 oz (28.3 g) of popped popcorn. What is an example of carbohydrate counting? To calculate the grams of carbohydrates in this sample meal, follow the steps below. Sample meal 3 oz (85 g) chicken breast. ? cup or 4 oz (106 g) of brown rice.  cup or 3 oz (85 g) of corn. 1 cup or 8 fl oz (237 mL) of milk. 1 cup or 5 oz (150 g) of strawberries with sugar-free whipped topping. Carbohydrate calculation Identify the foods that have carbohydrates: Rice. Corn. Milk. Strawberries. Calculate how many servings you have of each food: 2 servings of rice. 1 serving of corn. 1 serving of milk. 1 serving of strawberries. Multiply each number of servings by 15 g: 2 servings of rice x 15 g = 30 g. 1 serving of corn x 15 g = 15 g. 1 serving of milk x 15 g = 15 g. 1 serving of strawberries x 15 g = 15 g. Add together all of the amounts to find the total grams of carbohydrates eaten: 30 g + 15 g + 15 g + 15 g = 75 g of carbohydrates total. Where to find more  information To learn more, go to: American Diabetes Association at diabetes.org. Click Search and type carb counting. Find the link you need. Centers for Disease Control and Prevention at TonerPromos.no. Click Search and type diabetes. Find the link you need. Academy of Nutrition and Dietetics: eatright.org This information is not intended to replace advice given to you by your health care provider. Make sure you discuss any questions you have with your health care provider. Document Revised: 04/19/2023 Document Reviewed: 04/19/2023 Elsevier Patient Education  2025 [employer].

## 2024-01-05 ENCOUNTER — Other Ambulatory Visit: Payer: Self-pay

## 2024-01-05 LAB — COMPREHENSIVE METABOLIC PANEL WITH GFR
ALT: 12 IU/L (ref 0–32)
AST: 12 IU/L (ref 0–40)
Albumin: 4.2 g/dL (ref 3.9–4.9)
Alkaline Phosphatase: 102 IU/L (ref 44–121)
BUN/Creatinine Ratio: 4 — ABNORMAL LOW (ref 9–23)
BUN: 26 mg/dL — ABNORMAL HIGH (ref 6–24)
Bilirubin Total: <0.2 mg/dL (ref 0.0–1.2)
CO2: 25 mmol/L (ref 20–29)
Calcium: 9.4 mg/dL (ref 8.7–10.2)
Chloride: 91 mmol/L — ABNORMAL LOW (ref 96–106)
Creatinine, Ser: 6.53 mg/dL — ABNORMAL HIGH (ref 0.57–1.00)
Globulin, Total: 2.6 g/dL (ref 1.5–4.5)
Glucose: 399 mg/dL — ABNORMAL HIGH (ref 70–99)
Potassium: 4.4 mmol/L (ref 3.5–5.2)
Sodium: 135 mmol/L (ref 134–144)
Total Protein: 6.8 g/dL (ref 6.0–8.5)
eGFR: 7 mL/min/1.73 — ABNORMAL LOW (ref >59)

## 2024-01-09 ENCOUNTER — Other Ambulatory Visit: Payer: Self-pay

## 2024-01-09 MED ORDER — FLUCONAZOLE 100 MG PO TABS
100.0000 mg | ORAL_TABLET | Freq: Every day | ORAL | 0 refills | Status: DC
  Filled 2024-01-09: qty 2, 2d supply, fill #0

## 2024-01-10 ENCOUNTER — Other Ambulatory Visit: Payer: Self-pay

## 2024-01-12 ENCOUNTER — Other Ambulatory Visit: Payer: Self-pay

## 2024-01-17 ENCOUNTER — Other Ambulatory Visit: Payer: Self-pay

## 2024-01-18 ENCOUNTER — Other Ambulatory Visit: Payer: Self-pay

## 2024-01-18 ENCOUNTER — Ambulatory Visit: Payer: Self-pay

## 2024-01-18 MED ORDER — CIPROFLOXACIN HCL 500 MG PO TABS
500.0000 mg | ORAL_TABLET | Freq: Every day | ORAL | 0 refills | Status: AC
  Filled 2024-01-18: qty 7, 7d supply, fill #0

## 2024-01-18 MED ORDER — FLUCONAZOLE 100 MG PO TABS
100.0000 mg | ORAL_TABLET | Freq: Every day | ORAL | 0 refills | Status: DC
  Filled 2024-01-18: qty 2, 2d supply, fill #0

## 2024-01-19 ENCOUNTER — Other Ambulatory Visit: Payer: Self-pay

## 2024-01-25 ENCOUNTER — Ambulatory Visit: Payer: Self-pay

## 2024-02-12 ENCOUNTER — Other Ambulatory Visit: Payer: Self-pay

## 2024-02-12 MED ORDER — OMEPRAZOLE 20 MG PO CPDR
20.0000 mg | DELAYED_RELEASE_CAPSULE | Freq: Every day | ORAL | 3 refills | Status: AC
  Filled 2024-02-12: qty 30, 30d supply, fill #0

## 2024-02-19 ENCOUNTER — Other Ambulatory Visit: Payer: Self-pay

## 2024-02-19 MED ORDER — OMEPRAZOLE 20 MG PO CPDR
20.0000 mg | DELAYED_RELEASE_CAPSULE | Freq: Every day | ORAL | 3 refills | Status: AC
  Filled 2024-02-19: qty 30, 30d supply, fill #0

## 2024-02-29 ENCOUNTER — Other Ambulatory Visit: Payer: Self-pay

## 2024-02-29 MED ORDER — CIPROFLOXACIN HCL 500 MG PO TABS
500.0000 mg | ORAL_TABLET | Freq: Every day | ORAL | 0 refills | Status: AC
  Filled 2024-02-29: qty 7, 7d supply, fill #0

## 2024-02-29 MED ORDER — CIPROFLOXACIN HCL 500 MG PO TABS: ORAL_TABLET | ORAL | 0 refills | Status: AC

## 2024-03-07 ENCOUNTER — Other Ambulatory Visit: Payer: Self-pay

## 2024-03-07 MED ORDER — CIPROFLOXACIN HCL 250 MG PO TABS
500.0000 mg | ORAL_TABLET | Freq: Every day | ORAL | 0 refills | Status: AC
  Filled 2024-03-07: qty 14, 7d supply, fill #0

## 2024-03-07 MED ORDER — FLUCONAZOLE 100 MG PO TABS
100.0000 mg | ORAL_TABLET | Freq: Every day | ORAL | 0 refills | Status: DC
  Filled 2024-03-07: qty 2, 2d supply, fill #0

## 2024-03-08 ENCOUNTER — Other Ambulatory Visit: Payer: Self-pay

## 2024-03-14 ENCOUNTER — Encounter: Payer: Self-pay | Admitting: Gerontology

## 2024-03-14 ENCOUNTER — Ambulatory Visit: Payer: Self-pay | Admitting: Gerontology

## 2024-03-14 ENCOUNTER — Other Ambulatory Visit: Payer: Self-pay

## 2024-03-14 VITALS — BP 152/81 | HR 75 | Temp 98.2°F | Ht 61.0 in | Wt 272.0 lb

## 2024-03-14 DIAGNOSIS — R059 Cough, unspecified: Secondary | ICD-10-CM | POA: Insufficient documentation

## 2024-03-14 DIAGNOSIS — R051 Acute cough: Secondary | ICD-10-CM

## 2024-03-14 DIAGNOSIS — E1122 Type 2 diabetes mellitus with diabetic chronic kidney disease: Secondary | ICD-10-CM

## 2024-03-14 DIAGNOSIS — J029 Acute pharyngitis, unspecified: Secondary | ICD-10-CM | POA: Insufficient documentation

## 2024-03-14 LAB — GLUCOSE, POCT (MANUAL RESULT ENTRY): POC Glucose: 428 mg/dL — AB (ref 70–99)

## 2024-03-14 MED ORDER — BASAGLAR KWIKPEN 100 UNIT/ML ~~LOC~~ SOPN
10.0000 [IU] | PEN_INJECTOR | Freq: Every day | SUBCUTANEOUS | 0 refills | Status: AC
  Filled 2024-03-14: qty 15, 150d supply, fill #0

## 2024-03-14 MED ORDER — SALINE SPRAY 0.65 % NA SOLN
1.0000 | NASAL | 0 refills | Status: AC | PRN
  Filled 2024-03-14: qty 30, 30d supply, fill #0

## 2024-03-14 MED ORDER — BENZONATATE 100 MG PO CAPS
100.0000 mg | ORAL_CAPSULE | Freq: Three times a day (TID) | ORAL | 0 refills | Status: AC | PRN
  Filled 2024-03-14: qty 20, 7d supply, fill #0

## 2024-03-14 MED ORDER — SORE THROAT LOZENGES 15-3.6 MG MT LOZG
1.0000 | LOZENGE | Freq: Every day | OROMUCOSAL | 0 refills | Status: AC | PRN
  Filled 2024-03-14 (×2): qty 16, 16d supply, fill #0

## 2024-03-14 NOTE — Progress Notes (Signed)
 Established Patient Office Visit  Subjective   Patient ID: [name], female    DOB: 1975-06-12  Age: 48 y.o. MRN: [id number]  Chief Complaint  Patient presents with   Chills   Cough   Nasal Congestion    HPI  [name]   is a 48 year old female who history of Asthma,T2 Diabetes, Hypertension, Renal disorder on hemodialysis who presents for follow up visit. She misses her follow up appointments. She is taking Ciprofloxacin  for UTI. She states that she's been sick for 3 weeks an it's worsening, c/o sore throat, post nasal drip, productive cough with greenish phlegm, states that she has fever 3 days ago, didn't check temperature but took tylenol .  She states that cough keeps her up at night, She has left arm fistula, and dialyzes TTHS. Her blood glucose was 428 mg/dl, she states that she has not checked her blood glucose in 2 days, states that she ate cookies prior to clinic visit. Overall, she is not in acute distress that requires urgent treatment.     Review of Systems  Constitutional:  Positive for fever and malaise/fatigue.  HENT:  Positive for congestion and sore throat.   Respiratory:  Positive for cough.   Cardiovascular: Negative.   Skin: Negative.       Objective:     BP (!) 152/81 (BP Location: Right Arm, Patient Position: Sitting, Cuff Size: Large)   Pulse 75   Temp 98.2 F (36.8 C) (Oral)   Ht 5' 1 (1.549 m)   Wt 272 lb (123.4 kg)   LMP 02/22/2024   SpO2 96%   BMI 51.39 kg/m  BP Readings from Last 3 Encounters:  03/14/24 (!) 152/81  01/04/24 (!) 148/83  01/04/24 (!) 167/82   Wt Readings from Last 3 Encounters:  03/14/24 272 lb (123.4 kg)  01/04/24 270 lb 12.8 oz (122.8 kg)  01/04/24 270 lb (122.5 kg)      Physical Exam HENT:     Head: Normocephalic and atraumatic.     Right Ear: Hearing and ear canal normal. No drainage or tenderness. No middle ear effusion. Tympanic membrane is not erythematous.     Left Ear: Hearing and ear  canal normal. No drainage or tenderness.  No middle ear effusion. Tympanic membrane is not erythematous.     Nose: Nose normal. No nasal deformity, septal deviation, nasal tenderness, mucosal edema, congestion or rhinorrhea.     Right Sinus: No maxillary sinus tenderness or frontal sinus tenderness.     Left Sinus: No maxillary sinus tenderness or frontal sinus tenderness.     Mouth/Throat:     Lips: Pink.     Mouth: Mucous membranes are moist.     Palate: No mass and lesions.     Pharynx: Oropharynx is clear. Uvula midline.     Tonsils: No tonsillar exudate or tonsillar abscesses.  Eyes:     Extraocular Movements: Extraocular movements intact.     Pupils: Pupils are equal, round, and reactive to light.  Cardiovascular:     Rate and Rhythm: Normal rate and regular rhythm.     Pulses: Normal pulses.     Heart sounds: Normal heart sounds.  Pulmonary:     Effort: Pulmonary effort is normal.     Breath sounds: Normal breath sounds.  Skin:    General: Skin is warm.  Neurological:     General: No focal deficit present.     Mental Status: She is alert and oriented to person, place,  and time.  Psychiatric:        Mood and Affect: Mood normal.        Behavior: Behavior normal.      Results for orders placed or performed in visit on 03/14/24  CBC w/Diff  Result Value Ref Range   WBC 8.1 3.4 - 10.8 x10E3/uL   RBC 2.82 (L) 3.77 - 5.28 x10E6/uL   Hemoglobin 8.6 (L) 11.1 - 15.9 g/dL   Hematocrit 73.4 (L) 65.9 - 46.6 %   MCV 94 79 - 97 fL   MCH 30.5 26.6 - 33.0 pg   MCHC 32.5 31.5 - 35.7 g/dL   RDW 87.4 88.2 - 84.5 %   Platelets 301 150 - 450 x10E3/uL   Neutrophils 76 Not Estab. %   Lymphs 14 Not Estab. %   Monocytes 5 Not Estab. %   Eos 3 Not Estab. %   Basos 1 Not Estab. %   Neutrophils Absolute 6.2 1.4 - 7.0 x10E3/uL   Lymphocytes Absolute 1.1 0.7 - 3.1 x10E3/uL   Monocytes Absolute 0.4 0.1 - 0.9 x10E3/uL   EOS (ABSOLUTE) 0.2 0.0 - 0.4 x10E3/uL   Basophils Absolute 0.0 0.0 -  0.2 x10E3/uL   Immature Granulocytes 0 Not Estab. %   Immature Grans (Abs) 0.0 0.0 - 0.1 x10E3/uL  POCT Glucose (CBG)  Result Value Ref Range   POC Glucose 428 (A) 70 - 99 mg/dl    Last CBC Lab Results  Component Value Date   WBC 8.1 03/14/2024   HGB 8.6 (L) 03/14/2024   HCT 26.5 (L) 03/14/2024   MCV 94 03/14/2024   MCH 30.5 03/14/2024   RDW 12.5 03/14/2024   PLT 301 03/14/2024   Last metabolic panel Lab Results  Component Value Date   GLUCOSE 399 (H) 01/04/2024   NA 135 01/04/2024   K 4.4 01/04/2024   CL 91 (L) 01/04/2024   CO2 25 01/04/2024   BUN 26 (H) 01/04/2024   CREATININE 6.53 (H) 01/04/2024   EGFR 7 (L) 01/04/2024   CALCIUM  9.4 01/04/2024   PHOS 3.8 07/31/2021   PROT 6.8 01/04/2024   ALBUMIN  4.2 01/04/2024   LABGLOB 2.6 01/04/2024   BILITOT <0.2 01/04/2024   ALKPHOS 102 01/04/2024   AST 12 01/04/2024   ALT 12 01/04/2024   ANIONGAP 12 08/25/2021   Last lipids Lab Results  Component Value Date   CHOL 183 03/10/2021   HDL 46 03/10/2021   LDLCALC 109 (H) 03/10/2021   TRIG 162 (H) 03/10/2021   CHOLHDL 4.0 03/10/2021   Last hemoglobin A1c Lab Results  Component Value Date   HGBA1C 10.3 (A) 01/04/2024   Last thyroid  functions Lab Results  Component Value Date   TSH 3.921 08/29/2020   Last vitamin D No results found for: 25OHVITD2, 25OHVITD3, VD25OH Last vitamin B12 and Folate No results found for: VITAMINB12, FOLATE    The 10-year ASCVD risk score (Arnett DK, et al., 2019) is: 4.9%    Assessment & Plan:   1. Acute cough (Primary) - WBC was normal, was started on Tessalon Perles,  advised to notify clinic for worsening symptoms. - CBC w/Diff; Future - benzonatate (TESSALON PERLES) 100 MG capsule; Take 1 capsule (100 mg total) by mouth 3 (three) times daily as needed.  Dispense: 20 capsule; Refill: 0 - CBC w/Diff  2. Sore throat - COVID, Influenza A and B was negative, agreed to take Lozenges as needed and use nasal saline. -  sodium chloride  (OCEAN) 0.65 % SOLN nasal spray; Place  1 spray into both nostrils as needed for congestion.  Dispense: 30 mL; Refill: 0 - Benzocaine-Menthol (SORE THROAT LOZENGES) 15-3.6 MG LOZG; Use as directed 1 lozenge in the mouth or throat daily as needed.  Dispense: 16 lozenge; Refill: 0  3. Type 2 diabetes mellitus with chronic kidney disease on chronic dialysis, unspecified whether long term insulin  use (HCC) - Her blood glucose was elevated during visit, increased Insulin  Basaglar  to 10 units, advised her to check her blood glucose tid, record and bring log to follow up appointment. - Insulin  Glargine (BASAGLAR  KWIKPEN) 100 UNIT/ML; Inject 10 Units into the skin at bedtime. If Blood glucose is > 400 mg/dl go to the E  Dispense: 15 mL; Refill: 0 - POCT Glucose (CBG); Future - POCT Glucose (CBG)   Return in about 2 weeks (around 03/28/2024), or if symptoms worsen or fail to improve.    Raveena Hebdon E Stephenie Navejas, NP

## 2024-03-14 NOTE — Patient Instructions (Signed)

## 2024-03-15 LAB — CBC WITH DIFFERENTIAL/PLATELET
Basophils Absolute: 0 x10E3/uL (ref 0.0–0.2)
Basos: 1 %
EOS (ABSOLUTE): 0.2 x10E3/uL (ref 0.0–0.4)
Eos: 3 %
Hematocrit: 26.5 % — ABNORMAL LOW (ref 34.0–46.6)
Hemoglobin: 8.6 g/dL — ABNORMAL LOW (ref 11.1–15.9)
Immature Grans (Abs): 0 x10E3/uL (ref 0.0–0.1)
Immature Granulocytes: 0 %
Lymphocytes Absolute: 1.1 x10E3/uL (ref 0.7–3.1)
Lymphs: 14 %
MCH: 30.5 pg (ref 26.6–33.0)
MCHC: 32.5 g/dL (ref 31.5–35.7)
MCV: 94 fL (ref 79–97)
Monocytes Absolute: 0.4 x10E3/uL (ref 0.1–0.9)
Monocytes: 5 %
Neutrophils Absolute: 6.2 x10E3/uL (ref 1.4–7.0)
Neutrophils: 76 %
Platelets: 301 x10E3/uL (ref 150–450)
RBC: 2.82 x10E6/uL — ABNORMAL LOW (ref 3.77–5.28)
RDW: 12.5 % (ref 11.7–15.4)
WBC: 8.1 x10E3/uL (ref 3.4–10.8)

## 2024-03-26 ENCOUNTER — Ambulatory Visit: Payer: Self-pay | Admitting: Gerontology

## 2024-04-04 ENCOUNTER — Other Ambulatory Visit: Payer: Self-pay

## 2024-04-04 MED ORDER — PREMARIN 0.625 MG/GM VA CREA
1.0000 | TOPICAL_CREAM | Freq: Two times a day (BID) | VAGINAL | 0 refills | Status: AC
  Filled 2024-04-04: qty 30, 8d supply, fill #0

## 2024-04-10 ENCOUNTER — Other Ambulatory Visit: Payer: Self-pay

## 2024-04-30 ENCOUNTER — Other Ambulatory Visit: Payer: Self-pay

## 2024-04-30 MED ORDER — HYDROXYZINE HCL 25 MG PO TABS
25.0000 mg | ORAL_TABLET | Freq: Two times a day (BID) | ORAL | 3 refills | Status: AC | PRN
  Filled 2024-04-30: qty 120, 60d supply, fill #0
  Filled 2024-06-05: qty 120, 60d supply, fill #1

## 2024-05-02 NOTE — Telephone Encounter (Signed)
 Error patient not seen

## 2024-05-06 ENCOUNTER — Other Ambulatory Visit: Payer: Self-pay

## 2024-05-23 ENCOUNTER — Other Ambulatory Visit: Payer: Self-pay

## 2024-05-23 MED ORDER — FLUCONAZOLE 100 MG PO TABS
100.0000 mg | ORAL_TABLET | Freq: Every day | ORAL | 0 refills | Status: AC
  Filled 2024-05-23: qty 2, 2d supply, fill #0

## 2024-06-05 ENCOUNTER — Other Ambulatory Visit: Payer: Self-pay
